# Patient Record
Sex: Male | Born: 1945 | Race: White | Hispanic: No | Marital: Married | Smoking: Never smoker
Health system: Southern US, Community
[De-identification: ages and names within clinical notes are randomized; demographics above are authoritative.]

## PROBLEM LIST (undated history)

## (undated) ENCOUNTER — Emergency Department (HOSPITAL_COMMUNITY): Disposition: A | Discharge status: Home / Self Care | Payer: Medicare Other

## (undated) DIAGNOSIS — E785 Hyperlipidemia, unspecified: Secondary | ICD-10-CM

## (undated) DIAGNOSIS — M549 Dorsalgia, unspecified: Secondary | ICD-10-CM

## (undated) DIAGNOSIS — I2510 Atherosclerotic heart disease of native coronary artery without angina pectoris: Secondary | ICD-10-CM

## (undated) DIAGNOSIS — G8929 Other chronic pain: Secondary | ICD-10-CM

## (undated) DIAGNOSIS — K219 Gastro-esophageal reflux disease without esophagitis: Secondary | ICD-10-CM

## (undated) DIAGNOSIS — C801 Malignant (primary) neoplasm, unspecified: Secondary | ICD-10-CM

## (undated) DIAGNOSIS — Z87440 Personal history of urinary (tract) infections: Secondary | ICD-10-CM

## (undated) DIAGNOSIS — E039 Hypothyroidism, unspecified: Secondary | ICD-10-CM

## (undated) DIAGNOSIS — M1990 Unspecified osteoarthritis, unspecified site: Secondary | ICD-10-CM

## (undated) DIAGNOSIS — R42 Dizziness and giddiness: Secondary | ICD-10-CM

## (undated) DIAGNOSIS — I10 Essential (primary) hypertension: Secondary | ICD-10-CM

## (undated) DIAGNOSIS — K649 Unspecified hemorrhoids: Secondary | ICD-10-CM

## (undated) DIAGNOSIS — F419 Anxiety disorder, unspecified: Secondary | ICD-10-CM

## (undated) DIAGNOSIS — R351 Nocturia: Secondary | ICD-10-CM

## (undated) DIAGNOSIS — Z87898 Personal history of other specified conditions: Secondary | ICD-10-CM

## (undated) DIAGNOSIS — I219 Acute myocardial infarction, unspecified: Secondary | ICD-10-CM

## (undated) DIAGNOSIS — M62838 Other muscle spasm: Secondary | ICD-10-CM

## (undated) DIAGNOSIS — Z87442 Personal history of urinary calculi: Secondary | ICD-10-CM

## (undated) DIAGNOSIS — K5900 Constipation, unspecified: Secondary | ICD-10-CM

## (undated) DIAGNOSIS — I447 Left bundle-branch block, unspecified: Secondary | ICD-10-CM

## (undated) DIAGNOSIS — W19XXXA Unspecified fall, initial encounter: Secondary | ICD-10-CM

## (undated) DIAGNOSIS — F32A Depression, unspecified: Secondary | ICD-10-CM

## (undated) DIAGNOSIS — H269 Unspecified cataract: Secondary | ICD-10-CM

## (undated) DIAGNOSIS — E119 Type 2 diabetes mellitus without complications: Secondary | ICD-10-CM

## (undated) DIAGNOSIS — N189 Chronic kidney disease, unspecified: Secondary | ICD-10-CM

## (undated) DIAGNOSIS — J40 Bronchitis, not specified as acute or chronic: Secondary | ICD-10-CM

## (undated) HISTORY — PX: CORONARY ANGIOPLASTY: SHX604

## (undated) HISTORY — PX: COLONOSCOPY: SHX174

## (undated) HISTORY — PX: OTHER SURGICAL HISTORY: SHX169

## (undated) HISTORY — PX: TONSILLECTOMY: SUR1361

---

## 2000-08-13 ENCOUNTER — Encounter: Payer: Self-pay | Admitting: Emergency Medicine

## 2000-08-13 ENCOUNTER — Emergency Department (HOSPITAL_COMMUNITY): Admission: EM | Admit: 2000-08-13 | Discharge: 2000-08-13 | Payer: Self-pay | Admitting: Emergency Medicine

## 2000-11-15 HISTORY — PX: CORONARY ARTERY BYPASS GRAFT: SHX141

## 2001-02-23 ENCOUNTER — Encounter: Admission: RE | Admit: 2001-02-23 | Discharge: 2001-04-05 | Payer: Self-pay | Admitting: Internal Medicine

## 2001-04-21 ENCOUNTER — Encounter: Payer: Self-pay | Admitting: Cardiology

## 2001-04-21 ENCOUNTER — Inpatient Hospital Stay (HOSPITAL_COMMUNITY): Admission: EM | Admit: 2001-04-21 | Discharge: 2001-05-01 | Payer: Self-pay | Admitting: Emergency Medicine

## 2001-04-24 ENCOUNTER — Encounter: Payer: Self-pay | Admitting: Cardiology

## 2001-04-25 ENCOUNTER — Encounter: Payer: Self-pay | Admitting: Cardiology

## 2001-04-26 ENCOUNTER — Encounter: Payer: Self-pay | Admitting: Cardiothoracic Surgery

## 2001-04-27 ENCOUNTER — Encounter: Payer: Self-pay | Admitting: Cardiothoracic Surgery

## 2001-04-28 ENCOUNTER — Encounter: Payer: Self-pay | Admitting: Cardiothoracic Surgery

## 2001-04-29 ENCOUNTER — Encounter: Payer: Self-pay | Admitting: Cardiothoracic Surgery

## 2001-05-02 ENCOUNTER — Emergency Department (HOSPITAL_COMMUNITY): Admission: EM | Admit: 2001-05-02 | Discharge: 2001-05-02 | Payer: Self-pay | Admitting: Emergency Medicine

## 2001-05-02 ENCOUNTER — Encounter: Payer: Self-pay | Admitting: Emergency Medicine

## 2001-10-23 ENCOUNTER — Encounter: Admission: RE | Admit: 2001-10-23 | Discharge: 2001-11-06 | Payer: Self-pay | Admitting: Internal Medicine

## 2002-03-16 ENCOUNTER — Emergency Department (HOSPITAL_COMMUNITY): Admission: EM | Admit: 2002-03-16 | Discharge: 2002-03-16 | Payer: Self-pay | Admitting: Emergency Medicine

## 2002-03-16 ENCOUNTER — Encounter: Payer: Self-pay | Admitting: Emergency Medicine

## 2002-05-17 ENCOUNTER — Encounter: Payer: Self-pay | Admitting: Cardiology

## 2002-05-17 ENCOUNTER — Ambulatory Visit (HOSPITAL_COMMUNITY): Admission: RE | Admit: 2002-05-17 | Discharge: 2002-05-18 | Payer: Self-pay | Admitting: Cardiology

## 2002-05-17 HISTORY — PX: CARDIAC CATHETERIZATION: SHX172

## 2002-07-02 ENCOUNTER — Ambulatory Visit (HOSPITAL_COMMUNITY): Admission: RE | Admit: 2002-07-02 | Discharge: 2002-07-02 | Payer: Self-pay | Admitting: *Deleted

## 2002-07-02 ENCOUNTER — Encounter (INDEPENDENT_AMBULATORY_CARE_PROVIDER_SITE_OTHER): Payer: Self-pay

## 2002-07-03 ENCOUNTER — Encounter: Payer: Self-pay | Admitting: Internal Medicine

## 2002-07-03 ENCOUNTER — Encounter: Admission: RE | Admit: 2002-07-03 | Discharge: 2002-07-03 | Payer: Self-pay | Admitting: Internal Medicine

## 2002-09-13 ENCOUNTER — Ambulatory Visit (HOSPITAL_COMMUNITY): Admission: RE | Admit: 2002-09-13 | Discharge: 2002-09-13 | Payer: Self-pay | Admitting: *Deleted

## 2003-03-15 ENCOUNTER — Encounter: Payer: Self-pay | Admitting: Internal Medicine

## 2003-03-15 ENCOUNTER — Encounter: Admission: RE | Admit: 2003-03-15 | Discharge: 2003-03-15 | Payer: Self-pay | Admitting: Internal Medicine

## 2005-02-25 ENCOUNTER — Emergency Department (HOSPITAL_COMMUNITY): Admission: EM | Admit: 2005-02-25 | Discharge: 2005-02-25 | Payer: Self-pay | Admitting: Emergency Medicine

## 2009-06-26 ENCOUNTER — Encounter: Admission: RE | Admit: 2009-06-26 | Discharge: 2009-06-26 | Payer: Self-pay | Admitting: Internal Medicine

## 2009-08-12 ENCOUNTER — Ambulatory Visit: Admission: RE | Admit: 2009-08-12 | Discharge: 2009-10-06 | Payer: Self-pay | Admitting: Radiation Oncology

## 2009-10-15 HISTORY — PX: PROSTATECTOMY: SHX69

## 2009-11-13 ENCOUNTER — Encounter (INDEPENDENT_AMBULATORY_CARE_PROVIDER_SITE_OTHER): Payer: Self-pay | Admitting: Urology

## 2009-11-13 ENCOUNTER — Ambulatory Visit (HOSPITAL_COMMUNITY): Admission: RE | Admit: 2009-11-13 | Discharge: 2009-11-14 | Payer: Self-pay | Admitting: Urology

## 2009-11-18 ENCOUNTER — Emergency Department (HOSPITAL_COMMUNITY): Admission: EM | Admit: 2009-11-18 | Discharge: 2009-11-19 | Payer: Self-pay | Admitting: Emergency Medicine

## 2011-01-31 LAB — CBC
HCT: 36.9 % — ABNORMAL LOW (ref 39.0–52.0)
Hemoglobin: 12.3 g/dL — ABNORMAL LOW (ref 13.0–17.0)
MCHC: 33.4 g/dL (ref 30.0–36.0)
MCV: 93.7 fL (ref 78.0–100.0)
Platelets: 187 10*3/uL (ref 150–400)
RBC: 3.94 MIL/uL — ABNORMAL LOW (ref 4.22–5.81)
RDW: 13.6 % (ref 11.5–15.5)
WBC: 5.3 10*3/uL (ref 4.0–10.5)

## 2011-01-31 LAB — POCT I-STAT, CHEM 8
Glucose, Bld: 244 mg/dL — ABNORMAL HIGH (ref 70–99)
HCT: 38 % — ABNORMAL LOW (ref 39.0–52.0)
Hemoglobin: 12.9 g/dL — ABNORMAL LOW (ref 13.0–17.0)
Potassium: 4.4 mEq/L (ref 3.5–5.1)
Sodium: 137 mEq/L (ref 135–145)
TCO2: 19 mmol/L (ref 0–100)

## 2011-01-31 LAB — DIFFERENTIAL
Basophils Relative: 0 % (ref 0–1)
Eosinophils Absolute: 0.1 10*3/uL (ref 0.0–0.7)
Eosinophils Relative: 1 % (ref 0–5)
Monocytes Relative: 8 % (ref 3–12)
Neutrophils Relative %: 84 % — ABNORMAL HIGH (ref 43–77)

## 2011-02-01 ENCOUNTER — Encounter: Payer: BC Managed Care – PPO | Attending: Endocrinology

## 2011-02-01 DIAGNOSIS — Z713 Dietary counseling and surveillance: Secondary | ICD-10-CM | POA: Insufficient documentation

## 2011-02-01 DIAGNOSIS — E119 Type 2 diabetes mellitus without complications: Secondary | ICD-10-CM | POA: Insufficient documentation

## 2011-02-15 LAB — CBC
Hemoglobin: 13.9 g/dL (ref 13.0–17.0)
MCHC: 32.9 g/dL (ref 30.0–36.0)
Platelets: 171 10*3/uL (ref 150–400)
RDW: 14.2 % (ref 11.5–15.5)

## 2011-02-15 LAB — BASIC METABOLIC PANEL
BUN: 23 mg/dL (ref 6–23)
CO2: 24 mEq/L (ref 19–32)
CO2: 24 mEq/L (ref 19–32)
Calcium: 7.9 mg/dL — ABNORMAL LOW (ref 8.4–10.5)
Calcium: 8.3 mg/dL — ABNORMAL LOW (ref 8.4–10.5)
Chloride: 103 mEq/L (ref 96–112)
Creatinine, Ser: 1.58 mg/dL — ABNORMAL HIGH (ref 0.4–1.5)
GFR calc Af Amer: 46 mL/min — ABNORMAL LOW (ref >60)
GFR calc Af Amer: 54 mL/min — ABNORMAL LOW (ref >60)
GFR calc Af Amer: 57 mL/min — ABNORMAL LOW (ref >60)
GFR calc non Af Amer: 38 mL/min — ABNORMAL LOW (ref >60)
GFR calc non Af Amer: 45 mL/min — ABNORMAL LOW (ref >60)
GFR calc non Af Amer: 47 mL/min — ABNORMAL LOW (ref >60)
Glucose, Bld: 229 mg/dL — ABNORMAL HIGH (ref 70–99)
Potassium: 4.5 mEq/L (ref 3.5–5.1)
Potassium: 5.5 mEq/L — ABNORMAL HIGH (ref 3.5–5.1)
Sodium: 130 mEq/L — ABNORMAL LOW (ref 135–145)
Sodium: 135 mEq/L (ref 135–145)
Sodium: 136 mEq/L (ref 135–145)

## 2011-02-15 LAB — TYPE AND SCREEN
ABO/RH(D): A POS
Antibody Screen: NEGATIVE

## 2011-02-15 LAB — COMPREHENSIVE METABOLIC PANEL
Albumin: 4.1 g/dL (ref 3.5–5.2)
BUN: 33 mg/dL — ABNORMAL HIGH (ref 6–23)
Calcium: 9.3 mg/dL (ref 8.4–10.5)
Creatinine, Ser: 1.73 mg/dL — ABNORMAL HIGH (ref 0.4–1.5)
Glucose, Bld: 225 mg/dL — ABNORMAL HIGH (ref 70–99)
Potassium: 6.2 mEq/L — ABNORMAL HIGH (ref 3.5–5.1)
Total Protein: 6.9 g/dL (ref 6.0–8.3)

## 2011-02-15 LAB — HEMOGLOBIN AND HEMATOCRIT, BLOOD
HCT: 32.6 % — ABNORMAL LOW (ref 39.0–52.0)
HCT: 37.1 % — ABNORMAL LOW (ref 39.0–52.0)

## 2011-02-15 LAB — GLUCOSE, CAPILLARY
Glucose-Capillary: 148 mg/dL — ABNORMAL HIGH (ref 70–99)
Glucose-Capillary: 160 mg/dL — ABNORMAL HIGH (ref 70–99)
Glucose-Capillary: 184 mg/dL — ABNORMAL HIGH (ref 70–99)
Glucose-Capillary: 198 mg/dL — ABNORMAL HIGH (ref 70–99)
Glucose-Capillary: 211 mg/dL — ABNORMAL HIGH (ref 70–99)
Glucose-Capillary: 79 mg/dL (ref 70–99)

## 2011-02-15 LAB — ABO/RH: ABO/RH(D): A POS

## 2011-04-02 NOTE — H&P (Signed)
[place]. [facility]  Patient:    Jesus Young, Jesus Young.                  MRN: [id number] Adm. Date:  04/21/01 Attending:  Theodoro Parma. Francina Ames., M.D. Dictator:   Vassie Moment, R.N., A.C.N.P.                         History and Physical  DATE OF BIRTH:  [birth year]  CHIEF COMPLAINT:  Left hand and arm pain.  HISTORY OF PRESENT ILLNESS:  The patient has had chronic left arm and hand pain, mild to moderate.  He is uncertain about how long this has been going on, but certainly more than a year.  It does not occur with activity or at rest predictably and the patient is not awakened at night from sleep with this pain.  There are no associated symptoms such as nausea, sweating, or shortness of breath.  He had an exercise tolerance test today which showed an early double positive result with both EKG changes of at least 2 mm of depression and pain in his arm and across his back.  The patient will be admitted to [facility] through the emergency department for an urgent catheterization.  PAST MEDICAL HISTORY:  Significant for non-insulin-dependent diabetes mellitus, hypertension, gastroesophageal reflux disorder, irritable bowel syndrome, and benign prostatic hypertrophy.  He fell on the ice with some back injury about two years ago.  PAST SURGICAL HISTORY:  Significant for tonsillectomy, negative colonoscopy a few years ago.  ALLERGIES:  TETANUS VACCINE.  MEDICATIONS: 1. Zantac 300 mg one q.d. 2. Cardura 4 mg one q.h.s. 3. Phazyme one b.i.d. 4. Glucovance one at lunch time. 5. Glucophage 500 mg one at supper time.  FAMILY HISTORY:  Significant for his father having died at 98 from a myocardial infarction and his mother dying at 26 from congestive heart failure.  He has one sister alive and well and no children.  SOCIAL HISTORY:  The patient is married.  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  He is right-handed.  He  does not use alcohol.  He does not smoke.  He does not abuse drugs.  REVIEW OF SYSTEMS:  CONSTITUTIONAL:  The patient denies fevers, chills, recent change in weight, or swelling.  He sleeps fairly well.  He has sweating at night.  HEENT:  Eyes:  The patient denies diplopia, contacts, glaucoma, or cataracts.  He does have some blurring and wears glasses.  Ear, nose, mouth, an throat:  The patient denies deafness, tinnitus, rhinorrhea, sneezing, dysgeusia, sores in his mouth, or dentures.  CARDIOVASCULAR:  The patient has pain in his left arm from his elbow to his shoulder and across his upper back. He denies palpitations, dyspnea, paroxysmal nocturnal dyspnea, orthopnea, or claudication.  RESPIRATORY:  The patient denies coughing, sputum production, wheezing, or smoking.  GASTROINTESTINAL:  The patient denies dysphagia, nausea, vomiting, diarrhea, or constipation but he does have frequent indigestion with heartburn.  GENITOURINARY:  The patient denies dysuria, pyuria, hematuria, anuria, or frequency.  He does have hesitation.  He has nocturia x 0-1 at night.  MUSCULOSKELETAL:  The patient has pain in his shoulders, his elbows, and his upper back.  He has myalgias in the biceps of his left upper arm and leg cramps at night.  He does have some fatigue but his gait is steady.  SKIN:  The patient denies rashes or  other skin problems. BREASTS:  The patient denies lumps, tenderness, masses, or discharge in the breasts.  NEUROLOGIC:  The patient denies faintness, syncope, seizures, or the signs or symptoms of a stroke.  Occasionally his lips and fingers get numb and he does get dizzy with his diabetes if his blood sugar is low.  PSYCHIATRIC: The patient denies depression, anorexia, or hallucinations.  He is under a lot of stress.  ENDOCRINE:  The patient denies thyroid disease but he does have diabetes mellitus and occasionally has excessive thirst and hunger. HEMATOLOGIC:  The patient denies that  he bruises or bleeds easily.  LYMPHATIC: The patient denies lymphadenopathy of the neck, axillae, and groin.  ALLERGIC: The patient is allergic to TETANUS VACCINE and denies any other nondrug allergies.  PHYSICAL EXAMINATION  VITAL SIGNS:  The patient is afebrile.  Pulse 120, respirations 18, blood pressure 138/70, weight 162 pounds, height 6 feet 3 inches.  GENERAL:  The patient is a 65 year old well-developed, well-nourished white male in no acute distress.  HEENT:  Pupils are equal, round and reactive to light and 2 mm in diameter. He wears glasses.  He is normocephalic, atraumatic.  Mouth is moist. Oropharynx is benign.  NECK:  Supple.  Midline trachea.  He does not have jugular vein distention, bruit, thyromegaly, or hepatojugular reflux.  CHEST:  The patient is ______.  His respirations are clear to auscultation. Chest is clear to percussion.  BREASTS:  Normal contour without discharge or tenderness.  ADENOPATHY:  The patient is without cervical adenopathy.  CARDIAC:  Regular rate and rhythm.  S1 and S2 are clearly heard.  No murmurs, gallops, rubs, or clicks are auscultated.  ABDOMEN:  The patient does have bowel sounds throughout all quadrants.  He is not distended, not obese, and nontender.  GENITOURINARY:  The patient is nontender over his bladder.  EXTREMITIES:  The patient moves all extremities x 4.  His strength is 5/5 in his upper and lower extremities.  He is without ankle edema.  SKIN:  The patient is warm and dry.  He is without jaundice, cyanosis, pallor, or rashes.  He has a brisk capillary refill.  NEUROLOGIC:  The patient is conscious, alert, and oriented to person, place, time, and situation.  Cranial nerves 2-12 are grossly intact.  PSYCHIATRIC:  The patient is pleasant, cooperative, and responds appropriately.  PSYCHOSOCIAL:  The patient is accompanied to the hospital by his wife.  TEST RESULTS:  On April 21, 2001 the patient had a double  positive exercise tolerance test.  IMPRESSION: 1. Positive exercise tolerance test. 2. Non-insulin-dependent diabetes mellitus. 3. Hypertension.  4. Gastroesophageal reflux disorder.  PLAN: 1. Admit the patient through the emergency room to the catheterization    laboratory for an urgent catheterization. 2. Chest x-ray upon arrival. 3. PT, PTT, CBC, and CMET upon arrival at the hospital. 4. Aspirin 325 mg, Plavix 75 mg, Toprol 50 mg given at the office. 5. O2 and monitor in transport by ambulance to [facility]. DD:  04/22/01 TD:  04/23/01 Job: [id number]

## 2011-04-02 NOTE — Discharge Summary (Signed)
[place]. [facility]  Patient:    Jesus Young, Jesus Young                   MRN: [id number] Adm. Date:  [id number] Disc. Date: 05/01/01 Attending:  Len Childs Dictator:   Lynnell Catalan, P.A. CC:         Minette Brine. Glade Lloyd, M.D.  Len Childs, M.D.   Discharge Summary  DATE OF BIRTH:  [birth year]  ADMISSION DIAGNOSIS:  Coronary artery disease.  SECONDARY DIAGNOSES: 1. Non-insulin-dependent diabetes mellitus. 2. Hypertension. 3. Gastroesophageal reflux disease. 4. Postoperative anemia secondary to blood loss.  DISCHARGE DIAGNOSIS:  Coronary artery disease.  PROCEDURES: 1. Cardiac catheterization. 2. Bilateral carotid duplex. 3. Pre-CABG Dopplers. 4. Coronary bypass graft.  HOSPITAL COURSE:  Mr. Rostad was admitted to [facility] on April 21, 2001, at which time he underwent cardiac catheterization.  This cardiac catheterization revealed the patient had significant coronary artery disease requiring surgical intervention.  The patient was seen and evaluated by Dr. Prescott Gum.  The patient was evaluated by Dr. Prescott Gum, who discussed the risks and benefits of surgery with the patient.  The patient subsequently underwent on April 26, 2001, coronary bypass graft x 5 with left internal mammary artery anastomosed to the left anterior descending artery, left radial artery to the obtuse marginal artery, saphenous vein graft to the ramus artery, and a sequential saphenous vein graft to the posterior descending and posterolateral arteries.  As stated earlier, this was done by Dr. Prescott Gum. No complications were noted during the procedure.  Postoperatively the patient had a hospital course complicated by anemia secondary to blood loss.  This was treated with transfusion of packed red blood cells.  The patient was also started on iron postoperatively secondary to this anemia.  The remainder of the patients postoperative course was  uneventful.  He remained volume overload for several days after surgery.  This was treated with aggressive loop diuretics.  The patient was subsequently discharged home in stable condition on May 01, 2001.  DISCHARGE MEDICATIONS:  1. Tylox 1-2 tablets every four to six hours as needed for pain.  2. Glucophage 500 mg 1 tablet daily.  3. GlucoVance 1 tablet daily.  4. Aspirin 325 mg 1 tablet daily.  5. Toprol XL 25 mg 1 tablet daily.  6. Niferex 150 1 tablet twice daily.  7. Colace 100 mg 1 tablet twice daily.  8. Lasix 40 mg 1 tablet daily for seven days.  9. Potassium chloride 20 mEq 1 tablet daily for seven days. 10. Zantac 300 mg 1 tablet daily.  ACTIVITY:  The patient is to avoid driving, strenuous activity, or lifting heavy objects.  DIET:  1800 calorie ADA diet.  WOUND CARE:  The patient is told he could shower, clean the incisions with soap and water.  DISPOSITION:  Home.  FOLLOW-UP:  The patient was told to follow up with his cardiologist, Dr. Glade Lloyd, in two weeks.  He was instructed to follow up with Dr. Prescott Gum in three weeks.  Call and verify time and date of his appointment.  One hour before his appointment with Dr. Prescott Gum he was instructed to go to [facility] for chest x-ray. DD:  04/30/01 TD:  04/30/01 Job: [id number]

## 2011-04-02 NOTE — Consult Note (Signed)
[place]. [facility]  Patient:    Jesus Young, Jesus Young                   MRN: [id number] Proc. Date: 04/21/01 Adm. Date:  [id number] Attending:  Lisbeth Renshaw CC:         CVTS office  [facility] R. Glade Lloyd, M.D.  Theodoro Parma. Francina Ames., M.D.   Consultation Report  PHYSICIAN REQUESTING CONSULTATION:  Roe Rutherford, M.D.  PRIMARY CARE PHYSICIAN:  Dr. Conley Canal.  REASON FOR CONSULTATION:  Severe two-vessel coronary artery disease.  CHIEF COMPLAINT:  Upper back and shoulder pain.  HISTORY OF PRESENT ILLNESS:  I was asked to see Jesus Young in consultation by Dr. Roe Rutherford for evaluation and treatment of severe two-vessel coronary artery disease, documented by cardiac catheterization this afternoon. The patient is currently stable and comfortable in the catheterization laboratory holding area, where I reviewed the results of the cardiac catheterization with the patient and examined him and discussed the operation with his wife.  The patient has had an indefinite period of time with left shoulder and upper arm stiffness and pain and soreness of the upper back and lower neck.  This has been both at rest and exertional, and he did not relate it to any cardiac problems.  Due to a concern of his primary care physician over coronary disease with his diabetes and a positive family history for CAD, an elective stress test was performed at Dr. Jasmine Pang office today.  This was significantly positive for ischemia, and he was subsequently admitted through the emergency department and underwent urgent cardiac catheterization today by Dr. Roe Rutherford.  This demonstrated severe two-vessel coronary disease with total occlusion of the LAD, 95% stenosis on the right, and 80% of the circumflex vessel.  His ejection fraction was 45%, with left ventricular end-diastolic pressure of 14 mmHg.  The patient was placed on IV heparin after receiving  nitroglycerin, aspirin, and Plavix precatheterization.  The patient denies any prior known history of myocardial infarction, angina, arrhythmia, palpitations, or cardiac problems.  PAST MEDICAL HISTORY: 1. Type 2 diabetes mellitus, on oral medications for four years. 2. Hypertension. 3. BPH. 4. Irritable bowel syndrome.  CURRENT MEDICATIONS: 1. Zantac 200 mg q.d. 2. Cardura 1 tablet q.h.s. 3. Phazyme 1 tablet b.i.d. 4. GlucoVance 1.25/250 at lunchtime. 5. Glucophage 500 mg 1 at suppertime.  ALLERGIES:  TETANUS.  PAST SURGICAL HISTORY: 1. Tonsillectomy as a child. 2. Recent negative colonoscopy.  SOCIAL HISTORY:  The patient is married and works as a Government social research officer for [employer].  He does not smoke or drink alcohol.  FAMILY HISTORY:  His father had heart bypass surgery in his 79s, and there is also an uncle that has had heart bypass surgery.  There is a positive family history of diabetes.  REVIEW OF SYSTEMS:  The patient states he has had nosebleeds as a child, but none for his adult life.  He denies any any bleeding diathesis or easy bruisability.  The patient is right-hand dominant.  He fell on the ice two years ago, with some injury to his right lower leg, which improved with physical therapy, and he did not sustain any fracture.  He denies any history of TIA, CVA, DVT, or claudication.  He denies any problems with diabetic ulcer of his feet or skin.  He denies any diabetic eye problems.  He does not have any dental plates, and he does wear glasses.  Review  of systems is otherwise negative.  PHYSICAL EXAMINATION:  VITAL SIGNS:  Height 6 feet 3 inches, weight 165 pounds.  Blood pressure is 130/70, heart rate is 88 and regular.  He is afebrile, with respirations 18 and unlabored.  GENERAL:  Middle-aged, pleasant white male, comfortable in the catheterization laboratory holding area.  He is anxious to get out of bed.  HEENT:  Normocephalic, full  EOMs, good dentition, pharynx clear.  NECK:  Supple.  Without JVD, thyromegaly, mass, or carotid bruit.  LYMPHATIC:  No palpable adenopathy of the cervical, supraclavicular, or axillary regions.  LUNGS:  Clear to auscultation.  There are no thoracic deformities.  CARDIAC:  Regular rate and rhythm.  Without S3, gallop, murmur, or rub.  ABDOMEN:  Soft and nontender.  Without mass or organomegaly.  EXTREMITIES:  Without clubbing, swollen or tender joints, edema, or deformity. There are no diabetic foot ulcers.  Vascular exam reveals 2+ pulses bilaterally in the radial, ulnar, femoral, and pedal areas.  There is no venous insufficiency of the lower extremities.  RECTAL:  Deferred.  NEUROLOGIC:  Alert and oriented x 3.  With full motor function limited to his bed rest.  He has a dressing over the right groin at the catheterization site, which is without evidence of hematoma or swelling.  SKIN:  Warm, clear, and dry.  LABORATORY DATA:  I reviewed his coronary arteriograms and agree with Dr. Michelle Piper interpretation of severe two-vessel disease.  His precatheterization creatinine was normal, and his precatheterization hematocrit was 44%.  RECOMMENDATION AND PLAN:  The patient would benefit from surgical coronary revascularization with grafts to the LAD, diagonal, OM, and distal right and posterior descending.  I discussed the results of the heart catheterization with the patient and his wife, including indications and expected benefits of coronary bypass surgery.  We will plan on using a mammary artery, radial artery, and vein grafts to construct the bypasses.  I discussed the details of the operation, including the choice of conduit, the location of the surgical  incisions, the use of general anesthesia and cardiopulmonary bypass, and the expected hospital recovery.  I reviewed the risks associated the operation, including the risks of MI, CVA, bleeding, infection, and death.   He understands that there are alternatives to surgical therapy for his coronary artery disease.  He agrees to proceed with the operation as planned, which will be scheduled for the morning of April 26, 2001.  He plans on remaining in the hospital until then.  Thank you very much for this consultation. DD:  04/21/01 TD:  04/21/01 Job: [id number]

## 2011-04-02 NOTE — Cardiovascular Report (Signed)
[place]. [facility]  Patient:    Jesus Young, Jesus Young Visit Number: [id number] MRN: [id number]          Service Type: CAT Location: [id number] Attending Physician:  Lisbeth Renshaw Dictated by:   Minette Brine. Glade Lloyd, M.D. Proc. Date: 05/17/02 Admit Date:  05/17/2002 Discharge Date: 05/18/2002   CC:         [facility]   Cardiac Catheterization  PROCEDURES: 1. Left heart catheterization. 2. Coronary cineangiography. 3. Vein graft cineangiography. 4. Left internal mammary graft cineangiography. 5. Left ventricular cineangiography. 6. Angioplasty with primary stenting of the proximal left anterior descending    artery with a Cypher stent. 7. Perclose of the right femoral artery.  INDICATION FOR PROCEDURES:  This 65 year old male has a history of severe three-vessel coronary artery disease and underwent coronary artery bypass graft surgery on April 26, 2001.  After his surgery, he continued to have left arm and shoulder pain which was similar to the pain he had prior to his surgery, but he has returned to work full time and has been followed up in the office on a periodic basis.  He then developed several episodes of anterior chest pain associated with his arm pain, and a repeat Persantine Cardiolite study showed evidence of anteroseptal ischemia with reversible ischemia shown which was in the distribution of his first anterolateral branch and septal branch.  This area had correlation with his left anterior descending which initially had a proximal severe stenosis prior to the first diagonal and septal branch and then was totally occluded in the middle segment.  We knew that he had a left internal mammary graft to the mid and distal LAD and felt sure that he had been grafted to his first diagonal branch.  After review of his surgical procedure, however, we found that the first diagonal branch had not been grafted, and a  large ramus branch had been grafted.  We then discussed alternatives with the patient several weeks ago, and this week he had an increase in symptoms with increased chest pain and left arm pain on mild exertion.  He was then scheduled for catheterization on Monday; however, he had several episodes today, and we brought him to the hospital early for an urgent catheterization rather than waiting over the holiday and weekend.  PROCEDURE IN DETAIL:  After signing an informed consent, the patient was premedicated with 50 mg of Benadryl intravenously and brought to the cardiac catheterization lab.  His right groin was prepped and draped in a sterile fashion and anesthetized locally with 1% lidocaine.  A 6-French introducer sheath was inserted percutaneously into the right femoral artery, and 6-French, #4 Judkins coronary cathethers were used to make injections into the native coronary arteries.  The right coronary catheter was used to make injections into the two vein grafts, a left radial artery graft, and also into the internal mammary graft to the LAD.  A 6-French pigtail catheter was used to measure pressures in the left ventricle and aorta and to make midstream injections into the left ventricle.  After noting a critical stenosis in his proximal LAD in the segment prior to his first septal and diagonal branches which was then followed by total occlusion of the LAD, and also noting very good grafts and normal function of all of his grafts, we felt that this area involving his septal and diagonal branch closely correlated to the abnormality on his Cardiolite stress test.  After discussing this  finding with the patient, we then elected to proceed with stenting of the proximal LAD lesion.  We selected a 6-French JL4 guide catheter which was advanced to the root of the aorta.  After inserting a short high-torque floppy guidewire into the guide catheter, the tip was engaged in the ostium of the  left coronary artery, and the guidewire was advanced into the LAD.  After mild difficulty, it was directed through the proximal lesion in the LAD and positioned distally in the large septal branch.  After sizing the vessel and lesion, we selected a 3.0 x 13 mm Cypher stent deployment system which was advanced over the guidewire and positioned within the lesion.  The stent was then deployed with one inflation at 18 atmospheres for 40 seconds.  After the stent was deployed, the deployment balloon was removed, and injection again in the left coronary artery showed an excellent angiographic result with 0% residual lesion and no evidence for dissection or clot.  There was normal antegrade flow.  There was no evidence for compromise of the circumflex ramus branch or diagonal or septal branches.  There was very good antegrade flow into the diagonal and septal branches.  The patient tolerated the procedure well, and no complications were noted.  At the end of the procedure, the catheter and sheath were removed from the right femoral artery, and hemostasis was easily obtained with a Perclose closure system.  MEDICATIONS GIVEN:  Versed 1 mg IV x 2, Heparin 5000 units IV, nitroglycerin intracoronary 200 units in the left coronary artery, Integrilin IV drip per pharmacy protocol.  HEMODYNAMIC DATA:  Left ventricular pressure 145/3 to 15, aortic pressure 145/61 with a mean of 100.  Left ventricular ejection fraction 60%.  CINE FINDINGS:  CORONARY CINEANGIOGRAPHY:  Left coronary artery: The ostium and left main appear normal with only mild narrowing at its ostium.  Left anterior descending artery: The proximal LAD has plaque throughout its length with a mild narrowing right after the bifurcation.  There is an eccentric focal 95% stenosis in the proximal segment just before the takeoff of the large first septal branch and the first large diagonal branch.  Just distal to the takeoff of these two  branches, the LAD is then totally occluded.  The middle and distal segment fill by way of internal mammary graft.  The first anterolateral branch has a focal eccentric 40 to 50% stenosis in its proximal segment.  The large septal branch appears normal.  Circumflex coronary artery:  The circumflex has a 75% stenotic lesion in its proximal segment.  The middle and distal segments fill by way of the radial artery graft and also fill antegrade.  Ramus branch: The ramus branch is totally occluded at its origin and fills well by the vein graft.  Right coronary artery: The right coronary artery is totally occluded in its proximal segment.  The mid and distal segment fills by way of vein graft.  VEIN GRAFT CINEANGIOGRAPHY:  Right coronary artery vein graft: This vein graft appears normal with normal proximal and distal anastomotic sites and very good flow into the distal right coronary artery.  Ramus vein graft: The vein graft to the large ramus branch appears normal with normal anastomotic sites and very good flow into the ramus branch.  Left radial artery graft to the obtuse marginal:  This graft appears normal but small with a mild compromise in its distal anastomotic site.  There was very good flow through this small graft which filled retrograde the  entire left coronary system which filled through the left main coronary artery.  Left internal mammary artery graft to the LAD: The LIMA graft appears normal with very good distal anastomotic site in the mid LAD.  The segment of LAD just proximal to this anastomosis is severely diseased for a long segment which compromises several small anterolateral branches and one small septal branch.  LEFT VENTRICULAR CINEANGIOGRAM:  Left ventricular chamber size contractility appear normal.  ANGIOPLASTY CINE:  Cines taken during the angioplasty procedure shows proper positioning of the guidewire and stent deployment system.  Final  injections into the left coronary artery after stent deployment shows an excellent angiographic result with proper positioning of the stent and 0% residual lesion and normal antegrade flow.  There was no evidence for compromise of the side branches.  FINAL DIAGNOSES: 1. A 95% stenosis of the proximal left anterior descending artery resulting in    unprotected first diagonal and first large septal branches which were not    grafted during his surgery in June 2002. 2. Normal-appearing vein grafts to the ramus branch and right coronary artery. 3. Normal function of the left radial artery graft to the obtuse marginal    branch with a mild narrowing in the distal anastomosis. 4. Normal left internal mammary graft to the left anterior descending artery. 5. Normal left ventricular function. 6. Successful primary stenting of the proximal left anterior descending artery    lesion with a Cypher stent. 7. Successful Perclose of the right femoral artery.  DISPOSITION:  The patient was admitted to the [facility] for monitoring overnight and continuation of the Integrilin drip for 18 hours.  Will anticipate discharge in the a.m. Dictated by:   Minette Brine. Glade Lloyd, M.D. Attending Physician:  Lisbeth Renshaw DD:  05/17/02 TD:  05/21/02 Job: [id number]

## 2011-04-02 NOTE — Op Note (Signed)
   TNAMEDERRYN, APA                     ACCOUNT NO.:  [id number]   MEDICAL RECORD NO.:  [id number]                   PATIENT TYPE:  AMB   LOCATION:  ENDO                                 FACILITY:  [facility]   PHYSICIAN:  Waverly Ferrari, M.D.                 DATE OF BIRTH:  1946-01-20   DATE OF PROCEDURE:  DATE OF DISCHARGE:                                 OPERATIVE REPORT   PROCEDURE:  Upper endoscopy.   INDICATIONS FOR PROCEDURE:  Gastroesophageal reflux disease.   ANESTHESIA:  Demerol 50, Versed 4 mg.   DESCRIPTION OF PROCEDURE:  With the patient mildly sedated in the left  lateral decubitus position, the Olympus videoscopic end was inserted in the  mouth and passed under direct vision through the esophagus which appeared  normal, no evidence of Barrett's into the stomach. The fundus, body, antrum,  duodenal bulb and second portion of the duodenum were all visualized and  appeared normal. From this point, the endoscope was slowly withdrawn taking  circumferential views of the entire duodenal mucosa until we pulled back to  the stomach and placed the endoscope in retroflexion to view the stomach  from below. The endoscope was then straightened and withdrawn taking  circumferential views of the remaining gastric and esophageal mucosa  stopping only in the fundus of the stomach where some erythema was seen,  photographed and biopsied. Once removed, the endoscope was withdrawn taking  circumferential views of the remaining gastroesophageal mucosa. The  patient's vital signs and pulse oximeter remained stable. The patient  tolerated the procedure well without apparent complications.   FINDINGS:  Erythema of fundus and duodenal bulb. Await biopsy report. The  patient will call me for results and followup with me as an outpatient.  Proceed to colonoscopy as planned.                                               Waverly Ferrari, M.D.    GMO/MEDQ  D:  07/02/2002  T:  07/02/2002   Job:  [id number]

## 2011-04-02 NOTE — Op Note (Signed)
[place]. [facility]  Patient:    Jesus Young, Jesus Young                   MRN: [id number] Proc. Date: 04/26/01 Adm. Date:  [id number] Attending:  Len Childs                           Operative Report  PROCEDURE:  Coronary artery bypass grafting x 5 (left internal mammary artery to the left anterior descending artery, saphenous vein graft to ramus intermedius, left radial artery graft to obtuse marginal, sequential saphenous vein graft to right coronary artery and posterior descending).  PREOPERATIVE DIAGNOSIS:  Class III progressive angina with severe three-vessel coronary disease and a positive stress test.  POSTOPERATIVE DIAGNOSIS:  Class III progressive angina with severe three-vessel coronary disease and a positive stress test.  SURGEON:  Len Childs, M.D.  ASSISTANT:  Sheliah Hatch, P.A.-C.  ANESTHESIA:  General.  INDICATIONS:  The patient is a 65 year old Type 2 diabetic who presented with progressive decrease in exercise tolerance and exertional chest tightness.  A stress test was early positive, and he underwent cardiac catheterization by Dr. Roe Rutherford.  This demonstrated severe three-vessel coronary disease including total occlusion of the LAD, a 90% stenosis of the circumflex, an 80% stenosis of right coronary with ejection fraction of 45%.  He was referred for a surgical coronary revascularization due to his severe three-vessel disease. Prior to the operation, I discussed the indications and expected benefits of coronary bypass surgery with the patient and his wife.  I discussed the major aspects of the operation including the choice of conduit including the use of radial artery, the location of the surgical incisions, the use of general anesthesia and cardiopulmonary bypass, and the expected postoperative recovery.  I reviewed the risks associated with this operation to the patient including risks of MI, CV, bleeding,  infection, and death.  He understood the alternatives to surgical therapy for his coronary disease.  He agreed to proceed with the operation as planned under informed consent.  OPERATIVE FINDINGS:  The patients LAD was chronically occluded and was a suboptimal target for grafting.  The circumflex was high in the AV groove. The right coronary was a large dominant vessel.  The saphenous vein was of average to above average quality.  The radial artery was a nondiseased, good conduit.  DESCRIPTION OF PROCEDURE:  The patient was brought to the operating room and placed supine on the operating room table where general anesthesia was induced under invasive hemodynamic monitoring.  The chest, abdomen, legs, and left arm were prepped and draped as a sterile field.  First, the left radial artery was harvested as a free graft from the forearm after documenting a patent palmar arch with a normal distal pulse present in the radial artery after occlusion of the vessel.  The vessel was cannulated and flushed with papaverine and heparin, and the incision was closed.  A sternal incision was then made, and the vein was harvested from the right leg.  The internal mammary artery was harvested from the left side from its origin at the subclavian vessels and was a good vessel with excellent flow.  It was small, 1.5 to 1.8 mm in diameter. The patient was given a calculated dose of heparin, and the ACT was determined and documented to be therapeutic.  Pursestrings were placed in the ascending aorta and right atrium, and the patient was  cannulated and placed on bypass and cooled to 28 degrees.  The coronaries were identified, and the mammary artery, radial artery, and vein grafts were prepared for the distal anastomoses.  A cardioplegic cannula was placed.  As the aortic crossclamp was applied, 600 cc of cold blood cardioplegia was delivered to the aortic root with an immediate cardioplegic ______ the temperature  ______ less than than 12 degrees.  Topical icing and flush will be used to augment myocardial preservation, and a pericardial insulator pad was used to protect the left phrenic nerve.  The distal coronary anastomoses were then performed.  The first distal anastomosis was to the distal right coronary.  This is a 1.8-mm vessel with a proximal 80% stenosis, and a side branch of the vein was sewn side-to-side with a running 7-0 Prolene with good flow to the graft.  The second distal anastomosis was the continuation of this sequential vein graft to the posterior descending.  This is a 1.5-mm vessel with a proximal 70% stenosis at its origin, and the end of the vein was sewn end-to-side with a running 7-0 Prolene with good flow through the graft.  Cardioplegia was re-dosed through the vein.  The third distal anastomosis was the ramus intermedius which was a 1.5-mm vessel with a proximal 80% stenosis.  A reverse saphenous vein was sewn end-to-side with a running 7-0 Prolene.  There was good flow through the graft.  A fourth distal anastomosis was of the obtuse marginal which had a proximal 90% stenosis.  The left radial artery free graft was then sewn end-to-side with a running 8-0 Prolene, and there was good flow through the graft.  A fifth distal anastomosis was to the distal LAD which is totally occluded proximally.  The left internal mammary artery pedicle was brought through an opening created in the left lateral pericardium.  It was brought down on the LAD and sewn end-to-side with a running 8-0 Prolene.  There was good flow through the anastomosis with immediate rise in septal temperature after release of the pedicle clamp on the mammary artery.  The mammary pedicle was secured to the epicardium, and the aorta crossclamp was removed.  The heart resumed a spontaneous rhythm.  The patient was rewarmed and reperfused.  A partial occluding clamp was placed on the ascending aorta, and three  proximal anastomoses were placed using the radial artery as the superior proximal anastomosis followed by the two vein grafts.  The partial clamp was removed, and the grafts were perfused.  Each had excellent flow, and  hemostasis was documented at the proximal and distal sites.  The patient was rewarmed, and temporary pacing wires were applied.  The lungs reexpanded, and the ventilator was resumed.  The patient was weaned from bypass without difficulty.  Blood pressure and cardiac output were stable.  Protamine was administered, and the cannulas were removed.  The mediastinum was irrigated with warm antibiotic irrigation.  The leg incision was irrigated and closed in a standard fashion.  The pericardium was loosely reapproximated over the aorta and vein grafts.  The mediastinum was drained with two chest tubes, and the left pleural space was drained with a third chest tube which were brought out through separate incisions.  The sternum was reapproximated with interrupted steel wire.  The pectoralis fascia was closed with interrupted #1 Vicryl.  The subcutaneous and skin were closed with a running Vicryl, and sterile dressings were applied.  Total cardiopulmonary bypass time was 120 minutes with aorta crossclamp time of  80 minutes. DD:  04/26/01 TD:  04/26/01 Job: [id number]

## 2011-04-02 NOTE — Op Note (Signed)
   Jesus Young, Jesus Young                     ACCOUNT NO.:  [id number]   MEDICAL RECORD NO.:  [id number]                   PATIENT TYPE:  AMB   LOCATION:  ENDO                                 FACILITY:  [facility]   PHYSICIAN:  Waverly Ferrari, M.D.                 DATE OF BIRTH:  May 27, 1946   DATE OF PROCEDURE:  DATE OF DISCHARGE:                                 OPERATIVE REPORT   PROCEDURE:  Colonoscopy.   INDICATIONS FOR PROCEDURE:  Colon polyp.   ANESTHESIA:  Additional Demerol 25, Versed 2 mg.   DESCRIPTION OF PROCEDURE:  With the patient mildly sedated in the left  lateral decubitus position, a rectal exam was performed which was  unremarkable. Subsequently, the Olympus videoscopic colonoscope was inserted  in the rectum and passed under direct vision to the cecum, identified by the  ileocecal valve and appendiceal orifice both of which were photographed.  From this point, the colonoscope was slowly withdrawn taking circumferential  views of the entire colonic mucosa stopping only in the rectum which  appeared normal on direct and retroflexed viewed. The endoscope was  straightened and withdrawn. The patient's vital signs and pulse oximeter  remained stable. The patient tolerated the procedure well without apparent  complications.   FINDINGS:  Unremarkable examination.   PLAN:  Repeat in possibly five years.                                               Waverly Ferrari, M.D.    GMO/MEDQ  D:  07/02/2002  T:  07/02/2002  Job:  [id number]

## 2011-04-02 NOTE — Cardiovascular Report (Signed)
[facility]  Patient:    Jesus, Young                   MRN: [id number] Proc. Date: 04/21/01 Adm. Date:  [id number] Attending:  Lisbeth Renshaw CC:         Theodoro Parma. Francina Ames., M.D.  Cardiac Catheterization Laboratory   Cardiac Catheterization  REFERRING PHYSICIAN:  Theodoro Parma. Francina Ames., M.D.  PROCEDURES: 1. Left heart catheterization, emergent. 2. Coronary cineangiography. 3. Left internal mammary artery cineangiography. 4. Left ventricular cineangiography. 5. Abdominal aortogram. 6. Perclose of the right femoral artery.  INDICATIONS FOR PROCEDURE:  This 65 year old male presented to Dr. Conley Canal with recent acceleration in exertional chest discomfort and decreasing exertional tolerance.  He underwent a treadmill exercise test today in the office which was early double positive with marked 4 and 5 mm ST depression,. and severe anterior chest pain which was fairly difficult to relieve with sublingual nitroglycerin.  The chest pain was unrelieved after 3 or 4 sublingual nitroglycerin and an emergent cardiac catheterization was scheduled here at [facility].  He has a strong family history of coronary artery disease and peripheral vascular disease.  DESCRIPTION OF PROCEDURE:  After signing an informed consent, the patient was transported from Dr. Jasmine Pang office to the cardiac catheterization lab at [facility], where his right groin was prepped and draped in a sterile fashion.  His right groin was anesthetized locally with 1% lidocaine.  A 6 French introducer sheath was inserted percutaneously into the right femoral artery.  A 6 French #4 Judkins coronary catheters were used to make injections into the coronary arteries.  The right coronary catheter was used to make a mid stream injection into the left subclavian artery visualizing the left internal mammary artery.  A 6 French pigtail catheter was used to  measure pressures in the left ventricle and aorta and to make mid stream injections into the left ventricle and abdominal aorta.  The patient tolerated the procedure well and no complications were noted.  At the end of the procedure, the catheter and sheath were removed from the right femoral artery and hemostasis was easily obtained with a Perclose closure system.  MEDICATIONS GIVEN:  Versed 2 mg IV.  HEMODYNAMIC DATA:  Left ventricular pressure 126/0-16, aortic pressure 125/69 with a mean of 90.  Left ventricular ejection fraction 45%.  CINE FINDINGS:  CORONARY CINEANGIOGRAPHY:  Left coronary artery:  The ostium appears normal. The mid and distal segment in the left main appears to be narrowed with a tapering off the left main into bifurcation causing a 50% stenosis. This plaque extends into the bifurcation and LAD and circumflex.  Left anterior descending:  The proximal LAD has a concentric focal plaque causing a 70% stenosis.  This is followed by a large septal branch and a medium sized anterolateral branch.  Just distal to the first large septal branch the LAD is totally occluded and there is a long segment of nonvisualization.  The mid to distal segment and distal LAD fill by way of collaterals with fairly good filling showing a normal-appearing LAD at the apex.  A second anterolateral branch fills retrograde during injections into the right coronary artery.  Circumflex coronary artery:  The circumflex has a segmental narrowing in its proximal segment causing a 70-80% stenosis.  There is antegrade flow. The large first obtuse marginal branch has a focal 50% stenosis in its proximal segment.  The remainder of this large  obtuse marginal branch appears normal with a large bifurcation distal to the lesion.  Right coronary artery:  The right coronary artery is a large dominant vessel supplying the posterior descending and posterolateral circulation.  The ostium appears normal.   There is a focal eccentric critical stenosis of 95% in the middle segment between the right ventricular branch and the acute marginal branch.  The distal segment extending to the crux appears normal.  The takeoff of the posterior descending has a focal 50-60% stenosis.  Left internal mammary artery appears normal.  LEFT VENTRICULAR CINEANGIOGRAM:  The left ventricular chamber size appears normal.  The overall left ventricular contractility is mildly decreased with moderate apical hypokinesia.  The inferior segment and the anterobasilar segment has normal contractility.  The mitral and aortic valves appear normal.  The ejection fraction was measured at 45%.  ABDOMINAL AORTOGRAM:  Mid stream injection in the abdominal aorta revealed a normal-appearing abdominal aorta with normal renal arteries.  FINAL DIAGNOSES: 1. Severe critical three-vessel coronary artery disease with totally    occluded left anterior descending, 95% focal right coronary artery    lesion, 70-80% stenosis of the proximal circumflex and 50% distal    left main disease. 2. Normal left internal mammary artery. 3. Mild left ventricular dysfunction, ejection fraction 45% with apical    hypokinesia. 4. Normal mitral and aortic valves. 5. Normal abdominal aorta and renal arteries. 6. Successful Perclose of the right femoral artery.  DISPOSITION:  Will admit to the stepdown unit for further monitoring to stabilize him over the weekend for anticipated surgery on Monday.  Will contact [facility] regarding coronary artery bypass graft surgery.  I have explained this to the patient and he shows a very high level of understanding. DD:  04/21/01 TD:  04/22/01 Job: [id number]

## 2012-09-05 ENCOUNTER — Other Ambulatory Visit: Payer: Self-pay | Admitting: Physician Assistant

## 2012-10-26 ENCOUNTER — Other Ambulatory Visit: Payer: Self-pay | Admitting: Dermatology

## 2012-11-09 ENCOUNTER — Other Ambulatory Visit: Payer: Self-pay | Admitting: Physician Assistant

## 2013-01-22 ENCOUNTER — Telehealth (INDEPENDENT_AMBULATORY_CARE_PROVIDER_SITE_OTHER): Payer: Self-pay | Admitting: General Surgery

## 2013-01-22 NOTE — Telephone Encounter (Signed)
Faxed back to [place] to not Refill meds patient will need a apt

## 2013-02-20 ENCOUNTER — Other Ambulatory Visit: Payer: Self-pay | Admitting: Physician Assistant

## 2013-06-14 ENCOUNTER — Other Ambulatory Visit: Payer: Self-pay | Admitting: Orthopedic Surgery

## 2013-06-14 NOTE — Progress Notes (Signed)
Need orders in EPIC.  Surgery scheduled for 06/21/13.

## 2013-06-14 NOTE — H&P (Signed)
Jesus Young is an 67 y.o. male.   Chief Complaint: back and leg pain HPI: The patient is a 67 year old male who presents with back pain. The patient reports low back symptoms including pain, numbness (right foot) and right leg pain which began 4 week(s) ago in association with a change in activity (began after he increased his walking for exercise). The patient describes the severity of their symptoms as mild. Symptoms are exacerbated by sitting, while symptoms are not exacerbated by recumbency. Refractory to nonsteroidal anti-inflammatory drugs, opioid analgesics and muscle relaxants, corticosteroids.   PMH Prostate dz Prostate CA Skin CA Hypercholesterolemia HTN MI Nephrolithiasis DM type 2 CAD  PSH Tonsillectomy Abdominal prostatectomy Cardiac stents CABG   FH CHF (mother) CA (paternal grandfather) Heart Dz (father, paternal side)  Social History: never smoker, never alcohol  Allergies: tetanus toxoid  Review of Systems  Constitutional: Negative.   HENT: Negative.   Eyes: Negative.   Respiratory: Negative.   Cardiovascular: Negative.   Gastrointestinal: Negative.   Genitourinary: Negative.   Musculoskeletal: Positive for back pain.  Skin: Negative.   Neurological: Positive for sensory change and focal weakness.  Psychiatric/Behavioral: Negative.     There were no vitals taken for this visit. Physical Exam  Constitutional: He is oriented to person, place, and time. He appears well-developed and well-nourished. He appears distressed.  HENT:  Head: Normocephalic and atraumatic.  Eyes: Conjunctivae and EOM are normal. Pupils are equal, round, and reactive to light.  Neck: Normal range of motion. Neck supple.  Cardiovascular: Normal rate and regular rhythm.   Respiratory: Effort normal and breath sounds normal.  GI: Soft. Bowel sounds are normal.  Musculoskeletal:  On exam he is on moderately severe distress. He walks with an antalgic gait with a crutch.  Mood and affect are anxious. He is tender in the right proximal gluteus. He has limited flexion and extension. Straight leg raise produces buttock, thigh, and calf pain exacerbated with dorsal augmentation maneuver. He has EHL 4/5. Decreased sensation in the L5 dermatome.  Lumbar spine exam reveals no evidence of soft tissue swelling, no evidence of soft tissue swelling or deformity or skin ecchymosis. On palpation there is no tenderness of the lumbar spine. No flank pain with percussion. The abdomen is soft and nontender. Nontender over the trochanters. No cellulitis or lymphadenopathy.  Motor is 5/5 including tibialis anterior, plantarflexion, quadriceps and hamstrings. The patient is normoreflexic. There is no Babinski or clonus. The patient has good distal pulses. No DVT. No pain and normal range of motion without instability of the hips, knees and ankles.  Inspection of the cervical spine reveals a normal lordosis without evidence of paraspinous spasms or soft tissue swelling. Nontender to palpation. Full flexion, full extension, full left and right lateral rotation. Extension combined with lateral flexion does not reproduce pain. Negative impingement sign, negative secondary impingement sign of the shoulders. Negative Tinel's at median and ulnar nerves at the elbow. Negative carpal compression test at the wrists. Motor of the upper extremities is 5/5 including biceps, triceps, brachioradialis,wrist flexion, wrist extension, finger flexion, finger extension. Reflexes are normoreflexic. Sensory exam is intact to light touch. There is no Hoffmann sign. Nontender over the thoracic spine.    Neurological: He is oriented to person, place, and time. He has normal reflexes.  Skin: Skin is warm and dry.     Three view radiographs of the lumbar spine demonstrates spondylosis at 4-5 and 5-1. No instability in flexion and extension.  MRI from  7/22 demonstrates disc herniation paracentral to the  right with mass effect on the L5 nerve root right and left. He has bimodal disc herniation.  Assessment/Plan Severe L5 radiculopathy, myotomal weakness, dermatomal dysesthesia secondary to disc herniation at 4-5, refractory to rest and activity modifications  I had an extensive discussion with the patient concerning current pathology, relevant anatomy, and treatment options. Certainly in the presence of a neurocompressive lesion, neurological deficit, and significant pain, proceeding with lumbar decompression is appropriate.  I had an extensive discussion of the risks and benefits of lumbar decompression with the patient including bleeding, infection, damage to neurovascular structures, epidural fibrosis, CSF leakage requiring repair. We also discussed increase in pain, adjacent segment disease, recurrent disc herniation, need for future surgery including repeat decompression and/or fusion. We also discussed risks of postoperative hematoma, paralysis, anesthetic complications including DVT, PE, death, cardiopulmonary dysfunction. In addition, the perioperative and postoperative courses were discussed in detail including the rehabilitative time and return to functional activity and work. I provided the patient with an illustrated handout and utilized the appropriate surgical models.  However, he reports some slight improvement since last week. Therefore, we will have him see in physical therapy for a visit for McKenzie approach. If he has dramatic improvement in his symptoms we can hold off on surgical intervention. However, he would like to proceed with that at this point and it is certainly reasonable given his examination. If he is worse in the interim he is to call. Continue with Percocet and Robaxin. I would not recommend an epidural given that he is insulin dependent diabetic. In the interim we will proceed with preoperative clearance by his medical physician. I appreciate the kind referral  by Dr. Esmond Plants.  Plan microlumbar decompression L4-5  BISSELL, JACLYN M. for Dr. Tonita Cong 06/14/2013, 11:32 AM

## 2013-06-19 ENCOUNTER — Other Ambulatory Visit (HOSPITAL_COMMUNITY): Payer: BC Managed Care – PPO

## 2013-06-21 ENCOUNTER — Ambulatory Visit (HOSPITAL_COMMUNITY): Admission: RE | Admit: 2013-06-21 | Payer: Medicare Other | Source: Ambulatory Visit | Admitting: Specialist

## 2013-06-21 ENCOUNTER — Encounter (HOSPITAL_COMMUNITY): Admission: RE | Payer: Self-pay | Source: Ambulatory Visit

## 2013-06-21 SURGERY — LUMBAR LAMINECTOMY/DECOMPRESSION MICRODISCECTOMY 1 LEVEL
Anesthesia: General

## 2013-07-03 ENCOUNTER — Other Ambulatory Visit: Payer: Self-pay | Admitting: Neurosurgery

## 2013-07-10 ENCOUNTER — Encounter (HOSPITAL_COMMUNITY): Payer: Self-pay | Admitting: Pharmacy Technician

## 2013-07-12 ENCOUNTER — Encounter (HOSPITAL_COMMUNITY)
Admission: RE | Admit: 2013-07-12 | Discharge: 2013-07-12 | Disposition: A | Discharge status: Home / Self Care | Payer: Medicare Other | Source: Ambulatory Visit | Attending: Neurosurgery | Admitting: Neurosurgery

## 2013-07-12 ENCOUNTER — Encounter (HOSPITAL_COMMUNITY)
Admission: RE | Admit: 2013-07-12 | Discharge: 2013-07-12 | Disposition: A | Discharge status: Home / Self Care | Payer: Medicare Other | Source: Ambulatory Visit | Attending: Anesthesiology | Admitting: Anesthesiology

## 2013-07-12 ENCOUNTER — Encounter (HOSPITAL_COMMUNITY): Payer: Self-pay

## 2013-07-12 DIAGNOSIS — Z01812 Encounter for preprocedural laboratory examination: Secondary | ICD-10-CM | POA: Insufficient documentation

## 2013-07-12 DIAGNOSIS — Z01818 Encounter for other preprocedural examination: Secondary | ICD-10-CM | POA: Insufficient documentation

## 2013-07-12 HISTORY — DX: Other chronic pain: G89.29

## 2013-07-12 HISTORY — DX: Type 2 diabetes mellitus without complications: E11.9

## 2013-07-12 HISTORY — DX: Essential (primary) hypertension: I10

## 2013-07-12 HISTORY — DX: Constipation, unspecified: K59.00

## 2013-07-12 HISTORY — DX: Unspecified osteoarthritis, unspecified site: M19.90

## 2013-07-12 HISTORY — DX: Dorsalgia, unspecified: M54.9

## 2013-07-12 HISTORY — DX: Unspecified hemorrhoids: K64.9

## 2013-07-12 HISTORY — DX: Malignant (primary) neoplasm, unspecified: C80.1

## 2013-07-12 HISTORY — DX: Hypothyroidism, unspecified: E03.9

## 2013-07-12 HISTORY — DX: Personal history of urinary calculi: Z87.442

## 2013-07-12 HISTORY — DX: Hyperlipidemia, unspecified: E78.5

## 2013-07-12 HISTORY — DX: Other muscle spasm: M62.838

## 2013-07-12 HISTORY — DX: Unspecified cataract: H26.9

## 2013-07-12 HISTORY — DX: Atherosclerotic heart disease of native coronary artery without angina pectoris: I25.10

## 2013-07-12 HISTORY — DX: Acute myocardial infarction, unspecified: I21.9

## 2013-07-12 HISTORY — DX: Gastro-esophageal reflux disease without esophagitis: K21.9

## 2013-07-12 LAB — BASIC METABOLIC PANEL
BUN: 22 mg/dL (ref 6–23)
Calcium: 9.4 mg/dL (ref 8.4–10.5)
GFR calc Af Amer: 51 mL/min — ABNORMAL LOW (ref >90)
GFR calc non Af Amer: 44 mL/min — ABNORMAL LOW (ref >90)
Potassium: 4.9 mEq/L (ref 3.5–5.1)
Sodium: 139 mEq/L (ref 135–145)

## 2013-07-12 LAB — CBC
MCH: 31.7 pg (ref 26.0–34.0)
MCHC: 33.9 g/dL (ref 30.0–36.0)
RDW: 13.6 % (ref 11.5–15.5)

## 2013-07-12 LAB — SURGICAL PCR SCREEN
MRSA, PCR: NEGATIVE
Staphylococcus aureus: NEGATIVE

## 2013-07-12 NOTE — Progress Notes (Addendum)
Dr.Ganji is cardiologist with last visit a week ago-to request report  Stress test in Feb 2014-to request from [facility]  Echo to be requested from [facility] cath report in epic from 05/17/02  Medical MD is Dr.Ramachandry  EKG done a week ago-to be requested from DR.GAnji  No CXR in past yr

## 2013-07-12 NOTE — Pre-Procedure Instructions (Signed)
XANG BROADEN  07/12/2013   Your procedure is scheduled on:  Wed, Sept 3 @ 10:00 AM  Report to [facility] at 7:00 AM.  Call this number if you have problems the morning of surgery: [phone]   Remember:   Do not eat food or drink liquids after midnight.   Take these medicines the morning of surgery with A SIP OF WATER:Pain Pill(if needed) and Zantac(Ranitidine)               Stop taking your Aspirin,Plavix,Celebrex,and Robaxin.No Goody's,BC's,Aleve,Ibuprofen,Fish Oil,or any Herbal Medications                 Do not wear jewelry  Do not wear lotions, powders, or colognes. You may wear deodorant.  Men may shave face and neck.  Do not bring valuables to the hospital.  [facility] is not responsible                   for any belongings or valuables.  Contacts, dentures or bridgework may not be worn into surgery.  Leave suitcase in the car. After surgery it may be brought to your room.  For patients admitted to the hospital, checkout time is 11:00 AM the day of  discharge.     Special Instructions: Shower using CHG 2 nights before surgery and the night before surgery.  If you shower the day of surgery use CHG.  Use special wash - you have one bottle of CHG for all showers.  You should use approximately 1/3 of the bottle for each shower.   Please read over the following fact sheets that you were given: Pain Booklet, Coughing and Deep Breathing, MRSA Information and Surgical Site Infection Prevention

## 2013-07-13 NOTE — Progress Notes (Signed)
Requested echo, stress, office visit and ekg from Dr Irven Shelling office placed in chart.

## 2013-07-17 MED ORDER — CEFAZOLIN SODIUM-DEXTROSE 2-3 GM-% IV SOLR
2.0000 g | INTRAVENOUS | Status: AC
  Administered 2013-07-18: 2 g via INTRAVENOUS

## 2013-07-18 ENCOUNTER — Ambulatory Visit (HOSPITAL_COMMUNITY): Payer: Medicare Other | Admitting: Anesthesiology

## 2013-07-18 ENCOUNTER — Ambulatory Visit (HOSPITAL_COMMUNITY): Payer: Medicare Other

## 2013-07-18 ENCOUNTER — Encounter (HOSPITAL_COMMUNITY): Payer: Self-pay | Admitting: Anesthesiology

## 2013-07-18 ENCOUNTER — Encounter (HOSPITAL_COMMUNITY): Payer: Self-pay | Admitting: *Deleted

## 2013-07-18 ENCOUNTER — Observation Stay (HOSPITAL_COMMUNITY)
Admission: RE | Admit: 2013-07-18 | Discharge: 2013-07-19 | Disposition: A | Discharge status: Home / Self Care | Payer: Medicare Other | Source: Ambulatory Visit | Attending: Neurosurgery | Admitting: Neurosurgery

## 2013-07-18 ENCOUNTER — Encounter (HOSPITAL_COMMUNITY)
Admission: RE | Disposition: A | Discharge status: Home / Self Care | Payer: Self-pay | Source: Ambulatory Visit | Attending: Neurosurgery

## 2013-07-18 DIAGNOSIS — N289 Disorder of kidney and ureter, unspecified: Secondary | ICD-10-CM | POA: Insufficient documentation

## 2013-07-18 DIAGNOSIS — Z951 Presence of aortocoronary bypass graft: Secondary | ICD-10-CM | POA: Insufficient documentation

## 2013-07-18 DIAGNOSIS — M5137 Other intervertebral disc degeneration, lumbosacral region: Secondary | ICD-10-CM | POA: Insufficient documentation

## 2013-07-18 DIAGNOSIS — I252 Old myocardial infarction: Secondary | ICD-10-CM | POA: Insufficient documentation

## 2013-07-18 DIAGNOSIS — I2510 Atherosclerotic heart disease of native coronary artery without angina pectoris: Secondary | ICD-10-CM | POA: Insufficient documentation

## 2013-07-18 DIAGNOSIS — E039 Hypothyroidism, unspecified: Secondary | ICD-10-CM | POA: Insufficient documentation

## 2013-07-18 DIAGNOSIS — K219 Gastro-esophageal reflux disease without esophagitis: Secondary | ICD-10-CM | POA: Insufficient documentation

## 2013-07-18 DIAGNOSIS — I10 Essential (primary) hypertension: Secondary | ICD-10-CM | POA: Insufficient documentation

## 2013-07-18 DIAGNOSIS — M51379 Other intervertebral disc degeneration, lumbosacral region without mention of lumbar back pain or lower extremity pain: Secondary | ICD-10-CM | POA: Insufficient documentation

## 2013-07-18 DIAGNOSIS — M47817 Spondylosis without myelopathy or radiculopathy, lumbosacral region: Principal | ICD-10-CM | POA: Insufficient documentation

## 2013-07-18 DIAGNOSIS — M5126 Other intervertebral disc displacement, lumbar region: Secondary | ICD-10-CM | POA: Insufficient documentation

## 2013-07-18 HISTORY — PX: LUMBAR LAMINECTOMY/DECOMPRESSION MICRODISCECTOMY: SHX5026

## 2013-07-18 LAB — GLUCOSE, CAPILLARY
Glucose-Capillary: 141 mg/dL — ABNORMAL HIGH (ref 70–99)
Glucose-Capillary: 200 mg/dL — ABNORMAL HIGH (ref 70–99)
Glucose-Capillary: 216 mg/dL — ABNORMAL HIGH (ref 70–99)

## 2013-07-18 SURGERY — LUMBAR LAMINECTOMY/DECOMPRESSION MICRODISCECTOMY 1 LEVEL
Anesthesia: General | Site: Back | Laterality: Bilateral | Wound class: Clean

## 2013-07-18 MED ORDER — MENTHOL 3 MG MT LOZG: 1.0000 | LOZENGE | OROMUCOSAL | Status: DC | PRN

## 2013-07-18 MED ORDER — VECURONIUM BROMIDE 10 MG IV SOLR
INTRAVENOUS | Status: DC | PRN
  Administered 2013-07-18: 1 mg via INTRAVENOUS

## 2013-07-18 MED ORDER — HYDROCODONE-ACETAMINOPHEN 5-325 MG PO TABS
1.0000 | ORAL_TABLET | ORAL | Status: DC | PRN
  Administered 2013-07-18: 1 via ORAL
  Filled 2013-07-18: qty 2

## 2013-07-18 MED ORDER — LIDOCAINE-EPINEPHRINE 1 %-1:100000 IJ SOLN
INTRAMUSCULAR | Status: DC | PRN
  Administered 2013-07-18: 10 mL

## 2013-07-18 MED ORDER — PANTOPRAZOLE SODIUM 40 MG PO TBEC
40.0000 mg | DELAYED_RELEASE_TABLET | Freq: Every day | ORAL | Status: DC
  Administered 2013-07-18: 40 mg via ORAL
  Filled 2013-07-18: qty 1

## 2013-07-18 MED ORDER — INSULIN ASPART 100 UNIT/ML ~~LOC~~ SOLN
8.0000 [IU] | Freq: Every day | SUBCUTANEOUS | Status: DC
  Administered 2013-07-18: 8 [IU] via SUBCUTANEOUS

## 2013-07-18 MED ORDER — BISACODYL 10 MG RE SUPP: 10.0000 mg | Freq: Every day | RECTAL | Status: DC | PRN

## 2013-07-18 MED ORDER — SIMVASTATIN 40 MG PO TABS
40.0000 mg | ORAL_TABLET | Freq: Every evening | ORAL | Status: DC
  Administered 2013-07-18: 40 mg via ORAL
  Filled 2013-07-18 (×2): qty 1

## 2013-07-18 MED ORDER — INSULIN DETEMIR 100 UNIT/ML ~~LOC~~ SOLN
10.0000 [IU] | Freq: Every day | SUBCUTANEOUS | Status: DC
  Administered 2013-07-18: 10 [IU] via SUBCUTANEOUS
  Filled 2013-07-18 (×2): qty 0.1

## 2013-07-18 MED ORDER — FENTANYL CITRATE 0.05 MG/ML IJ SOLN
INTRAMUSCULAR | Status: DC | PRN
  Administered 2013-07-18: 100 ug via INTRAVENOUS
  Administered 2013-07-18: 50 ug via INTRAVENOUS

## 2013-07-18 MED ORDER — NEOSTIGMINE METHYLSULFATE 1 MG/ML IJ SOLN
INTRAMUSCULAR | Status: DC | PRN
  Administered 2013-07-18: 4 mg via INTRAVENOUS

## 2013-07-18 MED ORDER — ZOLPIDEM TARTRATE 5 MG PO TABS: 5.0000 mg | ORAL_TABLET | Freq: Every evening | ORAL | Status: DC | PRN

## 2013-07-18 MED ORDER — ONDANSETRON HCL 4 MG/2ML IJ SOLN: 4.0000 mg | Freq: Four times a day (QID) | INTRAMUSCULAR | Status: DC | PRN

## 2013-07-18 MED ORDER — ROCURONIUM BROMIDE 100 MG/10ML IV SOLN
INTRAVENOUS | Status: DC | PRN
  Administered 2013-07-18: 50 mg via INTRAVENOUS

## 2013-07-18 MED ORDER — FENTANYL CITRATE 0.05 MG/ML IJ SOLN
INTRAMUSCULAR | Status: DC | PRN
  Administered 2013-07-18: 100 ug via INTRAVENOUS

## 2013-07-18 MED ORDER — LEVOTHYROXINE SODIUM 75 MCG PO TABS
75.0000 ug | ORAL_TABLET | Freq: Every day | ORAL | Status: DC
  Administered 2013-07-19: 75 ug via ORAL
  Filled 2013-07-18: qty 1

## 2013-07-18 MED ORDER — MAGNESIUM HYDROXIDE 400 MG/5ML PO SUSP: 30.0000 mL | Freq: Every day | ORAL | Status: DC | PRN

## 2013-07-18 MED ORDER — SODIUM CHLORIDE 0.9 % IV SOLN: INTRAVENOUS | Status: DC

## 2013-07-18 MED ORDER — SODIUM CHLORIDE 0.9 % IJ SOLN
3.0000 mL | Freq: Two times a day (BID) | INTRAMUSCULAR | Status: DC
  Administered 2013-07-18: 3 mL via INTRAVENOUS

## 2013-07-18 MED ORDER — INSULIN DETEMIR 100 UNIT/ML ~~LOC~~ SOLN
16.0000 [IU] | Freq: Every day | SUBCUTANEOUS | Status: DC
  Administered 2013-07-19: 16 [IU] via SUBCUTANEOUS
  Filled 2013-07-18: qty 0.16

## 2013-07-18 MED ORDER — THROMBIN 5000 UNITS EX SOLR
CUTANEOUS | Status: DC | PRN
  Administered 2013-07-18 (×2): 5000 [IU] via TOPICAL

## 2013-07-18 MED ORDER — ONDANSETRON HCL 4 MG/2ML IJ SOLN
INTRAMUSCULAR | Status: DC | PRN
  Administered 2013-07-18: 4 mg via INTRAVENOUS

## 2013-07-18 MED ORDER — PHENOL 1.4 % MT LIQD: 1.0000 | OROMUCOSAL | Status: DC | PRN

## 2013-07-18 MED ORDER — FENTANYL CITRATE 0.05 MG/ML IJ SOLN
INTRAMUSCULAR | Status: AC
  Filled 2013-07-18: qty 2

## 2013-07-18 MED ORDER — ACETAMINOPHEN 325 MG PO TABS: 650.0000 mg | ORAL_TABLET | ORAL | Status: DC | PRN

## 2013-07-18 MED ORDER — PHENYLEPHRINE HCL 10 MG/ML IJ SOLN
INTRAMUSCULAR | Status: DC | PRN
  Administered 2013-07-18: 40 ug via INTRAVENOUS

## 2013-07-18 MED ORDER — ALUM & MAG HYDROXIDE-SIMETH 200-200-20 MG/5ML PO SUSP: 30.0000 mL | Freq: Four times a day (QID) | ORAL | Status: DC | PRN

## 2013-07-18 MED ORDER — CYCLOBENZAPRINE HCL 10 MG PO TABS
10.0000 mg | ORAL_TABLET | Freq: Three times a day (TID) | ORAL | Status: DC | PRN
  Administered 2013-07-18: 10 mg via ORAL
  Filled 2013-07-18: qty 1

## 2013-07-18 MED ORDER — LACTATED RINGERS IV SOLN
INTRAVENOUS | Status: DC | PRN
  Administered 2013-07-18: 10:00:00 via INTRAVENOUS

## 2013-07-18 MED ORDER — OXYCODONE HCL 5 MG PO TABS: 5.0000 mg | ORAL_TABLET | Freq: Once | ORAL | Status: DC | PRN

## 2013-07-18 MED ORDER — FAMOTIDINE 20 MG PO TABS
20.0000 mg | ORAL_TABLET | Freq: Two times a day (BID) | ORAL | Status: DC
  Administered 2013-07-18: 20 mg via ORAL
  Filled 2013-07-18 (×3): qty 1

## 2013-07-18 MED ORDER — SODIUM CHLORIDE 0.9 % IJ SOLN: 3.0000 mL | INTRAMUSCULAR | Status: DC | PRN

## 2013-07-18 MED ORDER — HYDROXYZINE HCL 25 MG PO TABS: 50.0000 mg | ORAL_TABLET | ORAL | Status: DC | PRN

## 2013-07-18 MED ORDER — 0.9 % SODIUM CHLORIDE (POUR BTL) OPTIME
TOPICAL | Status: DC | PRN
  Administered 2013-07-18: 1000 mL

## 2013-07-18 MED ORDER — KETOROLAC TROMETHAMINE 30 MG/ML IJ SOLN
15.0000 mg | Freq: Four times a day (QID) | INTRAMUSCULAR | Status: DC
  Administered 2013-07-18 (×2): 15 mg via INTRAVENOUS
  Administered 2013-07-19: 08:00:00 via INTRAVENOUS
  Administered 2013-07-19: 15 mg via INTRAVENOUS
  Filled 2013-07-18 (×7): qty 1

## 2013-07-18 MED ORDER — KETOROLAC TROMETHAMINE 30 MG/ML IJ SOLN: 15.0000 mg | Freq: Once | INTRAMUSCULAR | Status: DC

## 2013-07-18 MED ORDER — GLYCOPYRROLATE 0.2 MG/ML IJ SOLN
INTRAMUSCULAR | Status: DC | PRN
  Administered 2013-07-18: .6 mg via INTRAVENOUS

## 2013-07-18 MED ORDER — DOCUSATE SODIUM 100 MG PO CAPS
300.0000 mg | ORAL_CAPSULE | Freq: Every day | ORAL | Status: DC
  Administered 2013-07-18: 300 mg via ORAL
  Filled 2013-07-18: qty 3

## 2013-07-18 MED ORDER — MIDAZOLAM HCL 5 MG/5ML IJ SOLN
INTRAMUSCULAR | Status: DC | PRN
  Administered 2013-07-18: 2 mg via INTRAVENOUS

## 2013-07-18 MED ORDER — PROMETHAZINE HCL 25 MG/ML IJ SOLN: 6.2500 mg | INTRAMUSCULAR | Status: DC | PRN

## 2013-07-18 MED ORDER — HYDROMORPHONE HCL PF 1 MG/ML IJ SOLN: 0.2500 mg | INTRAMUSCULAR | Status: DC | PRN

## 2013-07-18 MED ORDER — ARTIFICIAL TEARS OP OINT
TOPICAL_OINTMENT | OPHTHALMIC | Status: DC | PRN
  Administered 2013-07-18: 1 via OPHTHALMIC

## 2013-07-18 MED ORDER — BUPIVACAINE HCL (PF) 0.5 % IJ SOLN
INTRAMUSCULAR | Status: DC | PRN
  Administered 2013-07-18: 10 mL

## 2013-07-18 MED ORDER — PROPOFOL 10 MG/ML IV BOLUS
INTRAVENOUS | Status: DC | PRN
  Administered 2013-07-18: 150 mg via INTRAVENOUS

## 2013-07-18 MED ORDER — HEMOSTATIC AGENTS (NO CHARGE) OPTIME
TOPICAL | Status: DC | PRN
  Administered 2013-07-18: 1 via TOPICAL

## 2013-07-18 MED ORDER — LIDOCAINE HCL (CARDIAC) 20 MG/ML IV SOLN
INTRAVENOUS | Status: DC | PRN
  Administered 2013-07-18: 100 mg via INTRAVENOUS

## 2013-07-18 MED ORDER — POLYETHYLENE GLYCOL 3350 17 G PO PACK
17.0000 g | PACK | Freq: Every day | ORAL | Status: DC
  Administered 2013-07-18 – 2013-07-19 (×2): 17 g via ORAL
  Filled 2013-07-18 (×2): qty 1

## 2013-07-18 MED ORDER — CEFAZOLIN SODIUM-DEXTROSE 2-3 GM-% IV SOLR
INTRAVENOUS | Status: AC
  Filled 2013-07-18: qty 50

## 2013-07-18 MED ORDER — ACETAMINOPHEN 650 MG RE SUPP: 650.0000 mg | RECTAL | Status: DC | PRN

## 2013-07-18 MED ORDER — METHOCARBAMOL 500 MG PO TABS
500.0000 mg | ORAL_TABLET | Freq: Four times a day (QID) | ORAL | Status: DC
  Filled 2013-07-18 (×4): qty 1

## 2013-07-18 MED ORDER — INSULIN ASPART 100 UNIT/ML ~~LOC~~ SOLN
5.0000 [IU] | Freq: Two times a day (BID) | SUBCUTANEOUS | Status: DC
  Administered 2013-07-19: 5 [IU] via SUBCUTANEOUS

## 2013-07-18 MED ORDER — OXYCODONE-ACETAMINOPHEN 5-325 MG PO TABS: 1.0000 | ORAL_TABLET | ORAL | Status: DC | PRN

## 2013-07-18 MED ORDER — SODIUM CHLORIDE 0.9 % IR SOLN
Status: DC | PRN
  Administered 2013-07-18: 12:00:00

## 2013-07-18 MED ORDER — METOPROLOL TARTRATE 50 MG PO TABS
50.0000 mg | ORAL_TABLET | Freq: Once | ORAL | Status: AC
  Administered 2013-07-18: 50 mg via ORAL
  Filled 2013-07-18 (×2): qty 1

## 2013-07-18 MED ORDER — KETOROLAC TROMETHAMINE 30 MG/ML IJ SOLN
INTRAMUSCULAR | Status: AC
  Filled 2013-07-18: qty 1

## 2013-07-18 MED ORDER — MORPHINE SULFATE 4 MG/ML IJ SOLN: 4.0000 mg | INTRAMUSCULAR | Status: DC | PRN

## 2013-07-18 MED ORDER — METOPROLOL TARTRATE 50 MG PO TABS
50.0000 mg | ORAL_TABLET | Freq: Every day | ORAL | Status: DC
  Administered 2013-07-18: 50 mg via ORAL
  Filled 2013-07-18 (×2): qty 1

## 2013-07-18 MED ORDER — OXYCODONE HCL 5 MG/5ML PO SOLN: 5.0000 mg | Freq: Once | ORAL | Status: DC | PRN

## 2013-07-18 SURGICAL SUPPLY — 59 items
BAG DECANTER FOR FLEXI CONT (MISCELLANEOUS) ×2 IMPLANT
BENZOIN TINCTURE PRP APPL 2/3 (GAUZE/BANDAGES/DRESSINGS) IMPLANT
BLADE SURG ROTATE 9660 (MISCELLANEOUS) IMPLANT
BRUSH SCRUB EZ PLAIN DRY (MISCELLANEOUS) ×2 IMPLANT
BUR ACORN 6.0 ACORN (BURR) IMPLANT
BUR ACRON 5.0MM COATED (BURR) ×2 IMPLANT
BUR MATCHSTICK NEURO 3.0 LAGG (BURR) ×2 IMPLANT
CANISTER SUCTION 2500CC (MISCELLANEOUS) ×2 IMPLANT
CLOTH BEACON ORANGE TIMEOUT ST (SAFETY) ×2 IMPLANT
CONT SPEC 4OZ CLIKSEAL STRL BL (MISCELLANEOUS) ×2 IMPLANT
DERMABOND ADHESIVE PROPEN (GAUZE/BANDAGES/DRESSINGS) ×1
DERMABOND ADVANCED (GAUZE/BANDAGES/DRESSINGS)
DERMABOND ADVANCED .7 DNX12 (GAUZE/BANDAGES/DRESSINGS) IMPLANT
DERMABOND ADVANCED .7 DNX6 (GAUZE/BANDAGES/DRESSINGS) ×1 IMPLANT
DRAPE LAPAROTOMY 100X72X124 (DRAPES) ×2 IMPLANT
DRAPE MICROSCOPE LEICA (MISCELLANEOUS) ×2 IMPLANT
DRAPE POUCH INSTRU U-SHP 10X18 (DRAPES) ×2 IMPLANT
DRSG EMULSION OIL 3X3 NADH (GAUZE/BANDAGES/DRESSINGS) IMPLANT
DURAFORM COLLAGEN 1X1 5-PACK (Neuro Prosthesis/Implant) ×2 IMPLANT
ELECT REM PT RETURN 9FT ADLT (ELECTROSURGICAL) ×2
ELECTRODE REM PT RTRN 9FT ADLT (ELECTROSURGICAL) ×1 IMPLANT
GAUZE SPONGE 4X4 16PLY XRAY LF (GAUZE/BANDAGES/DRESSINGS) IMPLANT
GLOVE BIO SURGEON STRL SZ8 (GLOVE) ×2 IMPLANT
GLOVE BIOGEL M 8.0 STRL (GLOVE) ×2 IMPLANT
GLOVE BIOGEL PI IND STRL 8 (GLOVE) ×2 IMPLANT
GLOVE BIOGEL PI INDICATOR 8 (GLOVE) ×2
GLOVE ECLIPSE 7.5 STRL STRAW (GLOVE) ×6 IMPLANT
GLOVE EXAM NITRILE LRG STRL (GLOVE) IMPLANT
GLOVE EXAM NITRILE MD LF STRL (GLOVE) ×2 IMPLANT
GLOVE EXAM NITRILE XL STR (GLOVE) IMPLANT
GLOVE EXAM NITRILE XS STR PU (GLOVE) IMPLANT
GOWN BRE IMP SLV AUR LG STRL (GOWN DISPOSABLE) IMPLANT
GOWN BRE IMP SLV AUR XL STRL (GOWN DISPOSABLE) ×4 IMPLANT
GOWN STRL REIN 2XL LVL4 (GOWN DISPOSABLE) ×4 IMPLANT
KIT BASIN OR (CUSTOM PROCEDURE TRAY) ×2 IMPLANT
KIT ROOM TURNOVER OR (KITS) ×2 IMPLANT
NEEDLE HYPO 18GX1.5 BLUNT FILL (NEEDLE) IMPLANT
NEEDLE SPNL 18GX3.5 QUINCKE PK (NEEDLE) ×2 IMPLANT
NEEDLE SPNL 22GX3.5 QUINCKE BK (NEEDLE) ×2 IMPLANT
NS IRRIG 1000ML POUR BTL (IV SOLUTION) ×2 IMPLANT
PACK LAMINECTOMY NEURO (CUSTOM PROCEDURE TRAY) ×2 IMPLANT
PAD ARMBOARD 7.5X6 YLW CONV (MISCELLANEOUS) ×6 IMPLANT
PATTIES SURGICAL .5 X1 (DISPOSABLE) ×4 IMPLANT
RUBBERBAND STERILE (MISCELLANEOUS) ×4 IMPLANT
SPONGE GAUZE 4X4 12PLY (GAUZE/BANDAGES/DRESSINGS) ×2 IMPLANT
SPONGE LAP 4X18 X RAY DECT (DISPOSABLE) IMPLANT
SPONGE SURGIFOAM ABS GEL SZ50 (HEMOSTASIS) ×2 IMPLANT
STRIP CLOSURE SKIN 1/2X4 (GAUZE/BANDAGES/DRESSINGS) IMPLANT
SUT PROLENE 6 0 BV (SUTURE) IMPLANT
SUT VIC AB 1 CT1 18XBRD ANBCTR (SUTURE) ×1 IMPLANT
SUT VIC AB 1 CT1 8-18 (SUTURE) ×1
SUT VIC AB 2-0 CP2 18 (SUTURE) ×2 IMPLANT
SUT VIC AB 3-0 SH 8-18 (SUTURE) ×2 IMPLANT
SYR 20ML ECCENTRIC (SYRINGE) ×2 IMPLANT
SYR 5ML LL (SYRINGE) IMPLANT
TAPE CLOTH SURG 4X10 WHT LF (GAUZE/BANDAGES/DRESSINGS) ×2 IMPLANT
TOWEL OR 17X24 6PK STRL BLUE (TOWEL DISPOSABLE) ×2 IMPLANT
TOWEL OR 17X26 10 PK STRL BLUE (TOWEL DISPOSABLE) ×2 IMPLANT
WATER STERILE IRR 1000ML POUR (IV SOLUTION) ×2 IMPLANT

## 2013-07-18 NOTE — Progress Notes (Signed)
UR COMPLETED  

## 2013-07-18 NOTE — Anesthesia Postprocedure Evaluation (Signed)
Anesthesia Post Note  Patient: Jesus Young  Procedure(s) Performed: Procedure(s) (LRB): Bilateral Lumbar four-five Lumbar Laminotomy/Microdiskectomy (Bilateral)  Anesthesia type: general  Patient location: PACU  Post pain: Pain level controlled  Post assessment: Patient's Cardiovascular Status Stable  Last Vitals:  Filed Vitals:   07/18/13 1345  BP: 138/67  Pulse: 62  Temp:   Resp: 13    Post vital signs: Reviewed and stable  Level of consciousness: sedated  Complications: No apparent anesthesia complications

## 2013-07-18 NOTE — Plan of Care (Signed)
Problem: Consults Goal: Diagnosis - Spinal Surgery Outcome: Completed/Met Date Met:  07/18/13 Lumbar Laminectomy (Complex)

## 2013-07-18 NOTE — Anesthesia Preprocedure Evaluation (Addendum)
Anesthesia Evaluation   Patient awake    Reviewed: Allergy & Precautions, H&P , NPO status , Patient's Chart, lab work & pertinent test results  History of Anesthesia Complications Negative for: history of anesthetic complications  Airway Mallampati: II TM Distance: >3 FB Neck ROM: Full    Dental  (+) Teeth Intact and Dental Advisory Given   Pulmonary neg pulmonary ROS,    Pulmonary exam normal       Cardiovascular hypertension, Pt. on home beta blockers + CAD, + Past MI and + CABG  Recent stress test: Nl ef, neg for ischemia.   Neuro/Psych negative psych ROS   GI/Hepatic Neg liver ROS, GERD-  Medicated,  Endo/Other  diabetes, Insulin DependentHypothyroidism   Renal/GU Renal InsufficiencyRenal disease     Musculoskeletal   Abdominal   Peds  Hematology   Anesthesia Other Findings   Reproductive/Obstetrics                         Anesthesia Physical Anesthesia Plan  ASA: III  Anesthesia Plan: General   Post-op Pain Management:    Induction: Intravenous  Airway Management Planned: Oral ETT  Additional Equipment:   Intra-op Plan:   Post-operative Plan: Extubation in OR  Informed Consent: I have reviewed the patients History and Physical, chart, labs and discussed the procedure including the risks, benefits and alternatives for the proposed anesthesia with the patient or authorized representative who has indicated his/her understanding and acceptance.   Dental advisory given  Plan Discussed with: CRNA, Anesthesiologist and Surgeon  Anesthesia Plan Comments:        Anesthesia Quick Evaluation

## 2013-07-18 NOTE — Anesthesia Procedure Notes (Signed)
Procedure Name: Intubation Date/Time: 07/18/2013 11:03 AM Performed by: Ned Grace Pre-anesthesia Checklist: Patient identified, Timeout performed, Emergency Drugs available, Suction available and Patient being monitored Patient Re-evaluated:Patient Re-evaluated prior to inductionOxygen Delivery Method: Circle system utilized Preoxygenation: Pre-oxygenation with 100% oxygen Intubation Type: IV induction Ventilation: Mask ventilation without difficulty and Oral airway inserted - appropriate to patient size Laryngoscope Size: Mac and 4 Grade View: Grade I Tube type: Oral Tube size: 7.5 mm Number of attempts: 1 Airway Equipment and Method: Stylet and Oral airway Placement Confirmation: ETT inserted through vocal cords under direct vision,  breath sounds checked- equal and bilateral and positive ETCO2 Secured at: 22 cm Tube secured with: Tape Dental Injury: Teeth and Oropharynx as per pre-operative assessment

## 2013-07-18 NOTE — Transfer of Care (Signed)
Immediate Anesthesia Transfer of Care Note  Patient: Jesus Young  Procedure(s) Performed: Procedure(s) with comments: Bilateral Lumbar four-five Lumbar Laminotomy/Microdiskectomy (Bilateral) - Bilateral Lumbar four-five Lumbar Laminotomy/Microdiskectomy  Patient Location: PACU  Anesthesia Type:General  Level of Consciousness: awake, sedated and patient cooperative  Airway & Oxygen Therapy: Patient Spontanous Breathing and Patient connected to nasal cannula oxygen  Post-op Assessment: Report given to PACU RN, Post -op Vital signs reviewed and stable and Patient moving all extremities X 4  Post vital signs: Reviewed and stable  Complications: No apparent anesthesia complications

## 2013-07-18 NOTE — Op Note (Signed)
07/18/2013  1:01 PM  PATIENT:  Jesus Young  67 y.o. male  PRE-OPERATIVE DIAGNOSIS:  L4-5 Lumbar herniated nucleus pulposus, Lumbar spondylosis, Lumbar degenerative disc disease, Lumbar radiculopathy  POST-OPERATIVE DIAGNOSIS:  L4-5 Lumbar herniated nucleus pulposus, Lumbar spondylosis, Lumbar degenerative disc disease, Lumbar radiculopathy  PROCEDURE:  Procedure(s): Bilateral Lumbar four-five Lumbar Laminotomy/Microdiskectomy:  Bilateral L4-5 lumbar laminotomy and microdiscectomy, with the operating microscope, microdissection, and microsurgical technique  SURGEON:  Surgeon(s): Hosie Spangle, MD Eustace Moore, MD  ASSISTANTS: Eustace Moore, M.D.  ANESTHESIA:   general  EBL:  Total I/O In: 1200 [I.V.:1200] Out: 150 [Blood:150]  BLOOD ADMINISTERED:none  COUNT: Correct per nursing staff  DICTATION: Patient was brought to the operating room and placed under general endotracheal anesthesia. Patient was turned to prone position the lumbar region was prepped with Betadine soap and solution and draped in a sterile fashion. The midline was infiltrated with local anesthetic with epinephrine. A localizing x-ray was taken and the L4-5 level was identified. Midline incision was made over the L4-5 level and was carried down through the subcutaneous tissue to the lumbar fascia. The lumbar fascia was incised bilaterally and the paraspinal muscles were dissected from the spinous processes and lamina in a subperiosteal fashion. Another x-ray was taken and the L4-5 intralaminar space was identified. The operating microscope was draped and brought into the field provided additional magnification, illumination, and visualization. Laminotomy was performed bilaterally using the high-speed drill and Kerrison punches. The ligamentum flavum was carefully resected. The underlying thecal sac and nerve root were identified. The disc herniation was identified bilaterally and the thecal sac and nerve root  gently retracted medially. We begin the discectomy on the right side. There was a large fragment that extended caudally behind the body of L5. This was mobilized and removed. We then incised the anulus and entrance the disc space and proceeded with a thorough discectomy using a variety of pituitary rongeurs and micro-curettes. All loose fragments distal to remove from the disc space and the epidural space, with good decompression of the thecal sac and exiting right L5 nerve root. We paid particular attention to the broad-based disc protrusion, and using micro-curettes were able to mobilize and remove this spondylitic disc herniation. We then split the left side. The disc herniation was identified, it was located rostrally on the body of L4. It was mobilized removed. In the and good decompression of the thecal sac from the left side as well as the exiting left L5 nerve root was also achieved. We then went back to the right side and again examined the disc space to ensure that all loose fragments had been removed. This was confirmed. We then Celsius this with the use of bipolar cautery and Gelfoam with thrombin. The Gelfoam was removed, the wound irrigated excess with bacitracin solution, and hemostasis was confirmed. We then instilled 2 cc of fentanyl into the epidural space, and proceeded with closure. Deep fascia was closed with interrupted undyed 1 Vicryl sutures. Scarpa's fascia was closed with interrupted undyed 1 and 2-0 Vicryl sutures, and the subcutaneous and subcuticular layer were closed with interrupted inverted 2-0 and 3-0 undyed Vicryl sutures. The skin edges were approximated with Dermabond. Following surgery the patient was turned back to a supine position to be reversed from the anesthetic extubated and transferred to the recovery room for further care.   PLAN OF CARE: Admit for overnight observation  PATIENT DISPOSITION:  PACU - hemodynamically stable.   Delay start of Pharmacological VTE  agent (>24hrs) due to surgical blood loss or risk of bleeding:  yes

## 2013-07-18 NOTE — Addendum Note (Signed)
Addendum created 07/18/13 1504 by Ned Grace, RN   Modules edited: Anesthesia Events, Anesthesia Flowsheet

## 2013-07-18 NOTE — H&P (Signed)
Subjective: Patient is a 67 y.o. right handed male who is admitted for treatment of a large L4-5 lumbar disc herniation.  Over 2 months ago, patient developed LBP, initially treated by his primary physician with a prednisone dosepack.  However subsequently he developed severe right lumbar radicular pain.  An MRI was done which revealed a large, broad-based L4-5 HNP that extended caudally on the right and rostrally on the left, with relative canal stenosis.  He is admitted now for bilateral L4-5 lumbar laminotomy and microdiscectomy.   Past Medical History  Diagnosis Date  . Diabetes mellitus without complication     takes Levemir and Humalog daily  . GERD (gastroesophageal reflux disease)     takes Protonix daily and Zantac  . Hypertension     takes Metoprolol daily  . Hypothyroidism     takes Synthroid daily  . Hyperlipidemia     takes Zocor every evening  . Night muscle spasms     takes Robaxin 4 x day   . Constipation     takes Colace at bedtime  . Cancer     prostate  . Myocardial infarction     many yrs before 2002  . Arthritis     back and knees  . Chronic back pain   . Coronary artery disease     takes Plavix daily but is on hold for surgery  . Hemorrhoids   . History of kidney stones   . Cataract     immature on right    Past Surgical History  Procedure Laterality Date  . Tonsillectomy    . Coronary artery bypass graft  2002    x 5  . Prostatectomy  10/2009  . Cardiac catheterization  05/17/02  . Coronary angioplasty      x 1   . Colonoscopy    . Skin spots removed from back -precancerous      No prescriptions prior to admission   No Known Allergies  History  Substance Use Topics  . Smoking status: Never Smoker   . Smokeless tobacco: Not on file  . Alcohol Use: No    No family history on file.   Review of Systems A comprehensive review of systems was negative.  Objective: Vital signs in last 24 hours:    EXAM: Patient is a WD, WN, in NAD.   Lungs  are clear to auscultation , the patient has symmetrical respiratory excursion. Heart has a regular rate and rhythm normal S1 and S2 no murmur.   Abdomen is soft nontender nondistended bowel sounds are present. Extremity examination shows no clubbing cyanosis or edema. Patient has no tenderness over the lumbar spinous processes and no tenderness over the lumbar paraspinal musculature. Range of motion shows the patient is able to flex to 80 degrees and is able to extend to 0-5 degrees. Straight leg raising is negative bilaterally. Motor examination shows 5 over 5 strength in the lower extremities including the iliopsoas quadriceps dorsiflexor extensor hallicus  longus and plantar flexor bilaterally. Sensation is intact to pinprick in the distal lower extremities. Reflexes are symmetrical bilaterally. No pathologic reflexes are present. Patient has a normal gait and stance..   Data Review:CBC    Component Value Date/Time   WBC 5.7 07/12/2013 1450   RBC 5.36 07/12/2013 1450   HGB 17.0 07/12/2013 1450   HCT 50.1 07/12/2013 1450   PLT 176 07/12/2013 1450   MCV 93.5 07/12/2013 1450   MCH 31.7 07/12/2013 1450   MCHC 33.9 07/12/2013 1450  RDW 13.6 07/12/2013 1450   LYMPHSABS 0.4* 11/19/2009 0130   MONOABS 0.4 11/19/2009 0130   EOSABS 0.1 11/19/2009 0130   BASOSABS 0.0 11/19/2009 0130                          BMET    Component Value Date/Time   NA 139 07/12/2013 1450   K 4.9 07/12/2013 1450   CL 105 07/12/2013 1450   CO2 27 07/12/2013 1450   GLUCOSE 187* 07/12/2013 1450   BUN 22 07/12/2013 1450   CREATININE 1.58* 07/12/2013 1450   CALCIUM 9.4 07/12/2013 1450   GFRNONAA 44* 07/12/2013 1450   GFRAA 51* 07/12/2013 1450     Assessment/Plan: Patient admitted for bilateral L4-5 lumbar laminotomy and microdiscectomy for a large L4-5 HNP.  I've discussed with the patient the nature of his condition, the nature the surgical procedure, the typical length of surgery, hospital stay, and overall recuperation. We discussed  limitations postoperatively. I discussed risks of surgery including risks of infection, bleeding, possibly need for transfusion, the risk of nerve root dysfunction with pain, weakness, numbness, or paresthesias, or risk of dural tear and CSF leakage and possible need for further surgery, the risk of recurrent disc herniation and the possible need for further surgery, and the risk of anesthetic complications including myocardial infarction, stroke, pneumonia, and death. Understanding all this the patient does wish to proceed with surgery and is admitted for such.   Hosie Spangle, MD 07/18/2013 5:40 AM

## 2013-07-18 NOTE — Preoperative (Addendum)
Beta Blockers   Reason not to administer Beta Blockers:Not Applicable, took metoprolol this am 

## 2013-07-18 NOTE — Progress Notes (Signed)
Filed Vitals:   07/18/13 1415 07/18/13 1450 07/18/13 1632 07/18/13 1946  BP: 143/61 162/83 156/80 129/78  Pulse: 65 68 65 84  Temp: 98 F (36.7 C) 98.4 F (36.9 C) 98.6 F (37 C) 98.8 F (37.1 C)  TempSrc:    Oral  Resp: 15 18 18 18   SpO2: 100% 96% 95% 94%    Patient resting in bed, but has been up and ambulating in the halls several times. Excellent relief of his right lumbar radicular pain. Minimal incisional discomfort, minimal residual radicular discomfort. Wound clean and dry. Moving all 4 extremities well. Voiding.  Plan: Doing well following surgery, continued to progress through postoperative recovery.  Hosie Spangle, MD 07/18/2013, 8:18 PM

## 2013-07-19 LAB — GLUCOSE, CAPILLARY: Glucose-Capillary: 117 mg/dL — ABNORMAL HIGH (ref 70–99)

## 2013-07-19 MED ORDER — HYDROCODONE-ACETAMINOPHEN 5-325 MG PO TABS: 1.0000 | ORAL_TABLET | ORAL | Status: DC | PRN

## 2013-07-19 NOTE — Discharge Summary (Signed)
Physician Discharge Summary  Patient ID: KEYSON HARTZEL MRN: [id number] DOB/AGE: 04-15-1946 67 y.o.  Admit date: 07/18/2013 Discharge date: 07/19/2013  Admission Diagnoses:  L4-5 Lumbar herniated nucleus pulposus, Lumbar spondylosis, Lumbar degenerative disc disease, Lumbar radiculopathy  Discharge Diagnoses:  L4-5 Lumbar herniated nucleus pulposus, Lumbar spondylosis, Lumbar degenerative disc disease, Lumbar radiculopathy   Discharged Condition: good  Hospital Course: Patient was admitted underwent a bilateral L4-5 lumbar laminotomy and microdiscectomy. He has done well following surgery. He is up and then actively in the halls. His wound is healing well, it is clean, dry, there is no erythema, ecchymosis, swelling, or drainage. He is asking to be discharged to home. He has been given instructions regarding wound care and activities. He is to return for followup with me in 3 weeks.  Discharge Exam: Blood pressure 103/74, pulse 77, temperature 97.6 F (36.4 C), temperature source Oral, resp. rate 16, SpO2 95.00%.  Disposition: Home     Medication List         aspirin EC 81 MG tablet  Take 81 mg by mouth daily.     celecoxib 200 MG capsule  Commonly known as:  CELEBREX  Take 200 mg by mouth daily.     clopidogrel 75 MG tablet  Commonly known as:  PLAVIX  Take 75 mg by mouth daily.     docusate sodium 100 MG capsule  Commonly known as:  COLACE  Take 300 mg by mouth at bedtime.     HYDROcodone-acetaminophen 5-325 MG per tablet  Commonly known as:  NORCO/VICODIN  Take 1-2 tablets by mouth every 4 (four) hours as needed.     insulin detemir 100 UNIT/ML injection  Commonly known as:  LEVEMIR  Inject 10-16 Units into the skin at bedtime. 16 units in the am, and 10 units in the pm.     insulin lispro 100 UNIT/ML injection  Commonly known as:  HUMALOG  Inject 5-8 Units into the skin 3 (three) times daily before meals. 5 units at lunch, and 8 units after dinner.     levothyroxine 75 MCG tablet  Commonly known as:  SYNTHROID, LEVOTHROID  Take 75 mcg by mouth at bedtime.     methocarbamol 500 MG tablet  Commonly known as:  ROBAXIN  Take 500 mg by mouth 4 (four) times daily.     metoprolol 50 MG tablet  Commonly known as:  LOPRESSOR  Take 50 mg by mouth at bedtime.     oxyCODONE-acetaminophen 5-325 MG per tablet  Commonly known as:  PERCOCET/ROXICET  Take 1 tablet by mouth every 4 (four) hours as needed for pain.     pantoprazole 40 MG tablet  Commonly known as:  PROTONIX  Take 40 mg by mouth daily.     ranitidine 300 MG tablet  Commonly known as:  ZANTAC  Take 300 mg by mouth at bedtime.     simvastatin 40 MG tablet  Commonly known as:  ZOCOR  Take 40 mg by mouth every evening.         Signed: Hosie Spangle, MD 07/19/2013, 9:49 AM

## 2013-07-19 NOTE — Progress Notes (Signed)
Pt doing well. Pt and wife given D/C instructions with Rx, verbal understanding was given. All questions were answered prior to D/C.Pt D/C'd home with corset brace per MD order. Pt D/C'd home with wife @ 1050 per MD order. Holli Humbles, RN

## 2013-07-20 ENCOUNTER — Encounter (HOSPITAL_COMMUNITY): Payer: Self-pay | Admitting: Neurosurgery

## 2013-12-21 ENCOUNTER — Ambulatory Visit: Payer: Self-pay

## 2014-05-07 ENCOUNTER — Other Ambulatory Visit: Payer: Self-pay | Admitting: Physician Assistant

## 2014-09-05 ENCOUNTER — Other Ambulatory Visit: Payer: Self-pay | Admitting: Physician Assistant

## 2014-12-25 DIAGNOSIS — E1165 Type 2 diabetes mellitus with hyperglycemia: Secondary | ICD-10-CM | POA: Diagnosis not present

## 2014-12-25 DIAGNOSIS — I10 Essential (primary) hypertension: Secondary | ICD-10-CM | POA: Diagnosis not present

## 2015-01-01 DIAGNOSIS — N189 Chronic kidney disease, unspecified: Secondary | ICD-10-CM | POA: Diagnosis not present

## 2015-01-01 DIAGNOSIS — M79672 Pain in left foot: Secondary | ICD-10-CM | POA: Diagnosis not present

## 2015-01-01 DIAGNOSIS — I10 Essential (primary) hypertension: Secondary | ICD-10-CM | POA: Diagnosis not present

## 2015-01-01 DIAGNOSIS — E780 Pure hypercholesterolemia: Secondary | ICD-10-CM | POA: Diagnosis not present

## 2015-02-04 DIAGNOSIS — E119 Type 2 diabetes mellitus without complications: Secondary | ICD-10-CM | POA: Diagnosis not present

## 2015-02-04 DIAGNOSIS — H2513 Age-related nuclear cataract, bilateral: Secondary | ICD-10-CM | POA: Diagnosis not present

## 2015-02-20 DIAGNOSIS — E039 Hypothyroidism, unspecified: Secondary | ICD-10-CM | POA: Diagnosis not present

## 2015-02-20 DIAGNOSIS — E1165 Type 2 diabetes mellitus with hyperglycemia: Secondary | ICD-10-CM | POA: Diagnosis not present

## 2015-02-20 DIAGNOSIS — E780 Pure hypercholesterolemia: Secondary | ICD-10-CM | POA: Diagnosis not present

## 2015-02-24 DIAGNOSIS — E780 Pure hypercholesterolemia: Secondary | ICD-10-CM | POA: Diagnosis not present

## 2015-02-24 DIAGNOSIS — E039 Hypothyroidism, unspecified: Secondary | ICD-10-CM | POA: Diagnosis not present

## 2015-02-24 DIAGNOSIS — E1165 Type 2 diabetes mellitus with hyperglycemia: Secondary | ICD-10-CM | POA: Diagnosis not present

## 2015-02-24 DIAGNOSIS — I10 Essential (primary) hypertension: Secondary | ICD-10-CM | POA: Diagnosis not present

## 2015-02-28 DIAGNOSIS — G933 Postviral fatigue syndrome: Secondary | ICD-10-CM | POA: Diagnosis not present

## 2015-02-28 DIAGNOSIS — J22 Unspecified acute lower respiratory infection: Secondary | ICD-10-CM | POA: Diagnosis not present

## 2015-02-28 DIAGNOSIS — R05 Cough: Secondary | ICD-10-CM | POA: Diagnosis not present

## 2015-02-28 DIAGNOSIS — J060 Acute laryngopharyngitis: Secondary | ICD-10-CM | POA: Diagnosis not present

## 2015-03-11 DIAGNOSIS — M542 Cervicalgia: Secondary | ICD-10-CM | POA: Diagnosis not present

## 2015-03-21 DIAGNOSIS — M47812 Spondylosis without myelopathy or radiculopathy, cervical region: Secondary | ICD-10-CM | POA: Diagnosis not present

## 2015-03-21 DIAGNOSIS — M542 Cervicalgia: Secondary | ICD-10-CM | POA: Diagnosis not present

## 2015-03-21 DIAGNOSIS — M5136 Other intervertebral disc degeneration, lumbar region: Secondary | ICD-10-CM | POA: Diagnosis not present

## 2015-04-02 DIAGNOSIS — C61 Malignant neoplasm of prostate: Secondary | ICD-10-CM | POA: Diagnosis not present

## 2015-04-07 DIAGNOSIS — M546 Pain in thoracic spine: Secondary | ICD-10-CM | POA: Diagnosis not present

## 2015-04-07 DIAGNOSIS — E039 Hypothyroidism, unspecified: Secondary | ICD-10-CM | POA: Diagnosis not present

## 2015-04-09 DIAGNOSIS — R312 Other microscopic hematuria: Secondary | ICD-10-CM | POA: Diagnosis not present

## 2015-04-09 DIAGNOSIS — C61 Malignant neoplasm of prostate: Secondary | ICD-10-CM | POA: Diagnosis not present

## 2015-04-09 DIAGNOSIS — N5201 Erectile dysfunction due to arterial insufficiency: Secondary | ICD-10-CM | POA: Diagnosis not present

## 2015-04-10 DIAGNOSIS — M546 Pain in thoracic spine: Secondary | ICD-10-CM | POA: Diagnosis not present

## 2015-04-15 DIAGNOSIS — M546 Pain in thoracic spine: Secondary | ICD-10-CM | POA: Diagnosis not present

## 2015-04-16 DIAGNOSIS — N209 Urinary calculus, unspecified: Secondary | ICD-10-CM | POA: Diagnosis not present

## 2015-04-16 DIAGNOSIS — R312 Other microscopic hematuria: Secondary | ICD-10-CM | POA: Diagnosis not present

## 2015-04-16 DIAGNOSIS — K5730 Diverticulosis of large intestine without perforation or abscess without bleeding: Secondary | ICD-10-CM | POA: Diagnosis not present

## 2015-04-16 DIAGNOSIS — N200 Calculus of kidney: Secondary | ICD-10-CM | POA: Diagnosis not present

## 2015-04-17 DIAGNOSIS — M546 Pain in thoracic spine: Secondary | ICD-10-CM | POA: Diagnosis not present

## 2015-04-21 DIAGNOSIS — N209 Urinary calculus, unspecified: Secondary | ICD-10-CM | POA: Diagnosis not present

## 2015-04-22 DIAGNOSIS — M546 Pain in thoracic spine: Secondary | ICD-10-CM | POA: Diagnosis not present

## 2015-04-23 DIAGNOSIS — H02831 Dermatochalasis of right upper eyelid: Secondary | ICD-10-CM | POA: Diagnosis not present

## 2015-04-24 DIAGNOSIS — M546 Pain in thoracic spine: Secondary | ICD-10-CM | POA: Diagnosis not present

## 2015-04-29 DIAGNOSIS — M546 Pain in thoracic spine: Secondary | ICD-10-CM | POA: Diagnosis not present

## 2015-05-01 DIAGNOSIS — M546 Pain in thoracic spine: Secondary | ICD-10-CM | POA: Diagnosis not present

## 2015-05-09 DIAGNOSIS — Z0000 Encounter for general adult medical examination without abnormal findings: Secondary | ICD-10-CM | POA: Diagnosis not present

## 2015-05-09 DIAGNOSIS — N209 Urinary calculus, unspecified: Secondary | ICD-10-CM | POA: Diagnosis not present

## 2015-05-09 DIAGNOSIS — E1165 Type 2 diabetes mellitus with hyperglycemia: Secondary | ICD-10-CM | POA: Diagnosis not present

## 2015-05-09 DIAGNOSIS — N390 Urinary tract infection, site not specified: Secondary | ICD-10-CM | POA: Diagnosis not present

## 2015-05-09 DIAGNOSIS — N189 Chronic kidney disease, unspecified: Secondary | ICD-10-CM | POA: Diagnosis not present

## 2015-05-09 DIAGNOSIS — E780 Pure hypercholesterolemia: Secondary | ICD-10-CM | POA: Diagnosis not present

## 2015-05-09 DIAGNOSIS — Z1389 Encounter for screening for other disorder: Secondary | ICD-10-CM | POA: Diagnosis not present

## 2015-05-09 DIAGNOSIS — I10 Essential (primary) hypertension: Secondary | ICD-10-CM | POA: Diagnosis not present

## 2015-05-10 ENCOUNTER — Emergency Department (HOSPITAL_COMMUNITY)
Admission: EM | Admit: 2015-05-10 | Discharge: 2015-05-10 | Disposition: A | Discharge status: Home / Self Care | Payer: Medicare Other | Attending: Emergency Medicine | Admitting: Emergency Medicine

## 2015-05-10 ENCOUNTER — Encounter (HOSPITAL_COMMUNITY): Payer: Self-pay | Admitting: Emergency Medicine

## 2015-05-10 DIAGNOSIS — K219 Gastro-esophageal reflux disease without esophagitis: Secondary | ICD-10-CM | POA: Diagnosis not present

## 2015-05-10 DIAGNOSIS — I2510 Atherosclerotic heart disease of native coronary artery without angina pectoris: Secondary | ICD-10-CM | POA: Diagnosis not present

## 2015-05-10 DIAGNOSIS — Z8546 Personal history of malignant neoplasm of prostate: Secondary | ICD-10-CM | POA: Diagnosis not present

## 2015-05-10 DIAGNOSIS — E039 Hypothyroidism, unspecified: Secondary | ICD-10-CM | POA: Diagnosis not present

## 2015-05-10 DIAGNOSIS — Z79899 Other long term (current) drug therapy: Secondary | ICD-10-CM | POA: Diagnosis not present

## 2015-05-10 DIAGNOSIS — Z7902 Long term (current) use of antithrombotics/antiplatelets: Secondary | ICD-10-CM | POA: Diagnosis not present

## 2015-05-10 DIAGNOSIS — Z9889 Other specified postprocedural states: Secondary | ICD-10-CM | POA: Insufficient documentation

## 2015-05-10 DIAGNOSIS — Z9079 Acquired absence of other genital organ(s): Secondary | ICD-10-CM | POA: Diagnosis not present

## 2015-05-10 DIAGNOSIS — R7989 Other specified abnormal findings of blood chemistry: Secondary | ICD-10-CM | POA: Diagnosis present

## 2015-05-10 DIAGNOSIS — I252 Old myocardial infarction: Secondary | ICD-10-CM | POA: Diagnosis not present

## 2015-05-10 DIAGNOSIS — Z87442 Personal history of urinary calculi: Secondary | ICD-10-CM | POA: Insufficient documentation

## 2015-05-10 DIAGNOSIS — Z7982 Long term (current) use of aspirin: Secondary | ICD-10-CM | POA: Insufficient documentation

## 2015-05-10 DIAGNOSIS — M1990 Unspecified osteoarthritis, unspecified site: Secondary | ICD-10-CM | POA: Diagnosis not present

## 2015-05-10 DIAGNOSIS — E785 Hyperlipidemia, unspecified: Secondary | ICD-10-CM | POA: Insufficient documentation

## 2015-05-10 DIAGNOSIS — E875 Hyperkalemia: Secondary | ICD-10-CM | POA: Diagnosis not present

## 2015-05-10 DIAGNOSIS — E119 Type 2 diabetes mellitus without complications: Secondary | ICD-10-CM | POA: Insufficient documentation

## 2015-05-10 DIAGNOSIS — G8929 Other chronic pain: Secondary | ICD-10-CM | POA: Diagnosis not present

## 2015-05-10 DIAGNOSIS — Z9861 Coronary angioplasty status: Secondary | ICD-10-CM | POA: Insufficient documentation

## 2015-05-10 DIAGNOSIS — R42 Dizziness and giddiness: Secondary | ICD-10-CM | POA: Diagnosis not present

## 2015-05-10 LAB — I-STAT CHEM 8, ED
BUN: 28 mg/dL — AB (ref 6–20)
CHLORIDE: 106 mmol/L (ref 101–111)
CREATININE: 2 mg/dL — AB (ref 0.61–1.24)
Calcium, Ion: 1.18 mmol/L (ref 1.13–1.30)
Glucose, Bld: 62 mg/dL — ABNORMAL LOW (ref 65–99)
HCT: 39 % (ref 39.0–52.0)
HEMOGLOBIN: 13.3 g/dL (ref 13.0–17.0)
Potassium: 4.6 mmol/L (ref 3.5–5.1)
SODIUM: 143 mmol/L (ref 135–145)
TCO2: 22 mmol/L (ref 0–100)

## 2015-05-10 LAB — BASIC METABOLIC PANEL
ANION GAP: 5 (ref 5–15)
BUN: 30 mg/dL — AB (ref 6–20)
CO2: 26 mmol/L (ref 22–32)
Calcium: 9.3 mg/dL (ref 8.9–10.3)
Chloride: 107 mmol/L (ref 101–111)
Creatinine, Ser: 2.15 mg/dL — ABNORMAL HIGH (ref 0.61–1.24)
GFR calc Af Amer: 35 mL/min — ABNORMAL LOW (ref >60)
GFR calc non Af Amer: 30 mL/min — ABNORMAL LOW (ref >60)
Glucose, Bld: 116 mg/dL — ABNORMAL HIGH (ref 65–99)
POTASSIUM: 7.1 mmol/L — AB (ref 3.5–5.1)
SODIUM: 138 mmol/L (ref 135–145)

## 2015-05-10 LAB — CBG MONITORING, ED: Glucose-Capillary: 88 mg/dL (ref 65–99)

## 2015-05-10 MED ORDER — SODIUM CHLORIDE 0.9 % IV BOLUS (SEPSIS)
1000.0000 mL | Freq: Once | INTRAVENOUS | Status: DC
Start: 2015-05-10 — End: 2015-05-10

## 2015-05-10 MED ORDER — SODIUM CHLORIDE 0.9 % IV SOLN
1.0000 g | Freq: Once | INTRAVENOUS | Status: AC
  Administered 2015-05-10: 1 g via INTRAVENOUS
  Filled 2015-05-10: qty 10

## 2015-05-10 MED ORDER — DEXTROSE 50 % IV SOLN
1.0000 | Freq: Once | INTRAVENOUS | Status: AC
  Administered 2015-05-10: 50 mL via INTRAVENOUS
  Filled 2015-05-10: qty 50

## 2015-05-10 MED ORDER — SODIUM CHLORIDE 0.9 % IV SOLN
1.0000 g | Freq: Once | INTRAVENOUS | Status: DC
  Filled 2015-05-10: qty 10

## 2015-05-10 MED ORDER — SODIUM CHLORIDE 0.9 % IV BOLUS (SEPSIS)
1000.0000 mL | Freq: Once | INTRAVENOUS | Status: AC
  Administered 2015-05-10: 1000 mL via INTRAVENOUS

## 2015-05-10 MED ORDER — INSULIN ASPART 100 UNIT/ML IV SOLN
10.0000 [IU] | Freq: Once | INTRAVENOUS | Status: AC
  Administered 2015-05-10: 10 [IU] via INTRAVENOUS
  Filled 2015-05-10: qty 0.1

## 2015-05-10 MED ORDER — SODIUM BICARBONATE 8.4 % IV SOLN
50.0000 meq | Freq: Once | INTRAVENOUS | Status: AC
  Administered 2015-05-10: 50 meq via INTRAVENOUS
  Filled 2015-05-10: qty 50

## 2015-05-10 NOTE — ED Notes (Signed)
Patient forgot to mention to MD that he has been taking Potassium Citrate 10 meq (6 per day) for the last 3 weeks or so to help dissolve a kidney stone

## 2015-05-10 NOTE — Discharge Instructions (Signed)
As instructed, and is very important that you follow-up with your primary care physician in 2 days, and discuss repeat blood test to insure that your elevated potassium level has remained corrected.  Please refrain from taking any additional potassium supplements.  Return here for concerning changes in your condition.  Hyperkalemia Hyperkalemia is when you have too much potassium in your blood. This can be a life-threatening condition. Potassium is normally removed (excreted) from the body by the kidneys. CAUSES  The potassium level in your body can become too high for the following reasons:  You take in too much potassium. You can do this by:  Using salt substitutes. They contain large amounts of potassium.  Taking potassium supplements from your caregiver. The dose may be too high for you.  Eating foods or taking nutritional products with potassium.  You excrete too little potassium. This can happen if:  Your kidneys are not functioning properly. Kidney (renal) disease is a very common cause of hyperkalemia.  You are taking medicines that lower your excretion of potassium, such as certain diuretic medicines.  You have an adrenal gland disease called Addison's disease.  You have a urinary tract obstruction, such as kidney stones.  You are on treatment to mechanically clean your blood (dialysis) and you skip a treatment.  You release a high amount of potassium from your cells into your blood. You may have a condition that causes potassium to move from your cells to your bloodstream. This can happen with:  Injury to muscles or other tissues. Most potassium is stored in the muscles.  Severe burns or infections.  Acidic blood plasma (acidosis). Acidosis can result from many diseases, such as uncontrolled diabetes. SYMPTOMS  Usually, there are no symptoms unless the potassium is dangerously high or has risen very quickly. Symptoms may include:  Irregular or very slow  heartbeat.  Feeling sick to your stomach (nauseous).  Tiredness (fatigue).  Nerve problems such as tingling of the skin, numbness of the hands or feet, weakness, or paralysis. DIAGNOSIS  A simple blood test can measure the amount of potassium in your body. An electrocardiogram test of the heart can also help make the diagnosis. The heart may beat dangerously fast or slow down and stop beating with severe hyperkalemia.  TREATMENT  Treatment depends on how bad the condition is and on the underlying cause.  If the hyperkalemia is an emergency (causing heart problems or paralysis), many different medicines can be used alone or together to lower the potassium level briefly. This may include an insulin injection even if you are not diabetic. Emergency dialysis may be needed to remove potassium from the body.  If the hyperkalemia is less severe or dangerous, the underlying cause is treated. This can include taking medicines if needed. Your prescription medicines may be changed. You may also need to take a medicine to help your body get rid of potassium. You may need to eat a diet low in potassium. HOME CARE INSTRUCTIONS   Take medicines and supplements as directed by your caregiver.  Do not take any over-the-counter medicines, supplements, natural products, herbs, or vitamins without reviewing them with your caregiver. Certain supplements and natural food products can have high amounts of potassium. Other products (such as ibuprofen) can damage weak kidneys and raise your potassium.  You may be asked to do repeat lab tests. Be sure to follow these directions.  If you have kidney disease, you may need to follow a low potassium diet. SEEK MEDICAL CARE IF:  You notice an irregular or very slow heartbeat.  You feel lightheaded.  You develop weakness that is unusual for you. SEEK IMMEDIATE MEDICAL CARE IF:   You have shortness of breath.  You have chest discomfort.  You pass out  (faint). MAKE SURE YOU:   Understand these instructions.  Will watch your condition.  Will get help right away if you are not doing well or get worse. Document Released: 10/22/2002 Document Revised: 01/24/2012 Document Reviewed: 02/06/2014 [facility] Patient Information 2015 [place], [place]. This information is not intended to replace advice given to you by your health care provider. Make sure you discuss any questions you have with your health care provider.

## 2015-05-10 NOTE — ED Notes (Signed)
Unable to obtain iv access.  Manuela Schwartz, RN will attempt.

## 2015-05-10 NOTE — ED Provider Notes (Signed)
CSN: [id number]     Arrival date & time 05/10/15  1142 History   First MD Initiated Contact with Patient 05/10/15 1207     Chief Complaint  Patient presents with  . Abnormal Lab    potassium     (Consider location/radiation/quality/duration/timing/severity/associated sxs/prior Treatment) HPI Patient presents with no physical complaints, but with concern for possible hyperkalemia. Patient had previously scheduled physical exam yesterday, and as a part of his routine blood draw was found to be hyperkalemic.  Patient denies any complains, including chest pain, dyspnea, lightheadedness, syncope.   After initial labs returned, I revisited the patient's history, he states that he has had intermittent lightheadedness, though none currently. He denies any swelling, nausea, anorexia and urinary changes, any other focal complaints.   Past Medical History  Diagnosis Date  . Diabetes mellitus without complication     takes Levemir and Humalog daily  . GERD (gastroesophageal reflux disease)     takes Protonix daily and Zantac  . Hypertension     takes Metoprolol daily  . Hypothyroidism     takes Synthroid daily  . Hyperlipidemia     takes Zocor every evening  . Night muscle spasms     takes Robaxin 4 x day   . Constipation     takes Colace at bedtime  . Cancer     prostate  . Myocardial infarction     many yrs before 2002  . Arthritis     back and knees  . Chronic back pain   . Coronary artery disease     takes Plavix daily but is on hold for surgery  . Hemorrhoids   . History of kidney stones   . Cataract     immature on right   Past Surgical History  Procedure Laterality Date  . Tonsillectomy    . Coronary artery bypass graft  2002    x 5  . Prostatectomy  10/2009  . Cardiac catheterization  05/17/02  . Coronary angioplasty      x 1   . Colonoscopy    . Skin spots removed from back -precancerous    . Lumbar laminectomy/decompression microdiscectomy Bilateral 07/18/2013     Procedure: Bilateral Lumbar four-five Lumbar Laminotomy/Microdiskectomy;  Surgeon: Hosie Spangle, MD;  Location: [facility];  Service: Neurosurgery;  Laterality: Bilateral;  Bilateral Lumbar four-five Lumbar Laminotomy/Microdiskectomy   History reviewed. No pertinent family history. History  Substance Use Topics  . Smoking status: Never Smoker   . Smokeless tobacco: Not on file  . Alcohol Use: No    Review of Systems  Constitutional:       Per HPI, otherwise negative  HENT:       Per HPI, otherwise negative  Respiratory:       Per HPI, otherwise negative  Cardiovascular:       Per HPI, otherwise negative  Gastrointestinal: Negative for vomiting.  Endocrine:       Negative aside from HPI  Genitourinary:       Neg aside from HPI   Musculoskeletal:       Per HPI, otherwise negative  Skin: Negative.   Neurological: Positive for light-headedness. Negative for syncope and weakness.   Review of systems completed in 2 sections, after initial abnormal findings discovered, patient had repeat evaluation, and remainder of ROS completed.    Allergies  Review of patient's allergies indicates no known allergies.  Home Medications   Prior to Admission medications   Medication Sig Start Date End Date  Taking? Authorizing Provider  aspirin EC 81 MG tablet Take 81 mg by mouth daily.    Historical Provider, MD  celecoxib (CELEBREX) 200 MG capsule Take 200 mg by mouth daily.    Historical Provider, MD  clopidogrel (PLAVIX) 75 MG tablet Take 75 mg by mouth daily.    Historical Provider, MD  docusate sodium (COLACE) 100 MG capsule Take 300 mg by mouth at bedtime.    Historical Provider, MD  HYDROcodone-acetaminophen (NORCO/VICODIN) 5-325 MG per tablet Take 1-2 tablets by mouth every 4 (four) hours as needed. 07/19/13   Jovita Gamma, MD  insulin detemir (LEVEMIR) 100 UNIT/ML injection Inject 10-16 Units into the skin at bedtime. 16 units in the am, and 10 units in the pm.    Historical  Provider, MD  insulin lispro (HUMALOG) 100 UNIT/ML injection Inject 5-8 Units into the skin 3 (three) times daily before meals. 5 units at lunch, and 8 units after dinner.    Historical Provider, MD  levothyroxine (SYNTHROID, LEVOTHROID) 75 MCG tablet Take 75 mcg by mouth at bedtime.    Historical Provider, MD  methocarbamol (ROBAXIN) 500 MG tablet Take 500 mg by mouth 4 (four) times daily.    Historical Provider, MD  metoprolol (LOPRESSOR) 50 MG tablet Take 50 mg by mouth at bedtime.    Historical Provider, MD  oxyCODONE-acetaminophen (PERCOCET/ROXICET) 5-325 MG per tablet Take 1 tablet by mouth every 4 (four) hours as needed for pain.    Historical Provider, MD  pantoprazole (PROTONIX) 40 MG tablet Take 40 mg by mouth daily.    Historical Provider, MD  ranitidine (ZANTAC) 300 MG tablet Take 300 mg by mouth at bedtime.    Historical Provider, MD  simvastatin (ZOCOR) 40 MG tablet Take 40 mg by mouth every evening.    Historical Provider, MD   There were no vitals taken for this visit. Physical Exam  Constitutional: He is oriented to person, place, and time. He appears well-developed. No distress.  HENT:  Head: Normocephalic and atraumatic.  Eyes: Conjunctivae and EOM are normal.  Cardiovascular: Normal rate and regular rhythm.   Pulmonary/Chest: Effort normal. No stridor. No respiratory distress.  Abdominal: He exhibits no distension.  Neurological: He is alert and oriented to person, place, and time. No cranial nerve deficit.  Psychiatric: He has a normal mood and affect.  Nursing note and vitals reviewed.   ED Course  Procedures (including critical care time) Labs Review Labs Reviewed  BASIC METABOLIC PANEL - Abnormal; Notable for the following:    Potassium 7.1 (*)    Glucose, Bld 116 (*)    BUN 30 (*)    Creatinine, Ser 2.15 (*)    GFR calc non Af Amer 30 (*)    GFR calc Af Amer 35 (*)    All other components within normal limits  CBG MONITORING, ED  I-STAT CHEM 8, ED    I  reviewed the results (including imaging as performed), agree with the interpretation  On repeat exam the patient appears better.  We reviewed all findings.   3:12 PM Patient acknowledged to nursing staff that over the past 3 weeks he has been using potassium supplements, 10 mEq, 6 times daily for kidney stones.  Repeat metabolic panel pending.       MDM   Patient presents with possible abnormality of electrolytes.  On questioning the patient does acknowledge lightheadedness, otherwise denies substantial symptoms. Patient also eventually describes using potassium supplements, and this may be an episode of iatrogenic hyperkalemia. Patient  received fluid resuscitation, with calcium, insulin, dextrose, bicarbonate, had continuous cardiac monitoring. For several hours of monitoring, the patient had no arrhythmia. Patient had no decompensation. Patient's repeat labs demonstrated substantial decline in potassium level, and given his aforementioned minimal complaints, he was counseled on the need to avoid additional potassium supplementation, discharged in stable condition to have repeat blood draw again in 3 days with his primary care physician.  CRITICAL CARE Performed by: Carmin Muskrat Total critical care time: 40 Critical care time was exclusive of separately billable procedures and treating other patients. Critical care was necessary to treat or prevent imminent or life-threatening deterioration. Critical care was time spent personally by me on the following activities: development of treatment plan with patient and/or surrogate as well as nursing, discussions with consultants, evaluation of patient's response to treatment, examination of patient, obtaining history from patient or surrogate, ordering and performing treatments and interventions, ordering and review of laboratory studies, ordering and review of radiographic studies, pulse oximetry and re-evaluation of patient's  condition.     Carmin Muskrat, MD 05/10/15 [id number]

## 2015-05-10 NOTE — ED Notes (Signed)
Went into room to perform EKG, patient questioned why?, states "I'm just here for a blood draw." I informed Dr Sabino Niemann, in room speaking to patient. EKG canceled for the moment

## 2015-05-10 NOTE — ED Notes (Signed)
Pt had routine labwork done yesterday. Was called by PCP to come here because K was 8.8. Wants redraw. Denies any acute complaints

## 2015-05-12 DIAGNOSIS — N189 Chronic kidney disease, unspecified: Secondary | ICD-10-CM | POA: Diagnosis not present

## 2015-05-12 DIAGNOSIS — E875 Hyperkalemia: Secondary | ICD-10-CM | POA: Diagnosis not present

## 2015-05-16 DIAGNOSIS — I2510 Atherosclerotic heart disease of native coronary artery without angina pectoris: Secondary | ICD-10-CM | POA: Diagnosis not present

## 2015-05-16 DIAGNOSIS — I10 Essential (primary) hypertension: Secondary | ICD-10-CM | POA: Diagnosis not present

## 2015-05-16 DIAGNOSIS — Z0000 Encounter for general adult medical examination without abnormal findings: Secondary | ICD-10-CM | POA: Diagnosis not present

## 2015-05-16 DIAGNOSIS — I447 Left bundle-branch block, unspecified: Secondary | ICD-10-CM | POA: Diagnosis not present

## 2015-05-16 DIAGNOSIS — E1165 Type 2 diabetes mellitus with hyperglycemia: Secondary | ICD-10-CM | POA: Diagnosis not present

## 2015-05-27 DIAGNOSIS — N209 Urinary calculus, unspecified: Secondary | ICD-10-CM | POA: Diagnosis not present

## 2015-05-29 ENCOUNTER — Other Ambulatory Visit: Payer: Self-pay | Admitting: Urology

## 2015-05-29 ENCOUNTER — Other Ambulatory Visit (HOSPITAL_COMMUNITY): Payer: Self-pay | Admitting: Urology

## 2015-05-29 DIAGNOSIS — N200 Calculus of kidney: Secondary | ICD-10-CM

## 2015-05-29 DIAGNOSIS — H02834 Dermatochalasis of left upper eyelid: Secondary | ICD-10-CM | POA: Diagnosis not present

## 2015-05-29 DIAGNOSIS — H02831 Dermatochalasis of right upper eyelid: Secondary | ICD-10-CM | POA: Diagnosis not present

## 2015-05-29 DIAGNOSIS — H53453 Other localized visual field defect, bilateral: Secondary | ICD-10-CM | POA: Diagnosis not present

## 2015-06-11 NOTE — H&P (Signed)
History of Present Illness Jesus Young is a 69 year old with the following urologic history:    1) Prostate cancer: He is s/p a BNS RAL radical prostatectomy on 11/13/09. His PSA has been undetectable since surgery.    TNM stage: pT2c N0 Mx  Gleason score: 3+4=7  Surgical margins: Negative  Pretreatment PSA: 6.41  Pretreatment SHIM: 23    2) Nephrolithiasis: He has a history of uric acid nephrolithiasis. He has been treated with potassium citrate in the past although discontinued this medication as he did not wish to be on more medication than he absolutely needed to be. He did appear to have small left renal calculi on ultrasound in June 2011. 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 He was unable to tolerate medical therapy (developed hyperkalemia with potassium citrate) and had hypertension (could not receive sodium bicarbonate or sodium citrate).    Interval history:    He follows up today for further evaluation of his left renal uric acid stone. Since his last visit, he was noted to have developed severe hyperkalemia and was seen in the emergency department for treatment. It was noted that his renal function had worsened from his baseline creatinine 1.5 up to 2.1. It appeared that his potassium citrate in combination with worsening renal function led to his hyperkalemia. Follow-up labs suggested improved hyperkalemia with Dr. Ashby Young. I have since spoken with Dr. Ashby Young and it is unclear whether the change in his renal function is chronic and gradual or represents an acute change. It is also unclear whether this may be related to his partial staghorn left renal calculus or whether it is more related to his chronic medical conditions including hypertension and diabetes. He called last week and stated that he was having some element of pain. He describes this as having been located in his left lower quadrant extending to his left groin.  He did have another episode of pain in the similar area that was not as intense. He has been prescribed pain medication which he has taken infrequently.   Past Medical History Problems  1. History of Acute Myocardial Infarction 2. History of Anal fissure (K60.2) 3. History of Arthritis 4. History of diabetes mellitus (Z86.39) 5. History of heartburn (Z87.898) 6. History of kidney stones (Z87.442) 7. Prostate cancer (C61)  Surgical History Problems  1. History of CABG (CABG) 2. History of Cath Stent 1 Type Drug-Eluting 3. History of Prostatectomy Robotic-Assisted 4. History of Tonsillectomy  Current Meds 1. CeleBREX 200 MG Oral Capsule;  Therapy: [id number] to Recorded 2. Diclofenac Potassium TABS;  Therapy: (Recorded:25May2016) to Recorded 3. Fluticasone Propionate 50 MCG/ACT Nasal Suspension;  Therapy: [id number] to Recorded 4. HumaLOG KwikPen 100 UNIT/ML SOLN;  Therapy: [id number] to Recorded 5. Levemir FlexPen 100 UNIT/ML SOLN;  Therapy: 26Mar2012 to Recorded 6. Levothyroxine Sodium 75 MCG Oral Tablet;  Therapy: 08Apr2013 to Recorded 7. Losartan Potassium 25 MG Oral Tablet;  Therapy: [id number] to Recorded 8. Metoprolol Succinate ER 50 MG Oral Tablet Extended Release 24 Hour;  Therapy: [id number] to Recorded 9. Nitrostat 0.4 MG Sublingual Tablet Sublingual;  Therapy: [id number] to Recorded 10. Pantoprazole Sodium 40 MG Oral Tablet Delayed Release;   Therapy: (Recorded:02Feb2010) to Recorded 11. Phazyme 60 MG TBEC;   Therapy: (Recorded:23Dec2011) to Recorded 12. Ranitidine HCl - 300 MG Oral Tablet;   Therapy: 08QPY1950 to Recorded 13. Simvastatin 40 MG Oral Tablet;   Therapy: (Recorded:02Feb2010) to Recorded 14. Stool Softener TABS;   Therapy: (Recorded:23Dec2011) to  Recorded 15. Viagra 100 MG Oral Tablet; Take as directed;   Therapy: [id number] to (Last Rx:12Jul2013)  Requested for: 12Jul2013 Ordered  Allergies Medication  1. Tetanus Toxoid Fluid SOLN  Family  History Problems  1. Family history of Acute Myocardial Infarction : Father 2. Family history of Congestive Heart Failure : Mother 3. Family history of Hematuria : Mother 53. Family history of Nephrolithiasis : Mother  Social History Problems  1. Denied: History of Alcohol Use 2. Marital History - Currently Married 3. Never A Smoker 4. Occupation:   Therapist, sports. Denied: History of Tobacco Use  Review of Systems  Genitourinary: no hematuria.  Gastrointestinal: no nausea and no vomiting.  Constitutional: no fever and no recent weight loss.  Cardiovascular: no chest pain.  Respiratory: no shortness of breath and no shortness of breath during exertion.    Vitals Vital Signs [Data Includes: Last 1 Day]  Recorded: 12Jul2016 03:57PM  Height: 6 ft 2 in Weight: 185 lb  BMI Calculated: 23.75 BSA Calculated: 2.1 Blood Pressure: 111 / 65 Temperature: 97.5 F Heart Rate: 76  Physical Exam Constitutional: Well nourished and well developed . No acute distress.  ENT:. The ears and nose are normal in appearance.  Neck: The appearance of the neck is normal and no neck mass is present.  Pulmonary: No respiratory distress and normal respiratory rhythm and effort.  Cardiovascular: Heart rate and rhythm are normal . No peripheral edema.  Abdomen: The abdomen is soft and nontender. No masses are palpated. No CVA tenderness. No hernias are palpable. No hepatosplenomegaly noted.  Skin: Normal skin turgor, no visible rash and no visible skin lesions.  Neuro/Psych:. Mood and affect are appropriate.    Results/Data Urine [Data Includes: Last 1 Day]   [id number]  COLOR YELLOW   APPEARANCE CLEAR   SPECIFIC GRAVITY 1.025   pH 5.5   GLUCOSE > 1000 mg/dL  BILIRUBIN NEG   KETONE NEG mg/dL  BLOOD NEG   PROTEIN NEG mg/dL  UROBILINOGEN 0.2 mg/dL  NITRITE NEG   LEUKOCYTE ESTERASE NEG   Selected Results  AU CT-STONE PROTOCOL [id number] 12:00AM Jesus Young   Test Name Result Flag  Reference  CT-STONE PROTOCOL (Report)    ** [facility] **   CLINICAL DATA: 69 year old male with history of micro hematuria. Additional history of prostate cancer status post prostatectomy in 2010. Pelvic discomfort for the past month. No nausea, vomiting, fever or weight loss.  EXAM: CT ABDOMEN AND PELVIS WITHOUT CONTRAST  TECHNIQUE: Multidetector CT imaging of the abdomen and pelvis was performed following the standard protocol without IV contrast.  COMPARISON: CT of the abdomen and pelvis 12/17/2008.  FINDINGS: Lower chest: Status post median sternotomy. Atherosclerotic calcifications in the right coronary artery and distal descending thoracic aorta.  Hepatobiliary: No definite cystic or solid hepatic lesions are identified within the visualized portions of the liver on today's noncontrast CT examination (the dome of the right lobe of the liver is incompletely visualized). The unenhanced appearance of the gallbladder is normal.  Pancreas: Unremarkable.  Spleen: Unremarkable.  Adrenals/Urinary Tract: Several calculi are present within the left renal collecting system, the largest of which is a 1.7 x 2.1 x 1.9 cm staghorn calculus in the upper pole collecting system. No additional calculi are identified within the collecting system of the right kidney, along the course of either ureter, or within the lumen of the urinary bladder. No hydroureteronephrosis or perinephric stranding to suggest urinary tract obstruction at this time. The  unenhanced appearance of the kidneys is otherwise unremarkable. The unenhanced appearance of the urinary bladder is generally normal, with exception of a linear high density structure immediately posterior to the symphysis pubis, which appears to be within or immediately adjacent to the anterior wall of the urinary bladder, likely related to prior prostatectomy (potentially dystrophic calcification or a  suture line). Bilateral adrenal glands are normal in appearance.  Stomach/Bowel: The unenhanced appearance of the stomach is normal. Numerous diverticulae are noted in the small bowel, particularly throughout the duodenum. No surrounding inflammatory changes. Additionally, there are numerous colonic diverticulae, particularly in the distal descending colon and sigmoid colon, without surrounding inflammatory changes to suggest an acute diverticulitis. Normal appendix.  Vascular/Lymphatic: Atherosclerosis throughout the abdominal and pelvic vasculature, without definite aneurysm. No lymphadenopathy noted in the abdomen or pelvis on today's noncontrast CT examination.  Reproductive: Status post prostatectomy. No soft tissue mass in the low anatomic pelvis to suggest local recurrence of disease.  Other: No significant volume of ascites. No pneumoperitoneum.  Musculoskeletal: Median sternotomy wires. There are no aggressive appearing lytic or blastic lesions noted in the visualized portions of the skeleton.  IMPRESSION: *Multiple nonobstructive calculi are present in the left renal collecting system, the largest of which is a 1.7 x 2.1 x 1.9 cm staghorn calculus in the upper pole collecting system of the left kidney. No ureteral stones or bladder stones are identified at this time. No signs of urinary tract obstruction. *Status post radical prostatectomy. No findings to suggest local recurrence of disease or metastatic disease in the abdomen or pelvis on today's noncontrast CT examination. *Diverticulosis of the small bowel and colon, without findings to suggest acute diverticulitis at this time.   Electronically Signed  By: Vinnie Langton M.D.  On: 04/16/2015 14:39    I reviewed his CT images.  Plan Health Maintenance  1. UA With REFLEX; [Do Not Release]; Status:Complete;   Done: [id number] 03:51PM Uric acid urolithiasis  2. Follow-up Office  Follow-up  Status: Hold For -  Appointment,Date of Service  Requested  for: 12Jul2016 3. BASIC METABOLIC PANEL; Status:In Progress - Specimen/Data Collected;   Done:  [id number] 4. VENIPUNCTURE; Status:Complete;   Done: [id number]  Discussion/Summary 1. Left partial staghorn calculus/probable uric acid stone: Considering his recent hyperkalemia and his history of hypertension, he does not appear to be an appropriate candidate for medical therapy. A basic metabolic panel will be rechecked today to check electrolytes and renal function. I have recommended that he consider proceeding with left percutaneous nephrostolithotomy for definitive treatment considering the large size of his partial staghorn stone. Considering his history of a cardiac stent, he ideally would need to remain on at least a low-dose 81 mg aspirin perioperatively to reduce his cardiac risk. It was explained that this may increase his risk of leaving complications although the overall risk is likely very low considering this low-dose aspirin. I do think these risks are outweighed by the risks of a potential cardiac complication coming off antiplatelet therapy. We have discussed the other potential risks and complications associated with this procedure including but not limited to major cardiopulmonary risk including myocardial infarction, stroke, DVT/pulmonary embolus, pneumothorax/hydrothorax, damage or injury to the spleen, pancreas, common, and other adjacent organs, risk of worsening renal function, risk of damage or loss of the kidney, need for further procedures, etc. He expressed his understanding and does wish to proceed as recommended.     We also discussed the timing of this procedure considering his recent symptoms. I am not  convinced that his symptoms are likely related to his stone considering the location of his stone and the description of his symptoms. I would like to have his renal function checked today. If his renal function is worsening, I will  recommend that he proceed with treatment in the near future. If he has persistent symptoms, we have agreed to proceed with a renal ultrasound to determine whether he truly has the development of hydronephrosis or obstruction. He also understands that we can consider placement of a preoperative stent as a consideration to temporize any situation if necessary.    2. Prostate cancer: His follow-up is due for June 2017.    Cc: Dr. Merrilee Seashore     Verified Results BASIC METABOLIC PANEL1 [id number] 04:29PM1 Read Drivers  SPECIMEN TYPE: BLOOD  [May 28, 2015 6:53PM Caisley Baxendale] Please inform Jesus Young that his kidney function is improved from his last set of labs. His potassium is improved as well. All good news. He can proceed with his kidney stone surgery electively as we had discussed without more urgency. Foster Simpson is call to schedule him for August I believe.   Test Name Result Flag Reference  GLUCOSE1 165 mg/dL1 H1 70-991  BUN1 35 mg/dL1 H1 6-231  CREATININE1 1.87 mg/dL1 H1 0.50-1.[place] mEq/L1  [phone]  POTASSIUM1 5.1 mEq/L1  3.5-5.31  CHLORIDE1 105 mEq/L1  96-1121  CO21 25 mEq/L1  19-321  CALCIUM1 9.3 mg/dL1  8.4-10.51  Est GFR, African American1 41 mL/min1 L1   Est GFR, NonAfrican American1 36 mL/min1 L1   THE ESTIMATED GFR IS A CALCULATION VALID FOR ADULTS (>=76 YEARS OLD) THAT USES THE CKD-EPI ALGORITHM TO ADJUST FOR AGE AND SEX. IT IS   NOT TO BE USED FOR CHILDREN, PREGNANT WOMEN, HOSPITALIZED PATIENTS,    PATIENTS ON DIALYSIS, OR WITH RAPIDLY CHANGING KIDNEY FUNCTION. ACCORDING TO THE NKDEP, EGFR >89 IS NORMAL, 60-89 SHOWS MILD IMPAIRMENT, 30-59 SHOWS MODERATE IMPAIRMENT, 15-29 SHOWS SEVERE IMPAIRMENT AND <15 IS ESRD.     1. Amended By: Jesus Young; May 28 2015 6:53 PM EST  Signatures Electronically signed by : Jesus Young, M.D.; May 28 2015  6:53PM EST

## 2015-06-18 ENCOUNTER — Encounter (INDEPENDENT_AMBULATORY_CARE_PROVIDER_SITE_OTHER): Payer: Self-pay

## 2015-06-18 ENCOUNTER — Encounter (HOSPITAL_COMMUNITY): Payer: Self-pay

## 2015-06-18 ENCOUNTER — Ambulatory Visit (HOSPITAL_COMMUNITY)
Admission: RE | Admit: 2015-06-18 | Discharge: 2015-06-18 | Disposition: A | Discharge status: Home / Self Care | Payer: Medicare Other | Source: Ambulatory Visit | Attending: Anesthesiology | Admitting: Anesthesiology

## 2015-06-18 ENCOUNTER — Encounter (HOSPITAL_COMMUNITY)
Admission: RE | Admit: 2015-06-18 | Discharge: 2015-06-18 | Disposition: A | Discharge status: Home / Self Care | Payer: Medicare Other | Source: Ambulatory Visit | Attending: Urology | Admitting: Urology

## 2015-06-18 DIAGNOSIS — Z01812 Encounter for preprocedural laboratory examination: Secondary | ICD-10-CM | POA: Insufficient documentation

## 2015-06-18 DIAGNOSIS — Z01810 Encounter for preprocedural cardiovascular examination: Secondary | ICD-10-CM | POA: Insufficient documentation

## 2015-06-18 DIAGNOSIS — Z951 Presence of aortocoronary bypass graft: Secondary | ICD-10-CM | POA: Insufficient documentation

## 2015-06-18 DIAGNOSIS — Z0183 Encounter for blood typing: Secondary | ICD-10-CM | POA: Diagnosis not present

## 2015-06-18 DIAGNOSIS — I2510 Atherosclerotic heart disease of native coronary artery without angina pectoris: Secondary | ICD-10-CM | POA: Diagnosis not present

## 2015-06-18 HISTORY — DX: Nocturia: R35.1

## 2015-06-18 HISTORY — DX: Dizziness and giddiness: R42

## 2015-06-18 HISTORY — DX: Unspecified fall, initial encounter: W19.XXXA

## 2015-06-18 HISTORY — DX: Personal history of other specified conditions: Z87.898

## 2015-06-18 HISTORY — DX: Bronchitis, not specified as acute or chronic: J40

## 2015-06-18 HISTORY — DX: Personal history of urinary (tract) infections: Z87.440

## 2015-06-18 LAB — BASIC METABOLIC PANEL
Anion gap: 6 (ref 5–15)
BUN: 40 mg/dL — ABNORMAL HIGH (ref 6–20)
CALCIUM: 8.9 mg/dL (ref 8.9–10.3)
CHLORIDE: 111 mmol/L (ref 101–111)
CO2: 24 mmol/L (ref 22–32)
Creatinine, Ser: 2.04 mg/dL — ABNORMAL HIGH (ref 0.61–1.24)
GFR calc Af Amer: 37 mL/min — ABNORMAL LOW (ref >60)
GFR calc non Af Amer: 32 mL/min — ABNORMAL LOW (ref >60)
Glucose, Bld: 86 mg/dL (ref 65–99)
POTASSIUM: 5.5 mmol/L — AB (ref 3.5–5.1)
Sodium: 141 mmol/L (ref 135–145)

## 2015-06-18 LAB — CBC
HCT: 43.2 % (ref 39.0–52.0)
Hemoglobin: 14 g/dL (ref 13.0–17.0)
MCH: 29.9 pg (ref 26.0–34.0)
MCHC: 32.4 g/dL (ref 30.0–36.0)
MCV: 92.1 fL (ref 78.0–100.0)
Platelets: 214 10*3/uL (ref 150–400)
RBC: 4.69 MIL/uL (ref 4.22–5.81)
RDW: 13 % (ref 11.5–15.5)
WBC: 5.8 10*3/uL (ref 4.0–10.5)

## 2015-06-18 NOTE — Patient Instructions (Addendum)
Jesus Young  06/18/2015   Your procedure is scheduled on: Monday June 23, 2015  Report to [facility]  Entrance take [place]  elevators to 3rd floor to  [place] at 11:30 AM.  Call this number if you have problems the morning of surgery [phone]   Remember: ONLY 1 PERSON MAY GO WITH YOU TO SHORT STAY TO GET  READY MORNING OF [place].  Do not eat food After Midnight but may take clear liquids till 7:00 am day of surgery then nothing by mouth.      Take these medicines the morning of surgery with A SIP OF WATER: LEVOTHYROXINE; METOPROLOL;                   TAKE 1/2 USUAL DOSE OF EVENING INSULIN NIGHT PRIOR TO SURGERY.                                You may not have any metal on your body including hair pins and              piercings  Do not wear jewelry, lotions, powders or colognes, deodorant                           Men may shave face and neck.   Do not bring valuables to the hospital. [place].  Contacts, dentures or bridgework may not be worn into surgery.  Leave suitcase in the car. After surgery it may be brought to your room.   Please read over the following fact sheets you were given:  BLOOD TRANSFUSION INFORMATION SHEET   WHAT IS A BLOOD TRANSFUSION? Blood Transfusion Information  A transfusion is the replacement of blood or some of its parts. Blood is made up of multiple cells which provide different functions.  Red blood cells carry oxygen and are used for blood loss replacement.  White blood cells fight against infection.  Platelets control bleeding.  Plasma helps clot blood.  Other blood products are available for specialized needs, such as hemophilia or other clotting disorders. BEFORE THE TRANSFUSION  Who gives blood for transfusions?   Healthy volunteers who are fully evaluated to make sure their blood is safe. This is blood bank blood. Transfusion therapy is  the safest it has ever been in the practice of medicine. Before blood is taken from a donor, a complete history is taken to make sure that person has no history of diseases nor engages in risky social behavior (examples are intravenous drug use or sexual activity with multiple partners). The donor's travel history is screened to minimize risk of transmitting infections, such as malaria. The donated blood is tested for signs of infectious diseases, such as HIV and hepatitis. The blood is then tested to be sure it is compatible with you in order to minimize the chance of a transfusion reaction. If you or a relative donates blood, this is often done in anticipation of surgery and is not appropriate for emergency situations. It takes many days to process the donated blood. RISKS AND COMPLICATIONS Although transfusion therapy is very safe and saves many lives, the main dangers of transfusion include:   Getting an infectious disease.  Developing a transfusion reaction. This is an allergic reaction to something in the blood you were given. Every precaution is taken to prevent this. The decision to have a blood transfusion has been considered carefully by your caregiver before blood is given. Blood is not given unless the benefits outweigh the risks. AFTER THE TRANSFUSION  Right after receiving a blood transfusion, you will usually feel much better and more energetic. This is especially true if your red blood cells have gotten low (anemic). The transfusion raises the level of the red blood cells which carry oxygen, and this usually causes an energy increase.  The nurse administering the transfusion will monitor you carefully for complications. HOME CARE INSTRUCTIONS  No special instructions are needed after a transfusion. You may find your energy is better. Speak with your caregiver about any limitations on activity for underlying diseases you may have. SEEK MEDICAL CARE IF:   Your condition is not  improving after your transfusion.  You develop redness or irritation at the intravenous (IV) site. SEEK IMMEDIATE MEDICAL CARE IF:  Any of the following symptoms occur over the next 12 hours:  Shaking chills.  You have a temperature by mouth above 102 F (38.9 C), not controlled by medicine.  Chest, back, or muscle pain.  People around you feel you are not acting correctly or are confused.  Shortness of breath or difficulty breathing.  Dizziness and fainting.  You get a rash or develop hives.  You have a decrease in urine output.  Your urine turns a dark color or changes to pink, red, or brown. Any of the following symptoms occur over the next 10 days:  You have a temperature by mouth above 102 F (38.9 C), not controlled by medicine.  Shortness of breath.  Weakness after normal activity.  The white part of the eye turns yellow (jaundice).  You have a decrease in the amount of urine or are urinating less often.  Your urine turns a dark color or changes to pink, red, or brown. Document Released: 10/29/2000 Document Revised: 01/24/2012 Document Reviewed: 06/17/2008 ExitCare Patient Information 2014 [employer].  _______________________________________________________________________ _____________________________________________________________________             [facility] - Preparing for Surgery Before surgery, you can play an important role.  Because skin is not sterile, your skin needs to be as free of germs as possible.  You can reduce the number of germs on your skin by washing with CHG (chlorahexidine gluconate) soap before surgery.  CHG is an antiseptic cleaner which kills germs and bonds with the skin to continue killing germs even after washing. Please DO NOT use if you have an allergy to CHG or antibacterial soaps.  If your skin becomes reddened/irritated stop using the CHG and inform your nurse when you arrive at Short Stay. Do not shave (including  legs and underarms) for at least 48 hours prior to the first CHG shower.  You may shave your face/neck. Please follow these instructions carefully:  1.  Shower with CHG Soap the night before surgery and the  morning of Surgery.  2.  If you choose to wash your hair, wash your hair first as usual with your  normal  shampoo.  3.  After you shampoo, rinse your hair and body thoroughly to remove the  shampoo.                           4.  Use CHG as  you would any other liquid soap.  You can apply chg directly  to the skin and wash                       Gently with a scrungie or clean washcloth.  5.  Apply the CHG Soap to your body ONLY FROM THE NECK DOWN.   Do not use on face/ open                           Wound or open sores. Avoid contact with eyes, ears mouth and genitals (private parts).                       Wash face,  Genitals (private parts) with your normal soap.             6.  Wash thoroughly, paying special attention to the area where your surgery  will be performed.  7.  Thoroughly rinse your body with warm water from the neck down.  8.  DO NOT shower/wash with your normal soap after using and rinsing off  the CHG Soap.                9.  Pat yourself dry with a clean towel.            10.  Wear clean pajamas.            11.  Place clean sheets on your bed the night of your first shower and do not  sleep with pets. Day of Surgery : Do not apply any lotions/deodorants the morning of surgery.  Please wear clean clothes to the hospital/surgery center.  FAILURE TO FOLLOW THESE INSTRUCTIONS MAY RESULT IN THE CANCELLATION OF YOUR SURGERY PATIENT SIGNATURE_________________________________  NURSE SIGNATURE__________________________________  ________________________________________________________________________    CLEAR LIQUID DIET   Foods Allowed                                                                     Foods Excluded  Coffee and tea, regular and decaf                              liquids that you cannot  Plain Jell-O in any flavor                                             see through such as: Fruit ices (not with fruit pulp)                                     milk, soups, orange juice  Iced Popsicles                                    All solid food Carbonated beverages, regular and diet  Cranberry, grape and apple juices Sports drinks like Gatorade Lightly seasoned clear broth or consume(fat free) Sugar, honey syrup  Sample Menu Breakfast                                Lunch                                     Supper Cranberry juice                    Beef broth                            Chicken broth Jell-O                                     Grape juice                           Apple juice Coffee or tea                        Jell-O                                      Popsicle                                                Coffee or tea                        Coffee or tea  _____________________________________________________________________

## 2015-06-18 NOTE — Progress Notes (Signed)
EKG per epic 05/10/2015  H&P on chart per Dr Ganji/cardiology 06/24/2014 discussed heart history

## 2015-06-19 NOTE — Progress Notes (Signed)
BMP results in epic per PAT visit 06/19/2015 sent to Dr Alinda Money

## 2015-06-20 ENCOUNTER — Other Ambulatory Visit: Payer: Self-pay | Admitting: Radiology

## 2015-06-20 NOTE — Progress Notes (Addendum)
Contacted Dr Lynne Logan office in regards to pts BMP results/epic per PAT visit 06/18/2015. Spoke with Roselyn Reef. MD office has received results. Roselyn Reef reported Dr Alinda Money is aware of results.

## 2015-06-23 ENCOUNTER — Ambulatory Visit (HOSPITAL_COMMUNITY)
Admission: RE | Admit: 2015-06-23 | Discharge: 2015-06-23 | Disposition: A | Discharge status: Home / Self Care | Payer: Medicare Other | Source: Ambulatory Visit | Attending: Urology | Admitting: Urology

## 2015-06-23 ENCOUNTER — Ambulatory Visit (HOSPITAL_COMMUNITY): Payer: Medicare Other | Admitting: Certified Registered Nurse Anesthetist

## 2015-06-23 ENCOUNTER — Ambulatory Visit (HOSPITAL_COMMUNITY): Payer: Medicare Other

## 2015-06-23 ENCOUNTER — Encounter (HOSPITAL_COMMUNITY)
Admission: RE | Disposition: A | Discharge status: Home / Self Care | Payer: Self-pay | Source: Ambulatory Visit | Attending: Urology

## 2015-06-23 ENCOUNTER — Observation Stay (HOSPITAL_COMMUNITY)
Admission: RE | Admit: 2015-06-23 | Discharge: 2015-06-24 | Disposition: A | Discharge status: Home / Self Care | Payer: Medicare Other | Source: Ambulatory Visit | Attending: Urology | Admitting: Urology

## 2015-06-23 ENCOUNTER — Encounter (HOSPITAL_COMMUNITY): Payer: Self-pay

## 2015-06-23 DIAGNOSIS — Z936 Other artificial openings of urinary tract status: Secondary | ICD-10-CM | POA: Insufficient documentation

## 2015-06-23 DIAGNOSIS — N200 Calculus of kidney: Principal | ICD-10-CM | POA: Insufficient documentation

## 2015-06-23 DIAGNOSIS — Z87440 Personal history of urinary (tract) infections: Secondary | ICD-10-CM | POA: Diagnosis not present

## 2015-06-23 DIAGNOSIS — I2510 Atherosclerotic heart disease of native coronary artery without angina pectoris: Secondary | ICD-10-CM | POA: Insufficient documentation

## 2015-06-23 DIAGNOSIS — E785 Hyperlipidemia, unspecified: Secondary | ICD-10-CM | POA: Insufficient documentation

## 2015-06-23 DIAGNOSIS — M549 Dorsalgia, unspecified: Secondary | ICD-10-CM | POA: Insufficient documentation

## 2015-06-23 DIAGNOSIS — Z7902 Long term (current) use of antithrombotics/antiplatelets: Secondary | ICD-10-CM | POA: Diagnosis not present

## 2015-06-23 DIAGNOSIS — M1990 Unspecified osteoarthritis, unspecified site: Secondary | ICD-10-CM | POA: Insufficient documentation

## 2015-06-23 DIAGNOSIS — Z5181 Encounter for therapeutic drug level monitoring: Secondary | ICD-10-CM | POA: Insufficient documentation

## 2015-06-23 DIAGNOSIS — E039 Hypothyroidism, unspecified: Secondary | ICD-10-CM | POA: Diagnosis not present

## 2015-06-23 DIAGNOSIS — E875 Hyperkalemia: Secondary | ICD-10-CM | POA: Diagnosis not present

## 2015-06-23 DIAGNOSIS — Z8546 Personal history of malignant neoplasm of prostate: Secondary | ICD-10-CM | POA: Diagnosis not present

## 2015-06-23 DIAGNOSIS — K219 Gastro-esophageal reflux disease without esophagitis: Secondary | ICD-10-CM | POA: Insufficient documentation

## 2015-06-23 DIAGNOSIS — G8929 Other chronic pain: Secondary | ICD-10-CM | POA: Insufficient documentation

## 2015-06-23 DIAGNOSIS — I252 Old myocardial infarction: Secondary | ICD-10-CM | POA: Diagnosis not present

## 2015-06-23 DIAGNOSIS — Z79899 Other long term (current) drug therapy: Secondary | ICD-10-CM | POA: Diagnosis not present

## 2015-06-23 DIAGNOSIS — Z951 Presence of aortocoronary bypass graft: Secondary | ICD-10-CM | POA: Diagnosis not present

## 2015-06-23 DIAGNOSIS — E119 Type 2 diabetes mellitus without complications: Secondary | ICD-10-CM | POA: Insufficient documentation

## 2015-06-23 DIAGNOSIS — N209 Urinary calculus, unspecified: Secondary | ICD-10-CM | POA: Diagnosis not present

## 2015-06-23 DIAGNOSIS — I10 Essential (primary) hypertension: Secondary | ICD-10-CM | POA: Insufficient documentation

## 2015-06-23 DIAGNOSIS — Z9889 Other specified postprocedural states: Secondary | ICD-10-CM | POA: Diagnosis not present

## 2015-06-23 DIAGNOSIS — Z87442 Personal history of urinary calculi: Secondary | ICD-10-CM | POA: Diagnosis not present

## 2015-06-23 DIAGNOSIS — Z887 Allergy status to serum and vaccine status: Secondary | ICD-10-CM | POA: Diagnosis not present

## 2015-06-23 DIAGNOSIS — Z9079 Acquired absence of other genital organ(s): Secondary | ICD-10-CM | POA: Diagnosis not present

## 2015-06-23 DIAGNOSIS — Z794 Long term (current) use of insulin: Secondary | ICD-10-CM | POA: Insufficient documentation

## 2015-06-23 HISTORY — PX: NEPHROLITHOTOMY: SHX5134

## 2015-06-23 LAB — BASIC METABOLIC PANEL
ANION GAP: 4 — AB (ref 5–15)
Anion gap: 4 — ABNORMAL LOW (ref 5–15)
BUN: 35 mg/dL — AB (ref 6–20)
BUN: 29 mg/dL — AB (ref 6–20)
CALCIUM: 8.2 mg/dL — AB (ref 8.9–10.3)
CHLORIDE: 108 mmol/L (ref 101–111)
CO2: 23 mmol/L (ref 22–32)
CO2: 23 mmol/L (ref 22–32)
CREATININE: 1.56 mg/dL — AB (ref 0.61–1.24)
Calcium: 9 mg/dL (ref 8.9–10.3)
Chloride: 108 mmol/L (ref 101–111)
Creatinine, Ser: 1.62 mg/dL — ABNORMAL HIGH (ref 0.61–1.24)
GFR calc Af Amer: 48 mL/min — ABNORMAL LOW (ref >60)
GFR calc Af Amer: 51 mL/min — ABNORMAL LOW (ref >60)
GFR, EST NON AFRICAN AMERICAN: 42 mL/min — AB (ref >60)
GFR, EST NON AFRICAN AMERICAN: 44 mL/min — AB (ref >60)
GLUCOSE: 208 mg/dL — AB (ref 65–99)
Glucose, Bld: 195 mg/dL — ABNORMAL HIGH (ref 65–99)
Potassium: 5.3 mmol/L — ABNORMAL HIGH (ref 3.5–5.1)
Potassium: 4.9 mmol/L (ref 3.5–5.1)
Sodium: 135 mmol/L (ref 135–145)
Sodium: 135 mmol/L (ref 135–145)

## 2015-06-23 LAB — CBC WITH DIFFERENTIAL/PLATELET
BASOS ABS: 0 10*3/uL (ref 0.0–0.1)
BASOS PCT: 1 % (ref 0–1)
EOS ABS: 0.3 10*3/uL (ref 0.0–0.7)
Eosinophils Relative: 6 % — ABNORMAL HIGH (ref 0–5)
HEMATOCRIT: 44.3 % (ref 39.0–52.0)
Hemoglobin: 14.1 g/dL (ref 13.0–17.0)
Lymphocytes Relative: 18 % (ref 12–46)
Lymphs Abs: 0.8 10*3/uL (ref 0.7–4.0)
MCH: 29.7 pg (ref 26.0–34.0)
MCHC: 31.8 g/dL (ref 30.0–36.0)
MCV: 93.3 fL (ref 78.0–100.0)
Monocytes Absolute: 0.5 10*3/uL (ref 0.1–1.0)
Monocytes Relative: 11 % (ref 3–12)
NEUTROS ABS: 2.8 10*3/uL (ref 1.7–7.7)
Neutrophils Relative %: 64 % (ref 43–77)
Platelets: 190 10*3/uL (ref 150–400)
RBC: 4.75 MIL/uL (ref 4.22–5.81)
RDW: 13 % (ref 11.5–15.5)
WBC: 4.3 10*3/uL (ref 4.0–10.5)

## 2015-06-23 LAB — GLUCOSE, CAPILLARY
GLUCOSE-CAPILLARY: 172 mg/dL — AB (ref 65–99)
Glucose-Capillary: 156 mg/dL — ABNORMAL HIGH (ref 65–99)
Glucose-Capillary: 177 mg/dL — ABNORMAL HIGH (ref 65–99)

## 2015-06-23 LAB — PROTIME-INR
INR: 0.94 (ref 0.00–1.49)
PROTHROMBIN TIME: 12.8 s (ref 11.6–15.2)

## 2015-06-23 LAB — TYPE AND SCREEN
ABO/RH(D): A POS
Antibody Screen: NEGATIVE

## 2015-06-23 LAB — HEMOGLOBIN AND HEMATOCRIT, BLOOD
HCT: 41.8 % (ref 39.0–52.0)
HEMOGLOBIN: 13.2 g/dL (ref 13.0–17.0)

## 2015-06-23 SURGERY — NEPHROLITHOTOMY PERCUTANEOUS
Anesthesia: General | Laterality: Left

## 2015-06-23 MED ORDER — HYDROCODONE-ACETAMINOPHEN 5-325 MG PO TABS: 1.0000 | ORAL_TABLET | Freq: Four times a day (QID) | ORAL | Status: DC | PRN

## 2015-06-23 MED ORDER — ONDANSETRON HCL 4 MG/2ML IJ SOLN
INTRAMUSCULAR | Status: DC | PRN
  Administered 2015-06-23: 4 mg via INTRAVENOUS

## 2015-06-23 MED ORDER — DIPHENHYDRAMINE HCL 12.5 MG/5ML PO ELIX: 12.5000 mg | ORAL_SOLUTION | Freq: Four times a day (QID) | ORAL | Status: DC | PRN

## 2015-06-23 MED ORDER — MIDAZOLAM HCL 2 MG/2ML IJ SOLN
INTRAMUSCULAR | Status: AC
  Filled 2015-06-23: qty 6

## 2015-06-23 MED ORDER — CIPROFLOXACIN IN D5W 400 MG/200ML IV SOLN
INTRAVENOUS | Status: AC
  Filled 2015-06-23: qty 200

## 2015-06-23 MED ORDER — IOHEXOL 300 MG/ML  SOLN
15.0000 mL | Freq: Once | INTRAMUSCULAR | Status: DC | PRN
  Administered 2015-06-23: 15 mL
  Filled 2015-06-23: qty 20

## 2015-06-23 MED ORDER — SODIUM CHLORIDE 0.9 % IR SOLN
Status: DC | PRN
  Administered 2015-06-23: 12000 mL

## 2015-06-23 MED ORDER — FENTANYL CITRATE (PF) 100 MCG/2ML IJ SOLN
INTRAMUSCULAR | Status: DC | PRN
  Administered 2015-06-23 (×3): 50 ug via INTRAVENOUS

## 2015-06-23 MED ORDER — ASPIRIN EC 81 MG PO TBEC
81.0000 mg | DELAYED_RELEASE_TABLET | Freq: Every day | ORAL | Status: DC
  Administered 2015-06-23: 81 mg via ORAL
  Filled 2015-06-23: qty 1

## 2015-06-23 MED ORDER — HYDROMORPHONE HCL 1 MG/ML IJ SOLN
INTRAMUSCULAR | Status: AC
  Filled 2015-06-23: qty 1

## 2015-06-23 MED ORDER — ONDANSETRON HCL 4 MG/2ML IJ SOLN
INTRAMUSCULAR | Status: AC
  Filled 2015-06-23: qty 2

## 2015-06-23 MED ORDER — SIMVASTATIN 40 MG PO TABS
40.0000 mg | ORAL_TABLET | Freq: Every evening | ORAL | Status: DC
  Administered 2015-06-23: 40 mg via ORAL
  Filled 2015-06-23: qty 1

## 2015-06-23 MED ORDER — DOCUSATE SODIUM 100 MG PO CAPS
100.0000 mg | ORAL_CAPSULE | Freq: Two times a day (BID) | ORAL | Status: DC
  Administered 2015-06-23 – 2015-06-24 (×2): 100 mg via ORAL
  Filled 2015-06-23 (×2): qty 1

## 2015-06-23 MED ORDER — LIDOCAINE HCL (CARDIAC) 20 MG/ML IV SOLN
INTRAVENOUS | Status: AC
  Filled 2015-06-23: qty 5

## 2015-06-23 MED ORDER — SODIUM CHLORIDE 0.45 % IV SOLN
INTRAVENOUS | Status: DC
  Administered 2015-06-23 – 2015-06-24 (×2): via INTRAVENOUS

## 2015-06-23 MED ORDER — MIDAZOLAM HCL 2 MG/2ML IJ SOLN
INTRAMUSCULAR | Status: AC | PRN
  Administered 2015-06-23: 0.5 mg via INTRAVENOUS
  Administered 2015-06-23: 1 mg via INTRAVENOUS

## 2015-06-23 MED ORDER — INSULIN ASPART 100 UNIT/ML ~~LOC~~ SOLN
0.0000 [IU] | SUBCUTANEOUS | Status: DC
  Administered 2015-06-23 – 2015-06-24 (×3): 3 [IU] via SUBCUTANEOUS

## 2015-06-23 MED ORDER — LIDOCAINE HCL (CARDIAC) 20 MG/ML IV SOLN
INTRAVENOUS | Status: DC | PRN
  Administered 2015-06-23: 50 mg via INTRAVENOUS

## 2015-06-23 MED ORDER — LIDOCAINE HCL 1 % IJ SOLN
INTRAMUSCULAR | Status: AC
  Filled 2015-06-23: qty 20

## 2015-06-23 MED ORDER — CEFAZOLIN SODIUM-DEXTROSE 2-3 GM-% IV SOLR
INTRAVENOUS | Status: AC
  Administered 2015-06-23: 2 g via INTRAVENOUS
  Filled 2015-06-23: qty 50

## 2015-06-23 MED ORDER — PANTOPRAZOLE SODIUM 40 MG PO TBEC
40.0000 mg | DELAYED_RELEASE_TABLET | Freq: Every day | ORAL | Status: DC
  Administered 2015-06-24: 40 mg via ORAL
  Filled 2015-06-23: qty 1

## 2015-06-23 MED ORDER — LEVOTHYROXINE SODIUM 25 MCG PO TABS
75.0000 ug | ORAL_TABLET | Freq: Every day | ORAL | Status: DC
  Administered 2015-06-24: 75 ug via ORAL
  Filled 2015-06-23: qty 3

## 2015-06-23 MED ORDER — SUCCINYLCHOLINE CHLORIDE 20 MG/ML IJ SOLN
INTRAMUSCULAR | Status: DC | PRN
  Administered 2015-06-23: 80 mg via INTRAVENOUS

## 2015-06-23 MED ORDER — CIPROFLOXACIN IN D5W 400 MG/200ML IV SOLN: 400.0000 mg | Freq: Once | INTRAVENOUS | Status: DC

## 2015-06-23 MED ORDER — FENTANYL CITRATE (PF) 100 MCG/2ML IJ SOLN
INTRAMUSCULAR | Status: AC
  Filled 2015-06-23: qty 4

## 2015-06-23 MED ORDER — DIPHENHYDRAMINE HCL 50 MG/ML IJ SOLN: 12.5000 mg | Freq: Four times a day (QID) | INTRAMUSCULAR | Status: DC | PRN

## 2015-06-23 MED ORDER — HYDROCODONE-ACETAMINOPHEN 5-325 MG PO TABS: 1.0000 | ORAL_TABLET | ORAL | Status: DC | PRN

## 2015-06-23 MED ORDER — HYDROMORPHONE HCL 1 MG/ML IJ SOLN
0.2500 mg | INTRAMUSCULAR | Status: DC | PRN
  Administered 2015-06-23 (×4): 0.5 mg via INTRAVENOUS

## 2015-06-23 MED ORDER — FENTANYL CITRATE (PF) 250 MCG/5ML IJ SOLN
INTRAMUSCULAR | Status: AC
  Filled 2015-06-23: qty 25

## 2015-06-23 MED ORDER — DOCUSATE SODIUM 100 MG PO CAPS: 100.0000 mg | ORAL_CAPSULE | Freq: Two times a day (BID) | ORAL | Status: DC

## 2015-06-23 MED ORDER — CIPROFLOXACIN IN D5W 400 MG/200ML IV SOLN
400.0000 mg | INTRAVENOUS | Status: AC
  Administered 2015-06-23: 400 mg via INTRAVENOUS

## 2015-06-23 MED ORDER — PROPOFOL 10 MG/ML IV BOLUS
INTRAVENOUS | Status: AC
  Filled 2015-06-23: qty 20

## 2015-06-23 MED ORDER — CEFAZOLIN SODIUM 1-5 GM-% IV SOLN
1.0000 g | Freq: Three times a day (TID) | INTRAVENOUS | Status: AC
  Administered 2015-06-23 – 2015-06-24 (×2): 1 g via INTRAVENOUS
  Filled 2015-06-23 (×2): qty 50

## 2015-06-23 MED ORDER — HYDROMORPHONE HCL 1 MG/ML IJ SOLN: 0.5000 mg | INTRAMUSCULAR | Status: DC | PRN

## 2015-06-23 MED ORDER — PHENYLEPHRINE HCL 10 MG/ML IJ SOLN
INTRAMUSCULAR | Status: DC | PRN
  Administered 2015-06-23: 40 ug via INTRAVENOUS

## 2015-06-23 MED ORDER — PROPOFOL 10 MG/ML IV BOLUS
INTRAVENOUS | Status: DC | PRN
  Administered 2015-06-23: 80 mg via INTRAVENOUS

## 2015-06-23 MED ORDER — MIDAZOLAM HCL 2 MG/2ML IJ SOLN
INTRAMUSCULAR | Status: AC
  Filled 2015-06-23: qty 4

## 2015-06-23 MED ORDER — METOPROLOL TARTRATE 50 MG PO TABS
50.0000 mg | ORAL_TABLET | Freq: Two times a day (BID) | ORAL | Status: DC
  Administered 2015-06-23 – 2015-06-24 (×2): 50 mg via ORAL
  Filled 2015-06-23 (×2): qty 1

## 2015-06-23 MED ORDER — LOSARTAN POTASSIUM 50 MG PO TABS
25.0000 mg | ORAL_TABLET | Freq: Every day | ORAL | Status: DC
  Administered 2015-06-23: 25 mg via ORAL
  Filled 2015-06-23: qty 1

## 2015-06-23 MED ORDER — IOHEXOL 300 MG/ML  SOLN
INTRAMUSCULAR | Status: DC | PRN
  Administered 2015-06-23: 30 mL

## 2015-06-23 MED ORDER — FENTANYL CITRATE (PF) 100 MCG/2ML IJ SOLN
INTRAMUSCULAR | Status: AC | PRN
  Administered 2015-06-23: 50 ug via INTRAVENOUS

## 2015-06-23 MED ORDER — SODIUM CHLORIDE 0.9 % IV SOLN
INTRAVENOUS | Status: DC
  Administered 2015-06-23 (×2): via INTRAVENOUS

## 2015-06-23 MED ORDER — ONDANSETRON HCL 4 MG/2ML IJ SOLN: 4.0000 mg | Freq: Once | INTRAMUSCULAR | Status: DC | PRN

## 2015-06-23 SURGICAL SUPPLY — 47 items
BAG URINE DRAINAGE (UROLOGICAL SUPPLIES) ×2 IMPLANT
BASKET STONE NITINOL 3FRX115MB (UROLOGICAL SUPPLIES) IMPLANT
BASKET ZERO TIP 1.9FR (BASKET) ×2 IMPLANT
BENZOIN TINCTURE PRP APPL 2/3 (GAUZE/BANDAGES/DRESSINGS) ×4 IMPLANT
CATH FOLEY 2W COUNCIL 20FR 5CC (CATHETERS) ×2 IMPLANT
CATH FOLEY 2WAY SLVR  5CC 18FR (CATHETERS) ×1
CATH FOLEY 2WAY SLVR 5CC 18FR (CATHETERS) ×1 IMPLANT
CATH ROBINSON RED A/P 20FR (CATHETERS) IMPLANT
CATH X-FORCE N30 NEPHROSTOMY (TUBING) ×2 IMPLANT
COVER SURGICAL LIGHT HANDLE (MISCELLANEOUS) ×2 IMPLANT
DRAPE C-ARM 42X120 X-RAY (DRAPES) ×2 IMPLANT
DRAPE CAMERA CLOSED 9X96 (DRAPES) ×2 IMPLANT
DRAPE LINGEMAN PERC (DRAPES) ×2 IMPLANT
DRAPE SURG IRRIG POUCH 19X23 (DRAPES) ×2 IMPLANT
DRAPE UTILITY XL STRL (DRAPES) IMPLANT
DRSG PAD ABDOMINAL 8X10 ST (GAUZE/BANDAGES/DRESSINGS) ×2 IMPLANT
DRSG TEGADERM 8X12 (GAUZE/BANDAGES/DRESSINGS) ×2 IMPLANT
FIBER LASER FLEXIVA 1000 (UROLOGICAL SUPPLIES) IMPLANT
FIBER LASER FLEXIVA 200 (UROLOGICAL SUPPLIES) IMPLANT
FIBER LASER FLEXIVA 365 (UROLOGICAL SUPPLIES) IMPLANT
FIBER LASER FLEXIVA 550 (UROLOGICAL SUPPLIES) IMPLANT
FIBER LASER TRAC TIP (UROLOGICAL SUPPLIES) IMPLANT
GAUZE SPONGE 4X4 12PLY STRL (GAUZE/BANDAGES/DRESSINGS) ×2 IMPLANT
GLOVE BIOGEL M STRL SZ7.5 (GLOVE) ×2 IMPLANT
GOWN STRL REUS W/TWL LRG LVL3 (GOWN DISPOSABLE) ×4 IMPLANT
GUIDEWIRE AMPLAZ .035X145 (WIRE) ×4 IMPLANT
KIT BASIN OR (CUSTOM PROCEDURE TRAY) ×2 IMPLANT
MANIFOLD NEPTUNE II (INSTRUMENTS) ×2 IMPLANT
NS IRRIG 1000ML POUR BTL (IV SOLUTION) IMPLANT
PACK BASIC VI WITH GOWN DISP (CUSTOM PROCEDURE TRAY) ×2 IMPLANT
PROBE LITHOCLAST ULTRA 3.8X403 (UROLOGICAL SUPPLIES) IMPLANT
PROBE PNEUMATIC 1.0MMX570MM (UROLOGICAL SUPPLIES) IMPLANT
SET IRRIG Y TYPE TUR BLADDER L (SET/KITS/TRAYS/PACK) ×2 IMPLANT
SET WARMING FLUID IRRIGATION (MISCELLANEOUS) IMPLANT
SHEATH PEELAWAY SET 9 (SHEATH) ×2 IMPLANT
SPONGE LAP 4X18 X RAY DECT (DISPOSABLE) ×2 IMPLANT
STENT ENDOURETEROTOMY 7-14 26C (STENTS) IMPLANT
STONE CATCHER W/TUBE ADAPTER (UROLOGICAL SUPPLIES) IMPLANT
SUT SILK 2 0 30  PSL (SUTURE) ×1
SUT SILK 2 0 30 PSL (SUTURE) ×1 IMPLANT
SYR 20CC LL (SYRINGE) ×4 IMPLANT
SYRINGE 10CC LL (SYRINGE) ×2 IMPLANT
TOWEL OR NON WOVEN STRL DISP B (DISPOSABLE) ×2 IMPLANT
TRAY FOLEY W/METER SILVER 14FR (SET/KITS/TRAYS/PACK) ×2 IMPLANT
TRAY FOLEY W/METER SILVER 16FR (SET/KITS/TRAYS/PACK) IMPLANT
TUBING CONNECTING 10 (TUBING) ×6 IMPLANT
WATER STERILE IRR 1500ML POUR (IV SOLUTION) IMPLANT

## 2015-06-23 NOTE — Anesthesia Postprocedure Evaluation (Signed)
  Anesthesia Post-op Note  Patient: Jesus Young  Procedure(s) Performed: Procedure(s) (LRB): NEPHROLITHOTOMY PERCUTANEOUS (Left)  Patient Location: PACU  Anesthesia Type: General  Level of Consciousness: awake and alert   Airway and Oxygen Therapy: Patient Spontanous Breathing  Post-op Pain: mild  Post-op Assessment: Post-op Vital signs reviewed, Patient's Cardiovascular Status Stable, Respiratory Function Stable, Patent Airway and No signs of Nausea or vomiting  Last Vitals:  Filed Vitals:   06/23/15 1755  BP: 140/61  Pulse:   Temp:   Resp:     Post-op Vital Signs: stable   Complications: No apparent anesthesia complications

## 2015-06-23 NOTE — Anesthesia Preprocedure Evaluation (Addendum)
Anesthesia Evaluation   Patient awake    Reviewed: Allergy & Precautions, H&P , NPO status , Patient's Chart, lab work & pertinent test results  History of Anesthesia Complications Negative for: history of anesthetic complications  Airway Mallampati: II  TM Distance: >3 FB Neck ROM: Full    Dental  (+) Teeth Intact, Dental Advisory Given   Pulmonary neg pulmonary ROS,    Pulmonary exam normal       Cardiovascular hypertension, Pt. on home beta blockers + CAD, + Past MI, + Cardiac Stents (pt had LAD stent placed within 1 year s/p CABG, been followed by cardiology since then with no issues, on aspirin and plavix usually, has been off plavix for 8 days) and + CABG Normal cardiovascular exam Recent stress test (2014): Nml EF, neg for ischemia, low risk study.. Note in chart from Dr. Einar Gip (Cardiologist), he has METS>4 and no new signs of unstable angina or syncope, no signs of heart failure   Neuro/Psych negative psych ROS   GI/Hepatic Neg liver ROS, GERD-  Medicated,  Endo/Other  diabetes, Insulin DependentHypothyroidism   Renal/GU Renal InsufficiencyRenal disease     Musculoskeletal   Abdominal   Peds  Hematology   Anesthesia Other Findings   Reproductive/Obstetrics                            Anesthesia Physical  Anesthesia Plan  ASA: III  Anesthesia Plan: General   Post-op Pain Management:    Induction: Intravenous  Airway Management Planned: Oral ETT  Additional Equipment:   Intra-op Plan:   Post-operative Plan: Extubation in OR  Informed Consent: I have reviewed the patients History and Physical, chart, labs and discussed the procedure including the risks, benefits and alternatives for the proposed anesthesia with the patient or authorized representative who has indicated his/her understanding and acceptance.   Dental advisory given  Plan Discussed with: CRNA, Anesthesiologist  and Surgeon  Anesthesia Plan Comments:         Anesthesia Quick Evaluation

## 2015-06-23 NOTE — Op Note (Signed)
Preoperative diagnosis: Left renal calculus (greater than 2 cm)  Postoperative diagnosis: Left renal calculus ( greater than 2 cm)  Procedure: Left percutaneous nephrostolithotomy (> 2 cm)  Surgeon: Dr. Roxy Horseman, Brooke Bonito.  Anesthesia: General  Complications: None  EBL: 50 cc  Specimens: Left renal calculi  Disposition of specimens: [facility] for stone analysis  Indication: Mr. 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  He was unable to tolerate medical therapy due to issues related to hyperkalemia with potassium citrate and a history of hypertension precluding the use of sodium-containing products. After further discussion, he agreed to proceed with surgical intervention.  Considering the large stone size (2.1 cm), I recommended proceeding with percutaneous nephrostolithotomy.  We reviewed the potential risks, complications, and expected recovery process associated with this procedure.  He gave his informed consent to proceed.  Description of procedure:  He was initially taken to the interventional radiology suite and underwent left percutaneous access by Dr. Pascal Lux.  Dr. Pascal Lux fortunately was able to obtain excellent access into the upper pole of the left kidney despite the radiolucency of his stones.  He was then transferred to the operating room and underwent a general anesthetic.  He was administered preoperative antibiotics, placed in the prone position, and prepped and draped in the usual sterile fashion.  Preoperative timeout was performed.  The patient's access site was noted with an indwelling 5 French angiographic catheter.  This extended all the way down to the bladder.  A stiff guidewire was then placed through the 5 French catheter and secured into the bladder.  The angiographic catheter was removed.  A coaxial catheter was then placed over the working wire and a new 0.38 glide wire was  inserted into the ureter and down into the bladder under fluoroscopic guidance.  The angiographic catheter was then passed over the Glidewire and this was removed and exchanged for another stiff guidewire.  This left 2 wires in place one as a working wire and one as a Chiropodist.  Over the working wire, a 85 French dilating balloon catheter was placed over the wire just into the distal aspect of the upper pole calyx.  This was after the skin incision was slightly extended with a knife.  The balloon was then dilated to 16 mmHg pressure.  The sheath was then passed over the balloon to the edge of the calyx.  The balloon was then removed.  Visualization was excellent and the stone was immediately identified.  All stone was then fragmented and removed with a combination of techniques.  Initially, ultrasonic lithotripsy was performed allowing the stone to be fragmented and many of the smaller stone fragments to be removed via suction.  I then used a grasper to remove a few larger stone fragments.  Additional smaller stone fragments were then again removed with ultrasonic lithotripsy and suction.  Once the majority of the stones were removed, I used a flexible nephroscope to examine the remainder of the collecting system.  There were a few small stone fragments in the lower pole calyx that were removed with a 0 tip nitinol basket.  Other stones were flushed back into the main renal collecting system and then subsequently removed with suction via the lithoclast.  Repeat endoscopy with both the rigid and flexible nephroscopes revealed no further stone fragments that appeared visible.  A 20 [place] council tip catheter was then passed over the working wire into the  renal pelvis.  The nephrostomy sheath was removed.  2 cc of sterile saline was instilled into the balloon of the catheter.  This catheter was then also secured at skin level with 2-0 silk sutures.  Fluoroscopy confirmed appropriate positioning.  Injection of  Omnipaque revealed no extravasation and no further filling defects to suggest remaining stone burden.  I then replaced the 5 French angiographic catheter over the safety wire and this was secured to the skin after having passed it down to the distal ureter.  A sterile dressing was applied.  The patient's nephrostomy tube was placed to straight drainage.  He tolerated the procedure well without complications.  He was able to be extubated and transferred to the recovery unit in satisfactory condition.

## 2015-06-23 NOTE — Procedures (Signed)
Successful placement of left sided 5 Fr catheter for PNL access.  Tip of catheter is within the urinary bladder.  No immediate complications.

## 2015-06-23 NOTE — Interval H&P Note (Signed)
History and Physical Interval Note:  06/23/2015 2:29 PM  Jesus Young  has presented today for surgery, with the diagnosis of LEFT RENAL CALCULUS  The various methods of treatment have been discussed with the patient and family. After consideration of risks, benefits and other options for treatment, the patient has consented to  Procedure(s): NEPHROLITHOTOMY PERCUTANEOUS (Left) HOLMIUM LASER APPLICATION (Left) as a surgical intervention .  The patient's history has been reviewed, patient examined, no change in status, stable for surgery.  I have reviewed the patient's chart and labs.  Questions were answered to the patient's satisfaction.     Gabrielle Wakeland,LES

## 2015-06-23 NOTE — H&P (Signed)
Chief Complaint: Patient was seen in consultation today for left percutaneous nephrostomy/nephroureteral catheter placement   Referring Physician(s): Borden,Lester  History of Present Illness: Jesus Young is a 68 y.o. male with history of prostate cancer, status post radical prostatectomy 10/2009, and uric acid nephrolithiasis. The patient has been unable to tolerate medical therapy for his nephrolithiasis secondary to hyperkalemia and hypertension. Recent imaging has revealed a large partial staghorn left renal calculus. Patient has had some intermittent left flank pain but currently is asymptomatic. Creatinine today is 1.62 , previously 2.04 on 06/18/15. He presents today for left percutaneous nephroureteral catheter placement prior to nephrolithotomy.  Past Medical History  Diagnosis Date  . Diabetes mellitus without complication     takes Levemir and Humalog daily  . GERD (gastroesophageal reflux disease)     takes Protonix daily and Zantac  . Hypertension     takes Metoprolol daily  . Hypothyroidism     takes Synthroid daily  . Hyperlipidemia     takes Zocor every evening  . Night muscle spasms     takes Robaxin 4 x day   . Constipation     takes Colace at bedtime  . Cancer     prostate  . Myocardial infarction     many yrs before 2002  . Arthritis     back and knees  . Chronic back pain   . Coronary artery disease     takes Plavix daily but is on hold for surgery  . Hemorrhoids   . History of kidney stones   . Cataract     immature on right  . Bronchitis     hx of 2015  . Dizziness   . H/O urinary frequency   . History of bladder infections   . Nocturia   . Fall     hx of with injury to left shoulder     Past Surgical History  Procedure Laterality Date  . Tonsillectomy    . Coronary artery bypass graft  2002    x 5  . Prostatectomy  10/2009  . Cardiac catheterization  05/17/02  . Coronary angioplasty      x 1   . Colonoscopy    . Skin spots  removed from back -precancerous    . Lumbar laminectomy/decompression microdiscectomy Bilateral 07/18/2013    Procedure: Bilateral Lumbar four-five Lumbar Laminotomy/Microdiskectomy;  Surgeon: Hosie Spangle, MD;  Location: [facility];  Service: Neurosurgery;  Laterality: Bilateral;  Bilateral Lumbar four-five Lumbar Laminotomy/Microdiskectomy  . Eyelid surgery       Allergies: Review of patient's allergies indicates no known allergies.  Medications: Prior to Admission medications   Medication Sig Start Date End Date Taking? Authorizing Provider  aspirin EC 81 MG tablet Take 81 mg by mouth at bedtime.     Historical Provider, MD  celecoxib (CELEBREX) 200 MG capsule Take 200 mg by mouth at bedtime.     Historical Provider, MD  clopidogrel (PLAVIX) 75 MG tablet Take 75 mg by mouth at bedtime.     Historical Provider, MD  docusate sodium (COLACE) 100 MG capsule Take 100-300 mg by mouth at bedtime as needed for mild constipation (constipation).     Historical Provider, MD  insulin detemir (LEVEMIR) 100 UNIT/ML injection Inject 14-16 Units into the skin 2 (two) times daily. 16 units in the am, and 14 units in the pm.    Historical Provider, MD  insulin lispro (HUMALOG) 100 UNIT/ML injection Inject 5-9 Units into the  skin 2 (two) times daily. 5 units at lunch, and 9 units after dinner.    Historical Provider, MD  levothyroxine (SYNTHROID, LEVOTHROID) 75 MCG tablet Take 75 mcg by mouth daily before breakfast.    Historical Provider, MD  losartan (COZAAR) 25 MG tablet Take 25 mg by mouth at bedtime.    Historical Provider, MD  metoprolol (LOPRESSOR) 50 MG tablet Take 50 mg by mouth 2 (two) times daily.     Historical Provider, MD  RABEprazole (ACIPHEX) 20 MG tablet Take 20 mg by mouth at bedtime.  05/03/15   Historical Provider, MD  ranitidine (ZANTAC) 300 MG tablet Take 300 mg by mouth at bedtime.    Historical Provider, MD  simvastatin (ZOCOR) 40 MG tablet Take 40 mg by mouth every evening.     Historical Provider, MD     No family history on file.  History   Social History  . Marital Status: Married    Spouse Name: N/A  . Number of Children: N/A  . Years of Education: N/A   Social History Main Topics  . Smoking status: Never Smoker   . Smokeless tobacco: Never Used  . Alcohol Use: No  . Drug Use: No  . Sexual Activity: Not Currently   Other Topics Concern  . Not on file   Social History Narrative      Review of Systems  Constitutional: Negative for fever and chills.  Respiratory: Negative for shortness of breath.        Occasional dry cough  Cardiovascular: Negative for chest pain.  Gastrointestinal: Negative for nausea, vomiting, abdominal pain and blood in stool.  Genitourinary: Negative for dysuria and hematuria.  Musculoskeletal: Negative for back pain.  Neurological: Negative for headaches.    Vital Signs: Blood pressure 150/82, temperature 97.4, heart rate 64, respirations 16, oxygen saturation is 100% on room air  Physical Exam  Constitutional: He is oriented to person, place, and time. He appears well-developed and well-nourished.  Cardiovascular: Normal rate and regular rhythm.   Pulmonary/Chest: Effort normal and breath sounds normal.  Abdominal: Soft. Bowel sounds are normal. There is no tenderness.  Musculoskeletal: Normal range of motion. He exhibits no edema.  Neurological: He is alert and oriented to person, place, and time.    Mallampati Score:     Imaging: Dg Chest 2 View  06/18/2015   CLINICAL DATA:  Preop cardiovascular exam.  Coronary artery disease.  EXAM: CHEST  2 VIEW  COMPARISON:  02/28/2015  FINDINGS: The heart size and mediastinal contours are within normal limits. Both lungs are clear. No evidence of pneumothorax or pleural effusion. Prior CABG again noted.  IMPRESSION: Stable exam.  No active cardiopulmonary disease.   Electronically Signed   By: Earle Gell M.D.   On: 06/18/2015 15:25    Labs:  CBC:  Recent Labs   05/10/15 1529 06/18/15 1445  WBC  --  5.8  HGB 13.3 14.0  HCT 39.0 43.2  PLT  --  214    COAGS:  Recent Labs  06/23/15 1200  INR 0.94    BMP:  Recent Labs  05/10/15 1212 05/10/15 1529 06/18/15 1445 06/23/15 1200  NA 138 143 141 135  K 7.1* 4.6 5.5* 5.3*  CL 107 106 111 108  CO2 26  --  24 23  GLUCOSE 116* 62* 86 195*  BUN 30* 28* 40* 35*  CALCIUM 9.3  --  8.9 9.0  CREATININE 2.15* 2.00* 2.04* 1.62*  GFRNONAA 30*  --  32* 42*  GFRAA 35*  --  37* 48*    LIVER FUNCTION TESTS: No results for input(s): BILITOT, AST, ALT, ALKPHOS, PROT, ALBUMIN in the last 8760 hours.  TUMOR MARKERS: No results for input(s): AFPTM, CEA, CA199, CHROMGRNA in the last 8760 hours.  Assessment and Plan: Patient with prior history of prostate cancer 2010 and nephrolithiasis with recently noted large partial staghorn left renal calculus. Patient unable to tolerate medical therapy secondary to hyperkalemia and hypertension and now presents for left percutaneous nephroureteral catheter placement prior to nephrolithotomy. Details/risks of procedure, including not limited to, internal bleeding, infection, renal injury, injury to adjacent organs discussed with patient and wife with their understanding and consent.   Thank you for this interesting consult.  I greatly enjoyed meeting Jesus Young and look forward to participating in their care.  A copy of this report was sent to the requesting provider on this date.  Signed: D. Rowe Robert 06/23/2015, 12:36 PM   I spent a total of 30 minutes in face to face in clinical consultation, greater than 50% of which was counseling/coordinating care for left percutaneous nephroureteral catheter placement

## 2015-06-23 NOTE — Anesthesia Procedure Notes (Signed)
Procedure Name: Intubation Performed by: Keyia Moretto J Pre-anesthesia Checklist: Patient identified, Emergency Drugs available, Suction available, Patient being monitored and Timeout performed Patient Re-evaluated:Patient Re-evaluated prior to inductionOxygen Delivery Method: Circle system utilized Preoxygenation: Pre-oxygenation with 100% oxygen Intubation Type: IV induction Ventilation: Mask ventilation without difficulty Laryngoscope Size: Mac and 4 Grade View: Grade I Tube type: Oral Tube size: 7.5 mm Number of attempts: 1 Airway Equipment and Method: Stylet Placement Confirmation: ETT inserted through vocal cords under direct vision,  positive ETCO2,  CO2 detector and breath sounds checked- equal and bilateral Secured at: 23 cm Tube secured with: Tape Dental Injury: Teeth and Oropharynx as per pre-operative assessment        

## 2015-06-23 NOTE — Transfer of Care (Signed)
Immediate Anesthesia Transfer of Care Note  Patient: Jesus Young  Procedure(s) Performed: Procedure(s): NEPHROLITHOTOMY PERCUTANEOUS (Left)  Patient Location: PACU  Anesthesia Type:General  Level of Consciousness: awake, alert  and oriented  Airway & Oxygen Therapy: Patient Spontanous Breathing and Patient connected to face mask oxygen  Post-op Assessment: Report given to RN and Post -op Vital signs reviewed and stable  Post vital signs: Reviewed and stable  Last Vitals:  Filed Vitals:   06/23/15 1657  BP: 141/71  Pulse:   Temp:   Resp:     Complications: No apparent anesthesia complications

## 2015-06-23 NOTE — Discharge Instructions (Signed)
1.  You will most likely be discharged without any tubes or drains.  You will have a dressing over your drainage site that will drain some blood-tinged urine for 48-72 hours.  You will be taught how to place a new dressing over this area as needed to avoid getting your clothes wet.  You should change this dressing as needed when it becomes too wet or saturated.  Once the drainage has stopped, you can leave the dressing off.  2.  You should call if you develop fever > 101, persistent nausea or vomiting, or experience pain not controlled with your pain medication.  3.  You may resume your Plavix in one week.  In the meantime, you should continue aspirin 81 mg.

## 2015-06-24 ENCOUNTER — Encounter (HOSPITAL_COMMUNITY): Payer: Self-pay | Admitting: Urology

## 2015-06-24 DIAGNOSIS — Z9079 Acquired absence of other genital organ(s): Secondary | ICD-10-CM | POA: Diagnosis not present

## 2015-06-24 DIAGNOSIS — E119 Type 2 diabetes mellitus without complications: Secondary | ICD-10-CM | POA: Diagnosis not present

## 2015-06-24 DIAGNOSIS — M549 Dorsalgia, unspecified: Secondary | ICD-10-CM | POA: Diagnosis not present

## 2015-06-24 DIAGNOSIS — Z87440 Personal history of urinary (tract) infections: Secondary | ICD-10-CM | POA: Diagnosis not present

## 2015-06-24 DIAGNOSIS — I10 Essential (primary) hypertension: Secondary | ICD-10-CM | POA: Diagnosis not present

## 2015-06-24 DIAGNOSIS — Z7902 Long term (current) use of antithrombotics/antiplatelets: Secondary | ICD-10-CM | POA: Diagnosis not present

## 2015-06-24 DIAGNOSIS — Z951 Presence of aortocoronary bypass graft: Secondary | ICD-10-CM | POA: Diagnosis not present

## 2015-06-24 DIAGNOSIS — E875 Hyperkalemia: Secondary | ICD-10-CM | POA: Diagnosis not present

## 2015-06-24 DIAGNOSIS — K219 Gastro-esophageal reflux disease without esophagitis: Secondary | ICD-10-CM | POA: Diagnosis not present

## 2015-06-24 DIAGNOSIS — N200 Calculus of kidney: Secondary | ICD-10-CM | POA: Diagnosis not present

## 2015-06-24 DIAGNOSIS — Z87442 Personal history of urinary calculi: Secondary | ICD-10-CM | POA: Diagnosis not present

## 2015-06-24 DIAGNOSIS — E785 Hyperlipidemia, unspecified: Secondary | ICD-10-CM | POA: Diagnosis not present

## 2015-06-24 DIAGNOSIS — Z5181 Encounter for therapeutic drug level monitoring: Secondary | ICD-10-CM | POA: Diagnosis not present

## 2015-06-24 DIAGNOSIS — Z887 Allergy status to serum and vaccine status: Secondary | ICD-10-CM | POA: Diagnosis not present

## 2015-06-24 DIAGNOSIS — Z79899 Other long term (current) drug therapy: Secondary | ICD-10-CM | POA: Diagnosis not present

## 2015-06-24 DIAGNOSIS — I2510 Atherosclerotic heart disease of native coronary artery without angina pectoris: Secondary | ICD-10-CM | POA: Diagnosis not present

## 2015-06-24 DIAGNOSIS — G8929 Other chronic pain: Secondary | ICD-10-CM | POA: Diagnosis not present

## 2015-06-24 DIAGNOSIS — Z936 Other artificial openings of urinary tract status: Secondary | ICD-10-CM | POA: Diagnosis not present

## 2015-06-24 DIAGNOSIS — E039 Hypothyroidism, unspecified: Secondary | ICD-10-CM | POA: Diagnosis not present

## 2015-06-24 DIAGNOSIS — I252 Old myocardial infarction: Secondary | ICD-10-CM | POA: Diagnosis not present

## 2015-06-24 DIAGNOSIS — Z8546 Personal history of malignant neoplasm of prostate: Secondary | ICD-10-CM | POA: Diagnosis not present

## 2015-06-24 DIAGNOSIS — M1990 Unspecified osteoarthritis, unspecified site: Secondary | ICD-10-CM | POA: Diagnosis not present

## 2015-06-24 DIAGNOSIS — Z794 Long term (current) use of insulin: Secondary | ICD-10-CM | POA: Diagnosis not present

## 2015-06-24 LAB — GLUCOSE, CAPILLARY
GLUCOSE-CAPILLARY: 104 mg/dL — AB (ref 65–99)
Glucose-Capillary: 155 mg/dL — ABNORMAL HIGH (ref 65–99)
Glucose-Capillary: 156 mg/dL — ABNORMAL HIGH (ref 65–99)
Glucose-Capillary: 185 mg/dL — ABNORMAL HIGH (ref 65–99)

## 2015-06-24 LAB — BASIC METABOLIC PANEL
ANION GAP: 5 (ref 5–15)
BUN: 26 mg/dL — ABNORMAL HIGH (ref 6–20)
CALCIUM: 8.3 mg/dL — AB (ref 8.9–10.3)
CHLORIDE: 107 mmol/L (ref 101–111)
CO2: 23 mmol/L (ref 22–32)
CREATININE: 1.7 mg/dL — AB (ref 0.61–1.24)
GFR, EST AFRICAN AMERICAN: 46 mL/min — AB (ref >60)
GFR, EST NON AFRICAN AMERICAN: 39 mL/min — AB (ref >60)
Glucose, Bld: 155 mg/dL — ABNORMAL HIGH (ref 65–99)
Potassium: 4.7 mmol/L (ref 3.5–5.1)
Sodium: 135 mmol/L (ref 135–145)

## 2015-06-24 LAB — HEMOGLOBIN AND HEMATOCRIT, BLOOD
HCT: 40.1 % (ref 39.0–52.0)
HEMOGLOBIN: 12.8 g/dL — AB (ref 13.0–17.0)

## 2015-06-24 NOTE — Progress Notes (Signed)
[name], male   DOB: 07/19/1946, 69 y.o.   MRN: [id number]  1 Day Post-Op Subjective: Pt doing well.  No complaints except for expected pain at nephrostomy tube site.  No CP, SOB, or other complaints.  Objective: Vital signs in last 24 hours: Temp:  [97.4 F (36.3 C)-97.9 F (36.6 C)] 97.9 F (36.6 C) (08/09 0434) Pulse Rate:  [59-76] 72 (08/09 0434) Resp:  [11-18] 16 (08/09 0434) BP: (122-175)/(60-95) 130/65 mmHg (08/09 0434) SpO2:  [94 %-100 %] 95 % (08/09 0434) Weight:  [80.74 kg (178 lb)] 80.74 kg (178 lb) (08/08 1140)  Intake/Output from previous day: 08/08 0701 - 08/09 0700 In: [id number] [P.O.:480; I.V.:1250] Out: 1480 [Urine:1480] Intake/Output this shift:    Physical Exam:  General: Alert and oriented CV: RRR GU: Urine pink draining via nephrostomy and urethral catheter Ext: NT, No erythema  Lab Results:  Recent Labs  06/23/15 1200 06/23/15 1928 06/24/15 0458  HGB 14.1 13.2 12.8*  HCT 44.3 41.8 40.1   BMET  Recent Labs  06/23/15 1928 06/24/15 0458  NA 135 135  K 4.9 4.7  CL 108 107  CO2 23 23  GLUCOSE 208* 155*  BUN 29* 26*  CREATININE 1.56* 1.70*  CALCIUM 8.2* 8.3*     Studies/Results:  Assessment/Plan: - Nephrostomy tube was removed this morning.  He was monitored for 15 minutes without evidence of bleeding and his safety catheter was then removed. - D/C Foley and IV - D/C home later this morning     Harlan Ervine,LES 06/24/2015, 8:06 AM

## 2015-06-24 NOTE — Discharge Summary (Signed)
Date of admission: 06/23/2015  Date of discharge: 06/24/2015  Admission diagnosis: Left renal calculi  Discharge diagnosis: Left renal calculi  Secondary diagnoses: Diabetes, coronary artery disease  History and Physical: For full details, please see admission history and physical. Briefly, Jesus Young is a 69 y.o. year old patient with large burden left renal calculi.  He was felt to likely have uric acid stones but could not tolerate medical therapy.  After further discussion, he elected surgical treatment with left PCNL.   Hospital Course: He was taken to IR on 06/23/15 and underwent left percutaneous renal access placement.  He was then brought the the OR for PCNL and was able to be rendered stone free with a combination of rigid and flexible nephroscopic techniques.  He recovered uneventfully and remained hemodynamically stable overnight.  He was maintained on aspirin 81 mg perioperatively due to his history of a cardiac stent.  His urine was minimally red in his nephrostomy tube on POD # 1 and it was able to be removed.  He was discharged home.  Laboratory values:  Recent Labs  06/23/15 1200 06/23/15 1928 06/24/15 0458  HGB 14.1 13.2 12.8*  HCT 44.3 41.8 40.1    Recent Labs  06/23/15 1928 06/24/15 0458  CREATININE 1.56* 1.70*    Disposition: Home  Discharge instruction: The patient was instructed to be ambulatory but told to refrain from heavy lifting, strenuous activity, or driving.  He will resume Plavix in one week and will continue his ASA 81 mg.  Discharge medications:    Medication List    STOP taking these medications        clopidogrel 75 MG tablet  Commonly known as:  PLAVIX      TAKE these medications        aspirin EC 81 MG tablet  Take 81 mg by mouth at bedtime.     celecoxib 200 MG capsule  Commonly known as:  CELEBREX  Take 200 mg by mouth at bedtime.     docusate sodium 100 MG capsule  Commonly known as:  COLACE  Take 1 capsule (100 mg  total) by mouth 2 (two) times daily.     docusate sodium 100 MG capsule  Commonly known as:  COLACE  Take 100-300 mg by mouth at bedtime as needed for mild constipation (constipation).     HYDROcodone-acetaminophen 5-325 MG per tablet  Commonly known as:  NORCO/VICODIN  Take 1-2 tablets by mouth every 6 (six) hours as needed.     insulin detemir 100 UNIT/ML injection  Commonly known as:  LEVEMIR  Inject 14-16 Units into the skin 2 (two) times daily. 16 units in the am, and 14 units in the pm.     insulin lispro 100 UNIT/ML injection  Commonly known as:  HUMALOG  Inject 5-9 Units into the skin 2 (two) times daily. 5 units at lunch, and 9 units after dinner.     levothyroxine 75 MCG tablet  Commonly known as:  SYNTHROID, LEVOTHROID  Take 75 mcg by mouth daily before breakfast.     losartan 25 MG tablet  Commonly known as:  COZAAR  Take 25 mg by mouth at bedtime.     metoprolol 50 MG tablet  Commonly known as:  LOPRESSOR  Take 50 mg by mouth 2 (two) times daily.     RABEprazole 20 MG tablet  Commonly known as:  ACIPHEX  Take 20 mg by mouth at bedtime.     ranitidine 300 MG tablet  Commonly known as:  ZANTAC  Take 300 mg by mouth at bedtime.     simvastatin 40 MG tablet  Commonly known as:  ZOCOR  Take 40 mg by mouth every evening.        Followup:      Follow-up Information    Follow up with Dutch Gray, MD.   Specialty:  Urology   Why:  07/22/15 at 10:45 AM   Contact information:   [place] [id number] [phone]

## 2015-07-07 DIAGNOSIS — Z794 Long term (current) use of insulin: Secondary | ICD-10-CM | POA: Diagnosis not present

## 2015-07-07 DIAGNOSIS — I2510 Atherosclerotic heart disease of native coronary artery without angina pectoris: Secondary | ICD-10-CM | POA: Diagnosis not present

## 2015-07-07 DIAGNOSIS — E1122 Type 2 diabetes mellitus with diabetic chronic kidney disease: Secondary | ICD-10-CM | POA: Diagnosis not present

## 2015-07-07 DIAGNOSIS — N183 Chronic kidney disease, stage 3 (moderate): Secondary | ICD-10-CM | POA: Diagnosis not present

## 2015-07-22 ENCOUNTER — Encounter (HOSPITAL_COMMUNITY): Payer: Self-pay | Admitting: Emergency Medicine

## 2015-07-22 ENCOUNTER — Emergency Department (HOSPITAL_COMMUNITY)
Admission: EM | Admit: 2015-07-22 | Discharge: 2015-07-23 | Disposition: A | Discharge status: Home / Self Care | Payer: Medicare Other | Attending: Emergency Medicine | Admitting: Emergency Medicine

## 2015-07-22 ENCOUNTER — Telehealth: Payer: Self-pay | Admitting: Urology

## 2015-07-22 DIAGNOSIS — E039 Hypothyroidism, unspecified: Secondary | ICD-10-CM | POA: Diagnosis not present

## 2015-07-22 DIAGNOSIS — Z8619 Personal history of other infectious and parasitic diseases: Secondary | ICD-10-CM | POA: Diagnosis not present

## 2015-07-22 DIAGNOSIS — I252 Old myocardial infarction: Secondary | ICD-10-CM | POA: Diagnosis not present

## 2015-07-22 DIAGNOSIS — E875 Hyperkalemia: Secondary | ICD-10-CM

## 2015-07-22 DIAGNOSIS — N289 Disorder of kidney and ureter, unspecified: Secondary | ICD-10-CM | POA: Insufficient documentation

## 2015-07-22 DIAGNOSIS — Z794 Long term (current) use of insulin: Secondary | ICD-10-CM | POA: Diagnosis not present

## 2015-07-22 DIAGNOSIS — Z7902 Long term (current) use of antithrombotics/antiplatelets: Secondary | ICD-10-CM | POA: Insufficient documentation

## 2015-07-22 DIAGNOSIS — K219 Gastro-esophageal reflux disease without esophagitis: Secondary | ICD-10-CM | POA: Diagnosis not present

## 2015-07-22 DIAGNOSIS — I10 Essential (primary) hypertension: Secondary | ICD-10-CM | POA: Insufficient documentation

## 2015-07-22 DIAGNOSIS — Z8546 Personal history of malignant neoplasm of prostate: Secondary | ICD-10-CM | POA: Insufficient documentation

## 2015-07-22 DIAGNOSIS — Z79899 Other long term (current) drug therapy: Secondary | ICD-10-CM | POA: Insufficient documentation

## 2015-07-22 DIAGNOSIS — Z9861 Coronary angioplasty status: Secondary | ICD-10-CM | POA: Diagnosis not present

## 2015-07-22 DIAGNOSIS — M158 Other polyosteoarthritis: Secondary | ICD-10-CM | POA: Diagnosis not present

## 2015-07-22 DIAGNOSIS — E119 Type 2 diabetes mellitus without complications: Secondary | ICD-10-CM | POA: Insufficient documentation

## 2015-07-22 DIAGNOSIS — G8929 Other chronic pain: Secondary | ICD-10-CM | POA: Diagnosis not present

## 2015-07-22 DIAGNOSIS — Z9889 Other specified postprocedural states: Secondary | ICD-10-CM | POA: Diagnosis not present

## 2015-07-22 DIAGNOSIS — Z7982 Long term (current) use of aspirin: Secondary | ICD-10-CM | POA: Diagnosis not present

## 2015-07-22 DIAGNOSIS — E785 Hyperlipidemia, unspecified: Secondary | ICD-10-CM | POA: Diagnosis not present

## 2015-07-22 DIAGNOSIS — Z87828 Personal history of other (healed) physical injury and trauma: Secondary | ICD-10-CM | POA: Insufficient documentation

## 2015-07-22 DIAGNOSIS — I2510 Atherosclerotic heart disease of native coronary artery without angina pectoris: Secondary | ICD-10-CM | POA: Diagnosis not present

## 2015-07-22 DIAGNOSIS — R7989 Other specified abnormal findings of blood chemistry: Secondary | ICD-10-CM | POA: Diagnosis present

## 2015-07-22 DIAGNOSIS — N209 Urinary calculus, unspecified: Secondary | ICD-10-CM | POA: Diagnosis not present

## 2015-07-22 LAB — CBC
HCT: 42.8 % (ref 39.0–52.0)
Hemoglobin: 13.4 g/dL (ref 13.0–17.0)
MCH: 29 pg (ref 26.0–34.0)
MCHC: 31.3 g/dL (ref 30.0–36.0)
MCV: 92.6 fL (ref 78.0–100.0)
PLATELETS: 237 10*3/uL (ref 150–400)
RBC: 4.62 MIL/uL (ref 4.22–5.81)
RDW: 13.5 % (ref 11.5–15.5)
WBC: 6.7 10*3/uL (ref 4.0–10.5)

## 2015-07-22 LAB — I-STAT CHEM 8, ED
BUN: 43 mg/dL — ABNORMAL HIGH (ref 6–20)
Calcium, Ion: 1.21 mmol/L (ref 1.13–1.30)
Chloride: 105 mmol/L (ref 101–111)
Creatinine, Ser: 2 mg/dL — ABNORMAL HIGH (ref 0.61–1.24)
Glucose, Bld: 132 mg/dL — ABNORMAL HIGH (ref 65–99)
HEMATOCRIT: 43 % (ref 39.0–52.0)
HEMOGLOBIN: 14.6 g/dL (ref 13.0–17.0)
Potassium: 5.2 mmol/L — ABNORMAL HIGH (ref 3.5–5.1)
Sodium: 139 mmol/L (ref 135–145)
TCO2: 23 mmol/L (ref 0–100)

## 2015-07-22 MED ORDER — SODIUM CHLORIDE 0.9 % IV SOLN
1.0000 g | Freq: Once | INTRAVENOUS | Status: AC
  Administered 2015-07-22: 1 g via INTRAVENOUS
  Filled 2015-07-22: qty 10

## 2015-07-22 NOTE — Telephone Encounter (Signed)
Patient seen to today in follow-up from his PCNL at [facility].  He recovered well from his procedure.  Repeat BMP performed in clinic today to evaluate when/if appropriate to resume his Potassium Citrate for uric acid stone prevention.    Lab called at 10:15pm with critical potassium level of 6.9.  Patient was called immediately and urged to proceed to ED for repeat BMP.

## 2015-07-22 NOTE — ED Provider Notes (Signed)
CSN: [id number]     Arrival date & time 07/22/15  2250 History   First MD Initiated Contact with Patient 07/22/15 2349     Chief Complaint  Patient presents with  . Hyperkalemia      (Consider location/radiation/quality/duration/timing/severity/associated sxs/prior Treatment) Patient is a 69 y.o. male presenting with musculoskeletal pain. The history is provided by the patient. No language interpreter was used.  Muscle Pain This is a new problem. The current episode started today. The problem occurs constantly. The problem has been unchanged. Pertinent negatives include no abdominal pain, fever or urinary symptoms. Nothing aggravates the symptoms. He has tried nothing for the symptoms. The treatment provided no relief.  Pt seen at PCP, Pt sent here for elevated potassium.  Pt reports he has a history of same. Pt had urologic procedure to remove kidney stones in August.     Past Medical History  Diagnosis Date  . Diabetes mellitus without complication     takes Levemir and Humalog daily  . GERD (gastroesophageal reflux disease)     takes Protonix daily and Zantac  . Hypertension     takes Metoprolol daily  . Hypothyroidism     takes Synthroid daily  . Hyperlipidemia     takes Zocor every evening  . Night muscle spasms     takes Robaxin 4 x day   . Constipation     takes Colace at bedtime  . Cancer     prostate  . Myocardial infarction     many yrs before 2002  . Arthritis     back and knees  . Chronic back pain   . Coronary artery disease     takes Plavix daily but is on hold for surgery  . Hemorrhoids   . History of kidney stones   . Cataract     immature on right  . Bronchitis     hx of 2015  . Dizziness   . H/O urinary frequency   . History of bladder infections   . Nocturia   . Fall     hx of with injury to left shoulder    Past Surgical History  Procedure Laterality Date  . Tonsillectomy    . Coronary artery bypass graft  2002    x 5  . Prostatectomy   10/2009  . Cardiac catheterization  05/17/02  . Coronary angioplasty      x 1   . Colonoscopy    . Skin spots removed from back -precancerous    . Lumbar laminectomy/decompression microdiscectomy Bilateral 07/18/2013    Procedure: Bilateral Lumbar four-five Lumbar Laminotomy/Microdiskectomy;  Surgeon: Hosie Spangle, MD;  Location: [place] [facility];  Service: Neurosurgery;  Laterality: Bilateral;  Bilateral Lumbar four-five Lumbar Laminotomy/Microdiskectomy  . Eyelid surgery     . Nephrolithotomy Left 06/23/2015    Procedure: NEPHROLITHOTOMY PERCUTANEOUS;  Surgeon: [name], MD;  Location: [facility];  Service: Urology;  Laterality: Left;   History reviewed. No pertinent family history. Social History  Substance Use Topics  . Smoking status: Never Smoker   . Smokeless tobacco: Never Used  . Alcohol Use: No    Review of Systems  Constitutional: Negative for fever.  Gastrointestinal: Negative for abdominal pain.  All other systems reviewed and are negative.     Allergies  Review of patient's allergies indicates no known allergies.  Home Medications   Prior to Admission medications   Medication Sig Start Date End Date Taking? Authorizing Provider  aspirin EC 81 MG tablet Take  81 mg by mouth at bedtime.    Yes Historical Provider, MD  celecoxib (CELEBREX) 200 MG capsule Take 200 mg by mouth at bedtime.    Yes Historical Provider, MD  clopidogrel (PLAVIX) 75 MG tablet Take 75 mg by mouth at bedtime. 05/26/15  Yes Historical Provider, MD  diclofenac (CATAFLAM) 50 MG tablet Take 50 mg by mouth 2 (two) times daily. 05/26/15  Yes Historical Provider, MD  docusate sodium (COLACE) 100 MG capsule Take 100-300 mg by mouth at bedtime as needed for mild constipation (constipation).    Yes Historical Provider, MD  insulin detemir (LEVEMIR) 100 UNIT/ML injection Inject 14-16 Units into the skin 2 (two) times daily. 16 units in the am, and 14 units in the pm.   Yes Historical Provider, MD  insulin  lispro (HUMALOG) 100 UNIT/ML injection Inject 5-9 Units into the skin 2 (two) times daily. 5 units at lunch, and 9 units after dinner.   Yes Historical Provider, MD  levothyroxine (SYNTHROID, LEVOTHROID) 88 MCG tablet Take 88 mcg by mouth daily. 07/06/15  Yes Historical Provider, MD  losartan (COZAAR) 25 MG tablet Take 25 mg by mouth at bedtime.   Yes Historical Provider, MD  metoprolol (LOPRESSOR) 50 MG tablet Take 50 mg by mouth 2 (two) times daily.    Yes Historical Provider, MD  Probiotic Product (PROBIOTIC PO) Take 1 capsule by mouth daily.   Yes Historical Provider, MD  RABEprazole (ACIPHEX) 20 MG tablet Take 20 mg by mouth at bedtime.  05/03/15  Yes Historical Provider, MD  ranitidine (ZANTAC) 300 MG tablet Take 300 mg by mouth at bedtime.   Yes Historical Provider, MD  simvastatin (ZOCOR) 40 MG tablet Take 40 mg by mouth every evening.   Yes Historical Provider, MD  docusate sodium (COLACE) 100 MG capsule Take 1 capsule (100 mg total) by mouth 2 (two) times daily. Patient not taking: Reported on 07/22/2015 06/23/15   [name], MD  HYDROcodone-acetaminophen (NORCO/VICODIN) 5-325 MG per tablet Take 1-2 tablets by mouth every 6 (six) hours as needed. Patient not taking: Reported on 07/22/2015 06/23/15   [name], MD   BP 152/82 mmHg  Pulse 74  Temp(Src)   Resp 20  SpO2 99% Physical Exam  Constitutional: He is oriented to person, place, and time. He appears well-developed and well-nourished.  HENT:  Head: Normocephalic.  Right Ear: External ear normal.  Left Ear: External ear normal.  Nose: Nose normal.  Mouth/Throat: Oropharynx is clear and moist.  Eyes: Conjunctivae and EOM are normal. Pupils are equal, round, and reactive to light.  Neck: Normal range of motion.  Cardiovascular: Normal rate and normal heart sounds.   Pulmonary/Chest: Effort normal and breath sounds normal.  Abdominal: Soft. He exhibits no distension.  Musculoskeletal: Normal range of motion.  Neurological: He  is alert and oriented to person, place, and time.  Skin: Skin is warm.  Psychiatric: He has a normal mood and affect.  Nursing note and vitals reviewed.   ED Course  Procedures (including critical care time) Labs Review Labs Reviewed  I-STAT CHEM 8, ED - Abnormal; Notable for the following:    Potassium 5.2 (*)    BUN 43 (*)    Creatinine, Ser 2.00 (*)    Glucose, Bld 132 (*)    All other components within normal limits  CBC  COMPREHENSIVE METABOLIC PANEL    Imaging Review No results found. I have personally reviewed and evaluated these images and lab results as part of my medical decision-making.  EKG Interpretation   Date/Time:  Tuesday July 22 2015 23:11:14 EDT Ventricular Rate:  70 PR Interval:  177 QRS Duration: 139 QT Interval:  414 QTC Calculation: 447 R Axis:   17 Text Interpretation:  Sinus rhythm IVCD, consider atypical LBBB with QRS  widening Otherwise no significant change Confirmed by FLOYD MD, DANIEL  [id number]) on 07/22/2015 11:14:54 PM      MDM  Pt has a history of elevated BUn and creatine.  Creatine elevated since 2010.  I discussed with Dr. Lita Mains.  EKG and potassium to be repeated.    Pt's care turned over to Charlann Lange at 1:00am   Final diagnoses:  Hyperkalemia  Renal insufficiency        Fransico Meadow, PA-C 07/23/15 [place], PA-C 07/23/15 [id number]  Julianne Rice, MD 07/23/15 [phone]

## 2015-07-22 NOTE — ED Notes (Addendum)
Pt received a call from NP at PCP office- reported potassium level of 6.9. Pt denies any symptoms besides quadriceps tightening when walking today. Was here recently for similar happenings. K 8.0 last time here-had to be given medications. Was on potassium pills at that time for a resolving kidney stones. No longer taking potassium but has potassium in some medications taking (Diclofenac). No other c/c.

## 2015-07-23 LAB — COMPREHENSIVE METABOLIC PANEL
ALBUMIN: 4.2 g/dL (ref 3.5–5.0)
ALK PHOS: 45 U/L (ref 38–126)
ALT: 14 U/L — ABNORMAL LOW (ref 17–63)
ANION GAP: 7 (ref 5–15)
AST: 22 U/L (ref 15–41)
BUN: 43 mg/dL — ABNORMAL HIGH (ref 6–20)
CALCIUM: 9 mg/dL (ref 8.9–10.3)
CO2: 24 mmol/L (ref 22–32)
Chloride: 107 mmol/L (ref 101–111)
Creatinine, Ser: 2.11 mg/dL — ABNORMAL HIGH (ref 0.61–1.24)
GFR calc Af Amer: 35 mL/min — ABNORMAL LOW (ref >60)
GFR calc non Af Amer: 30 mL/min — ABNORMAL LOW (ref >60)
GLUCOSE: 135 mg/dL — AB (ref 65–99)
POTASSIUM: 5.2 mmol/L — AB (ref 3.5–5.1)
Sodium: 138 mmol/L (ref 135–145)
TOTAL PROTEIN: 6.9 g/dL (ref 6.5–8.1)
Total Bilirubin: 0.6 mg/dL (ref 0.3–1.2)

## 2015-07-23 LAB — URINALYSIS, ROUTINE W REFLEX MICROSCOPIC
Bilirubin Urine: NEGATIVE
Glucose, UA: 250 mg/dL — AB
KETONES UR: NEGATIVE mg/dL
LEUKOCYTES UA: NEGATIVE
NITRITE: NEGATIVE
PH: 5.5 (ref 5.0–8.0)
Protein, ur: NEGATIVE mg/dL
Specific Gravity, Urine: 1.018 (ref 1.005–1.030)
UROBILINOGEN UA: 0.2 mg/dL (ref 0.0–1.0)

## 2015-07-23 LAB — POTASSIUM: Potassium: 4.9 mmol/L (ref 3.5–5.1)

## 2015-07-23 LAB — URINE MICROSCOPIC-ADD ON: URINE-OTHER: NONE SEEN

## 2015-07-23 NOTE — ED Provider Notes (Signed)
Hyperkalemia - 9 ID DM, renal insufficiency UA pending Ca gluconate given EKG without peaked t-waves Pending repeat potassium (first 5.2)  Recheck of potassium 4.9. The patient is comfortable and will be discharged to follow up with PCP as scheduled.   Charlann Lange, PA-C 07/23/15 [place], DO 07/23/15 [phone]

## 2015-07-23 NOTE — Discharge Instructions (Signed)
Hyperkalemia Hyperkalemia means you have too much potassium in your blood. Potassium is a type of salt in the blood (electrolyte). Normally, your kidneys remove potassium from the body. Too much potassium can be life-threatening. HOME CARE  Only take medicine as told by your doctor.  Do not take vitamins or natural products unless your doctor says they are okay.  Keep all doctor visits as told.  Follow diet instructions as told by your doctor. GET HELP RIGHT AWAY IF:  Your heartbeat is not regular or very slow.  You feel dizzy (lightheaded).  You feel weak.  You are short of breath.  You have chest pain.  You pass out (faint). MAKE SURE YOU:   Understand these instructions.  Will watch your condition.  Will get help right away if you are not doing well or get worse. Document Released: 11/01/2005 Document Revised: 01/24/2012 Document Reviewed: 02/06/2014 [facility] Patient Information 2015 [place], [place]. This information is not intended to replace advice given to you by your health care provider. Make sure you discuss any questions you have with your health care provider.

## 2015-07-23 NOTE — ED Notes (Signed)
Called lab before sending add on slip for K value.   When blood was drawn, two lt greens were sent down.  After waiting for results, RN sts that lab sts that they do not have add on slip or Lt green with "save tube" label on it. RN has now drawn blood from pt IV and sent to lab for results.

## 2015-07-25 DIAGNOSIS — E1165 Type 2 diabetes mellitus with hyperglycemia: Secondary | ICD-10-CM | POA: Diagnosis not present

## 2015-07-25 DIAGNOSIS — I10 Essential (primary) hypertension: Secondary | ICD-10-CM | POA: Diagnosis not present

## 2015-07-25 DIAGNOSIS — E780 Pure hypercholesterolemia: Secondary | ICD-10-CM | POA: Diagnosis not present

## 2015-08-01 DIAGNOSIS — Z23 Encounter for immunization: Secondary | ICD-10-CM | POA: Diagnosis not present

## 2015-08-01 DIAGNOSIS — C61 Malignant neoplasm of prostate: Secondary | ICD-10-CM | POA: Diagnosis not present

## 2015-08-01 DIAGNOSIS — M5136 Other intervertebral disc degeneration, lumbar region: Secondary | ICD-10-CM | POA: Diagnosis not present

## 2015-08-01 DIAGNOSIS — I10 Essential (primary) hypertension: Secondary | ICD-10-CM | POA: Diagnosis not present

## 2015-08-01 DIAGNOSIS — N183 Chronic kidney disease, stage 3 (moderate): Secondary | ICD-10-CM | POA: Diagnosis not present

## 2015-08-01 DIAGNOSIS — E1165 Type 2 diabetes mellitus with hyperglycemia: Secondary | ICD-10-CM | POA: Diagnosis not present

## 2015-08-13 DIAGNOSIS — E780 Pure hypercholesterolemia: Secondary | ICD-10-CM | POA: Diagnosis not present

## 2015-08-13 DIAGNOSIS — E1165 Type 2 diabetes mellitus with hyperglycemia: Secondary | ICD-10-CM | POA: Diagnosis not present

## 2015-08-13 DIAGNOSIS — E039 Hypothyroidism, unspecified: Secondary | ICD-10-CM | POA: Diagnosis not present

## 2015-08-13 DIAGNOSIS — I10 Essential (primary) hypertension: Secondary | ICD-10-CM | POA: Diagnosis not present

## 2015-08-13 DIAGNOSIS — N183 Chronic kidney disease, stage 3 (moderate): Secondary | ICD-10-CM | POA: Diagnosis not present

## 2015-08-20 DIAGNOSIS — E039 Hypothyroidism, unspecified: Secondary | ICD-10-CM | POA: Diagnosis not present

## 2015-08-20 DIAGNOSIS — E7800 Pure hypercholesterolemia, unspecified: Secondary | ICD-10-CM | POA: Diagnosis not present

## 2015-08-20 DIAGNOSIS — E1165 Type 2 diabetes mellitus with hyperglycemia: Secondary | ICD-10-CM | POA: Diagnosis not present

## 2015-08-20 DIAGNOSIS — I10 Essential (primary) hypertension: Secondary | ICD-10-CM | POA: Diagnosis not present

## 2015-09-17 DIAGNOSIS — M179 Osteoarthritis of knee, unspecified: Secondary | ICD-10-CM | POA: Diagnosis not present

## 2015-09-17 DIAGNOSIS — M25561 Pain in right knee: Secondary | ICD-10-CM | POA: Diagnosis not present

## 2015-09-17 DIAGNOSIS — M7051 Other bursitis of knee, right knee: Secondary | ICD-10-CM | POA: Diagnosis not present

## 2015-09-17 DIAGNOSIS — N183 Chronic kidney disease, stage 3 (moderate): Secondary | ICD-10-CM | POA: Diagnosis not present

## 2015-09-17 DIAGNOSIS — I10 Essential (primary) hypertension: Secondary | ICD-10-CM | POA: Diagnosis not present

## 2015-09-17 DIAGNOSIS — M2550 Pain in unspecified joint: Secondary | ICD-10-CM | POA: Diagnosis not present

## 2015-10-15 DIAGNOSIS — M795 Residual foreign body in soft tissue: Secondary | ICD-10-CM | POA: Diagnosis not present

## 2015-10-15 DIAGNOSIS — I10 Essential (primary) hypertension: Secondary | ICD-10-CM | POA: Diagnosis not present

## 2015-10-15 DIAGNOSIS — E7800 Pure hypercholesterolemia, unspecified: Secondary | ICD-10-CM | POA: Diagnosis not present

## 2015-10-15 DIAGNOSIS — B070 Plantar wart: Secondary | ICD-10-CM | POA: Diagnosis not present

## 2015-10-30 ENCOUNTER — Encounter (HOSPITAL_COMMUNITY): Payer: Self-pay | Admitting: Emergency Medicine

## 2015-10-30 ENCOUNTER — Emergency Department (HOSPITAL_COMMUNITY): Payer: Medicare Other

## 2015-10-30 ENCOUNTER — Emergency Department (HOSPITAL_COMMUNITY)
Admission: EM | Admit: 2015-10-30 | Discharge: 2015-10-30 | Disposition: A | Discharge status: Home / Self Care | Payer: Medicare Other | Attending: Emergency Medicine | Admitting: Emergency Medicine

## 2015-10-30 DIAGNOSIS — E039 Hypothyroidism, unspecified: Secondary | ICD-10-CM | POA: Insufficient documentation

## 2015-10-30 DIAGNOSIS — Z79899 Other long term (current) drug therapy: Secondary | ICD-10-CM | POA: Insufficient documentation

## 2015-10-30 DIAGNOSIS — G8929 Other chronic pain: Secondary | ICD-10-CM | POA: Diagnosis not present

## 2015-10-30 DIAGNOSIS — I252 Old myocardial infarction: Secondary | ICD-10-CM | POA: Diagnosis not present

## 2015-10-30 DIAGNOSIS — Z791 Long term (current) use of non-steroidal anti-inflammatories (NSAID): Secondary | ICD-10-CM | POA: Diagnosis not present

## 2015-10-30 DIAGNOSIS — Z7982 Long term (current) use of aspirin: Secondary | ICD-10-CM | POA: Insufficient documentation

## 2015-10-30 DIAGNOSIS — Z87442 Personal history of urinary calculi: Secondary | ICD-10-CM | POA: Diagnosis not present

## 2015-10-30 DIAGNOSIS — Z87828 Personal history of other (healed) physical injury and trauma: Secondary | ICD-10-CM | POA: Diagnosis not present

## 2015-10-30 DIAGNOSIS — Z794 Long term (current) use of insulin: Secondary | ICD-10-CM | POA: Insufficient documentation

## 2015-10-30 DIAGNOSIS — Z8546 Personal history of malignant neoplasm of prostate: Secondary | ICD-10-CM | POA: Insufficient documentation

## 2015-10-30 DIAGNOSIS — H269 Unspecified cataract: Secondary | ICD-10-CM | POA: Insufficient documentation

## 2015-10-30 DIAGNOSIS — M158 Other polyosteoarthritis: Secondary | ICD-10-CM | POA: Insufficient documentation

## 2015-10-30 DIAGNOSIS — S299XXA Unspecified injury of thorax, initial encounter: Secondary | ICD-10-CM | POA: Diagnosis not present

## 2015-10-30 DIAGNOSIS — E785 Hyperlipidemia, unspecified: Secondary | ICD-10-CM | POA: Insufficient documentation

## 2015-10-30 DIAGNOSIS — Z7902 Long term (current) use of antithrombotics/antiplatelets: Secondary | ICD-10-CM | POA: Diagnosis not present

## 2015-10-30 DIAGNOSIS — E161 Other hypoglycemia: Secondary | ICD-10-CM | POA: Diagnosis not present

## 2015-10-30 DIAGNOSIS — E162 Hypoglycemia, unspecified: Secondary | ICD-10-CM

## 2015-10-30 DIAGNOSIS — K219 Gastro-esophageal reflux disease without esophagitis: Secondary | ICD-10-CM | POA: Diagnosis not present

## 2015-10-30 DIAGNOSIS — I10 Essential (primary) hypertension: Secondary | ICD-10-CM | POA: Insufficient documentation

## 2015-10-30 DIAGNOSIS — E11649 Type 2 diabetes mellitus with hypoglycemia without coma: Secondary | ICD-10-CM | POA: Insufficient documentation

## 2015-10-30 LAB — BASIC METABOLIC PANEL
ANION GAP: 6 (ref 5–15)
BUN: 26 mg/dL — ABNORMAL HIGH (ref 6–20)
CO2: 26 mmol/L (ref 22–32)
Calcium: 9.2 mg/dL (ref 8.9–10.3)
Chloride: 106 mmol/L (ref 101–111)
Creatinine, Ser: 1.68 mg/dL — ABNORMAL HIGH (ref 0.61–1.24)
GFR calc Af Amer: 46 mL/min — ABNORMAL LOW (ref >60)
GFR calc non Af Amer: 40 mL/min — ABNORMAL LOW (ref >60)
Glucose, Bld: 113 mg/dL — ABNORMAL HIGH (ref 65–99)
Potassium: 4.4 mmol/L (ref 3.5–5.1)
Sodium: 138 mmol/L (ref 135–145)

## 2015-10-30 LAB — CBG MONITORING, ED
GLUCOSE-CAPILLARY: 94 mg/dL (ref 65–99)
GLUCOSE-CAPILLARY: 134 mg/dL — AB (ref 65–99)
Glucose-Capillary: 103 mg/dL — ABNORMAL HIGH (ref 65–99)
Glucose-Capillary: 132 mg/dL — ABNORMAL HIGH (ref 65–99)

## 2015-10-30 LAB — CBC WITH DIFFERENTIAL/PLATELET
Basophils Absolute: 0 10*3/uL (ref 0.0–0.1)
Basophils Relative: 1 %
EOS ABS: 0.2 10*3/uL (ref 0.0–0.7)
EOS PCT: 3 %
HCT: 41.5 % (ref 39.0–52.0)
Hemoglobin: 12.8 g/dL — ABNORMAL LOW (ref 13.0–17.0)
LYMPHS ABS: 0.8 10*3/uL (ref 0.7–4.0)
Lymphocytes Relative: 13 %
MCH: 28.3 pg (ref 26.0–34.0)
MCHC: 30.8 g/dL (ref 30.0–36.0)
MCV: 91.8 fL (ref 78.0–100.0)
MONO ABS: 0.7 10*3/uL (ref 0.1–1.0)
Monocytes Relative: 11 %
Neutro Abs: 4.6 10*3/uL (ref 1.7–7.7)
Neutrophils Relative %: 74 %
PLATELETS: 170 10*3/uL (ref 150–400)
RBC: 4.52 MIL/uL (ref 4.22–5.81)
RDW: 15.1 % (ref 11.5–15.5)
WBC: 6.2 10*3/uL (ref 4.0–10.5)

## 2015-10-30 LAB — URINE MICROSCOPIC-ADD ON

## 2015-10-30 LAB — URINALYSIS, ROUTINE W REFLEX MICROSCOPIC
Bilirubin Urine: NEGATIVE
Glucose, UA: 100 mg/dL — AB
Ketones, ur: NEGATIVE mg/dL
LEUKOCYTES UA: NEGATIVE
Nitrite: NEGATIVE
PROTEIN: 30 mg/dL — AB
Specific Gravity, Urine: 1.022 (ref 1.005–1.030)
pH: 5 (ref 5.0–8.0)

## 2015-10-30 MED ORDER — INSULIN LISPRO 100 UNIT/ML ~~LOC~~ SOLN: 5.0000 [IU] | Freq: Two times a day (BID) | SUBCUTANEOUS | Status: AC

## 2015-10-30 MED ORDER — INSULIN DETEMIR 100 UNIT/ML ~~LOC~~ SOLN: 16.0000 [IU] | Freq: Two times a day (BID) | SUBCUTANEOUS | Status: DC

## 2015-10-30 NOTE — ED Notes (Signed)
Pt given Sprite Zero and a pack of gram crackers per provider.

## 2015-10-30 NOTE — ED Notes (Signed)
Called lab to add on culture

## 2015-10-30 NOTE — ED Notes (Signed)
MD at bedside. 

## 2015-10-30 NOTE — Discharge Instructions (Signed)
Hypoglycemia Hypoglycemia occurs when the glucose in your blood is too low. Glucose is a type of sugar that is your body's main energy source. Hormones, such as insulin and glucagon, control the level of glucose in the blood. Insulin lowers blood glucose and glucagon increases blood glucose. Having too much insulin in your blood stream, or not eating enough food containing sugar, can result in hypoglycemia. Hypoglycemia can happen to people with or without diabetes. It can develop quickly and can be a medical emergency.  CAUSES   Missing or delaying meals.  Not eating enough carbohydrates at meals.  Taking too much diabetes medicine.  Not timing your oral diabetes medicine or insulin doses with meals, snacks, and exercise.  Nausea and vomiting.  Certain medicines.  Severe illnesses, such as hepatitis, kidney disorders, and certain eating disorders.  Increased activity or exercise without eating something extra or adjusting medicines.  Drinking too much alcohol.  A nerve disorder that affects body functions like your heart rate, blood pressure, and digestion (autonomic neuropathy).  A condition where the stomach muscles do not function properly (gastroparesis). Therefore, medicines and food may not absorb properly.  Rarely, a tumor of the pancreas can produce too much insulin. SYMPTOMS   Hunger.  Sweating (diaphoresis).  Change in body temperature.  Shakiness.  Headache.  Anxiety.  Lightheadedness.  Irritability.  Difficulty concentrating.  Dry mouth.  Tingling or numbness in the hands or feet.  Restless sleep or sleep disturbances.  Altered speech and coordination.  Change in mental status.  Seizures or prolonged convulsions.  Combativeness.  Drowsiness (lethargic).  Weakness.  Increased heart rate or palpitations.  Confusion.  Pale, gray skin color.  Blurred or double vision.  Fainting. DIAGNOSIS  A physical exam and medical history will be  performed. Your caregiver may make a diagnosis based on your symptoms. Blood tests and other lab tests may be performed to confirm a diagnosis. Once the diagnosis is made, your caregiver will see if your signs and symptoms go away once your blood glucose is raised.  TREATMENT  Usually, you can easily treat your hypoglycemia when you notice symptoms.  Check your blood glucose. If it is less than 70 mg/dl, take one of the following:   3-4 glucose tablets.    cup juice.    cup regular soda.   1 cup skim milk.   -1 tube of glucose gel.   5-6 hard candies.   Avoid high-fat drinks or food that may delay a rise in blood glucose levels.  Do not take more than the recommended amount of sugary foods, drinks, gel, or tablets. Doing so will cause your blood glucose to go too high.   Wait 10-15 minutes and recheck your blood glucose. If it is still less than 70 mg/dl or below your target range, repeat treatment.   Eat a snack if it is more than 1 hour until your next meal.  There may be a time when your blood glucose may go so low that you are unable to treat yourself at home when you start to notice symptoms. You may need someone to help you. You may even faint or be unable to swallow. If you cannot treat yourself, someone will need to bring you to the hospital.  [place]  If you have diabetes, follow your diabetes management plan by:  Taking your medicines as directed.  Following your exercise plan.  Following your meal plan. Do not skip meals. Eat on time.  Testing your blood  glucose regularly. Check your blood glucose before and after exercise. If you exercise longer or different than usual, be sure to check blood glucose more frequently.  Wearing your medical alert jewelry that says you have diabetes.  Identify the cause of your hypoglycemia. Then, develop ways to prevent the recurrence of hypoglycemia.  Do not take a hot bath or shower right after an  insulin shot.  Always carry treatment with you. Glucose tablets are the easiest to carry.  If you are going to drink alcohol, drink it only with meals.  Tell friends or family members ways to keep you safe during a seizure. This may include removing hard or sharp objects from the area or turning you on your side.  Maintain a healthy weight. SEEK MEDICAL CARE IF:   You are having problems keeping your blood glucose in your target range.  You are having frequent episodes of hypoglycemia.  You feel you might be having side effects from your medicines.  You are not sure why your blood glucose is dropping so low.  You notice a change in vision or a new problem with your vision. SEEK IMMEDIATE MEDICAL CARE IF:   Confusion develops.  A change in mental status occurs.  The inability to swallow develops.  Fainting occurs.   This information is not intended to replace advice given to you by your health care provider. Make sure you discuss any questions you have with your health care provider.   Document Released: 11/01/2005 Document Revised: 11/06/2013 Document Reviewed: 07/08/2015 Elsevier Interactive Patient Education 2016 [employer]  Blood Glucose Monitoring, Adult Monitoring your blood glucose (also know as blood sugar) helps you to manage your diabetes. It also helps you and your health care provider monitor your diabetes and determine how well your treatment plan is working. WHY SHOULD YOU MONITOR YOUR BLOOD GLUCOSE?  It can help you understand how food, exercise, and medicine affect your blood glucose.  It allows you to know what your blood glucose is at any given moment. You can quickly tell if you are having low blood glucose (hypoglycemia) or high blood glucose (hyperglycemia).  It can help you and your health care provider know how to adjust your medicines.  It can help you understand how to manage an illness or adjust medicine for exercise. WHEN SHOULD YOU  TEST? Your health care provider will help you decide how often you should check your blood glucose. This may depend on the type of diabetes you have, your diabetes control, or the types of medicines you are taking. Be sure to write down all of your blood glucose readings so that this information can be reviewed with your health care provider. See below for examples of testing times that your health care provider may suggest. Type 1 Diabetes  Test at least 2 times per day if your diabetes is well controlled, if you are using an insulin pump, or if you perform multiple daily injections.  If your diabetes is not well controlled or if you are sick, you may need to test more often.  It is a good idea to also test:  Before every insulin injection.  Before and after exercise.  Between meals and 2 hours after a meal.  Occasionally between 2:00 a.m. and 3:00 a.m. Type 2 Diabetes  If you are taking insulin, test at least 2 times per day. However, it is best to test before every insulin injection.  If you take medicines by mouth (orally), test 2 times a  day.  If you are on a controlled diet, test once a day.  If your diabetes is not well controlled or if you are sick, you may need to monitor more often. HOW TO MONITOR YOUR BLOOD GLUCOSE Supplies Needed  Blood glucose meter.  Test strips for your meter. Each meter has its own strips. You must use the strips that go with your own meter.  A pricking needle (lancet).  A device that holds the lancet (lancing device).  A journal or log book to write down your results. Procedure  Wash your hands with soap and water. Alcohol is not preferred.  Prick the side of your finger (not the tip) with the lancet.  Gently milk the finger until a small drop of blood appears.  Follow the instructions that come with your meter for inserting the test strip, applying blood to the strip, and using your blood glucose meter. Other Areas to Get Blood for  Testing Some meters allow you to use other areas of your body (other than your finger) to test your blood. These areas are called alternative sites. The most common alternative sites are:  The forearm.  The thigh.  The back area of the lower leg.  The palm of the hand. The blood flow in these areas is slower. Therefore, the blood glucose values you get may be delayed, and the numbers are different from what you would get from your fingers. Do not use alternative sites if you think you are having hypoglycemia. Your reading will not be accurate. Always use a finger if you are having hypoglycemia. Also, if you cannot feel your lows (hypoglycemia unawareness), always use your fingers for your blood glucose checks. ADDITIONAL TIPS FOR GLUCOSE MONITORING  Do not reuse lancets.  Always carry your supplies with you.  All blood glucose meters have a 24-hour "hotline" number to call if you have questions or need help.  Adjust (calibrate) your blood glucose meter with a control solution after finishing a few boxes of strips. BLOOD GLUCOSE RECORD KEEPING It is a good idea to keep a daily record or log of your blood glucose readings. Most glucose meters, if not all, keep your glucose records stored in the meter. Some meters come with the ability to download your records to your home computer. Keeping a record of your blood glucose readings is especially helpful if you are wanting to look for patterns. Make notes to go along with the blood glucose readings because you might forget what happened at that exact time. Keeping good records helps you and your health care provider to work together to achieve good diabetes management.    This information is not intended to replace advice given to you by your health care provider. Make sure you discuss any questions you have with your health care provider.   Document Released: 11/04/2003 Document Revised: 11/22/2014 Document Reviewed: 03/26/2013 Elsevier  Interactive Patient Education [employer].

## 2015-10-30 NOTE — ED Notes (Signed)
CBG 134. RN notified.

## 2015-10-30 NOTE — ED Notes (Signed)
Patient transported to CT 

## 2015-10-30 NOTE — ED Notes (Signed)
Per EMS pt's wife heard a "thump" in the living room and found pt on the floor in a sitting position.  He was cool and clamie, CBG as 42.  Pt was given 2 cups of OJ and 1mg  of glucagon his CBG then read 51 and then at 3:40 it was 68.  It should be noted that his primary provider has been working w/ him to adjust his medications.  The EKG noted a left bundle block which pt reports he has a history of.

## 2015-10-30 NOTE — ED Provider Notes (Signed)
By signing my name below, I, Soijett Blue, attest that this documentation has been prepared under the direction and in the presence of [employer], DO. Electronically Signed: Soijett Blue, ED Scribe. 10/30/2015. 4:08 AM.  TIME SEEN: 3:58 AM  CHIEF COMPLAINT: Hypoglycemia  HPI:  Jesus Young is a 69 y.o. male with a medical hx of IDDM who presents to the Emergency Department via EMS complaining of hypoglycemia onset PTA. He notes that he normally takes humalog 5 units at lunch and 9 units at dinner and levemir 16 units BID for his diabetes that he takes BID. Due to financial issues, his PCP gave him samples of novolog and Levemir and that he took 16 units of novolog as a substitute for Levemir at 12:30 AM tonight while still using his humalog. He notes that he has been using novolog x 5 days. He states that he doesn't have anymore Levemir but he still has novolog at this time. He notes that he thinks that his blood sugar was low last night although he didn't check for the actual number because he felt "woozy".  Wife notes that she heard the pt get up and she states that the pt was flailing around and she noted that the pt was diaphoretic and she gave him a cookie. Wife reports that the pt fell to the ground while trying to stand up and that he was flailing around, throwing things, appeared agitated. She states that the pt was awake and there was no seizure-like activity. No incontinence or tongue biting.  He states that he is having associated symptoms of cough x 2 months. He reports that he has had not had a CXR completed since the onset of his cough 2 months ago. He denies fever, v/d, appetite change, and any other symptoms. He states that he is on Plavix at this time.   Denies allergies to any medications.   Pt PCP: Merrilee Seashore, MD    ROS: See HPI Constitutional: no fever  Eyes: no drainage  ENT: no runny nose   Cardiovascular:  no chest pain  Resp: no SOB  GI: no  vomiting GU: no dysuria Integumentary: no rash  Allergy: no hives  Musculoskeletal: no leg swelling  Neurological: no slurred speech ROS otherwise negative  PAST MEDICAL HISTORY/PAST SURGICAL HISTORY:  Past Medical History  Diagnosis Date  . Diabetes mellitus without complication     takes Levemir and Humalog daily  . GERD (gastroesophageal reflux disease)     takes Protonix daily and Zantac  . Hypertension     takes Metoprolol daily  . Hypothyroidism     takes Synthroid daily  . Hyperlipidemia     takes Zocor every evening  . Night muscle spasms     takes Robaxin 4 x day   . Constipation     takes Colace at bedtime  . Cancer     prostate  . Myocardial infarction [facility])     many yrs before 2002  . Arthritis     back and knees  . Chronic back pain   . Coronary artery disease     takes Plavix daily but is on hold for surgery  . Hemorrhoids   . History of kidney stones   . Cataract     immature on right  . Bronchitis     hx of 2015  . Dizziness   . H/O urinary frequency   . History of bladder infections   . Nocturia   . Fall  hx of with injury to left shoulder     MEDICATIONS:  Prior to Admission medications   Medication Sig Start Date End Date Taking? Authorizing Provider  aspirin EC 81 MG tablet Take 81 mg by mouth at bedtime.     Historical Provider, MD  celecoxib (CELEBREX) 200 MG capsule Take 200 mg by mouth at bedtime.     Historical Provider, MD  clopidogrel (PLAVIX) 75 MG tablet Take 75 mg by mouth at bedtime. 05/26/15   Historical Provider, MD  diclofenac (CATAFLAM) 50 MG tablet Take 50 mg by mouth 2 (two) times daily. 05/26/15   Historical Provider, MD  docusate sodium (COLACE) 100 MG capsule Take 100-300 mg by mouth at bedtime as needed for mild constipation (constipation).     Historical Provider, MD  docusate sodium (COLACE) 100 MG capsule Take 1 capsule (100 mg total) by mouth 2 (two) times daily. Patient not taking: Reported on 07/22/2015 06/23/15    [name], MD  HYDROcodone-acetaminophen (NORCO/VICODIN) 5-325 MG per tablet Take 1-2 tablets by mouth every 6 (six) hours as needed. Patient not taking: Reported on 07/22/2015 06/23/15   [name], MD  insulin detemir (LEVEMIR) 100 UNIT/ML injection Inject 14-16 Units into the skin 2 (two) times daily. 16 units in the am, and 14 units in the pm.    Historical Provider, MD  insulin lispro (HUMALOG) 100 UNIT/ML injection Inject 5-9 Units into the skin 2 (two) times daily. 5 units at lunch, and 9 units after dinner.    Historical Provider, MD  levothyroxine (SYNTHROID, LEVOTHROID) 88 MCG tablet Take 88 mcg by mouth daily. 07/06/15   Historical Provider, MD  losartan (COZAAR) 25 MG tablet Take 25 mg by mouth at bedtime.    Historical Provider, MD  metoprolol (LOPRESSOR) 50 MG tablet Take 50 mg by mouth 2 (two) times daily.     Historical Provider, MD  Probiotic Product (PROBIOTIC PO) Take 1 capsule by mouth daily.    Historical Provider, MD  RABEprazole (ACIPHEX) 20 MG tablet Take 20 mg by mouth at bedtime.  05/03/15   Historical Provider, MD  ranitidine (ZANTAC) 300 MG tablet Take 300 mg by mouth at bedtime.    Historical Provider, MD  simvastatin (ZOCOR) 40 MG tablet Take 40 mg by mouth every evening.    Historical Provider, MD    ALLERGIES:  No Known Allergies  SOCIAL HISTORY:  Social History  Substance Use Topics  . Smoking status: Never Smoker   . Smokeless tobacco: Never Used  . Alcohol Use: No    FAMILY HISTORY: No family history on file.  EXAM: BP 140/78 mmHg  Pulse 66  Temp(Src) 97.8 F (36.6 C) (Oral)  Resp 15  SpO2 98% CONSTITUTIONAL: Alert and oriented and responds appropriately to questions. Well-appearing; well-nourished HEAD: Normocephalic, atraumatic EYES: Conjunctivae clear, PERRL ENT: normal nose; no rhinorrhea; moist mucous membranes; pharynx without lesions noted NECK: Supple, no meningismus, no LAD, no midline spinal tenderness or step-off or deformity   CARD: RRR; S1 and S2 appreciated; no murmurs, no clicks, no rubs, no gallops RESP: Normal chest excursion without splinting or tachypnea; breath sounds clear and equal bilaterally; no wheezes, no rhonchi, no rales, no hypoxia or respiratory distress, speaking full sentences ABD/GI: Normal bowel sounds; non-distended; soft, non-tender, no rebound, no guarding, no peritoneal signs BACK:  The back appears normal and is non-tender to palpation, there is no CVA tenderness and no midline spinal tenderness or step-off or deformity EXT: Normal ROM in all joints; non-tender to  palpation; no edema; normal capillary refill; no cyanosis, no calf tenderness or swelling; no bony tenderness or deformity, no ecchymosis   SKIN: Normal color for age and race; warm NEURO: Moves all extremities equally, sensation to light touch intact diffusely, cranial nerves II through XII intact PSYCH: The patient's mood and manner are appropriate. Grooming and personal hygiene are appropriate.  MEDICAL DECISION MAKING: Patient here with an episode of hypoglycemia likely from taking his insulin incorrectly. He has had a cough for 2 months but no other infectious symptoms. States he is eating and drinking well. Wife reports he did fall and he is on Plavix. Will obtain a head CT. He is neurologically intact. No complaints of pain and no sign of trauma on exam.  ED PROGRESS: Patient's labs are unremarkable. He does have chronic kidney disease which is stable. Urine shows small hemoglobin and rare bacteria. Culture is pending. He has no urinary symptoms. Discussed his hematuria, proteinuria with patient and have advised him to follow up with his PCP but suspect this is also chronic. His blood sugar continues to improve and be stable. He is eating and drinking without difficulty. Chest x-ray clear. Head CT shows no acute intracranial abnormality. I feel he is safe to be discharged home. Discussed head injury return precautions. Have refilled  his Humalog and Levemir and have advised him how to use this correctly. Discussed with patient that NovoLog should be used in place of Humalog and he surely take one or the other but Levemir is a long-acting insulin that he should be using twice a day as prescribed. I recommended he keep a log of his blood glucose and follow-up with his PCP. Patient and wife at bedside verbalize understanding and are comfortable with this plan.  I personally performed the services described in this documentation, which was scribed in my presence. The recorded information has been reviewed and is accurate.     [place], DO 10/30/15 [phone]

## 2015-10-30 NOTE — ED Notes (Signed)
Patient transported to X-ray 

## 2015-10-30 NOTE — ED Notes (Signed)
CBG 94 

## 2015-10-31 LAB — URINE CULTURE: Culture: NO GROWTH

## 2015-12-12 DIAGNOSIS — N209 Urinary calculus, unspecified: Secondary | ICD-10-CM | POA: Diagnosis not present

## 2015-12-31 DIAGNOSIS — E1165 Type 2 diabetes mellitus with hyperglycemia: Secondary | ICD-10-CM | POA: Diagnosis not present

## 2015-12-31 DIAGNOSIS — E7800 Pure hypercholesterolemia, unspecified: Secondary | ICD-10-CM | POA: Diagnosis not present

## 2015-12-31 DIAGNOSIS — I10 Essential (primary) hypertension: Secondary | ICD-10-CM | POA: Diagnosis not present

## 2015-12-31 DIAGNOSIS — E039 Hypothyroidism, unspecified: Secondary | ICD-10-CM | POA: Diagnosis not present

## 2016-01-24 IMAGING — CR DG CHEST 2V
2 series · 2 of 2 positions shown · non-contrast
Comparison: 02/28/2015

CLINICAL DATA: Preop cardiovascular exam.  Coronary artery disease.

EXAM:
CHEST  2 VIEW

[w chest pa]
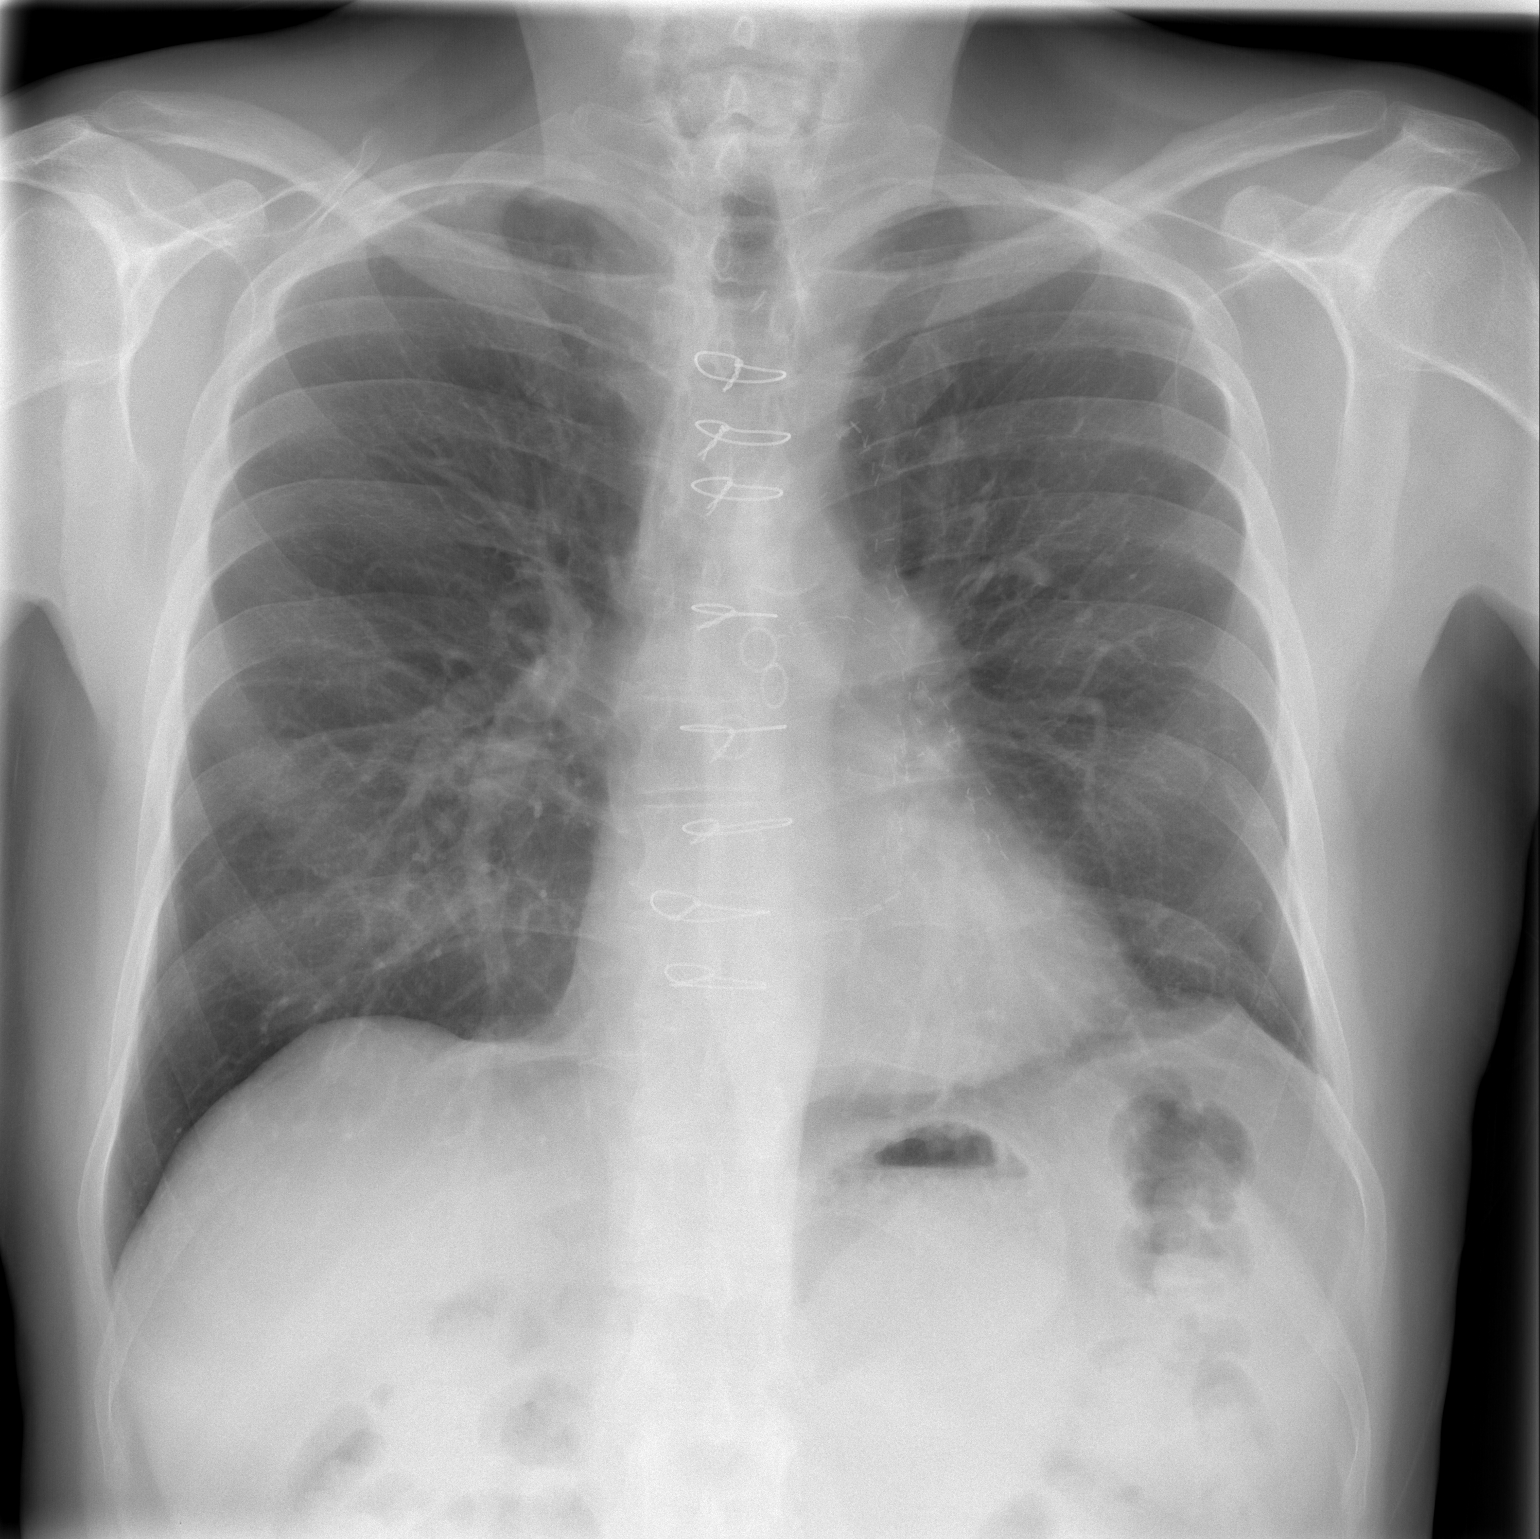

[w chest lat]
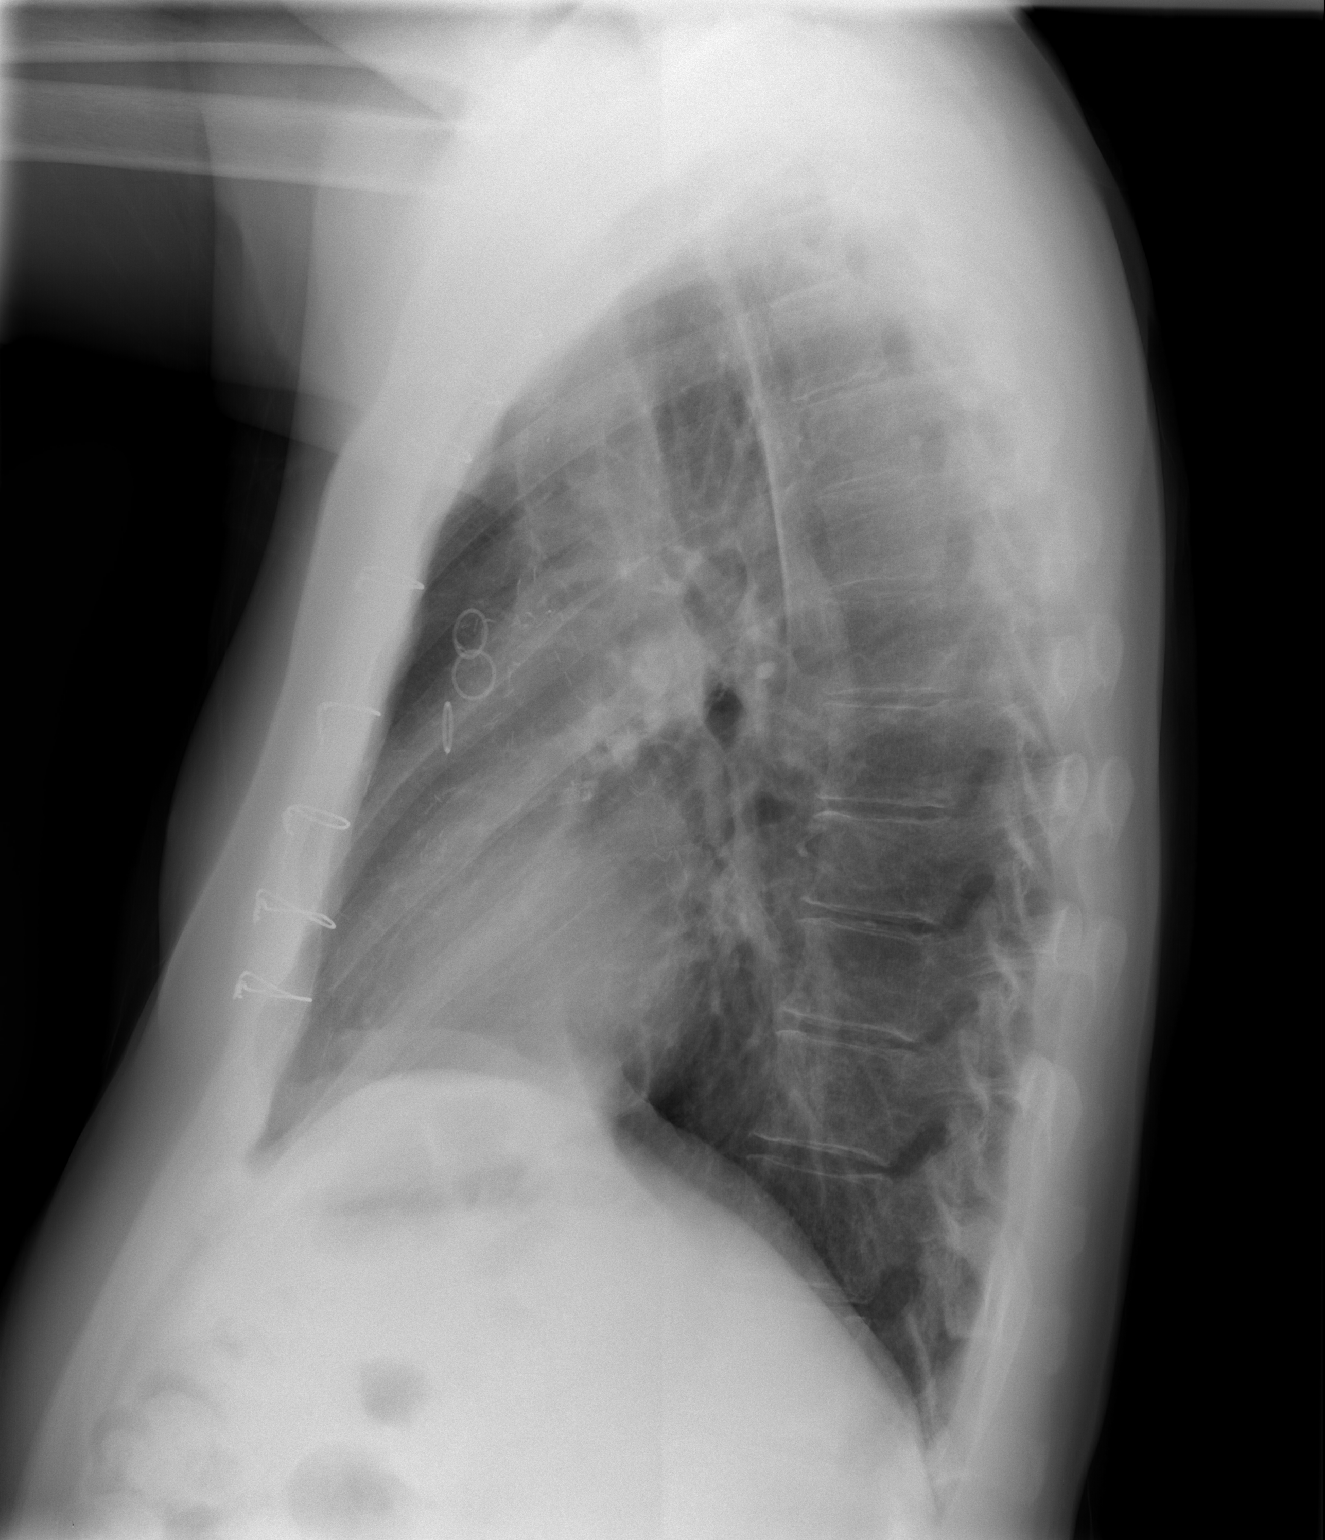

[2 of 2 positions shown; findings below may reference images not displayed]

FINDINGS: The heart size and mediastinal contours are within normal limits.
Both lungs are clear. No evidence of pneumothorax or pleural
effusion. Prior CABG again noted.
IMPRESSION: Stable exam.  No active cardiopulmonary disease.

## 2016-02-18 DIAGNOSIS — E7800 Pure hypercholesterolemia, unspecified: Secondary | ICD-10-CM | POA: Diagnosis not present

## 2016-02-18 DIAGNOSIS — I2510 Atherosclerotic heart disease of native coronary artery without angina pectoris: Secondary | ICD-10-CM | POA: Diagnosis not present

## 2016-02-18 DIAGNOSIS — N189 Chronic kidney disease, unspecified: Secondary | ICD-10-CM | POA: Diagnosis not present

## 2016-02-18 DIAGNOSIS — I10 Essential (primary) hypertension: Secondary | ICD-10-CM | POA: Diagnosis not present

## 2016-02-19 DIAGNOSIS — I2510 Atherosclerotic heart disease of native coronary artery without angina pectoris: Secondary | ICD-10-CM | POA: Diagnosis not present

## 2016-02-19 DIAGNOSIS — E119 Type 2 diabetes mellitus without complications: Secondary | ICD-10-CM | POA: Diagnosis not present

## 2016-02-19 DIAGNOSIS — N189 Chronic kidney disease, unspecified: Secondary | ICD-10-CM | POA: Diagnosis not present

## 2016-02-19 DIAGNOSIS — I10 Essential (primary) hypertension: Secondary | ICD-10-CM | POA: Diagnosis not present

## 2016-02-19 DIAGNOSIS — E7800 Pure hypercholesterolemia, unspecified: Secondary | ICD-10-CM | POA: Diagnosis not present

## 2016-02-19 DIAGNOSIS — Z0100 Encounter for examination of eyes and vision without abnormal findings: Secondary | ICD-10-CM | POA: Diagnosis not present

## 2016-02-20 DIAGNOSIS — N209 Urinary calculus, unspecified: Secondary | ICD-10-CM | POA: Diagnosis not present

## 2016-02-20 DIAGNOSIS — C61 Malignant neoplasm of prostate: Secondary | ICD-10-CM | POA: Diagnosis not present

## 2016-02-25 DIAGNOSIS — E7800 Pure hypercholesterolemia, unspecified: Secondary | ICD-10-CM | POA: Diagnosis not present

## 2016-02-25 DIAGNOSIS — I2510 Atherosclerotic heart disease of native coronary artery without angina pectoris: Secondary | ICD-10-CM | POA: Diagnosis not present

## 2016-02-25 DIAGNOSIS — R05 Cough: Secondary | ICD-10-CM | POA: Diagnosis not present

## 2016-02-25 DIAGNOSIS — N189 Chronic kidney disease, unspecified: Secondary | ICD-10-CM | POA: Diagnosis not present

## 2016-03-03 DIAGNOSIS — C61 Malignant neoplasm of prostate: Secondary | ICD-10-CM | POA: Diagnosis not present

## 2016-03-03 DIAGNOSIS — Z0000 Encounter for general adult medical examination without abnormal findings: Secondary | ICD-10-CM | POA: Diagnosis not present

## 2016-03-03 DIAGNOSIS — N209 Urinary calculus, unspecified: Secondary | ICD-10-CM | POA: Diagnosis not present

## 2016-03-16 DIAGNOSIS — R51 Headache: Secondary | ICD-10-CM

## 2016-03-16 DIAGNOSIS — J3089 Other allergic rhinitis: Secondary | ICD-10-CM | POA: Insufficient documentation

## 2016-03-16 DIAGNOSIS — R059 Cough, unspecified: Secondary | ICD-10-CM | POA: Insufficient documentation

## 2016-03-16 DIAGNOSIS — R519 Headache, unspecified: Secondary | ICD-10-CM | POA: Insufficient documentation

## 2016-03-16 DIAGNOSIS — R05 Cough: Secondary | ICD-10-CM | POA: Insufficient documentation

## 2016-03-16 DIAGNOSIS — R0982 Postnasal drip: Secondary | ICD-10-CM | POA: Insufficient documentation

## 2016-03-16 DIAGNOSIS — R0981 Nasal congestion: Secondary | ICD-10-CM | POA: Insufficient documentation

## 2016-04-27 DIAGNOSIS — E7800 Pure hypercholesterolemia, unspecified: Secondary | ICD-10-CM | POA: Diagnosis not present

## 2016-04-27 DIAGNOSIS — E1165 Type 2 diabetes mellitus with hyperglycemia: Secondary | ICD-10-CM | POA: Diagnosis not present

## 2016-04-27 DIAGNOSIS — I10 Essential (primary) hypertension: Secondary | ICD-10-CM | POA: Diagnosis not present

## 2016-04-27 DIAGNOSIS — E039 Hypothyroidism, unspecified: Secondary | ICD-10-CM | POA: Diagnosis not present

## 2016-04-28 DIAGNOSIS — E039 Hypothyroidism, unspecified: Secondary | ICD-10-CM | POA: Diagnosis not present

## 2016-06-23 DIAGNOSIS — E7800 Pure hypercholesterolemia, unspecified: Secondary | ICD-10-CM | POA: Diagnosis not present

## 2016-06-23 DIAGNOSIS — Z0000 Encounter for general adult medical examination without abnormal findings: Secondary | ICD-10-CM | POA: Diagnosis not present

## 2016-06-23 DIAGNOSIS — I2510 Atherosclerotic heart disease of native coronary artery without angina pectoris: Secondary | ICD-10-CM | POA: Diagnosis not present

## 2016-06-23 DIAGNOSIS — E1165 Type 2 diabetes mellitus with hyperglycemia: Secondary | ICD-10-CM | POA: Diagnosis not present

## 2016-07-07 DIAGNOSIS — I2510 Atherosclerotic heart disease of native coronary artery without angina pectoris: Secondary | ICD-10-CM | POA: Diagnosis not present

## 2016-07-07 DIAGNOSIS — N183 Chronic kidney disease, stage 3 (moderate): Secondary | ICD-10-CM | POA: Diagnosis not present

## 2016-07-07 DIAGNOSIS — Z794 Long term (current) use of insulin: Secondary | ICD-10-CM | POA: Diagnosis not present

## 2016-07-07 DIAGNOSIS — E1122 Type 2 diabetes mellitus with diabetic chronic kidney disease: Secondary | ICD-10-CM | POA: Diagnosis not present

## 2016-07-20 DIAGNOSIS — M79676 Pain in unspecified toe(s): Secondary | ICD-10-CM | POA: Diagnosis not present

## 2016-07-20 DIAGNOSIS — E7800 Pure hypercholesterolemia, unspecified: Secondary | ICD-10-CM | POA: Diagnosis not present

## 2016-07-20 DIAGNOSIS — Z23 Encounter for immunization: Secondary | ICD-10-CM | POA: Diagnosis not present

## 2016-07-20 DIAGNOSIS — E1165 Type 2 diabetes mellitus with hyperglycemia: Secondary | ICD-10-CM | POA: Diagnosis not present

## 2016-07-20 DIAGNOSIS — E039 Hypothyroidism, unspecified: Secondary | ICD-10-CM | POA: Diagnosis not present

## 2016-07-27 DIAGNOSIS — M216X2 Other acquired deformities of left foot: Secondary | ICD-10-CM | POA: Diagnosis not present

## 2016-07-27 DIAGNOSIS — L84 Corns and callosities: Secondary | ICD-10-CM | POA: Diagnosis not present

## 2016-07-27 DIAGNOSIS — E119 Type 2 diabetes mellitus without complications: Secondary | ICD-10-CM | POA: Diagnosis not present

## 2016-07-27 DIAGNOSIS — L602 Onychogryphosis: Secondary | ICD-10-CM | POA: Diagnosis not present

## 2016-07-27 DIAGNOSIS — M216X1 Other acquired deformities of right foot: Secondary | ICD-10-CM | POA: Diagnosis not present

## 2016-08-30 DIAGNOSIS — I10 Essential (primary) hypertension: Secondary | ICD-10-CM | POA: Diagnosis not present

## 2016-08-30 DIAGNOSIS — E7800 Pure hypercholesterolemia, unspecified: Secondary | ICD-10-CM | POA: Diagnosis not present

## 2016-08-30 DIAGNOSIS — E039 Hypothyroidism, unspecified: Secondary | ICD-10-CM | POA: Diagnosis not present

## 2016-08-30 DIAGNOSIS — E1165 Type 2 diabetes mellitus with hyperglycemia: Secondary | ICD-10-CM | POA: Diagnosis not present

## 2016-08-31 DIAGNOSIS — M25561 Pain in right knee: Secondary | ICD-10-CM | POA: Diagnosis not present

## 2016-09-21 DIAGNOSIS — M5136 Other intervertebral disc degeneration, lumbar region: Secondary | ICD-10-CM | POA: Diagnosis not present

## 2016-09-21 DIAGNOSIS — M5442 Lumbago with sciatica, left side: Secondary | ICD-10-CM | POA: Diagnosis not present

## 2016-11-21 ENCOUNTER — Encounter (HOSPITAL_COMMUNITY): Payer: Self-pay | Admitting: Emergency Medicine

## 2016-11-21 ENCOUNTER — Emergency Department (HOSPITAL_COMMUNITY)
Admission: EM | Admit: 2016-11-21 | Discharge: 2016-11-21 | Disposition: A | Discharge status: Home / Self Care | Payer: Medicare Other | Attending: Emergency Medicine | Admitting: Emergency Medicine

## 2016-11-21 DIAGNOSIS — I10 Essential (primary) hypertension: Secondary | ICD-10-CM | POA: Insufficient documentation

## 2016-11-21 DIAGNOSIS — E039 Hypothyroidism, unspecified: Secondary | ICD-10-CM | POA: Diagnosis not present

## 2016-11-21 DIAGNOSIS — I252 Old myocardial infarction: Secondary | ICD-10-CM | POA: Diagnosis not present

## 2016-11-21 DIAGNOSIS — Z79899 Other long term (current) drug therapy: Secondary | ICD-10-CM | POA: Diagnosis not present

## 2016-11-21 DIAGNOSIS — Z794 Long term (current) use of insulin: Secondary | ICD-10-CM | POA: Diagnosis not present

## 2016-11-21 DIAGNOSIS — Z7982 Long term (current) use of aspirin: Secondary | ICD-10-CM | POA: Insufficient documentation

## 2016-11-21 DIAGNOSIS — Z951 Presence of aortocoronary bypass graft: Secondary | ICD-10-CM | POA: Insufficient documentation

## 2016-11-21 DIAGNOSIS — E1165 Type 2 diabetes mellitus with hyperglycemia: Secondary | ICD-10-CM | POA: Insufficient documentation

## 2016-11-21 DIAGNOSIS — Z8546 Personal history of malignant neoplasm of prostate: Secondary | ICD-10-CM | POA: Diagnosis not present

## 2016-11-21 DIAGNOSIS — R112 Nausea with vomiting, unspecified: Secondary | ICD-10-CM | POA: Diagnosis not present

## 2016-11-21 DIAGNOSIS — K529 Noninfective gastroenteritis and colitis, unspecified: Secondary | ICD-10-CM | POA: Diagnosis not present

## 2016-11-21 DIAGNOSIS — R739 Hyperglycemia, unspecified: Secondary | ICD-10-CM

## 2016-11-21 DIAGNOSIS — E860 Dehydration: Secondary | ICD-10-CM | POA: Diagnosis not present

## 2016-11-21 LAB — CBC
HCT: 51.9 % (ref 39.0–52.0)
HEMOGLOBIN: 16.7 g/dL (ref 13.0–17.0)
MCH: 30.9 pg (ref 26.0–34.0)
MCHC: 32.2 g/dL (ref 30.0–36.0)
MCV: 96.1 fL (ref 78.0–100.0)
Platelets: 142 10*3/uL — ABNORMAL LOW (ref 150–400)
RBC: 5.4 MIL/uL (ref 4.22–5.81)
RDW: 13.4 % (ref 11.5–15.5)
WBC: 6.4 10*3/uL (ref 4.0–10.5)

## 2016-11-21 LAB — COMPREHENSIVE METABOLIC PANEL
ALBUMIN: 4.2 g/dL (ref 3.5–5.0)
ALK PHOS: 46 U/L (ref 38–126)
ALT: 47 U/L (ref 17–63)
AST: 37 U/L (ref 15–41)
Anion gap: 11 (ref 5–15)
BUN: 36 mg/dL — ABNORMAL HIGH (ref 6–20)
CHLORIDE: 102 mmol/L (ref 101–111)
CO2: 24 mmol/L (ref 22–32)
CREATININE: 1.57 mg/dL — AB (ref 0.61–1.24)
Calcium: 8.8 mg/dL — ABNORMAL LOW (ref 8.9–10.3)
GFR calc non Af Amer: 43 mL/min — ABNORMAL LOW (ref >60)
GFR, EST AFRICAN AMERICAN: 50 mL/min — AB (ref >60)
GLUCOSE: 226 mg/dL — AB (ref 65–99)
Potassium: 5.1 mmol/L (ref 3.5–5.1)
SODIUM: 137 mmol/L (ref 135–145)
Total Bilirubin: 0.9 mg/dL (ref 0.3–1.2)
Total Protein: 6.6 g/dL (ref 6.5–8.1)

## 2016-11-21 LAB — CBG MONITORING, ED: Glucose-Capillary: 246 mg/dL — ABNORMAL HIGH (ref 65–99)

## 2016-11-21 LAB — LIPASE, BLOOD: LIPASE: 29 U/L (ref 11–51)

## 2016-11-21 MED ORDER — SODIUM CHLORIDE 0.9 % IV SOLN: INTRAVENOUS | Status: DC

## 2016-11-21 MED ORDER — ONDANSETRON HCL 4 MG/2ML IJ SOLN
4.0000 mg | Freq: Once | INTRAMUSCULAR | Status: AC
  Administered 2016-11-21: 4 mg via INTRAVENOUS
  Filled 2016-11-21: qty 2

## 2016-11-21 MED ORDER — SODIUM CHLORIDE 0.9 % IV BOLUS (SEPSIS)
500.0000 mL | Freq: Once | INTRAVENOUS | Status: AC
  Administered 2016-11-21: 500 mL via INTRAVENOUS

## 2016-11-21 MED ORDER — ONDANSETRON 4 MG PO TBDP: 4.0000 mg | ORAL_TABLET | Freq: Three times a day (TID) | ORAL | 1 refills | Status: DC | PRN

## 2016-11-21 NOTE — Discharge Instructions (Signed)
Symptoms and labs consistent with a viral gastroenteritis. Continue take Zofran as needed. With the not treat blood sugar and less blood sugar level goes above 300. Recommend clear liquid diet and advance to bland diet. Return for any new or worse symptoms. Follow-up with your doctor as needed.

## 2016-11-21 NOTE — ED Provider Notes (Signed)
[facility] DEPT Provider Note   CSN: [id number] Arrival date & time: 11/21/16  0802     History   Chief Complaint Chief Complaint  Patient presents with  . Nausea  . Emesis  . Generalized Body Aches  . Chills    HPI Jesus Young is a 71 y.o. male.  Patient with acute onset of nausea and vomiting about 3 in the morning. Also one loose bowel movement. Vomited 3 times. IV fluids started by EMS and Zofran provided by EMS. Patient felt fine yesterday. Patient's wife with a vomiting type illness about a week ago. Otherwise no sick contacts. No significant abdominal pain. No blood in the vomit. Patient is a diabetic did not take any diabetic medicines morning. Blood sugars are been okay. Has not anything to eat.      Past Medical History:  Diagnosis Date  . Arthritis    back and knees  . Bronchitis    hx of 2015  . Cancer [facility])    prostate  . Cataract    immature on right  . Chronic back pain   . Constipation    takes Colace at bedtime  . Coronary artery disease    takes Plavix daily but is on hold for surgery  . Diabetes mellitus without complication ([place])    takes Levemir and Humalog daily  . Dizziness   . Fall    hx of with injury to left shoulder   . GERD (gastroesophageal reflux disease)    takes Protonix daily and Zantac  . H/O urinary frequency   . Hemorrhoids   . History of bladder infections   . History of kidney stones   . Hyperlipidemia    takes Zocor every evening  . Hypertension    takes Metoprolol daily  . Hypothyroidism    takes Synthroid daily  . Myocardial infarction    many yrs before 2002  . Night muscle spasms    takes Robaxin 4 x day   . Nocturia     Patient Active Problem List   Diagnosis Date Noted  . Renal calculus 06/23/2015  . Renal calculus, left     Past Surgical History:  Procedure Laterality Date  . CARDIAC CATHETERIZATION  05/17/02  . COLONOSCOPY    . CORONARY ANGIOPLASTY     x 1   . CORONARY ARTERY BYPASS  GRAFT  2002   x 5  . eyelid surgery     . LUMBAR LAMINECTOMY/DECOMPRESSION MICRODISCECTOMY Bilateral 07/18/2013   Procedure: Bilateral Lumbar four-five Lumbar Laminotomy/Microdiskectomy;  Surgeon: Hosie Spangle, MD;  Location: [place] NEURO ORS;  Service: Neurosurgery;  Laterality: Bilateral;  Bilateral Lumbar four-five Lumbar Laminotomy/Microdiskectomy  . NEPHROLITHOTOMY Left 06/23/2015   Procedure: NEPHROLITHOTOMY PERCUTANEOUS;  Surgeon: [name], MD;  Location: [facility] ORS;  Service: Urology;  Laterality: Left;  . PROSTATECTOMY  10/2009  . skin spots removed from back -precancerous    . TONSILLECTOMY         Home Medications    Prior to Admission medications   Medication Sig Start Date End Date Taking? Authorizing Provider  acetaminophen (TYLENOL) 650 MG CR tablet Take 1,300 mg by mouth every 8 (eight) hours as needed for pain.   Yes Historical Provider, MD  aspirin EC 81 MG tablet Take 81 mg by mouth at bedtime.    Yes Historical Provider, MD  clonazePAM (KLONOPIN) 0.5 MG tablet Take 0.5 mg by mouth 2 (two) times daily. 11/18/16  Yes Historical Provider, MD  clopidogrel (PLAVIX) 75 MG  tablet Take 75 mg by mouth at bedtime. 05/26/15  Yes Historical Provider, MD  docusate sodium (COLACE) 100 MG capsule Take 100-300 mg by mouth at bedtime as needed for mild constipation (constipation).    Yes Historical Provider, MD  insulin detemir (LEVEMIR) 100 UNIT/ML injection Inject 0.16 mLs (16 Units total) into the skin 2 (two) times daily. Patient taking differently: Inject 18 Units into the skin 2 (two) times daily.  10/30/15  Yes Kristen N Ward, DO  insulin lispro (HUMALOG) 100 UNIT/ML injection Inject 0.05-0.09 mLs (5-9 Units total) into the skin 2 (two) times daily. 5 units at lunch, and 9 units after dinner. 10/30/15  Yes Kristen N Ward, DO  levothyroxine (SYNTHROID, LEVOTHROID) 100 MCG tablet Take 100 mcg by mouth daily. 11/16/16  Yes Historical Provider, MD  metoprolol (LOPRESSOR) 50 MG tablet Take  50 mg by mouth 2 (two) times daily.    Yes Historical Provider, MD  mineral oil-hydrophilic petrolatum (AQUAPHOR) ointment Apply 1 application topically as needed for dry skin.   Yes Historical Provider, MD  pantoprazole (PROTONIX) 40 MG tablet Take 40 mg by mouth daily. 11/16/16  Yes Historical Provider, MD  ranitidine (ZANTAC) 300 MG tablet Take 300 mg by mouth at bedtime.   Yes Historical Provider, MD  simvastatin (ZOCOR) 40 MG tablet Take 40 mg by mouth every evening.   Yes Historical Provider, MD  ondansetron (ZOFRAN ODT) 4 MG disintegrating tablet Take 1 tablet (4 mg total) by mouth every 8 (eight) hours as needed for nausea or vomiting. 11/21/16   Fredia Sorrow, MD    Family History No family history on file.  Social History Social History  Substance Use Topics  . Smoking status: Never Smoker  . Smokeless tobacco: Never Used  . Alcohol use No     Allergies   Patient has no known allergies.   Review of Systems Review of Systems  Constitutional: Negative for fever.  HENT: Negative for congestion.   Eyes: Negative for visual disturbance.  Respiratory: Negative for shortness of breath.   Cardiovascular: Negative for chest pain.  Gastrointestinal: Positive for diarrhea, nausea and vomiting. Negative for abdominal pain.  Genitourinary: Negative for dysuria.  Musculoskeletal: Negative for myalgias.  Skin: Negative for rash.  Neurological: Negative for headaches.  Hematological: Does not bruise/bleed easily.  Psychiatric/Behavioral: Negative for confusion.     Physical Exam Updated Vital Signs BP 128/70 (BP Location: Left Arm)   Pulse 103   Temp 98.9 F (37.2 C) (Oral)   Resp 16   Ht 6\' 3"  (1.905 m)   Wt 88.5 kg   SpO2 93%   BMI 24.37 kg/m   Physical Exam  Constitutional: He is oriented to person, place, and time. He appears well-developed and well-nourished. No distress.  HENT:  Head: Normocephalic and atraumatic.  Mouth/Throat: Oropharynx is clear and moist.    Eyes: Conjunctivae and EOM are normal. Pupils are equal, round, and reactive to light.  Neck: Normal range of motion. Neck supple.  Cardiovascular: Normal rate, regular rhythm and normal heart sounds.   Pulmonary/Chest: Effort normal and breath sounds normal. No respiratory distress.  Abdominal: Soft. Bowel sounds are normal. There is no tenderness.  Musculoskeletal: Normal range of motion.  Neurological: He is alert and oriented to person, place, and time. No cranial nerve deficit. He exhibits normal muscle tone. Coordination normal.  Skin: Skin is warm.  Nursing note and vitals reviewed.    ED Treatments / Results  Labs (all labs ordered are listed, but only  abnormal results are displayed) Labs Reviewed  COMPREHENSIVE METABOLIC PANEL - Abnormal; Notable for the following:       Result Value   Glucose, Bld 226 (*)    BUN 36 (*)    Creatinine, Ser 1.57 (*)    Calcium 8.8 (*)    GFR calc non Af Amer 43 (*)    GFR calc Af Amer 50 (*)    All other components within normal limits  CBC - Abnormal; Notable for the following:    Platelets 142 (*)    All other components within normal limits  CBG MONITORING, ED - Abnormal; Notable for the following:    Glucose-Capillary 246 (*)    All other components within normal limits  LIPASE, BLOOD    EKG  EKG Interpretation None       Radiology No results found.  Procedures Procedures (including critical care time)  Medications Ordered in ED Medications  0.9 %  sodium chloride infusion (not administered)  sodium chloride 0.9 % bolus 500 mL (500 mLs Intravenous New Bag/Given 11/21/16 0839)  ondansetron (ZOFRAN) injection 4 mg (4 mg Intravenous Given 11/21/16 0839)     Initial Impression / Assessment and Plan / ED Course  I have reviewed the triage vital signs and the nursing notes.  Pertinent labs & imaging results that were available during my care of the patient were reviewed by me and considered in my medical decision making  (see chart for details).  Clinical Course    Symptoms consistent with a viral gastroenteritis. Patient's blood sugars reasonably controlled at the moment. Patient feels much better with fluids and antinausea medicine. Abdomen soft nontender no acute abdominal process. Labs without significant abnormalities.   Final Clinical Impressions(s) / ED Diagnoses   Final diagnoses:  Gastroenteritis  Hyperglycemia    New Prescriptions New Prescriptions   ONDANSETRON (ZOFRAN ODT) 4 MG DISINTEGRATING TABLET    Take 1 tablet (4 mg total) by mouth every 8 (eight) hours as needed for nausea or vomiting.     Fredia Sorrow, MD 11/21/16 1124

## 2016-11-21 NOTE — ED Notes (Signed)
Bed: XB28 Expected date: 11/21/16 Expected time: 7:54 AM Means of arrival: Ambulance Comments: 71 yo N/V

## 2016-11-21 NOTE — ED Notes (Signed)
PT DISCHARGED. INSTRUCTIONS AND PRESCRIPTION GIVEN. AAOX4. PT IN NO APPARENT DISTRESS OR PAIN. THE OPPORTUNITY TO ASK QUESTIONS WAS PROVIDED. 

## 2016-11-21 NOTE — ED Triage Notes (Addendum)
Per EMS patient comes from home for n/v, body aches and chills that started earlier this morning. Patient has vomited 3 times today and states that is very rare for him.  EMS states that patient denies pain at this time. Patient's wife recently had stomach flu last week. patient has 20g in right forearm and was given Zofran 4mg  in route with EMS.

## 2016-12-23 DIAGNOSIS — J209 Acute bronchitis, unspecified: Secondary | ICD-10-CM | POA: Diagnosis not present

## 2017-01-04 DIAGNOSIS — E1165 Type 2 diabetes mellitus with hyperglycemia: Secondary | ICD-10-CM | POA: Diagnosis not present

## 2017-01-04 DIAGNOSIS — E7800 Pure hypercholesterolemia, unspecified: Secondary | ICD-10-CM | POA: Diagnosis not present

## 2017-01-10 DIAGNOSIS — N189 Chronic kidney disease, unspecified: Secondary | ICD-10-CM | POA: Diagnosis not present

## 2017-01-10 DIAGNOSIS — E7800 Pure hypercholesterolemia, unspecified: Secondary | ICD-10-CM | POA: Diagnosis not present

## 2017-01-10 DIAGNOSIS — E1165 Type 2 diabetes mellitus with hyperglycemia: Secondary | ICD-10-CM | POA: Diagnosis not present

## 2017-01-10 DIAGNOSIS — I2510 Atherosclerotic heart disease of native coronary artery without angina pectoris: Secondary | ICD-10-CM | POA: Diagnosis not present

## 2017-02-01 ENCOUNTER — Encounter: Payer: Self-pay | Admitting: Podiatry

## 2017-02-01 ENCOUNTER — Ambulatory Visit (INDEPENDENT_AMBULATORY_CARE_PROVIDER_SITE_OTHER): Payer: Medicare Other

## 2017-02-01 ENCOUNTER — Ambulatory Visit (INDEPENDENT_AMBULATORY_CARE_PROVIDER_SITE_OTHER): Payer: Medicare Other | Admitting: Podiatry

## 2017-02-01 DIAGNOSIS — M2041 Other hammer toe(s) (acquired), right foot: Secondary | ICD-10-CM | POA: Diagnosis not present

## 2017-02-01 DIAGNOSIS — M2042 Other hammer toe(s) (acquired), left foot: Secondary | ICD-10-CM

## 2017-02-01 DIAGNOSIS — E119 Type 2 diabetes mellitus without complications: Secondary | ICD-10-CM

## 2017-02-01 DIAGNOSIS — M79676 Pain in unspecified toe(s): Secondary | ICD-10-CM | POA: Diagnosis not present

## 2017-02-01 DIAGNOSIS — Q828 Other specified congenital malformations of skin: Secondary | ICD-10-CM | POA: Diagnosis not present

## 2017-02-01 DIAGNOSIS — B351 Tinea unguium: Secondary | ICD-10-CM

## 2017-02-01 NOTE — Patient Instructions (Signed)

## 2017-02-01 NOTE — Progress Notes (Signed)
   Subjective:    [name], male    DOB: [birth year], 71 y.o.   MRN: [id number]  HPI: He presents today with a chief complaint of painful callus to the plantar aspect of his left foot more so than the right. He is also stating that his toenails are long and thick. He does not want his large toenails cut. He relates that he developed some burning sensations in his feet at times particularly beneath the fifth metatarsals.    Review of Systems  Constitutional: Positive for appetite change and fatigue.  HENT: Positive for sinus pressure and sneezing.   Respiratory: Positive for cough, shortness of breath and wheezing.   Gastrointestinal: Positive for diarrhea.  Musculoskeletal: Positive for arthralgias and back pain.  All other systems reviewed and are negative.      Objective:   Physical Exam  Objective: Vital signs are stable he is alert and oriented 3. Pulses are palpable. Neurologic sensorium is intact. Deep tendon reflexes are intact muscle strength is normal. Orthopedic evaluation. Although suspected full range of motion without crepitation. Cutaneous evaluation demonstrates supple well-hydrated cutis no erythema edema cellulitis drainage or odor. Reactive hyperkeratosis of fifth bilateral. 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          Assessment & Plan:  Assessment: Patient and limb secondary to toenail deformity and onychomycosis. Porokeratosis. Diabetes without complications.  Plan: Debridement of all reactive hyperkeratotic tissue debridement of nails bilateral.

## 2017-02-03 ENCOUNTER — Ambulatory Visit (INDEPENDENT_AMBULATORY_CARE_PROVIDER_SITE_OTHER): Payer: Medicare Other | Admitting: Podiatrist

## 2017-02-03 DIAGNOSIS — T148XXA Other injury of unspecified body region, initial encounter: Secondary | ICD-10-CM

## 2017-02-03 MED ORDER — CEPHALEXIN 500 MG PO CAPS: 500.0000 mg | ORAL_CAPSULE | Freq: Three times a day (TID) | ORAL | 0 refills | Status: DC

## 2017-02-03 NOTE — Patient Instructions (Signed)
Abrasion An abrasion is a cut or scrape on the outer surface of your skin. An abrasion does not extend through all of the layers of your skin. It is important to care for your abrasion properly to prevent infection. What are the causes? Most abrasions are caused by falling on or gliding across the ground or another surface. When your skin rubs on something, the outer and inner layer of skin rubs off. What are the signs or symptoms? A cut or scrape is the main symptom of this condition. The scrape may be bleeding, or it may appear red or pink. If there was an associated fall, there may be an underlying bruise. How is this diagnosed? An abrasion is diagnosed with a physical exam. How is this treated? Treatment for this condition depends on how large and deep the abrasion is. Usually, your abrasion will be cleaned with water and mild soap. This removes any dirt or debris that may be stuck. An antibiotic ointment may be applied to the abrasion to help prevent infection. A bandage (dressing) may be placed on the abrasion to keep it clean. You may also need a tetanus shot. Follow these instructions at home: Medicines   Take or apply medicines only as directed by your health care provider.  If you were prescribed an antibiotic ointment, finish all of it even if you start to feel better. Wound care   Clean the wound with mild soap and water 2-3 times per day or as directed by your health care provider. Pat your wound dry with a clean towel. Do not rub it.  There are many different ways to close and cover a wound. Follow instructions from your health care provider about:  Wound care.  Dressing changes and removal.  Check your wound every day for signs of infection. Watch for:  Redness, swelling, or pain.  Fluid, blood, or pus. General instructions    Keep the dressing dry as directed by your health care provider. Do not take baths, swim, use a hot tub, or do anything that would put your  wound underwater until your health care provider approves.  If there is swelling, raise (elevate) the injured area above the level of your heart while you are sitting or lying down.  Keep all follow-up visits as directed by your health care provider. This is important. Contact a health care provider if:  You received a tetanus shot and you have swelling, severe pain, redness, or bleeding at the injection site.  Your pain is not controlled with medicine.  You have increased redness, swelling, or pain at the site of your wound. Get help right away if:  You have a red streak going away from your wound.  You have a fever.  You have fluid, blood, or pus coming from your wound.  You notice a bad smell coming from your wound or your dressing. This information is not intended to replace advice given to you by your health care provider. Make sure you discuss any questions you have with your health care provider. Document Released: 08/11/2005 Document Revised: 07/02/2016 Document Reviewed: 10/30/2014 Elsevier Interactive Patient Education  2017 [employer].

## 2017-02-03 NOTE — Progress Notes (Signed)
Patient presents today with complaint of a sore on the left hallux that occurred when he had his nails debrided at his last visit with Dr. Milinda Pointer.  He states the machine debridement caused his skin to bleed. He has been applying triple antibiotic ointment on the toe and relates pain in the toe.  He is concerned for infection.   O:  Neurovascular status unchanged. Pulses palpated.  Left hallux nail proximal lateral nail fold has a small area of excoriation where the power burr does appear to have skimmed off a small area of skin.  It measures 37mm x 40mm in diameter and has a yellowish slough base.  Some redness of the toe itself is noted.  A:  Irritation of left hallux nail s/p debridment  P:  Recommended home care with cleansing and use of triple antibiotic.  Keflex was written for him 3 x a day for 10 days.  He is instructed to call immediately if the redness gets worse or does not subside in 3 days.  Or if he sees any drainage, pus or notices any odor to the toe. Otherwise it is a small excoriation and should go on to heal uneventfully.

## 2017-02-11 ENCOUNTER — Encounter: Payer: Self-pay | Admitting: Podiatry

## 2017-02-11 ENCOUNTER — Ambulatory Visit (INDEPENDENT_AMBULATORY_CARE_PROVIDER_SITE_OTHER): Payer: Medicare Other | Admitting: Podiatry

## 2017-02-11 VITALS — Temp 96.6°F

## 2017-02-11 DIAGNOSIS — L02612 Cutaneous abscess of left foot: Secondary | ICD-10-CM | POA: Diagnosis not present

## 2017-02-11 DIAGNOSIS — T148XXA Other injury of unspecified body region, initial encounter: Secondary | ICD-10-CM

## 2017-02-11 DIAGNOSIS — L03032 Cellulitis of left toe: Secondary | ICD-10-CM | POA: Diagnosis not present

## 2017-02-11 MED ORDER — MUPIROCIN 2 % EX OINT: 1.0000 "application " | TOPICAL_OINTMENT | Freq: Two times a day (BID) | CUTANEOUS | 0 refills | Status: DC

## 2017-02-11 MED ORDER — DOXYCYCLINE HYCLATE 100 MG PO TABS: 100.0000 mg | ORAL_TABLET | Freq: Two times a day (BID) | ORAL | 0 refills | Status: DC

## 2017-02-11 NOTE — Progress Notes (Signed)
He presents today for follow-up of his mild contusion or abrasion to the proximal nail fold of the hallux left. He's been taking Keflex and applying triple antibiotic ointment and he states that he really doesn't look any better. He states it is quite tender.  Objective: Vital signs are stable he is alert and oriented 3. Pulses are palpable. He has considerable amount of dead skin surrounding the toenail which is more than likely maceration due to too much ointment. The proximal nail fold is mildly erythematous but appears to be erythematous from a rash. He does not demonstrate any worsening from the area of silver nitrate application.  Assessment: Mild cellulitis abrasion contusion hallux.  Plan: I discontinued his Keflex and started him on doxycycline. Also suggested he start soaking in Epsom salts and warm water and apply Bactroban ointment for which we wrote a prescription. He will follow-up with me in a couple of weeks to call sooner if needed. I will be out of town next week but he is instructed to call with questions or concerns.

## 2017-02-11 NOTE — Patient Instructions (Signed)

## 2017-02-15 DIAGNOSIS — H5213 Myopia, bilateral: Secondary | ICD-10-CM | POA: Diagnosis not present

## 2017-02-15 DIAGNOSIS — H25042 Posterior subcapsular polar age-related cataract, left eye: Secondary | ICD-10-CM | POA: Diagnosis not present

## 2017-02-15 DIAGNOSIS — H52203 Unspecified astigmatism, bilateral: Secondary | ICD-10-CM | POA: Diagnosis not present

## 2017-02-15 DIAGNOSIS — E119 Type 2 diabetes mellitus without complications: Secondary | ICD-10-CM | POA: Diagnosis not present

## 2017-02-23 ENCOUNTER — Telehealth: Payer: Self-pay | Admitting: *Deleted

## 2017-02-23 MED ORDER — DOXYCYCLINE HYCLATE 100 MG PO CAPS: 100.0000 mg | ORAL_CAPSULE | Freq: Two times a day (BID) | ORAL | 0 refills | Status: DC

## 2017-02-23 NOTE — Telephone Encounter (Signed)
Pt states his left 1st toe is 85% better and he wanted to know if Dr. Milinda Pointer wanted to see him or if he would call in additional antibiotics. I spoke with pt and he said one day last week the toe had appeared to be better then got worse so he was wanting to make sure it did not have a recurring problem. I told pt to continue the soak, and I would refill the doxycycline for 7 days and transfer him to schedule for next week. Pt transferred to schedulers.

## 2017-02-28 DIAGNOSIS — I10 Essential (primary) hypertension: Secondary | ICD-10-CM | POA: Diagnosis not present

## 2017-02-28 DIAGNOSIS — E039 Hypothyroidism, unspecified: Secondary | ICD-10-CM | POA: Diagnosis not present

## 2017-02-28 DIAGNOSIS — E7800 Pure hypercholesterolemia, unspecified: Secondary | ICD-10-CM | POA: Diagnosis not present

## 2017-02-28 DIAGNOSIS — E1165 Type 2 diabetes mellitus with hyperglycemia: Secondary | ICD-10-CM | POA: Diagnosis not present

## 2017-03-03 ENCOUNTER — Encounter: Payer: Self-pay | Admitting: Podiatry

## 2017-03-03 ENCOUNTER — Ambulatory Visit (INDEPENDENT_AMBULATORY_CARE_PROVIDER_SITE_OTHER): Payer: Medicare Other | Admitting: Podiatry

## 2017-03-03 DIAGNOSIS — T148XXA Other injury of unspecified body region, initial encounter: Secondary | ICD-10-CM | POA: Diagnosis not present

## 2017-03-03 NOTE — Progress Notes (Signed)
He presents today for follow-up of abrasion to the hallux left. He states is seems to be looking better  Objective: No erythema edema cellulitis drainage or odor Has come off at this point. I see no signs of infection.  Assessment: Well-healing abrasion hallux left.  Plan: Follow up with [place] as needed.

## 2017-03-09 DIAGNOSIS — C61 Malignant neoplasm of prostate: Secondary | ICD-10-CM | POA: Diagnosis not present

## 2017-03-16 DIAGNOSIS — Z87442 Personal history of urinary calculi: Secondary | ICD-10-CM | POA: Diagnosis not present

## 2017-03-22 DIAGNOSIS — M6281 Muscle weakness (generalized): Secondary | ICD-10-CM | POA: Diagnosis not present

## 2017-03-28 DIAGNOSIS — M25552 Pain in left hip: Secondary | ICD-10-CM | POA: Diagnosis not present

## 2017-03-28 DIAGNOSIS — M62838 Other muscle spasm: Secondary | ICD-10-CM | POA: Diagnosis not present

## 2017-03-28 DIAGNOSIS — M6281 Muscle weakness (generalized): Secondary | ICD-10-CM | POA: Diagnosis not present

## 2017-03-28 DIAGNOSIS — M1612 Unilateral primary osteoarthritis, left hip: Secondary | ICD-10-CM | POA: Diagnosis not present

## 2017-03-29 ENCOUNTER — Other Ambulatory Visit: Payer: Self-pay | Admitting: Physician Assistant

## 2017-03-29 DIAGNOSIS — D492 Neoplasm of unspecified behavior of bone, soft tissue, and skin: Secondary | ICD-10-CM | POA: Diagnosis not present

## 2017-03-29 DIAGNOSIS — L570 Actinic keratosis: Secondary | ICD-10-CM | POA: Diagnosis not present

## 2017-03-29 DIAGNOSIS — D229 Melanocytic nevi, unspecified: Secondary | ICD-10-CM | POA: Diagnosis not present

## 2017-03-29 DIAGNOSIS — D0439 Carcinoma in situ of skin of other parts of face: Secondary | ICD-10-CM | POA: Diagnosis not present

## 2017-04-21 DIAGNOSIS — D0439 Carcinoma in situ of skin of other parts of face: Secondary | ICD-10-CM | POA: Diagnosis not present

## 2017-05-02 ENCOUNTER — Telehealth: Payer: Self-pay | Admitting: Podiatry

## 2017-05-02 NOTE — Telephone Encounter (Signed)
Per vm from patients wife on 6.15.18 @ 1033am they received a bill for copays when pt had to come back in after he got an infection from his nails being trimmed to short. She thinks the copays should be waved. Please call back

## 2017-06-15 ENCOUNTER — Ambulatory Visit: Payer: Medicare Other | Admitting: Podiatry

## 2017-07-14 DIAGNOSIS — E1165 Type 2 diabetes mellitus with hyperglycemia: Secondary | ICD-10-CM | POA: Diagnosis not present

## 2017-07-14 DIAGNOSIS — I10 Essential (primary) hypertension: Secondary | ICD-10-CM | POA: Diagnosis not present

## 2017-07-14 DIAGNOSIS — E039 Hypothyroidism, unspecified: Secondary | ICD-10-CM | POA: Diagnosis not present

## 2017-07-19 DIAGNOSIS — E7800 Pure hypercholesterolemia, unspecified: Secondary | ICD-10-CM | POA: Diagnosis not present

## 2017-07-19 DIAGNOSIS — I10 Essential (primary) hypertension: Secondary | ICD-10-CM | POA: Diagnosis not present

## 2017-07-19 DIAGNOSIS — E039 Hypothyroidism, unspecified: Secondary | ICD-10-CM | POA: Diagnosis not present

## 2017-07-19 DIAGNOSIS — E1165 Type 2 diabetes mellitus with hyperglycemia: Secondary | ICD-10-CM | POA: Diagnosis not present

## 2017-07-19 DIAGNOSIS — Z0000 Encounter for general adult medical examination without abnormal findings: Secondary | ICD-10-CM | POA: Diagnosis not present

## 2017-07-20 DIAGNOSIS — Z85828 Personal history of other malignant neoplasm of skin: Secondary | ICD-10-CM | POA: Diagnosis not present

## 2017-07-20 DIAGNOSIS — D229 Melanocytic nevi, unspecified: Secondary | ICD-10-CM | POA: Diagnosis not present

## 2017-07-26 DIAGNOSIS — I129 Hypertensive chronic kidney disease with stage 1 through stage 4 chronic kidney disease, or unspecified chronic kidney disease: Secondary | ICD-10-CM | POA: Diagnosis not present

## 2017-07-26 DIAGNOSIS — I2510 Atherosclerotic heart disease of native coronary artery without angina pectoris: Secondary | ICD-10-CM | POA: Diagnosis not present

## 2017-07-26 DIAGNOSIS — Z23 Encounter for immunization: Secondary | ICD-10-CM | POA: Diagnosis not present

## 2017-07-26 DIAGNOSIS — E7800 Pure hypercholesterolemia, unspecified: Secondary | ICD-10-CM | POA: Diagnosis not present

## 2017-07-26 DIAGNOSIS — N183 Chronic kidney disease, stage 3 (moderate): Secondary | ICD-10-CM | POA: Diagnosis not present

## 2017-08-01 DIAGNOSIS — N183 Chronic kidney disease, stage 3 (moderate): Secondary | ICD-10-CM | POA: Diagnosis not present

## 2017-08-01 DIAGNOSIS — I447 Left bundle-branch block, unspecified: Secondary | ICD-10-CM | POA: Diagnosis not present

## 2017-08-01 DIAGNOSIS — I2510 Atherosclerotic heart disease of native coronary artery without angina pectoris: Secondary | ICD-10-CM | POA: Diagnosis not present

## 2017-08-01 DIAGNOSIS — E1122 Type 2 diabetes mellitus with diabetic chronic kidney disease: Secondary | ICD-10-CM | POA: Diagnosis not present

## 2017-08-01 DIAGNOSIS — Z794 Long term (current) use of insulin: Secondary | ICD-10-CM | POA: Diagnosis not present

## 2017-08-16 DIAGNOSIS — M25562 Pain in left knee: Secondary | ICD-10-CM | POA: Diagnosis not present

## 2017-09-02 ENCOUNTER — Ambulatory Visit: Payer: Medicare Other | Admitting: Podiatry

## 2017-09-06 ENCOUNTER — Ambulatory Visit (INDEPENDENT_AMBULATORY_CARE_PROVIDER_SITE_OTHER): Payer: Medicare Other | Admitting: Podiatry

## 2017-09-06 DIAGNOSIS — Q828 Other specified congenital malformations of skin: Secondary | ICD-10-CM | POA: Diagnosis not present

## 2017-09-06 DIAGNOSIS — E119 Type 2 diabetes mellitus without complications: Secondary | ICD-10-CM | POA: Diagnosis not present

## 2017-09-06 NOTE — Progress Notes (Signed)
This patient presents to the office with complaint of painful callus on the outside of his left foot.  He says the callus has been present for months and is having more pain as he walks. He has provided no self treatment or sought professional help.  He is diabetic. He presents to the office for preventative foot care services.   General Appearance  Alert, conversant and in no acute stress.  Vascular  Dorsalis pedis and posterior pulses are palpable  bilaterally.  Capillary return is within normal limits  Bilaterally. Temperature is within normal limits  Bilaterally  Neurologic  Senn-Weinstein monofilament wire test within normal limits  bilaterally. Muscle power  Within normal limits bilaterally.  Nails Asymptomatic mycotic nails noted.  Orthopedic  No limitations of motion of motion feet bilaterally.  No crepitus or effusions noted.  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  Plantarflexed fifth metatarsal left foot.  Skin  normotropic skin with no porokeratosis noted bilaterally.  No signs of infections or ulcers noted.  Porokeratosis sub 5th left  Porokeratosis  Left foot.  ROV  Debride porokeratotic lesion  Including conservative and surgical treatments. Padding applied.   RTC prn.   Gardiner Barefoot DPM

## 2017-10-22 ENCOUNTER — Emergency Department
Admission: EM | Admit: 2017-10-22 | Discharge: 2017-10-22 | Disposition: A | Discharge status: Home / Self Care | Payer: Medicare Other | Attending: Emergency Medicine | Admitting: Emergency Medicine

## 2017-10-22 ENCOUNTER — Other Ambulatory Visit: Payer: Self-pay

## 2017-10-22 DIAGNOSIS — Z8546 Personal history of malignant neoplasm of prostate: Secondary | ICD-10-CM | POA: Insufficient documentation

## 2017-10-22 DIAGNOSIS — I10 Essential (primary) hypertension: Secondary | ICD-10-CM | POA: Insufficient documentation

## 2017-10-22 DIAGNOSIS — E039 Hypothyroidism, unspecified: Secondary | ICD-10-CM | POA: Diagnosis not present

## 2017-10-22 DIAGNOSIS — I252 Old myocardial infarction: Secondary | ICD-10-CM | POA: Insufficient documentation

## 2017-10-22 DIAGNOSIS — E11649 Type 2 diabetes mellitus with hypoglycemia without coma: Secondary | ICD-10-CM | POA: Insufficient documentation

## 2017-10-22 DIAGNOSIS — E162 Hypoglycemia, unspecified: Secondary | ICD-10-CM

## 2017-10-22 DIAGNOSIS — Z951 Presence of aortocoronary bypass graft: Secondary | ICD-10-CM | POA: Insufficient documentation

## 2017-10-22 DIAGNOSIS — Z794 Long term (current) use of insulin: Secondary | ICD-10-CM | POA: Diagnosis not present

## 2017-10-22 DIAGNOSIS — I2510 Atherosclerotic heart disease of native coronary artery without angina pectoris: Secondary | ICD-10-CM | POA: Diagnosis not present

## 2017-10-22 DIAGNOSIS — Z7902 Long term (current) use of antithrombotics/antiplatelets: Secondary | ICD-10-CM | POA: Diagnosis not present

## 2017-10-22 DIAGNOSIS — Z7982 Long term (current) use of aspirin: Secondary | ICD-10-CM | POA: Insufficient documentation

## 2017-10-22 DIAGNOSIS — Z79899 Other long term (current) drug therapy: Secondary | ICD-10-CM | POA: Insufficient documentation

## 2017-10-22 LAB — CBC
HCT: 52.4 % — ABNORMAL HIGH (ref 40.0–52.0)
Hemoglobin: 17.2 g/dL (ref 13.0–18.0)
MCH: 32 pg (ref 26.0–34.0)
MCHC: 32.8 g/dL (ref 32.0–36.0)
MCV: 97.5 fL (ref 80.0–100.0)
Platelets: 165 10*3/uL (ref 150–440)
RBC: 5.37 MIL/uL (ref 4.40–5.90)
RDW: 13.9 % (ref 11.5–14.5)
WBC: 6.2 10*3/uL (ref 3.8–10.6)

## 2017-10-22 LAB — BASIC METABOLIC PANEL
Anion gap: 9 (ref 5–15)
BUN: 21 mg/dL — ABNORMAL HIGH (ref 6–20)
CALCIUM: 9.2 mg/dL (ref 8.9–10.3)
CO2: 25 mmol/L (ref 22–32)
CREATININE: 1.42 mg/dL — AB (ref 0.61–1.24)
Chloride: 106 mmol/L (ref 101–111)
GFR calc non Af Amer: 48 mL/min — ABNORMAL LOW (ref >60)
GFR, EST AFRICAN AMERICAN: 56 mL/min — AB (ref >60)
Glucose, Bld: 119 mg/dL — ABNORMAL HIGH (ref 65–99)
Potassium: 4 mmol/L (ref 3.5–5.1)
SODIUM: 140 mmol/L (ref 135–145)

## 2017-10-22 LAB — GLUCOSE, CAPILLARY
GLUCOSE-CAPILLARY: 94 mg/dL (ref 65–99)
GLUCOSE-CAPILLARY: 107 mg/dL — AB (ref 65–99)

## 2017-10-22 NOTE — ED Triage Notes (Signed)
He arrives today via [facility] from a fire station in [place] that he was driving this am from his home going to [place] close to his home - pt reports having vision changes and he continued driving ending up near [employer]  in [place] his CBG was 140 last pm and he took his levemir and ate something  This am he felt "normal" so he began driving to do some errands   He stopped in [facility] and spoke with a sheriff - they called EMS so pt can be evaluated

## 2017-10-22 NOTE — ED Provider Notes (Signed)
[facility] Emergency Department Provider Note  Time seen: 2:12 PM  I have reviewed the triage vital signs and the nursing notes.   HISTORY  Chief Complaint Diabetes    HPI Jesus Young is a 71 y.o. male with a past medical history of diabetes on Lantus at night, NovoLog during the day, hyperlipidemia, hypertension, MI, presents to the emergency department for an episode of low blood sugar.  According to the patient he has been very busy today, states he was running errands and while driving he became somewhat lightheaded felt chills/diaphoretic.  States it felt just like the previous times his blood sugar has dropped.  He did not have his glucometer on him to check his blood sugar but he began drinking Powerade.  Patient states ultimately he pulled up to a police officer for assistance and was taken to the fire department and had a blood sugar reading in the mid 80s after drinking Powerade.  He is not sure what the blood sugar was when the bottomed out.  Patient states he has felt this happen 4 or 5 times previously with same symptoms each time.  On review of systems patient does state mild cough and congestion but denies any fever, chest pain or abdominal pain, vomiting or diarrhea.  Overall the patient appears very well.  He is asking to be discharged from the emergency department because he feels back to normal, fingerstick blood glucose are normal.  Patient is agreeable to stay for labs.   Past Medical History:  Diagnosis Date  . Arthritis    back and knees  . Bronchitis    hx of 2015  . Cancer [facility])    prostate  . Cataract    immature on right  . Chronic back pain   . Constipation    takes Colace at bedtime  . Coronary artery disease    takes Plavix daily but is on hold for surgery  . Diabetes mellitus without complication ([place])    takes Levemir and Humalog daily  . Dizziness   . Fall    hx of with injury to left shoulder   . GERD  (gastroesophageal reflux disease)    takes Protonix daily and Zantac  . H/O urinary frequency   . Hemorrhoids   . History of bladder infections   . History of kidney stones   . Hyperlipidemia    takes Zocor every evening  . Hypertension    takes Metoprolol daily  . Hypothyroidism    takes Synthroid daily  . Myocardial infarction [facility])    many yrs before 2002  . Night muscle spasms    takes Robaxin 4 x day   . Nocturia     Patient Active Problem List   Diagnosis Date Noted  . Cough 03/16/2016  . Headache 03/16/2016  . Nasal congestion 03/16/2016  . Non-seasonal allergic rhinitis 03/16/2016  . Post-nasal drip 03/16/2016  . Renal calculus 06/23/2015  . Renal calculus, left     Past Surgical History:  Procedure Laterality Date  . CARDIAC CATHETERIZATION  05/17/02  . COLONOSCOPY    . CORONARY ANGIOPLASTY     x 1   . CORONARY ARTERY BYPASS GRAFT  2002   x 5  . eyelid surgery     . LUMBAR LAMINECTOMY/DECOMPRESSION MICRODISCECTOMY Bilateral 07/18/2013   Procedure: Bilateral Lumbar four-five Lumbar Laminotomy/Microdiskectomy;  Surgeon: Hosie Spangle, MD;  Location: [facility];  Service: Neurosurgery;  Laterality: Bilateral;  Bilateral Lumbar four-five Lumbar Laminotomy/Microdiskectomy  .  NEPHROLITHOTOMY Left 06/23/2015   Procedure: NEPHROLITHOTOMY PERCUTANEOUS;  Surgeon: [name], MD;  Location: [facility];  Service: Urology;  Laterality: Left;  . PROSTATECTOMY  10/2009  . skin spots removed from back -precancerous    . TONSILLECTOMY      Prior to Admission medications   Medication Sig Start Date End Date Taking? Authorizing Provider  acetaminophen (TYLENOL) 650 MG CR tablet Take 1,300 mg by mouth every 8 (eight) hours as needed for pain.    [provider]  aspirin EC 81 MG tablet Take 81 mg by mouth at bedtime.     [provider]  B-D ULTRAFINE III SHORT PEN 31G X 8 MM MISC USE UTD 01/18/17   [provider]  clonazePAM (KLONOPIN) 0.5 MG tablet  Take 0.5 mg by mouth 2 (two) times daily. 11/18/16   [provider]  clopidogrel (PLAVIX) 75 MG tablet Take 75 mg by mouth at bedtime. 05/26/15   [provider]  docusate sodium (COLACE) 100 MG capsule Take 100-300 mg by mouth at bedtime as needed for mild constipation (constipation).     [provider]  doxycycline (VIBRAMYCIN) 100 MG capsule Take 1 capsule (100 mg total) by mouth 2 (two) times daily. 02/23/17   Hyatt, Max T, DPM  insulin detemir (LEVEMIR) 100 UNIT/ML injection Inject 0.16 mLs (16 Units total) into the skin 2 (two) times daily. Patient taking differently: Inject 18 Units into the skin 2 (two) times daily.  10/30/15   Ward, Delice Bison, DO  insulin lispro (HUMALOG) 100 UNIT/ML injection Inject 0.05-0.09 mLs (5-9 Units total) into the skin 2 (two) times daily. 5 units at lunch, and 9 units after dinner. 10/30/15   Ward, Delice Bison, DO  levothyroxine (SYNTHROID, LEVOTHROID) 100 MCG tablet Take 100 mcg by mouth daily. 11/16/16   [provider]  metoprolol (LOPRESSOR) 50 MG tablet Take 50 mg by mouth 2 (two) times daily.     [provider]  mineral oil-hydrophilic petrolatum (AQUAPHOR) ointment Apply 1 application topically as needed for dry skin.    [provider]  mupirocin ointment (BACTROBAN) 2 % Apply 1 application topically 2 (two) times daily. 02/11/17   Hyatt, Max T, DPM  ONE TOUCH ULTRA TEST test strip  01/21/17   [provider]  pantoprazole (PROTONIX) 40 MG tablet Take 40 mg by mouth daily. 11/16/16   [provider]  ranitidine (ZANTAC) 300 MG tablet Take 300 mg by mouth at bedtime.    [provider]  simvastatin (ZOCOR) 40 MG tablet Take 40 mg by mouth every evening.    [provider]    Allergies  Allergen Reactions  . Tetanus Toxoids Hives    No family history on file.  Social History Social History   Tobacco Use  . Smoking status: Never Smoker  . Smokeless tobacco: Never Used   Substance Use Topics  . Alcohol use: No  . Drug use: No    Review of Systems Constitutional: Negative for fever Cardiovascular: Negative for chest pain. Respiratory: Negative for shortness of breath.  Occasional cough which he states is been ongoing for approximately 2 months believed to be due to allergies. Gastrointestinal: Negative for abdominal pain, vomiting and diarrhea. Genitourinary: Negative for dysuria. Neurological: Negative for headache All other ROS negative  ____________________________________________   PHYSICAL EXAM:  VITAL SIGNS: ED Triage Vitals  Enc Vitals Group     BP 10/22/17 1150 (!) 170/74     Pulse Rate 10/22/17 1150 67  Resp 10/22/17 1150 18     Temp --      Temp src --      SpO2 10/22/17 1150 99 %     Weight 10/22/17 1150 185 lb (83.9 kg)     Height 10/22/17 1150 6\' 3"  (1.905 m)     Head Circumference --      Peak Flow --      Pain Score 10/22/17 1212 0     Pain Loc --      Pain Edu? --      Excl. in [place]? --    Constitutional: Alert and oriented. Well appearing and in no distress. Eyes: Normal exam ENT   Head: Normocephalic and atraumatic.   Mouth/Throat: Mucous membranes are moist. Cardiovascular: Normal rate, regular rhythm. No murmur Respiratory: Normal respiratory effort without tachypnea nor retractions. Breath sounds are clear  Gastrointestinal: Soft and nontender. No distention.  Musculoskeletal: Nontender with normal range of motion in all extremities. Neurologic:  Normal speech and language. No gross focal neurologic deficits Skin:  Skin is warm, dry and intact.  Psychiatric: Mood and affect are normal.   ____________________________________________   INITIAL IMPRESSION / ASSESSMENT AND PLAN / ED COURSE  Pertinent labs & imaging results that were available during my care of the patient were reviewed by me and considered in my medical decision making (see chart for details).  Patient presents to the emergency  department for an episode of hypoglycemia per patient.  Differential would include hypoglycemia, infectious etiology, metabolic abnormality.  Patient does admit to anxiety as well.  Patient believes his blood sugar likely dropped as the symptoms he was experiencing of generalized fatigue weakness with some memory deficits he states are typical of his hypoglycemic episodes.  Patient states he began drinking Gatorade and after approximately 20 minutes he went to the fire station ultimately and had his blood sugar checked which was in the 80s.  He believes it was likely a lot lower than that earlier.  Patient states he feels back to normal and wishes to be discharged home.  After discussion with the patient he is agreeable to stay for basic labs.  Overall the patient appears well with a normal physical examination.  Patient's labs are largely within normal limits.  Blood glucose on the chemistry is 119.  Mild renal insufficiency is unchanged from prior records.  Overall the patient appears well.  We will discharge home with PCP follow-up.  Patient agreeable to plan.  ____________________________________________   FINAL CLINICAL IMPRESSION(S) / ED DIAGNOSES  Hypoglycemia    Harvest Dark, MD 10/22/17 1504

## 2017-10-22 NOTE — ED Triage Notes (Signed)
First Nurse Note:  Arrives via [facility].  Patient was picked up at the fire station.  Patient C/O liable glucose readings.  Also c/o cold chills.  Endocrinologist in [place].  Patient takes levimir BID.  Patient is AAOx3.  Skin warm and dry. NAD

## 2017-10-27 ENCOUNTER — Other Ambulatory Visit: Payer: Self-pay

## 2017-10-27 NOTE — Patient Outreach (Signed)
[facility]) Care Management  10/27/2017  [name] 1946-10-14 [id number]   1st Telephone call to patient for ED Utilization screening.  Spouse answered the phone and stated that the patient was not at home. HIPAA complaint voice message left with contact information.    Plan:  RN Health Coach Will make an outreach attempt to the patient within three business days.  [name] RN, BSN, [place] Direct Dial:  [phone] Fax: [phone]

## 2017-11-01 ENCOUNTER — Other Ambulatory Visit: Payer: Self-pay

## 2017-11-01 NOTE — Patient Outreach (Signed)
[facility]) Care Management  11/01/2017  Jesus Young [birth year] [id number]   Telephone call to patient for ED Utilization screening. HIPAA verified. The patient stated that he did not have long to talk.  He has been contacted in the past and feels that he does not need any assistance. He stated that the only area that he needed help in would be with his insulin.  RN HC explained to the patient about our [facility] Pharmacist and what they could offer.  He still feels that they could not help him.  RN [facility] will send the patient a Pamphlet and ask the patient to speak with his physician about [place].  If he feels in the future that we can help give [place] a call.   Plan:  RN Health Coach will close the case at this time. RN [facility] will notify the [facility] assistants of case status.  [name] RN, BSN, [place] Direct Dial:  [phone] Fax: [phone]

## 2017-11-21 DIAGNOSIS — E039 Hypothyroidism, unspecified: Secondary | ICD-10-CM | POA: Diagnosis not present

## 2017-11-21 DIAGNOSIS — E1165 Type 2 diabetes mellitus with hyperglycemia: Secondary | ICD-10-CM | POA: Diagnosis not present

## 2017-11-29 DIAGNOSIS — M26609 Unspecified temporomandibular joint disorder, unspecified side: Secondary | ICD-10-CM | POA: Diagnosis not present

## 2017-12-02 DIAGNOSIS — I129 Hypertensive chronic kidney disease with stage 1 through stage 4 chronic kidney disease, or unspecified chronic kidney disease: Secondary | ICD-10-CM | POA: Diagnosis not present

## 2017-12-02 DIAGNOSIS — E1165 Type 2 diabetes mellitus with hyperglycemia: Secondary | ICD-10-CM | POA: Diagnosis not present

## 2017-12-02 DIAGNOSIS — E039 Hypothyroidism, unspecified: Secondary | ICD-10-CM | POA: Diagnosis not present

## 2018-01-24 DIAGNOSIS — E7800 Pure hypercholesterolemia, unspecified: Secondary | ICD-10-CM | POA: Diagnosis not present

## 2018-01-24 DIAGNOSIS — E1165 Type 2 diabetes mellitus with hyperglycemia: Secondary | ICD-10-CM | POA: Diagnosis not present

## 2018-01-24 DIAGNOSIS — I2510 Atherosclerotic heart disease of native coronary artery without angina pectoris: Secondary | ICD-10-CM | POA: Diagnosis not present

## 2018-01-31 DIAGNOSIS — I129 Hypertensive chronic kidney disease with stage 1 through stage 4 chronic kidney disease, or unspecified chronic kidney disease: Secondary | ICD-10-CM | POA: Diagnosis not present

## 2018-01-31 DIAGNOSIS — E1165 Type 2 diabetes mellitus with hyperglycemia: Secondary | ICD-10-CM | POA: Diagnosis not present

## 2018-01-31 DIAGNOSIS — N183 Chronic kidney disease, stage 3 (moderate): Secondary | ICD-10-CM | POA: Diagnosis not present

## 2018-01-31 DIAGNOSIS — I2510 Atherosclerotic heart disease of native coronary artery without angina pectoris: Secondary | ICD-10-CM | POA: Diagnosis not present

## 2018-02-15 DIAGNOSIS — H25042 Posterior subcapsular polar age-related cataract, left eye: Secondary | ICD-10-CM | POA: Diagnosis not present

## 2018-02-15 DIAGNOSIS — E119 Type 2 diabetes mellitus without complications: Secondary | ICD-10-CM | POA: Diagnosis not present

## 2018-02-15 DIAGNOSIS — H2513 Age-related nuclear cataract, bilateral: Secondary | ICD-10-CM | POA: Diagnosis not present

## 2018-02-15 DIAGNOSIS — H524 Presbyopia: Secondary | ICD-10-CM | POA: Diagnosis not present

## 2018-04-25 DIAGNOSIS — E1165 Type 2 diabetes mellitus with hyperglycemia: Secondary | ICD-10-CM | POA: Diagnosis not present

## 2018-04-25 DIAGNOSIS — I2510 Atherosclerotic heart disease of native coronary artery without angina pectoris: Secondary | ICD-10-CM | POA: Diagnosis not present

## 2018-04-25 DIAGNOSIS — N183 Chronic kidney disease, stage 3 (moderate): Secondary | ICD-10-CM | POA: Diagnosis not present

## 2018-05-02 DIAGNOSIS — I2510 Atherosclerotic heart disease of native coronary artery without angina pectoris: Secondary | ICD-10-CM | POA: Diagnosis not present

## 2018-05-02 DIAGNOSIS — I129 Hypertensive chronic kidney disease with stage 1 through stage 4 chronic kidney disease, or unspecified chronic kidney disease: Secondary | ICD-10-CM | POA: Diagnosis not present

## 2018-05-02 DIAGNOSIS — E1165 Type 2 diabetes mellitus with hyperglycemia: Secondary | ICD-10-CM | POA: Diagnosis not present

## 2018-05-02 DIAGNOSIS — E7800 Pure hypercholesterolemia, unspecified: Secondary | ICD-10-CM | POA: Diagnosis not present

## 2018-05-05 DIAGNOSIS — C61 Malignant neoplasm of prostate: Secondary | ICD-10-CM | POA: Diagnosis not present

## 2018-06-01 DIAGNOSIS — E1165 Type 2 diabetes mellitus with hyperglycemia: Secondary | ICD-10-CM | POA: Diagnosis not present

## 2018-08-02 DIAGNOSIS — Z794 Long term (current) use of insulin: Secondary | ICD-10-CM | POA: Diagnosis not present

## 2018-08-02 DIAGNOSIS — I447 Left bundle-branch block, unspecified: Secondary | ICD-10-CM | POA: Diagnosis not present

## 2018-08-02 DIAGNOSIS — E1122 Type 2 diabetes mellitus with diabetic chronic kidney disease: Secondary | ICD-10-CM | POA: Diagnosis not present

## 2018-08-02 DIAGNOSIS — N183 Chronic kidney disease, stage 3 (moderate): Secondary | ICD-10-CM | POA: Diagnosis not present

## 2018-08-02 DIAGNOSIS — I2510 Atherosclerotic heart disease of native coronary artery without angina pectoris: Secondary | ICD-10-CM | POA: Diagnosis not present

## 2018-08-07 DIAGNOSIS — M79602 Pain in left arm: Secondary | ICD-10-CM | POA: Diagnosis not present

## 2018-08-07 DIAGNOSIS — I10 Essential (primary) hypertension: Secondary | ICD-10-CM | POA: Diagnosis not present

## 2018-08-07 DIAGNOSIS — Z951 Presence of aortocoronary bypass graft: Secondary | ICD-10-CM | POA: Diagnosis not present

## 2018-08-07 DIAGNOSIS — I2510 Atherosclerotic heart disease of native coronary artery without angina pectoris: Secondary | ICD-10-CM | POA: Diagnosis not present

## 2018-08-08 DIAGNOSIS — Z0000 Encounter for general adult medical examination without abnormal findings: Secondary | ICD-10-CM | POA: Diagnosis not present

## 2018-08-08 DIAGNOSIS — I129 Hypertensive chronic kidney disease with stage 1 through stage 4 chronic kidney disease, or unspecified chronic kidney disease: Secondary | ICD-10-CM | POA: Diagnosis not present

## 2018-08-08 DIAGNOSIS — E7800 Pure hypercholesterolemia, unspecified: Secondary | ICD-10-CM | POA: Diagnosis not present

## 2018-08-08 DIAGNOSIS — I2510 Atherosclerotic heart disease of native coronary artery without angina pectoris: Secondary | ICD-10-CM | POA: Diagnosis not present

## 2018-08-08 DIAGNOSIS — E1165 Type 2 diabetes mellitus with hyperglycemia: Secondary | ICD-10-CM | POA: Diagnosis not present

## 2018-08-15 DIAGNOSIS — E1121 Type 2 diabetes mellitus with diabetic nephropathy: Secondary | ICD-10-CM | POA: Diagnosis not present

## 2018-08-15 DIAGNOSIS — Z0000 Encounter for general adult medical examination without abnormal findings: Secondary | ICD-10-CM | POA: Diagnosis not present

## 2018-08-15 DIAGNOSIS — I2510 Atherosclerotic heart disease of native coronary artery without angina pectoris: Secondary | ICD-10-CM | POA: Diagnosis not present

## 2018-08-18 DIAGNOSIS — M79602 Pain in left arm: Secondary | ICD-10-CM | POA: Diagnosis not present

## 2018-08-18 DIAGNOSIS — I10 Essential (primary) hypertension: Secondary | ICD-10-CM | POA: Diagnosis not present

## 2018-08-28 DIAGNOSIS — N183 Chronic kidney disease, stage 3 (moderate): Secondary | ICD-10-CM | POA: Diagnosis not present

## 2018-08-28 DIAGNOSIS — E1122 Type 2 diabetes mellitus with diabetic chronic kidney disease: Secondary | ICD-10-CM | POA: Diagnosis not present

## 2018-08-28 DIAGNOSIS — Z794 Long term (current) use of insulin: Secondary | ICD-10-CM | POA: Diagnosis not present

## 2018-08-28 DIAGNOSIS — I2510 Atherosclerotic heart disease of native coronary artery without angina pectoris: Secondary | ICD-10-CM | POA: Diagnosis not present

## 2018-09-26 DIAGNOSIS — I10 Essential (primary) hypertension: Secondary | ICD-10-CM | POA: Diagnosis not present

## 2018-09-26 DIAGNOSIS — M79602 Pain in left arm: Secondary | ICD-10-CM | POA: Diagnosis not present

## 2018-09-26 DIAGNOSIS — E1122 Type 2 diabetes mellitus with diabetic chronic kidney disease: Secondary | ICD-10-CM | POA: Diagnosis not present

## 2018-09-26 DIAGNOSIS — I2510 Atherosclerotic heart disease of native coronary artery without angina pectoris: Secondary | ICD-10-CM | POA: Diagnosis not present

## 2018-10-25 DIAGNOSIS — I2510 Atherosclerotic heart disease of native coronary artery without angina pectoris: Secondary | ICD-10-CM | POA: Diagnosis not present

## 2018-10-25 DIAGNOSIS — M79602 Pain in left arm: Secondary | ICD-10-CM | POA: Diagnosis not present

## 2018-10-25 DIAGNOSIS — I10 Essential (primary) hypertension: Secondary | ICD-10-CM | POA: Diagnosis not present

## 2018-10-25 DIAGNOSIS — Z0189 Encounter for other specified special examinations: Secondary | ICD-10-CM | POA: Diagnosis not present

## 2018-11-17 DIAGNOSIS — I10 Essential (primary) hypertension: Secondary | ICD-10-CM | POA: Diagnosis not present

## 2018-11-17 DIAGNOSIS — Z0189 Encounter for other specified special examinations: Secondary | ICD-10-CM | POA: Diagnosis not present

## 2018-11-17 DIAGNOSIS — I2510 Atherosclerotic heart disease of native coronary artery without angina pectoris: Secondary | ICD-10-CM | POA: Diagnosis not present

## 2018-11-17 DIAGNOSIS — M79602 Pain in left arm: Secondary | ICD-10-CM | POA: Diagnosis not present

## 2018-11-24 DIAGNOSIS — I2510 Atherosclerotic heart disease of native coronary artery without angina pectoris: Secondary | ICD-10-CM | POA: Diagnosis not present

## 2018-12-04 DIAGNOSIS — I129 Hypertensive chronic kidney disease with stage 1 through stage 4 chronic kidney disease, or unspecified chronic kidney disease: Secondary | ICD-10-CM | POA: Diagnosis not present

## 2018-12-04 DIAGNOSIS — E1165 Type 2 diabetes mellitus with hyperglycemia: Secondary | ICD-10-CM | POA: Diagnosis not present

## 2018-12-04 DIAGNOSIS — E782 Mixed hyperlipidemia: Secondary | ICD-10-CM | POA: Diagnosis not present

## 2018-12-04 DIAGNOSIS — I25118 Atherosclerotic heart disease of native coronary artery with other forms of angina pectoris: Secondary | ICD-10-CM | POA: Diagnosis present

## 2018-12-04 DIAGNOSIS — I2510 Atherosclerotic heart disease of native coronary artery without angina pectoris: Secondary | ICD-10-CM | POA: Diagnosis not present

## 2018-12-04 NOTE — H&P (Addendum)
OFFICE VISIT NOTES COPIED TO EPIC FOR DOCUMENTATION  . History of Present Illness Jesus Maine FNP-C; 11/19/2018 11:24 AM) Patient words: 3 week f/u for angina; Last OV 10/25/2018.  The patient is a 73 year old male who presents for a Follow-up for Coronary artery disease. [name] is a fairly active Caucasian male with history of known coronary artery disease. He has had CABG in 2012, left bundle branch block that was noted 2014. Recently had complaints of left arm pain with exertion, underwent lexiscan nuclear stress test on 08/07/2018 considered high risk study due to LVEF; however, on echocardiogram had normal LVEF. No significant changes were noted compared to the 2014.  Patient is here on 4 week office visit and follow up for angina and left arm pain. Due to continued to symptoms, Imdur was increased. He has continued to have symptoms despite increased dose of Imdur. He now has occasional chest tightness, mostly at rest. No nitroglycerin use. Continues to feel as though his activity tolerance is decreased. Tolerating medications well. Leg edema has been stable.   Problem List/Past Medical Jesus Young; 11/17/2018 11:00 AM) Acid reflux (K21.9)  Anxiety (F41.9)  Bursitis of left hip, unspecified bursa (M70.72)  Bursitis of right hip, unspecified bursa (M70.71)  Controlled type 2 diabetes mellitus with stage 3 chronic kidney disease, with long-term current use of insulin (E11.22)  Hypercholesteremia (E78.00)  LBBB (left bundle branch block) (I44.7)  EKG 08/02/2018: Normal sinus rhythm at rate of 63 bpm, left atrial abnormality. Left branch block and no further analysis. No significant change from EKG 07/31/2017. Arthritis (M19.90)  spine, knees, left shoulder Laboratory examination (Z01.89)  08/08/2018: Hct 49.2, CBC otherwise normal. Creatinine 1.56, eGFR 44, potassium 5.4, CMP otherwise normal. Cholesterol 119, triglycerides 141, HDL 34, LDL 57. 05/12/2015: Serum  glucose 164, BUN 30, Creatinine 2.26, eGFR 29, potassium 5.2, CBC normal, total cholesterol 128, triglycerides 84, HDL 33, LDL 78, TSH 0.8 02/20/2015: HbA1c 7.0% 03/19/2014: Hemoglobin 17.5/hematocrit 55.2, elevated. Platelet count is normal. Weight count is normal. CMP reveals BUN 30, serum creatinine 1.66, eGFR 42 mL. Potassium 5.8. Total cholesterol 134, triglycerides 97, HDL 39, LDL 76. 01/02/2013: BUN 19, serum creatinine 1.49. Benign essential hypertension (I10)  Echocardiogram 08/19/2018: Left ventricle cavity is normal in size. Abnormal septal wall motion due to left bundle branch block. Doppler evidence of grade I (impaired) diastolic dysfunction, normal LAP. Low normal LV systolic function. Visual EF is 50-55%. Calculated EF 58%. Structurally normal tricuspid valve with trace regurgitation. Structurally normal pulmonic valve with trace regurgitation. Compared to the study done on 01/22/2013, no significant change. Left arm pain (M79.602)  Atherosclerosis of native coronary artery of native heart without angina pectoris (I25.10) [04/26/2001]: Coronary artery bypass grafting 04/27/11 x 5 (LIMA to LAD, s SVG to Ramus Intermediate, left radial artery graft to OM1, sequential SVG to PDA and RCA. Coronary angiogram and PTCA 05/17/2002 Proximal LAD and D1 (unprotected) stent 3.0x13 mm Cypher. Other grafts patent. History of PTCA (Z98.61) [05/09/2002]: Coronary angiogram and PTCA 05/17/2002 Proximal LAD and D1 (unprotected) stent 3.0x13 mm Cypher. Other grafts patent History of coronary artery bypass graft (Z95.1) [04/27/2011]: Cardiac cath total occlusion of the LAD, a 90% stenosis of the circumflex, an 80% stenosis of right coronary with ejection fraction of 45%. Recommended Coronary artery bypass grafting 04/26/01 x 5 (LIMA to LAD, s SVG to Ramus Intermediate, left radial artery graft to OM1, sequential SVG to PDA and RCA.  Lexiscan myoview stress test 08/07/2018: 1. Lexiscan stress test was  performed.  Exercise capacity was not assessed. No stress symptoms reported. Peak effect blood pressure was 164/88 mmHg. The resting and stress electrocardiogram demonstrated normal sinus rhythm and LBBB. Stress EKG is non diagnostic for ischemia as it is a pharmacologic stress. 2. The overall quality of the study is good. Left ventricular cavity is noted to be normal on the rest and stress studies. Gated SPECT images reveal normal myocardial thickening and wall motion. The left ventricular ejection fraction was calculated at 32%, although visually appears near normal. Recommend correlation with echocardiogram. Mid inferior perfusion defect seen worse on rest images without corresponding wall motion abnormality likely represents tissue attenuation artifact. 3. High risk study due to low LVEF. Clinical and echocardiographic correlation recommended.  Allergies Jesus Young; 12/03/2018 11:00 AM) Lisinopril *CHEMICALS*  Headache. clonazePAM *CHEMICALS*  Headache.  Family History Jesus Young; 12-03-2018 11:00 AM) Mother  Deceased. at age 55 CHF. Father  Deceased. at age 88 from a MI; h/o CABG at age 42. Sister 1  living, younger. No known heart conditions.  Social History Jesus Young; Dec 03, 2018 11:00 AM) Current tobacco use  Never smoker. Non Drinker/No Alcohol Use  Marital status  Married. Number of Children  0. Living Situation  Lives with spouse.  Past Surgical History Jesus Young; 12/03/2018 11:00 AM) Percutaneous transluminal cornoary angioplasty (PTCA) with insertion of stent ([id number]) [05/17/2002]: Date: 05/17/2002. Coronary artery bypass grafting 04/26/01 x 5 (LIMA to LAD, s SVG to Ramus Intermediate, left radial artery graft to OM1, sequential SVG to PDA adn RCA. Cardiac cath total occlusion of the LAD, a 90% stenosis of the circumflex, an 80% stenosis of right coronary with ejection fraction of 45%.  Coronary angiogram and PTCA 05/17/2002 Proximal LAD and D1 (unprotected) stent 3.0x13 mm  Cypher. Other grafts patent Prostate Surgery - Removal [2010]: due to cancer. back surgery on L5 [2014]: Surgery to repair eye lids [05/2015]: Left percutaneous nephrostolithotomy [06/23/2015]:  Medication History Jesus Young; December 03, 2018 11:07 AM) Isosorbide Mononitrate ER (120MG Tablet ER 24HR, 1 (one) Tablet Oral two times daily, Taken starting 10/27/2018) Active. (increased dose) amLODIPine Besylate (5MG Tablet, 1 (one) Tablet Tablet Oral daily, Taken starting 09/27/2018) Active. (decreased due to leg swelling) Rosuvastatin Calcium (20MG Tablet, 1 (one) Tablet Tablet Oral daily, Taken starting 09/27/2018) Active. (d/c simvastatin) Nitrostat (0.4MG Tab Sublingual, 1 (one) Tab Sub Tab Sublingua Sublingual every 5 minutes as needed for chest pain., Taken starting 09/26/2018) Active. Levothyroxine Sodium (100MCG Capsule, 1 Oral every morning, on an empty stomach) Active. Clopidogrel Bisulfate (75MG Tablet, 1 Oral daily) Active. Aspirin (81MG Tablet, 1 Oral daily) Active. Ranitidine HCl (300MG Tablet, 1 Oral at bedtime) Active. Stool Softener (100MG Capsule, Oral as needed) Active. Gas Relief Ultra Strength (180MG Capsule, Oral as needed) Active. Metoprolol Tartrate (50MG Tablet, 1 Oral two times daily) Active. Levemir FlexTouch (100UNIT/ML Soln Pen-inj, 18 U morning 18U bedtime Subcutaneous two times daily) Active: DM. HumaLOG Mix 75/25 KwikPen ((75-25) 100UNIT/ML Susp Pen-inj, 5 un @ lunch 9 u @dinner  Subcutaneous) Active. (can go up 1-2 units as needed) Pantoprazole Sodium (40MG Tablet DR, 1 Oral daily) Active. Tylenol 8 Hour (650MG Tablet ER, Oral daily) Active. (at least 4 a day) Probiotic Advanced (Oral as needed) Active. busPIRone HCl (7.5MG Tablet, 1 Oral two times daily) Active. Medications Reconciled (verbally no list present)  Diagnostic Studies History Cheri Kearns; 12/03/18 11:01 AM) Nuclear stress test  Lexiscan myoview stress test 08/07/2018: 1.  Lexiscan stress test was performed. Exercise capacity was not assessed. No stress symptoms reported. Peak effect blood  pressure was 164/88 mmHg. The resting and stress electrocardiogram demonstrated normal sinus rhythm and LBBB. Stress EKG is non diagnostic for ischemia as it is a pharmacologic stress. 2. The overall quality of the study is good. Left ventricular cavity is noted to be normal on the rest and stress studies. Gated SPECT images reveal normal myocardial thickening and wall motion. The left ventricular ejection fraction was calculated at 32%, although visually appears near normal. Recommend correlation with echocardiogram. Mid inferior perfusion defect seen worse on rest images without corresponding wall motion abnormality likely represents tissue attenuation artifact. 3. High risk study due to low LVEF. Clinical and echocardiographic correlation recomme Colonoscopy [2017]: Normal. Removed 1 benign polyp Echocardiogram  08/19/2018: Left ventricle cavity is normal in size. Abnormal septal wall motion due to left bundle branch block. Doppler evidence of grade I (impaired) diastolic dysfunction, normal LAP. Low normal LV systolic function. Visual EF is 50-55%. Calculated EF 58%. Structurally normal tricuspid valve with trace regurgitation. Structurally normal pulmonic valve with trace regurgitation. Compared to the study done on 01/22/2013, no significant change Heart Cath [2003]: stent (doesn't know placement)  Other Problems Jesus Young; 11/17/2018 11:00 AM) Coronary atherosclerosis of artery bypass graft (I25.810) [04/26/2001]: Date: 04/26/2001. Coronary artery bypass grafting 04/26/01 x 5 (LIMA to LAD, s SVG to Ramus Intermediate, left radial artery graft to OM1, sequential SVG to PDA adn RCA. Cardiac cath total occlusion of the LAD, a 90% stenosis of the circumflex, an 80% stenosis of right coronary with ejection fraction of 45%.  Review of Systems Jesus Maine FNP-C; 11/17/2018  11:36 AM) General Present- Feeling well and Tiredness. Not Present- Unable to Sleep Lying Flat. HEENT Not Present- Blurred Vision. Respiratory Present- Decreased Exercise Tolerance. Not Present- Bloody sputum, Difficulty Breathing on Exertion and Wakes up from Sleep Wheezing or Short of Breath. Cardiovascular Present- Chest Pain (occasional at rest) and Edema (stable). Not Present- Claudications, Hypertension and Paroxysmal Nocturnal Dyspnea. Gastrointestinal Not Present- Black, Tarry Stool, Bloody Stool and Heartburn. Musculoskeletal Present- Backache, Leg Cramps (occasionally at night) and Muscle Pain (left arm pain with exertion). Not Present- Joint Pain. Neurological Not Present- Focal Neurological Symptoms. Psychiatric Present- Anxiety (Since he retired feels easily distracted and anxious and also loses temper). Not Present- Personality Changes and Suicidal Ideation. Hematology Not Present- Blood Clots, Easy Bruising and Nose Bleed. All other systems negative  Vitals Jesus Maine FNP-C; 11/17/2018 11:56 AM) 11/17/2018 11:56 AM BP: 158/84 (Sitting, Left Arm, Standard)  11/17/2018 11:43 AM Weight: 182.25 lb Height: 75in Body Surface Area: 2.11 m Body Mass Index: 22.78 kg/m  Pulse: 69 (Regular)  P.OX: 97% (Room air) BP: 168/81 (Sitting, Left Arm, Standard)  11/17/2018 11:02 AM Weight: 182.25 lb Height: 75in Body Surface Area: 2.11 m Body Mass Index: 22.78 kg/m  Pulse: 58 (Regular)  P.OX: 97% (Room air) BP: 141/81 (Sitting, Left Arm, Standard)  Physical Exam Jesus Maine FNP-C; 11/17/2018 11:39 AM) General Mental Status-Alert. General Appearance-Cooperative, Appears stated age, Not in acute distress. Orientation-Oriented X3. Build & Nutrition-Well built.  Head and Neck Thyroid Gland Characteristics - no palpable nodules, no palpable enlargement.  Chest and Lung Exam Chest and lung exam reveals -quiet, even and easy respiratory effort with no  use of accessory muscles and on auscultation, normal breath sounds, no adventitious sounds.  Cardiovascular Cardiovascular examination reveals -normal heart sounds, regular rate and rhythm with no murmurs(Paradoxcal splitting of S2 due to LBBB), carotid auscultation reveals no bruits, femoral artery auscultation bilaterally reveals normal pulses, no bruits, no thrills  and normal pedal pulses bilaterally. Inspection Jugular vein - Right - No Distention.  Abdomen Palpation/Percussion Normal exam - Non Tender and No hepatosplenomegaly. Auscultation Normal exam - Bowel sounds normal.  Peripheral Vascular Lower Extremity Palpation - Edema - Bilateral - Trace edema.  Neurologic Motor-Grossly intact without any focal deficits.  Musculoskeletal Global Assessment Left Lower Extremity - normal range of motion without pain. Right Lower Extremity - normal range of motion without pain.  Assessment & Plan Jesus Maine FNP-C; 11/19/2018 11:25 AM) Atherosclerosis of native coronary artery of native heart without angina pectoris (I25.10) Story: Coronary artery bypass grafting 04/27/11 x 5 (LIMA to LAD, s SVG to Ramus Intermediate, left radial artery graft to OM1, sequential SVG to PDA and RCA.  Coronary angiogram and PTCA 05/17/2002 Proximal LAD and D1 (unprotected) stent 3.0x13 mm Cypher. Other grafts patent.  History of coronary artery bypass graft (Z95.1) 04/26/01 x 5 (LIMA to LAD, s SVG to Ramus Intermediate, left radial artery graft to OM1, sequential SVG to PDA and RCA. Lexiscan myoview stress test 08/07/2018:  1. Lexiscan stress test was performed. Exercise capacity was not assessed. No stress symptoms reported. Peak effect blood pressure was 164/88 mmHg. The resting and stress electrocardiogram demonstrated normal sinus rhythm and LBBB.  Stress EKG is non diagnostic for ischemia as it is a pharmacologic stress.  2. The overall quality of the study is good.  Left ventricular cavity is  noted to be normal on the rest and stress studies. Gated SPECT images reveal normal myocardial thickening and wall motion.   The left ventricular ejection fraction was calculated at 32%, although visually appears near normal. Recommend correlation with echocardiogram.  Mid inferior perfusion defect seen worse on rest images without corresponding wall motion abnormality likely represents tissue attenuation artifact.  3. High risk study due to low LVEF. Clinical and echocardiographic correlation recommended.  Benign essential hypertension (I10) Story: Echocardiogram 08/19/2018: Left ventricle cavity is normal in size. Abnormal septal wall motion due to left bundle branch block. Doppler evidence of grade I (impaired) diastolic dysfunction, normal LAP. Low normal LV systolic function. Visual EF is 50-55%. Calculated EF 58%. Structurally normal tricuspid valve with trace regurgitation. Structurally normal pulmonic valve with trace regurgitation. Compared to the study done on 01/22/2013, no significant change. Left arm pain (M79.602) Laboratory examination (M54.65) Story: 11/27/2018: Creatinine 1.5, EGFR 46/53, potassium 4.0, glucose 224, BMP otherwise normal.  CBC normal.  08/08/2018: Hct 49.2, CBC otherwise normal. Creatinine 1.56, eGFR 44, potassium 5.4, CMP otherwise normal. Cholesterol 119, triglycerides 141, HDL 34, LDL 57.  05/12/2015: Serum glucose 164, BUN 30, Creatinine 2.26, eGFR 29, potassium 5.2, CBC normal, total cholesterol 128, triglycerides 84, HDL 33, LDL 78, TSH 0.8  02/20/2015: HbA1c 7.0%  03/19/2014: Hemoglobin 17.5/hematocrit 55.2, elevated. Platelet count is normal. Weight count is normal. CMP reveals BUN 30, serum creatinine 1.66, eGFR 42 mL. Potassium 5.8. Total cholesterol 134, triglycerides 97, HDL 39, LDL 76.  01/02/2013: BUN 19, serum creatinine 1.49.  Note:-  Recommendations:  Jesus Young is a fairly active Caucasian male with history of known coronary artery  disease. He has had CABG in 2012, and has underlying left bundle branch block that was noted sometime in February 2014. He has had a low risk stress test and echo at that time which revealed no ischemia and normal LVEF.  Despite aggressive medical therapy, he continues to have symptoms of angina. Have recommended further evaluation with coronary angiogram. We'll have this scheduled in the next few weeks. Schedule  for cardiac catheterization, and possible angioplasty. We discussed regarding risks, benefits, alternatives to this including stress testing, CTA and continued medical therapy. Patient wants to proceed. Understands <1-2% risk of death, stroke, MI, urgent CABG, bleeding, infection, renal failure but not limited to these. He will continue with current medications. Will see him back after the procedure for follow up.  Blood pressure is elevated today, but generally well controlled. He will continue to monitor at home.  CC Dr. Merrilee Seashore.    Signed by Jesus Maine, FNP-C (11/19/2018 11:25 AM)

## 2018-12-05 ENCOUNTER — Observation Stay (HOSPITAL_COMMUNITY)
Admission: RE | Admit: 2018-12-05 | Discharge: 2018-12-06 | Disposition: A | Discharge status: Home / Self Care | Payer: Medicare Other | Attending: Cardiology | Admitting: Cardiology

## 2018-12-05 ENCOUNTER — Other Ambulatory Visit: Payer: Self-pay

## 2018-12-05 ENCOUNTER — Encounter (HOSPITAL_COMMUNITY)
Admission: RE | Disposition: A | Discharge status: Home / Self Care | Payer: Self-pay | Source: Home / Self Care | Attending: Cardiology

## 2018-12-05 DIAGNOSIS — I447 Left bundle-branch block, unspecified: Secondary | ICD-10-CM | POA: Diagnosis not present

## 2018-12-05 DIAGNOSIS — Z955 Presence of coronary angioplasty implant and graft: Secondary | ICD-10-CM | POA: Insufficient documentation

## 2018-12-05 DIAGNOSIS — Z79890 Hormone replacement therapy (postmenopausal): Secondary | ICD-10-CM | POA: Insufficient documentation

## 2018-12-05 DIAGNOSIS — Z794 Long term (current) use of insulin: Secondary | ICD-10-CM | POA: Diagnosis not present

## 2018-12-05 DIAGNOSIS — I209 Angina pectoris, unspecified: Secondary | ICD-10-CM | POA: Diagnosis not present

## 2018-12-05 DIAGNOSIS — I25119 Atherosclerotic heart disease of native coronary artery with unspecified angina pectoris: Secondary | ICD-10-CM | POA: Diagnosis not present

## 2018-12-05 DIAGNOSIS — Z7982 Long term (current) use of aspirin: Secondary | ICD-10-CM | POA: Insufficient documentation

## 2018-12-05 DIAGNOSIS — K219 Gastro-esophageal reflux disease without esophagitis: Secondary | ICD-10-CM | POA: Diagnosis not present

## 2018-12-05 DIAGNOSIS — E7800 Pure hypercholesterolemia, unspecified: Secondary | ICD-10-CM | POA: Insufficient documentation

## 2018-12-05 DIAGNOSIS — I25118 Atherosclerotic heart disease of native coronary artery with other forms of angina pectoris: Secondary | ICD-10-CM | POA: Diagnosis present

## 2018-12-05 DIAGNOSIS — Z8249 Family history of ischemic heart disease and other diseases of the circulatory system: Secondary | ICD-10-CM | POA: Diagnosis not present

## 2018-12-05 DIAGNOSIS — R9439 Abnormal result of other cardiovascular function study: Secondary | ICD-10-CM | POA: Diagnosis not present

## 2018-12-05 DIAGNOSIS — I129 Hypertensive chronic kidney disease with stage 1 through stage 4 chronic kidney disease, or unspecified chronic kidney disease: Secondary | ICD-10-CM | POA: Diagnosis not present

## 2018-12-05 DIAGNOSIS — R931 Abnormal findings on diagnostic imaging of heart and coronary circulation: Secondary | ICD-10-CM | POA: Diagnosis not present

## 2018-12-05 DIAGNOSIS — Z79899 Other long term (current) drug therapy: Secondary | ICD-10-CM | POA: Insufficient documentation

## 2018-12-05 DIAGNOSIS — E1165 Type 2 diabetes mellitus with hyperglycemia: Secondary | ICD-10-CM

## 2018-12-05 DIAGNOSIS — Z888 Allergy status to other drugs, medicaments and biological substances status: Secondary | ICD-10-CM | POA: Diagnosis not present

## 2018-12-05 DIAGNOSIS — Z951 Presence of aortocoronary bypass graft: Secondary | ICD-10-CM | POA: Insufficient documentation

## 2018-12-05 DIAGNOSIS — I10 Essential (primary) hypertension: Secondary | ICD-10-CM

## 2018-12-05 DIAGNOSIS — E1122 Type 2 diabetes mellitus with diabetic chronic kidney disease: Secondary | ICD-10-CM | POA: Diagnosis not present

## 2018-12-05 DIAGNOSIS — IMO0001 Reserved for inherently not codable concepts without codable children: Secondary | ICD-10-CM

## 2018-12-05 DIAGNOSIS — N183 Chronic kidney disease, stage 3 (moderate): Secondary | ICD-10-CM | POA: Insufficient documentation

## 2018-12-05 DIAGNOSIS — I2510 Atherosclerotic heart disease of native coronary artery without angina pectoris: Secondary | ICD-10-CM | POA: Diagnosis present

## 2018-12-05 DIAGNOSIS — M1990 Unspecified osteoarthritis, unspecified site: Secondary | ICD-10-CM | POA: Insufficient documentation

## 2018-12-05 HISTORY — PX: LEFT HEART CATH AND CORS/GRAFTS ANGIOGRAPHY: CATH118250

## 2018-12-05 LAB — GLUCOSE, CAPILLARY
GLUCOSE-CAPILLARY: 204 mg/dL — AB (ref 70–99)
Glucose-Capillary: 216 mg/dL — ABNORMAL HIGH (ref 70–99)
Glucose-Capillary: 190 mg/dL — ABNORMAL HIGH (ref 70–99)
Glucose-Capillary: 243 mg/dL — ABNORMAL HIGH (ref 70–99)

## 2018-12-05 LAB — HEMOGLOBIN AND HEMATOCRIT, BLOOD
HCT: 44.9 % (ref 39.0–52.0)
Hemoglobin: 14.3 g/dL (ref 13.0–17.0)

## 2018-12-05 SURGERY — LEFT HEART CATH AND CORS/GRAFTS ANGIOGRAPHY
Anesthesia: LOCAL

## 2018-12-05 MED ORDER — INSULIN ASPART 100 UNIT/ML ~~LOC~~ SOLN
12.0000 [IU] | Freq: Two times a day (BID) | SUBCUTANEOUS | Status: DC
  Administered 2018-12-05: 20:00:00 12 [IU] via SUBCUTANEOUS
  Administered 2018-12-06: 5 [IU] via SUBCUTANEOUS

## 2018-12-05 MED ORDER — PANTOPRAZOLE SODIUM 40 MG PO TBEC
40.0000 mg | DELAYED_RELEASE_TABLET | Freq: Every day | ORAL | Status: DC
  Administered 2018-12-05: 40 mg via ORAL
  Filled 2018-12-05: qty 1

## 2018-12-05 MED ORDER — SODIUM CHLORIDE 0.9 % IV SOLN: 250.0000 mL | INTRAVENOUS | Status: DC | PRN

## 2018-12-05 MED ORDER — MIDAZOLAM HCL 2 MG/2ML IJ SOLN
INTRAMUSCULAR | Status: DC | PRN
  Administered 2018-12-05: 2 mg via INTRAVENOUS

## 2018-12-05 MED ORDER — ACETAMINOPHEN 325 MG PO TABS
650.0000 mg | ORAL_TABLET | ORAL | Status: DC | PRN
  Administered 2018-12-05: 650 mg via ORAL
  Filled 2018-12-05: qty 2

## 2018-12-05 MED ORDER — SODIUM CHLORIDE 0.9% FLUSH: 3.0000 mL | INTRAVENOUS | Status: DC | PRN

## 2018-12-05 MED ORDER — FENTANYL CITRATE (PF) 100 MCG/2ML IJ SOLN
50.0000 ug | Freq: Once | INTRAMUSCULAR | Status: AC
  Administered 2018-12-05: 50 ug via INTRAVENOUS

## 2018-12-05 MED ORDER — LIDOCAINE HCL (PF) 1 % IJ SOLN
INTRAMUSCULAR | Status: DC | PRN
  Administered 2018-12-05: 2 mL

## 2018-12-05 MED ORDER — INSULIN ASPART 100 UNIT/ML ~~LOC~~ SOLN: 8.0000 [IU] | Freq: Two times a day (BID) | SUBCUTANEOUS | Status: DC

## 2018-12-05 MED ORDER — LEVOTHYROXINE SODIUM 100 MCG PO TABS
100.0000 ug | ORAL_TABLET | ORAL | Status: DC
  Administered 2018-12-06: 07:00:00 100 ug via ORAL
  Filled 2018-12-05: qty 1

## 2018-12-05 MED ORDER — METOPROLOL TARTRATE 25 MG PO TABS
50.0000 mg | ORAL_TABLET | Freq: Two times a day (BID) | ORAL | Status: DC
  Administered 2018-12-05 – 2018-12-06 (×2): 50 mg via ORAL
  Filled 2018-12-05 (×2): qty 2

## 2018-12-05 MED ORDER — LIDOCAINE HCL (PF) 1 % IJ SOLN
INTRAMUSCULAR | Status: AC
  Filled 2018-12-05: qty 30

## 2018-12-05 MED ORDER — SODIUM CHLORIDE 0.9% FLUSH: 3.0000 mL | Freq: Two times a day (BID) | INTRAVENOUS | Status: DC

## 2018-12-05 MED ORDER — ASPIRIN 81 MG PO CHEW
81.0000 mg | CHEWABLE_TABLET | ORAL | Status: AC
  Administered 2018-12-05: 81 mg via ORAL
  Filled 2018-12-05: qty 1

## 2018-12-05 MED ORDER — ROSUVASTATIN CALCIUM 20 MG PO TABS
20.0000 mg | ORAL_TABLET | Freq: Every evening | ORAL | Status: DC
  Administered 2018-12-05: 20 mg via ORAL
  Filled 2018-12-05: qty 1

## 2018-12-05 MED ORDER — CLOPIDOGREL BISULFATE 75 MG PO TABS: 75.0000 mg | ORAL_TABLET | Freq: Every day | ORAL | Status: DC

## 2018-12-05 MED ORDER — SODIUM CHLORIDE 0.9 % WEIGHT BASED INFUSION
3.0000 mL/kg/h | INTRAVENOUS | Status: DC
  Administered 2018-12-05: 3 mL/kg/h via INTRAVENOUS

## 2018-12-05 MED ORDER — ONDANSETRON HCL 4 MG/2ML IJ SOLN: 4.0000 mg | Freq: Four times a day (QID) | INTRAMUSCULAR | Status: DC | PRN

## 2018-12-05 MED ORDER — SODIUM CHLORIDE 0.9% FLUSH
3.0000 mL | Freq: Two times a day (BID) | INTRAVENOUS | Status: DC
  Administered 2018-12-05: 3 mL via INTRAVENOUS

## 2018-12-05 MED ORDER — SODIUM CHLORIDE 0.9 % WEIGHT BASED INFUSION: 1.0000 mL/kg/h | INTRAVENOUS | Status: AC

## 2018-12-05 MED ORDER — AMLODIPINE BESYLATE 5 MG PO TABS
5.0000 mg | ORAL_TABLET | Freq: Every evening | ORAL | Status: DC
  Administered 2018-12-05: 20:00:00 5 mg via ORAL
  Filled 2018-12-05: qty 1

## 2018-12-05 MED ORDER — ASPIRIN EC 81 MG PO TBEC: 81.0000 mg | DELAYED_RELEASE_TABLET | Freq: Every day | ORAL | Status: DC

## 2018-12-05 MED ORDER — BUSPIRONE HCL 15 MG PO TABS
7.5000 mg | ORAL_TABLET | Freq: Two times a day (BID) | ORAL | Status: DC
  Administered 2018-12-05: 22:00:00 7.5 mg via ORAL
  Filled 2018-12-05 (×3): qty 1

## 2018-12-05 MED ORDER — MIDAZOLAM HCL 2 MG/2ML IJ SOLN
INTRAMUSCULAR | Status: AC
  Filled 2018-12-05: qty 2

## 2018-12-05 MED ORDER — HEPARIN (PORCINE) IN NACL 1000-0.9 UT/500ML-% IV SOLN
INTRAVENOUS | Status: DC | PRN
  Administered 2018-12-05: 500 mL

## 2018-12-05 MED ORDER — NITROGLYCERIN 1 MG/10 ML FOR IR/CATH LAB
INTRA_ARTERIAL | Status: AC
  Filled 2018-12-05: qty 10

## 2018-12-05 MED ORDER — FAMOTIDINE 20 MG PO TABS
20.0000 mg | ORAL_TABLET | Freq: Two times a day (BID) | ORAL | Status: DC
  Administered 2018-12-05: 22:00:00 20 mg via ORAL
  Filled 2018-12-05: qty 1

## 2018-12-05 MED ORDER — FENTANYL CITRATE (PF) 100 MCG/2ML IJ SOLN
INTRAMUSCULAR | Status: AC
  Filled 2018-12-05: qty 2

## 2018-12-05 MED ORDER — NITROGLYCERIN 1 MG/10 ML FOR IR/CATH LAB
INTRA_ARTERIAL | Status: DC | PRN
  Administered 2018-12-05: 200 ug via INTRA_ARTERIAL

## 2018-12-05 MED ORDER — IOHEXOL 350 MG/ML SOLN
INTRAVENOUS | Status: DC | PRN
  Administered 2018-12-05: 90 mL via INTRAVENOUS

## 2018-12-05 MED ORDER — CLOPIDOGREL BISULFATE 75 MG PO TABS
75.0000 mg | ORAL_TABLET | Freq: Once | ORAL | Status: AC
  Administered 2018-12-05: 75 mg via ORAL
  Filled 2018-12-05: qty 1

## 2018-12-05 MED ORDER — FENTANYL CITRATE (PF) 100 MCG/2ML IJ SOLN
INTRAMUSCULAR | Status: AC
  Administered 2018-12-05: 50 ug via INTRAVENOUS
  Filled 2018-12-05: qty 2

## 2018-12-05 MED ORDER — INSULIN DETEMIR 100 UNIT/ML ~~LOC~~ SOLN
18.0000 [IU] | Freq: Two times a day (BID) | SUBCUTANEOUS | Status: DC
  Administered 2018-12-06: 18 [IU] via SUBCUTANEOUS
  Filled 2018-12-05 (×2): qty 0.18

## 2018-12-05 MED ORDER — FENTANYL CITRATE (PF) 100 MCG/2ML IJ SOLN
INTRAMUSCULAR | Status: DC | PRN
  Administered 2018-12-05: 50 ug via INTRAVENOUS

## 2018-12-05 MED ORDER — FENTANYL CITRATE (PF) 100 MCG/2ML IJ SOLN
50.0000 ug | INTRAMUSCULAR | Status: DC | PRN
  Administered 2018-12-05 – 2018-12-06 (×2): 50 ug via INTRAVENOUS
  Filled 2018-12-05 (×2): qty 2

## 2018-12-05 MED ORDER — HEPARIN (PORCINE) IN NACL 1000-0.9 UT/500ML-% IV SOLN
INTRAVENOUS | Status: AC
  Filled 2018-12-05: qty 500

## 2018-12-05 MED ORDER — DOCUSATE SODIUM 100 MG PO CAPS
400.0000 mg | ORAL_CAPSULE | Freq: Every day | ORAL | Status: DC
  Administered 2018-12-05: 22:00:00 400 mg via ORAL
  Filled 2018-12-05: qty 4

## 2018-12-05 MED ORDER — SODIUM CHLORIDE 0.9 % WEIGHT BASED INFUSION: 1.0000 mL/kg/h | INTRAVENOUS | Status: DC

## 2018-12-05 MED ORDER — ISOSORBIDE MONONITRATE ER 60 MG PO TB24
120.0000 mg | ORAL_TABLET | Freq: Every evening | ORAL | Status: DC
  Administered 2018-12-05: 20:00:00 120 mg via ORAL
  Filled 2018-12-05: qty 2

## 2018-12-05 SURGICAL SUPPLY — 12 items
CATH INFINITI 5 FR IM (CATHETERS) ×2 IMPLANT
CATH INFINITI 5FR JL4 (CATHETERS) ×2 IMPLANT
CATH INFINITI 5FR MPB2 (CATHETERS) ×2 IMPLANT
KIT HEART LEFT (KITS) ×2 IMPLANT
KIT MICROPUNCTURE NIT STIFF (SHEATH) ×2 IMPLANT
PACK CARDIAC CATHETERIZATION (CUSTOM PROCEDURE TRAY) ×2 IMPLANT
SHEATH PINNACLE 5F 10CM (SHEATH) ×2 IMPLANT
SHEATH PROBE COVER 6X72 (BAG) ×2 IMPLANT
TRANSDUCER W/STOPCOCK (MISCELLANEOUS) ×2 IMPLANT
TUBING CIL FLEX 10 FLL-RA (TUBING) ×2 IMPLANT
WIRE EMERALD 3MM-J .035X150CM (WIRE) ×2 IMPLANT
WIRE EMERALD 3MM-J .035X260CM (WIRE) ×2 IMPLANT

## 2018-12-05 NOTE — Interval H&P Note (Signed)
History and Physical Interval Note:  12/05/2018 12:33 PM  Jesus Young  has presented today for surgery, with the diagnosis of UA  The various methods of treatment have been discussed with the patient and family. After consideration of risks, benefits and other options for treatment, the patient has consented to  Procedure(s): LEFT HEART CATH AND CORS/GRAFTS ANGIOGRAPHY (N/A) as a surgical intervention .  The patient's history has been reviewed, patient examined, no change in status, stable for surgery.  I have reviewed the patient's chart and labs.  Questions were answered to the patient's satisfaction.    2016/2017 Appropriate Use Criteria for Coronary Revascularization Clinical Presentation: Diabetes Mellitus? Symptom Status? S/P CABG? Antianginal Therapy (# of long-acting drugs)? Results of Non-invasive testing? FFR/iFR results in all diseased vessels? Patient undergoing renal transplant? Patient undergoing percutaneous valve procedure (TAVR, MitraClip, Others)? Symptom Status:  Ischemic Symptoms  Non-invasive Testing:  High risk  If no or indeterminate stress test, FFR/iFR results in all diseased vessels:  N/A  Diabetes Mellitus:  Yes  S/P CABG:  Yes  Antianginal therapy (number of long-acting drugs):  1  Patient undergoing renal transplant:  No  Patient undergoing percutaneous valve procedure:  No     LIMA-LAD patent and without significant stenoses PCI CABG  Stenosis supplying 1 territory (bypass graft or native artery) other than anterior A (7); Indication 66 M (4); Indication 31   Stenoses supplying 2 territories (bypass graft or native artery, either 2 separate vessels or sequential graft supplying 2 territories) not including anterior territory A (7); Indication 39 M (5); Indication 34   newline LIMA-LAD not patent  Stenosis supplying 1 territory (bypass graft or native artery)-anterior (LAD) territory A (7); Indication 52 M (5); Indication 36   Stenoses supplying 2  territories (bypass graft or native artery, either 2 separate vessels or sequential graft supplying 2 territories)-LAD plus other territory A (7); Indication 39 A (7); Indication 39   Stenoses supplying 3 territories (bypass graft or native arteries, separate vessels, sequential grafts, or combination thereof)-LAD plus 2 other territories A (7); Indication 41 A (7); Indication [place]

## 2018-12-05 NOTE — Progress Notes (Signed)
Gave tylenol for c/o right groin pain; right groin stable, no bleeding or hematoma

## 2018-12-05 NOTE — Progress Notes (Addendum)
Client called me into room and c/o right groin pain all of a sudden and c/o feeling sick; color pale; Mickel Baas and Neeley in to check groin; notified Dr Einar Gip

## 2018-12-05 NOTE — Progress Notes (Signed)
Additional manual pressure held for 15 min. Groin a level 1.  Site redressed with gauze and tegaderm and a pressure dressing applied after Dr Einar Gip assessed the area and talk to pt.

## 2018-12-05 NOTE — Interval H&P Note (Signed)
History and Physical Interval Note:  12/05/2018 12:09 PM  Hebert Soho  has presented today for surgery, with the diagnosis of UA  The various methods of treatment have been discussed with the patient and family. After consideration of risks, benefits and other options for treatment, the patient has consented to  Procedure(s): LEFT HEART CATH AND CORONARY ANGIOGRAPHY (N/A) and possible angioplasty  as a surgical intervention .  The patient's history has been reviewed, patient examined, no change in status, stable for surgery.  I have reviewed the patient's chart and labs.  Questions were answered to the patient's satisfaction.   Symptom Status: Ischemic Symptoms Non-invasive Testing: High risk If no or indeterminate stress test, FFR/iFR results in all diseased vessels: N/A Diabetes Mellitus: Yes S/P CABG: Yes Antianginal therapy (number of long-acting drugs): >=2 Patient undergoing renal transplant: No Patient undergoing percutaneous valve procedure: No  LIMA-LAD patent and without significant stenoses Stenosis supplying 1 territory (bypass graft or native artery) other than anterior  PCI: A (8);  Indication 31  CABG: M (5);  Indication 31 Stenoses supplying 2 territories (bypass graft or native artery, either 2 separate vessels or sequential graft supplying 2 territories) not including anterior territory  PCI: A (8);  Indication 34  CABG: M (6);  Indication 34  LIMA-LAD not patent Stenosis supplying 1 territory (bypass graft or native artery)-anterior (LAD) territory  PCI: A (8);  Indication 36  CABG: M (6);  Indication 36 Stenoses supplying 2 territories (bypass graft or native artery, either 2 separate vessels or sequential graft supplying 2 territories)-LAD plus other territory  PCI: A (8);  Indication 39  CABG: A (8);  Indication 39 Stenoses supplying 3 territories (bypass graft or native arteries, separate vessels, sequential grafts, or combination thereof)-LAD plus 2 other  territories  PCI: A (8);  Indication 41  CABG: A (8);  Indication 41  Notes:  A indicates appropriate. M indicates may be appropriate. R indicates rarely appropriate. Number in parentheses is median score for that indication. Reclassify indicates number of functionally diseased vessels should be decreased given negative FFR/iFR. Re-evaluate the scenario interpreting any FFR/iFR negative vessel as being not significantly stenosed.  Journal of the [employer] of Cardiology Mar 2017, 23391; DOI: 10.1016/j.jacc.2017.02.001 [email or web address].full-text.pdf This App  2018 by the Society for Cardiovascular Angiography and Interventions   Adrian Prows

## 2018-12-05 NOTE — Progress Notes (Signed)
I evaluated the patient and he has a small hematoma and the access was in the CFA using US guided access and due to pain, will admit for overnight observation. No back pain or large hematoma to suggest RPH. Wife present and discussed as well.  Appreciate the cath team Mickel Baas and Olive Bass coming to check on him as well as Melanie at the short stay.

## 2018-12-06 ENCOUNTER — Encounter (HOSPITAL_COMMUNITY): Payer: Self-pay | Admitting: Cardiology

## 2018-12-06 DIAGNOSIS — K219 Gastro-esophageal reflux disease without esophagitis: Secondary | ICD-10-CM | POA: Diagnosis not present

## 2018-12-06 DIAGNOSIS — E1122 Type 2 diabetes mellitus with diabetic chronic kidney disease: Secondary | ICD-10-CM | POA: Diagnosis not present

## 2018-12-06 DIAGNOSIS — I129 Hypertensive chronic kidney disease with stage 1 through stage 4 chronic kidney disease, or unspecified chronic kidney disease: Secondary | ICD-10-CM | POA: Diagnosis not present

## 2018-12-06 DIAGNOSIS — Z794 Long term (current) use of insulin: Secondary | ICD-10-CM | POA: Diagnosis not present

## 2018-12-06 DIAGNOSIS — Z7982 Long term (current) use of aspirin: Secondary | ICD-10-CM | POA: Diagnosis not present

## 2018-12-06 DIAGNOSIS — Z955 Presence of coronary angioplasty implant and graft: Secondary | ICD-10-CM | POA: Diagnosis not present

## 2018-12-06 DIAGNOSIS — IMO0001 Reserved for inherently not codable concepts without codable children: Secondary | ICD-10-CM

## 2018-12-06 DIAGNOSIS — Z79899 Other long term (current) drug therapy: Secondary | ICD-10-CM | POA: Diagnosis not present

## 2018-12-06 DIAGNOSIS — M1990 Unspecified osteoarthritis, unspecified site: Secondary | ICD-10-CM | POA: Diagnosis not present

## 2018-12-06 DIAGNOSIS — I25119 Atherosclerotic heart disease of native coronary artery with unspecified angina pectoris: Secondary | ICD-10-CM | POA: Diagnosis not present

## 2018-12-06 DIAGNOSIS — E7800 Pure hypercholesterolemia, unspecified: Secondary | ICD-10-CM | POA: Diagnosis not present

## 2018-12-06 DIAGNOSIS — R9439 Abnormal result of other cardiovascular function study: Secondary | ICD-10-CM | POA: Diagnosis not present

## 2018-12-06 DIAGNOSIS — Z888 Allergy status to other drugs, medicaments and biological substances status: Secondary | ICD-10-CM | POA: Diagnosis not present

## 2018-12-06 DIAGNOSIS — R931 Abnormal findings on diagnostic imaging of heart and coronary circulation: Secondary | ICD-10-CM | POA: Diagnosis not present

## 2018-12-06 DIAGNOSIS — Z951 Presence of aortocoronary bypass graft: Secondary | ICD-10-CM | POA: Diagnosis not present

## 2018-12-06 DIAGNOSIS — N183 Chronic kidney disease, stage 3 (moderate): Secondary | ICD-10-CM | POA: Diagnosis not present

## 2018-12-06 DIAGNOSIS — I209 Angina pectoris, unspecified: Secondary | ICD-10-CM | POA: Diagnosis not present

## 2018-12-06 DIAGNOSIS — E1165 Type 2 diabetes mellitus with hyperglycemia: Secondary | ICD-10-CM

## 2018-12-06 DIAGNOSIS — Z8249 Family history of ischemic heart disease and other diseases of the circulatory system: Secondary | ICD-10-CM | POA: Diagnosis not present

## 2018-12-06 DIAGNOSIS — I447 Left bundle-branch block, unspecified: Secondary | ICD-10-CM | POA: Diagnosis not present

## 2018-12-06 DIAGNOSIS — I10 Essential (primary) hypertension: Secondary | ICD-10-CM

## 2018-12-06 LAB — HEMOGLOBIN AND HEMATOCRIT, BLOOD
HCT: 43.7 % (ref 39.0–52.0)
Hemoglobin: 14.2 g/dL (ref 13.0–17.0)

## 2018-12-06 LAB — GLUCOSE, CAPILLARY: Glucose-Capillary: 191 mg/dL — ABNORMAL HIGH (ref 70–99)

## 2018-12-06 MED ORDER — SENNA 8.6 MG PO TABS
2.0000 | ORAL_TABLET | ORAL | Status: AC
  Administered 2018-12-06: 17.2 mg via ORAL
  Filled 2018-12-06: qty 2

## 2018-12-06 NOTE — Discharge Summary (Signed)
Physician Discharge Summary  [name] MRN: [id number] DOB/AGE: 73-Jul-1947 73 y.o.  Admit date: 12/05/2018 Discharge date: 12/06/2018  Admission Diagnoses: CAD, chest pain Abnormal stress test  Discharge Diagnoses:  Principal Problem:   CAD (coronary artery disease), native coronary artery Active Problems:   Angina pectoris (HCC)   CAD (coronary artery disease)   Type 2 DM with CKD and hypertension ([place])   Essential hypertension   Discharged Condition: good  Hospital Course:  73 y/o Caucasian male with CAD s/p CABG, (LIMA-LAD, SVG-RI, left radial artery-OM1, sequential SVG-PDA/RCA), prior PCI, hypertension, DM, underwent coronary and bypass graft angiography on 12/05/2018 that showed diffuse CAD, as detailed below. Medical management was recommended. Post cath, patient had right groin hematoma, which has since resolved.  I expect him to have pain for next several days in right groin. Avoid driving, straining, for next several days. Patient is going to request Dr. Ashby Young his PCP to reschedule his appt with him on 12/07/2018 to another day.   Consults: None  Significant Diagnostic Studies: labs:  Results for Jesus Young (MRN [id number]) as of 12/06/2018 08:34  Ref. Range 12/05/2018 19:44 12/06/2018 04:40  Hemoglobin Latest Ref Range: 13.0 - 17.0 g/dL 14.3 14.2  HCT Latest Ref Range: 39.0 - 52.0 % 44.9 43.7    Treatments:  Medical management for CAD  Discharge Exam: Blood pressure 131/82, pulse 75, temperature 97.8 F (36.6 C), temperature source Oral, resp. rate 20, height 6\' 3"  (1.905 m), weight 85.3 kg, SpO2 97 %. Physical Exam  Constitutional: He is oriented to person, place, and time. He appears well-developed and well-nourished. No distress.  Eyes: Pupils are equal, round, and reactive to light. Conjunctivae are normal.  Neck: No JVD present.  Cardiovascular: Normal rate, regular rhythm and normal heart sounds.  No murmur heard. Right  groin soft, resolving hematoma.  Pulmonary/Chest: Effort normal and breath sounds normal.  Sternotomy scar  Abdominal: Soft. Bowel sounds are normal. There is no abdominal tenderness.  Lymphadenopathy:    He has no cervical adenopathy.  Neurological: He is alert and oriented to person, place, and time.  Skin: Skin is warm and dry.  Psychiatric: He has a normal mood and affect.  Nursing note and vitals reviewed.    Disposition: Discharge disposition: 01-Home or Self Care       Discharge Instructions    Diet - low sodium heart healthy   Complete by:  As directed    Increase activity slowly   Complete by:  As directed      Allergies as of 12/06/2018      Reactions   Tetanus Toxoids Hives      Medication List    TAKE these medications   acetaminophen 650 MG CR tablet Commonly known as:  TYLENOL Take 1,300 mg by mouth every 8 (eight) hours as needed for pain.   amLODipine 5 MG tablet Commonly known as:  NORVASC Take 5 mg by mouth every evening.   aspirin EC 81 MG tablet Take 81 mg by mouth at bedtime.   B-D ULTRAFINE III SHORT PEN 31G X 8 MM Misc Generic drug:  Insulin Pen Needle USE UTD   busPIRone 7.5 MG tablet Commonly known as:  BUSPAR Take 7.5 mg by mouth 2 (two) times daily.   clopidogrel 75 MG tablet Commonly known as:  PLAVIX Take 75 mg by mouth at bedtime.   docusate sodium 100 MG capsule Commonly known as:  COLACE Take 400 mg by mouth at bedtime.  insulin detemir 100 UNIT/ML injection Commonly known as:  LEVEMIR Inject 0.16 mLs (16 Units total) into the skin 2 (two) times daily. What changed:  how much to take   insulin lispro 100 UNIT/ML injection Commonly known as:  HUMALOG Inject 0.05-0.09 mLs (5-9 Units total) into the skin 2 (two) times daily. 5 units at lunch, and 9 units after dinner. What changed:    how much to take  additional instructions   isosorbide mononitrate 120 MG 24 hr tablet Commonly known as:  IMDUR Take 120 mg by  mouth every evening.   levothyroxine 100 MCG tablet Commonly known as:  SYNTHROID, LEVOTHROID Take 100 mcg by mouth See admin instructions. Take 1 tablet (100 mcg) by mouth daily in the morning, except on Sundays   metoprolol tartrate 50 MG tablet Commonly known as:  LOPRESSOR Take 50 mg by mouth 2 (two) times daily.   ONE TOUCH ULTRA TEST test strip Generic drug:  glucose blood   pantoprazole 40 MG tablet Commonly known as:  PROTONIX Take 40 mg by mouth at bedtime.   ranitidine 300 MG tablet Commonly known as:  ZANTAC Take 300 mg by mouth at bedtime.   rosuvastatin 20 MG tablet Commonly known as:  CRESTOR Take 20 mg by mouth every evening.      Follow-up Information    Adrian Prows, MD.   Specialty:  Cardiology Why:  Please keep previous appointment Contact information: [place] [address] [phone]           Signed: Nigel Mormon 12/06/2018, 8:31 AM  Nigel Mormon, MD [facility] Pager: [phone] Office: [phone] If no answer Cell [phone]

## 2018-12-14 DIAGNOSIS — Z0189 Encounter for other specified special examinations: Secondary | ICD-10-CM | POA: Diagnosis not present

## 2018-12-14 DIAGNOSIS — I10 Essential (primary) hypertension: Secondary | ICD-10-CM | POA: Diagnosis not present

## 2018-12-14 DIAGNOSIS — R1031 Right lower quadrant pain: Secondary | ICD-10-CM | POA: Diagnosis not present

## 2018-12-14 DIAGNOSIS — I25119 Atherosclerotic heart disease of native coronary artery with unspecified angina pectoris: Secondary | ICD-10-CM | POA: Diagnosis not present

## 2018-12-19 DIAGNOSIS — E1121 Type 2 diabetes mellitus with diabetic nephropathy: Secondary | ICD-10-CM | POA: Diagnosis not present

## 2018-12-19 DIAGNOSIS — I2510 Atherosclerotic heart disease of native coronary artery without angina pectoris: Secondary | ICD-10-CM | POA: Diagnosis not present

## 2018-12-19 DIAGNOSIS — E1165 Type 2 diabetes mellitus with hyperglycemia: Secondary | ICD-10-CM | POA: Diagnosis not present

## 2018-12-19 DIAGNOSIS — L89609 Pressure ulcer of unspecified heel, unspecified stage: Secondary | ICD-10-CM | POA: Diagnosis not present

## 2018-12-19 DIAGNOSIS — E782 Mixed hyperlipidemia: Secondary | ICD-10-CM | POA: Diagnosis not present

## 2018-12-20 DIAGNOSIS — R6889 Other general symptoms and signs: Secondary | ICD-10-CM | POA: Diagnosis not present

## 2018-12-26 ENCOUNTER — Other Ambulatory Visit: Payer: Self-pay | Admitting: Cardiology

## 2019-01-09 DIAGNOSIS — E1165 Type 2 diabetes mellitus with hyperglycemia: Secondary | ICD-10-CM | POA: Diagnosis not present

## 2019-01-09 DIAGNOSIS — E1121 Type 2 diabetes mellitus with diabetic nephropathy: Secondary | ICD-10-CM | POA: Diagnosis not present

## 2019-01-10 ENCOUNTER — Encounter: Payer: Self-pay | Admitting: Cardiology

## 2019-01-10 ENCOUNTER — Ambulatory Visit: Payer: Medicare Other | Admitting: Cardiology

## 2019-01-10 VITALS — BP 136/76 | HR 62 | Ht 75.0 in | Wt 180.5 lb

## 2019-01-10 DIAGNOSIS — B356 Tinea cruris: Secondary | ICD-10-CM

## 2019-01-10 DIAGNOSIS — Z951 Presence of aortocoronary bypass graft: Secondary | ICD-10-CM | POA: Insufficient documentation

## 2019-01-10 DIAGNOSIS — I447 Left bundle-branch block, unspecified: Secondary | ICD-10-CM | POA: Insufficient documentation

## 2019-01-10 DIAGNOSIS — E1122 Type 2 diabetes mellitus with diabetic chronic kidney disease: Secondary | ICD-10-CM | POA: Diagnosis not present

## 2019-01-10 DIAGNOSIS — I25118 Atherosclerotic heart disease of native coronary artery with other forms of angina pectoris: Secondary | ICD-10-CM | POA: Diagnosis not present

## 2019-01-10 DIAGNOSIS — I129 Hypertensive chronic kidney disease with stage 1 through stage 4 chronic kidney disease, or unspecified chronic kidney disease: Secondary | ICD-10-CM | POA: Diagnosis not present

## 2019-01-10 DIAGNOSIS — IMO0001 Reserved for inherently not codable concepts without codable children: Secondary | ICD-10-CM

## 2019-01-10 MED ORDER — RANOLAZINE ER 500 MG PO TB12: 500.0000 mg | ORAL_TABLET | Freq: Two times a day (BID) | ORAL | 1 refills | Status: DC

## 2019-01-10 NOTE — Progress Notes (Signed)
Subjective:   Jesus Young, male    DOB: 1946/01/14, 73 y.o.   MRN: [id number]  Merrilee Seashore, MD:  Chief Complaint  Patient presents with  . Coronary Artery Disease    4 week F/U    HPI: Jesus Young  is a 73 y.o. male  with known coronary artery disease. He has had CABG in 2012, left bundle branch block that was noted 2014. Recently had complaints of left arm pain with exertion, underwent lexiscan nuclear stress test on 08/07/2018 considered high risk study due to LVEF; however, on echocardiogram had normal LVEF. No significant changes were noted compared to the 2014, but in view of continued symptoms despite medical management, he underwent coronary angiogram on 12/05/2018 and patient was noted to have small vessel disease and microvascular disease.  Patient was started on Ranexa at his last office visit 4 weeks ago; however, did not start due to cost . Post-cath he developed moderate sized right groin hematoma and significant bruising. Pain, bruising, and swelling have all essentially resolved; however, he does have occasional sharp groin pain. He has noticed circular red rash close to the access site.   He was feeling much better in regards to symptoms of angina after his cath; however, over the last 1 week he does have some mild left arm pain again. No other complaints, feels that overall he is still doing better than before.      Past Medical History:  Diagnosis Date  . Arthritis    back and knees  . Bronchitis    hx of 2015  . Cancer [facility])    prostate  . Cataract    immature on right  . Chronic back pain   . Constipation    takes Colace at bedtime  . Coronary artery disease    takes Plavix daily but is on hold for surgery  . Diabetes mellitus without complication ([place])    takes Levemir and Humalog daily  . Dizziness   . Fall    hx of with injury to left shoulder   . GERD (gastroesophageal reflux disease)    takes Protonix daily and Zantac   . H/O urinary frequency   . Hemorrhoids   . History of bladder infections   . History of kidney stones   . Hyperlipidemia    takes Zocor every evening  . Hypertension    takes Metoprolol daily  . Hypothyroidism    takes Synthroid daily  . Myocardial infarction [facility])    many yrs before 2002  . Night muscle spasms    takes Robaxin 4 x day   . Nocturia     Past Surgical History:  Procedure Laterality Date  . CARDIAC CATHETERIZATION  05/17/02  . COLONOSCOPY    . CORONARY ANGIOPLASTY     x 1   . CORONARY ARTERY BYPASS GRAFT  2002   x 5  . eyelid surgery     . LEFT HEART CATH AND CORS/GRAFTS ANGIOGRAPHY N/A 12/05/2018   Procedure: LEFT HEART CATH AND CORS/GRAFTS ANGIOGRAPHY;  Surgeon: Adrian Prows, MD;  Location: [place] [facility];  Service: Cardiovascular;  Laterality: N/A;  . LUMBAR LAMINECTOMY/DECOMPRESSION MICRODISCECTOMY Bilateral 07/18/2013   Procedure: Bilateral Lumbar four-five Lumbar Laminotomy/Microdiskectomy;  Surgeon: Hosie Spangle, MD;  Location: [facility];  Service: Neurosurgery;  Laterality: Bilateral;  Bilateral Lumbar four-five Lumbar Laminotomy/Microdiskectomy  . NEPHROLITHOTOMY Left 06/23/2015   Procedure: NEPHROLITHOTOMY PERCUTANEOUS;  Surgeon: [name], MD;  Location: [facility];  Service: Urology;  Laterality: Left;  . PROSTATECTOMY  10/2009  . skin spots removed from back -precancerous    . TONSILLECTOMY      Family History  Problem Relation Age of Onset  . Heart disease Father   . Heart attack Father   . Thyroid disease Sister     Social History   Socioeconomic History  . Marital status: Married    Spouse name: Not on file  . Number of children: Not on file  . Years of education: Not on file  . Highest education level: Not on file  Occupational History  . Not on file  Social Needs  . Financial resource strain: Not on file  . Food insecurity:    Worry: Not on file    Inability: Not on file  . Transportation needs:    Medical: Not on file      Non-medical: Not on file  Tobacco Use  . Smoking status: Never Smoker  . Smokeless tobacco: Never Used  Substance and Sexual Activity  . Alcohol use: No  . Drug use: No  . Sexual activity: Not Currently  Lifestyle  . Physical activity:    Days per week: Not on file    Minutes per session: Not on file  . Stress: Not on file  Relationships  . Social connections:    Talks on phone: Not on file    Gets together: Not on file    Attends religious service: Not on file    Active member of club or organization: Not on file    Attends meetings of clubs or organizations: Not on file    Relationship status: Not on file  . Intimate partner violence:    Fear of current or ex partner: Not on file    Emotionally abused: Not on file    Physically abused: Not on file    Forced sexual activity: Not on file  Other Topics Concern  . Not on file  Social History Narrative  . Not on file    Current Meds  Medication Sig  . acetaminophen (TYLENOL) 650 MG CR tablet Take 1,300 mg by mouth every 8 (eight) hours as needed for pain.  Marland Kitchen amLODipine (NORVASC) 5 MG tablet Take 5 mg by mouth every evening.  Marland Kitchen aspirin EC 81 MG tablet Take 81 mg by mouth at bedtime.   . B-D ULTRAFINE III SHORT PEN 31G X 8 MM MISC USE UTD  . busPIRone (BUSPAR) 7.5 MG tablet Take 7.5 mg by mouth 2 (two) times daily.  . clopidogrel (PLAVIX) 75 MG tablet Take 75 mg by mouth at bedtime.  . docusate sodium (COLACE) 100 MG capsule Take 400 mg by mouth at bedtime.   . insulin detemir (LEVEMIR) 100 UNIT/ML injection Inject 0.16 mLs (16 Units total) into the skin 2 (two) times daily. (Patient taking differently: Inject 18 Units into the skin 2 (two) times daily. )  . insulin lispro (HUMALOG) 100 UNIT/ML injection Inject 0.05-0.09 mLs (5-9 Units total) into the skin 2 (two) times daily. 5 units at lunch, and 9 units after dinner. (Patient taking differently: Inject 5-12 Units into the skin 2 (two) times daily. Typically 8 units after  lunch, and 12 units after dinner. (sliding scale insulin))  . isosorbide mononitrate (IMDUR) 120 MG 24 hr tablet Take 120 mg by mouth every evening.  Marland Kitchen levothyroxine (SYNTHROID, LEVOTHROID) 100 MCG tablet Take 100 mcg by mouth See admin instructions. Take 1 tablet (100 mcg) by mouth daily in the  morning, except on Sundays  . metoprolol (LOPRESSOR) 50 MG tablet Take 50 mg by mouth 2 (two) times daily.   . nitroGLYCERIN (NITROSTAT) 0.4 MG SL tablet Place 1 tablet under the tongue as needed.  . ONE TOUCH ULTRA TEST test strip   . pantoprazole (PROTONIX) 40 MG tablet Take 40 mg by mouth at bedtime.   . ranitidine (ZANTAC) 300 MG tablet Take 300 mg by mouth at bedtime.  . rosuvastatin (CRESTOR) 20 MG tablet Take 20 mg by mouth every evening.     Review of Systems  Constitution: Negative for decreased appetite, malaise/fatigue, weight gain and weight loss.  Eyes: Negative for visual disturbance.  Cardiovascular: Negative for chest pain, claudication, dyspnea on exertion, leg swelling, orthopnea, palpitations and syncope.       Occasional left arm pain  Respiratory: Negative for hemoptysis and wheezing.   Endocrine: Negative for cold intolerance and heat intolerance.  Hematologic/Lymphatic: Does not bruise/bleed easily.  Skin: Positive for rash (right groin). Negative for nail changes.  Musculoskeletal: Negative for muscle weakness and myalgias.  Gastrointestinal: Positive for change in bowel habit. Negative for abdominal pain, nausea and vomiting.  Neurological: Negative for difficulty with concentration, dizziness, focal weakness and headaches.  Psychiatric/Behavioral: Negative for altered mental status and suicidal ideas.  All other systems reviewed and are negative.      Objective:     Blood pressure 136/76, pulse 62, height 6\' 3"  (1.905 m), weight 180 lb 8 oz (81.9 kg), SpO2 97 %.  Echocardiogram  08/19/2018: Left ventricle cavity is normal in size. Abnormal septal wall motion due  to left bundle branch block. Doppler evidence of grade I (impaired) diastolic dysfunction, normal LAP. Low normal LV systolic function. Visual EF is 50-55%. Calculated EF 58%. Structurally normal tricuspid valve with trace regurgitation. Structurally normal pulmonic valve with trace regurgitation. Compared to the study done on 01/22/2013, no significant change  Lexiscan myoview stress test 08/07/2018: 1. Lexiscan stress test was performed. Exercise capacity was not assessed. No stress symptoms reported. Peak effect blood pressure was 164/88 mmHg. The resting and stress electrocardiogram demonstrated normal sinus rhythm and LBBB. Stress EKG is non diagnostic for ischemia as it is a pharmacologic stress. 2. The overall quality of the study is good. Left ventricular cavity is noted to be normal on the rest and stress studies. Gated SPECT images reveal normal myocardial thickening and wall motion. The left ventricular ejection fraction was calculated at 32%, although visually appears near normal. Recommend correlation with echocardiogram. Mid inferior perfusion defect seen worse on rest images without corresponding wall motion abnormality likely represents tissue attenuation artifact. 3. High risk study due to low LVEF. Clinical and echocardiographic correlation recomme  Coronary angiogram 12/05/2018: Left main diffusely diseased and occluded proximal LAD and circumflex pulmonary artery.  RCA occluded in the proximal segment.  SVG to RCA is patent, diffusely diseased PL branch with high-grade stenosis of 90 to 99% and PDA has about a 60 to 70% mid stenosis.  Small vessels. LAD is diffusely diseased.  It is occluded in the proximal segment.  Supplied by LIMA to LAD.  Distal to the insertion, there is a 60 to 70% diffuse disease.  Small vessel distally. Free radial graft to OM1 is widely patent.  Distal circumflex has a high-grade lesion, small vessel. SVG to D1 is occluded. Normal LV systolic function, mild  posterior hypokinesis.  Normal LVEDP.  EF estimated at 55%.  Physical Exam  Constitutional: He is oriented to person, place, and time. Vital  signs are normal. He appears well-developed and well-nourished.  HENT:  Head: Normocephalic and atraumatic.  Neck: Normal range of motion.  Cardiovascular: Normal rate, regular rhythm, normal heart sounds and intact distal pulses.  Pulmonary/Chest: Effort normal and breath sounds normal. No accessory muscle usage. No respiratory distress.  Abdominal: Soft. Bowel sounds are normal.  Musculoskeletal: Normal range of motion.  Neurological: He is alert and oriented to person, place, and time.  Skin: Skin is warm and dry.     Raised red ring with flakiness of the skin and white center  Vitals reviewed.          Assessment & Recommendations:   1. Coronary artery disease of native artery of native heart with stable angina pectoris [facility]) Patient continues to overall feel well, but has noticed over the last 1 week recurrence of occasional left arm pain.  He did not start Ranexa that was prescribed at his last office visit due to cost, but he has found where he can get it cheaper.  Continue to feel that he needs aggressive medical therapy for his CAD and as he is having recurrence of symptoms, I recommended that he start Ranexa 500 mg twice daily.  Can increase to 1000 mg twice a day if needed.  Continue with Imdur, Amlodipine, metoprolol, and DAPT.  Patient is a candidate for [place] angina trial; however, after further discussion and research he does not want to participate at this time.  2. Type 2 DM with CKD and hypertension ([place]) Being managed by PCP.  Chronic kidney disease has remained stable.  Blood pressure is well controlled.  3. Tinea cruris He is noted to have right groin rash that is suggestive of fungal infection for the last 1 week.  Encouraged him to pick up antifungal cream to be used twice a day for the next 2 weeks.  If symptoms do not  improve, encouraged him to see his PCP.   I will see him back in 4 weeks for continued close monitoring of his symptoms.  Feel that patient would be a good candidate for cardiac rehab and will place referral for this.    Jeri Lager, FNP-C [facility], [place] Office: [phone] Fax: [phone]

## 2019-01-11 ENCOUNTER — Other Ambulatory Visit: Payer: Self-pay | Admitting: Cardiology

## 2019-01-13 DIAGNOSIS — B356 Tinea cruris: Secondary | ICD-10-CM | POA: Insufficient documentation

## 2019-01-26 ENCOUNTER — Telehealth (HOSPITAL_COMMUNITY): Payer: Self-pay

## 2019-01-26 DIAGNOSIS — E1121 Type 2 diabetes mellitus with diabetic nephropathy: Secondary | ICD-10-CM | POA: Diagnosis not present

## 2019-01-26 DIAGNOSIS — M79673 Pain in unspecified foot: Secondary | ICD-10-CM | POA: Diagnosis not present

## 2019-01-26 NOTE — Telephone Encounter (Signed)
Attempted to call patient in regards to Cardiac Rehab - LM on VM 

## 2019-01-26 NOTE — Telephone Encounter (Signed)
Pt insurance is active and benefits verified through [facility]. Co-pay $20.00, DED $0.00/$0.00 met, out of pocket $4,500.00/$373.93 met, co-insurance 0%. No pre-authorization required. [employer], 01/26/2019 @ 248PM, REF# [id number]

## 2019-01-31 NOTE — Telephone Encounter (Signed)
Called and spoke with pt in regards to CR, adv we have recv'd the pt referral. And at this time we are not scheduling due to the COVID-19. Once we have resume scheduling we will contact the pt. She/He verbalized understanding. Pt stated that he wanted to observe before agreeing to it.Marland Kitchen advise pt that he can come and observe and that we will call him once we start scheduling. Tedra Sen[place]. Support Rep II

## 2019-02-01 ENCOUNTER — Encounter: Payer: Self-pay | Admitting: Podiatry

## 2019-02-01 ENCOUNTER — Other Ambulatory Visit: Payer: Self-pay

## 2019-02-01 ENCOUNTER — Ambulatory Visit (INDEPENDENT_AMBULATORY_CARE_PROVIDER_SITE_OTHER): Payer: Medicare Other

## 2019-02-01 ENCOUNTER — Ambulatory Visit: Payer: Medicare Other | Admitting: Podiatry

## 2019-02-01 DIAGNOSIS — E119 Type 2 diabetes mellitus without complications: Secondary | ICD-10-CM | POA: Diagnosis not present

## 2019-02-01 DIAGNOSIS — M779 Enthesopathy, unspecified: Secondary | ICD-10-CM | POA: Diagnosis not present

## 2019-02-01 DIAGNOSIS — M778 Other enthesopathies, not elsewhere classified: Secondary | ICD-10-CM

## 2019-02-01 DIAGNOSIS — M722 Plantar fascial fibromatosis: Secondary | ICD-10-CM

## 2019-02-01 DIAGNOSIS — Q828 Other specified congenital malformations of skin: Secondary | ICD-10-CM

## 2019-02-01 NOTE — Progress Notes (Signed)
He presents today chief complaint of left heel pain and a painful lesion beneath the fifth metatarsal head of the left foot.  Objective: Vital signs are stable he is alert oriented x3 his pain on palpation medial care tubercle of the left heel.  He also has a reactive hyperkeratotic lesion with soft tissue swelling beneath the lesion sub-fifth metatarsal left foot.  Assessment: Capsulitis bursitis reactive hyperkeratotic lesion sub-fifth metatarsal of the left foot.  Plantar fasciitis left foot.  Plan: At this point after sterile Betadine skin prep I injected 20 mg Kenalog 5 mg Marcaine for maximal tenderness of the left heel.  Injected 2 mg of dexamethasone sub-leisurely fifth metatarsal head.  I also debrided the reactive hyperkeratotic lesion.  Discussed appropriate shoe gear stretching exercise ice therapy shoe modifications he will notify us if this does not resolve.

## 2019-02-01 NOTE — Patient Instructions (Signed)

## 2019-02-12 ENCOUNTER — Encounter: Payer: Self-pay | Admitting: Cardiology

## 2019-02-12 ENCOUNTER — Other Ambulatory Visit: Payer: Self-pay

## 2019-02-12 ENCOUNTER — Ambulatory Visit: Payer: Medicare Other | Admitting: Cardiology

## 2019-02-12 VITALS — BP 140/73 | HR 67 | Ht 75.0 in | Wt 179.0 lb

## 2019-02-12 DIAGNOSIS — I10 Essential (primary) hypertension: Secondary | ICD-10-CM

## 2019-02-12 DIAGNOSIS — B356 Tinea cruris: Secondary | ICD-10-CM

## 2019-02-12 DIAGNOSIS — I25118 Atherosclerotic heart disease of native coronary artery with other forms of angina pectoris: Secondary | ICD-10-CM | POA: Diagnosis not present

## 2019-02-12 MED ORDER — RANOLAZINE ER 500 MG PO TB12: 1000.0000 mg | ORAL_TABLET | Freq: Two times a day (BID) | ORAL | 2 refills | Status: DC

## 2019-02-12 NOTE — Progress Notes (Signed)
Subjective:   Jesus Young, male    DOB: 1945-12-11, 73 y.o.   MRN: [id number]  Merrilee Seashore, MD:  Chief Complaint  Patient presents with  . Coronary Artery Disease    1 month f/u     HPI: Jesus Young  is a 73 y.o. male  with known coronary artery disease. He has had CABG in 2012, left bundle branch block that was noted 2014. Recently had complaints of left arm pain with exertion, underwent lexiscan nuclear stress test on 08/07/2018 considered high risk study due to LVEF; however, on echocardiogram had normal LVEF. No significant changes were noted compared to the 2014, but in view of continued symptoms despite medical management, he underwent coronary angiogram on 12/05/2018 and patient was noted to have small vessel disease and microvascular disease.  Patient was last seen 4 weeks ago for continued angina. He was started on Ranexa 500mg  twice a day. He continues to feel that his symptoms have overall improved. He has not been able to start cardiac rehab in view of COVID19, but has been active at home and is tolerating this well. He does continue to feel occasional left arm pressure and questions if he should try increasing the dose of Ranexa. He is tolerating medications well. Does have slight mild headache that is not bothersome to him. He states blood pressure has been well controlled.Marland Kitchen   He was noted to have circular red rash close to the access site that is felt to be fungal infection. OTC treatment was recommended. He admits to doing this sporadically; therefore still has a rash. Pain and swelling has all resolved.     Past Medical History:  Diagnosis Date  . Arthritis    back and knees  . Bronchitis    hx of 2015  . Cancer [facility])    prostate  . Cataract    immature on right  . Chronic back pain   . Constipation    takes Colace at bedtime  . Coronary artery disease    takes Plavix daily but is on hold for surgery  . Diabetes mellitus without  complication ([place])    takes Levemir and Humalog daily  . Dizziness   . Fall    hx of with injury to left shoulder   . GERD (gastroesophageal reflux disease)    takes Protonix daily and Zantac  . H/O urinary frequency   . Hemorrhoids   . History of bladder infections   . History of kidney stones   . Hyperlipidemia    takes Zocor every evening  . Hypertension    takes Metoprolol daily  . Hypothyroidism    takes Synthroid daily  . Myocardial infarction [facility])    many yrs before 2002  . Night muscle spasms    takes Robaxin 4 x day   . Nocturia     Past Surgical History:  Procedure Laterality Date  . CARDIAC CATHETERIZATION  05/17/02  . COLONOSCOPY    . CORONARY ANGIOPLASTY     x 1   . CORONARY ARTERY BYPASS GRAFT  2002   x 5  . eyelid surgery     . LEFT HEART CATH AND CORS/GRAFTS ANGIOGRAPHY N/A 12/05/2018   Procedure: LEFT HEART CATH AND CORS/GRAFTS ANGIOGRAPHY;  Surgeon: Adrian Prows, MD;  Location: [facility];  Service: Cardiovascular;  Laterality: N/A;  . LUMBAR LAMINECTOMY/DECOMPRESSION MICRODISCECTOMY Bilateral 07/18/2013   Procedure: Bilateral Lumbar four-five Lumbar Laminotomy/Microdiskectomy;  Surgeon: Hosie Spangle, MD;  Location: [place] [facility];  Service: Neurosurgery;  Laterality: Bilateral;  Bilateral Lumbar four-five Lumbar Laminotomy/Microdiskectomy  . NEPHROLITHOTOMY Left 06/23/2015   Procedure: NEPHROLITHOTOMY PERCUTANEOUS;  Surgeon: [name], MD;  Location: [facility];  Service: Urology;  Laterality: Left;  . PROSTATECTOMY  10/2009  . skin spots removed from back -precancerous    . TONSILLECTOMY      Family History  Problem Relation Age of Onset  . Heart disease Father   . Heart attack Father   . Thyroid disease Sister     Social History   Socioeconomic History  . Marital status: Married    Spouse name: Not on file  . Number of children: Not on file  . Years of education: Not on file  . Highest education level: Not on file  Occupational  History  . Not on file  Social Needs  . Financial resource strain: Not on file  . Food insecurity:    Worry: Not on file    Inability: Not on file  . Transportation needs:    Medical: Not on file    Non-medical: Not on file  Tobacco Use  . Smoking status: Never Smoker  . Smokeless tobacco: Never Used  Substance and Sexual Activity  . Alcohol use: No  . Drug use: No  . Sexual activity: Not Currently  Lifestyle  . Physical activity:    Days per week: Not on file    Minutes per session: Not on file  . Stress: Not on file  Relationships  . Social connections:    Talks on phone: Not on file    Gets together: Not on file    Attends religious service: Not on file    Active member of club or organization: Not on file    Attends meetings of clubs or organizations: Not on file    Relationship status: Not on file  . Intimate partner violence:    Fear of current or ex partner: Not on file    Emotionally abused: Not on file    Physically abused: Not on file    Forced sexual activity: Not on file  Other Topics Concern  . Not on file  Social History Narrative  . Not on file    Current Meds  Medication Sig  . acetaminophen (TYLENOL) 650 MG CR tablet Take 1,300 mg by mouth 2 (two) times daily.   Marland Kitchen amLODipine (NORVASC) 5 MG tablet Take 5 mg by mouth every evening.  Marland Kitchen aspirin EC 81 MG tablet Take 81 mg by mouth at bedtime.   . B-D ULTRAFINE III SHORT PEN 31G X 8 MM MISC USE UTD  . busPIRone (BUSPAR) 7.5 MG tablet Take 7.5 mg by mouth 2 (two) times daily.  . clopidogrel (PLAVIX) 75 MG tablet Take 75 mg by mouth at bedtime.  . diclofenac sodium (VOLTAREN) 1 % GEL APP EXT AA BID FOR 10 DAYS UTD PRN  . insulin detemir (LEVEMIR) 100 UNIT/ML injection Inject 0.16 mLs (16 Units total) into the skin 2 (two) times daily. (Patient taking differently: Inject 18 Units into the skin 2 (two) times daily. )  . insulin lispro (HUMALOG) 100 UNIT/ML injection Inject 0.05-0.09 mLs (5-9 Units total) into  the skin 2 (two) times daily. 5 units at lunch, and 9 units after dinner. (Patient taking differently: Inject 5-12 Units into the skin 2 (two) times daily. Typically 8 units after lunch, and 12 units after dinner. (sliding scale insulin))  . isosorbide mononitrate (IMDUR) 120 MG 24 hr tablet  TAKE 1 TABLET BY MOUTH EVERY DAY  . levothyroxine (SYNTHROID, LEVOTHROID) 100 MCG tablet Take 100 mcg by mouth See admin instructions. Take 1 tablet (100 mcg) by mouth daily in the morning, except on Sundays  . metoprolol (LOPRESSOR) 50 MG tablet Take 50 mg by mouth 2 (two) times daily.   . nitroGLYCERIN (NITROSTAT) 0.4 MG SL tablet Place 1 tablet under the tongue as needed.  . ONE TOUCH ULTRA TEST test strip   . pantoprazole (PROTONIX) 40 MG tablet Take 40 mg by mouth at bedtime.   . ranitidine (ZANTAC) 300 MG tablet Take 300 mg by mouth at bedtime.  . ranolazine (RANEXA) 500 MG 12 hr tablet Take 1 tablet (500 mg total) by mouth 2 (two) times daily.  . rosuvastatin (CRESTOR) 20 MG tablet Take 20 mg by mouth every evening.     Review of Systems  Constitution: Negative for decreased appetite, malaise/fatigue, weight gain and weight loss.  Eyes: Negative for visual disturbance.  Cardiovascular: Positive for chest pain (continued left arm pain, but overall improved from before). Negative for claudication, dyspnea on exertion, leg swelling, orthopnea, palpitations and syncope.       Occasional left arm pain  Respiratory: Negative for hemoptysis and wheezing.   Endocrine: Negative for cold intolerance and heat intolerance.  Hematologic/Lymphatic: Does not bruise/bleed easily.  Skin: Positive for rash (right groin). Negative for nail changes.  Musculoskeletal: Negative for muscle weakness and myalgias.  Gastrointestinal: Positive for change in bowel habit. Negative for abdominal pain, nausea and vomiting.  Neurological: Positive for headaches. Negative for difficulty with concentration, dizziness and focal  weakness.  Psychiatric/Behavioral: Negative for altered mental status and suicidal ideas.  All other systems reviewed and are negative.      Objective:     Blood pressure 140/73, pulse 67, height 6\' 3"  (1.905 m), weight 179 lb (81.2 kg), SpO2 97 %.  Echocardiogram  08/19/2018: Left ventricle cavity is normal in size. Abnormal septal wall motion due to left bundle branch block. Doppler evidence of grade I (impaired) diastolic dysfunction, normal LAP. Low normal LV systolic function. Visual EF is 50-55%. Calculated EF 58%. Structurally normal tricuspid valve with trace regurgitation. Structurally normal pulmonic valve with trace regurgitation. Compared to the study done on 01/22/2013, no significant change  Lexiscan myoview stress test 08/07/2018: 1. Lexiscan stress test was performed. Exercise capacity was not assessed. No stress symptoms reported. Peak effect blood pressure was 164/88 mmHg. The resting and stress electrocardiogram demonstrated normal sinus rhythm and LBBB. Stress EKG is non diagnostic for ischemia as it is a pharmacologic stress. 2. The overall quality of the study is good. Left ventricular cavity is noted to be normal on the rest and stress studies. Gated SPECT images reveal normal myocardial thickening and wall motion. The left ventricular ejection fraction was calculated at 32%, although visually appears near normal. Recommend correlation with echocardiogram. Mid inferior perfusion defect seen worse on rest images without corresponding wall motion abnormality likely represents tissue attenuation artifact. 3. High risk study due to low LVEF. Clinical and echocardiographic correlation recomme  Coronary angiogram 12/05/2018: Left main diffusely diseased and occluded proximal LAD and circumflex pulmonary artery.  RCA occluded in the proximal segment.  SVG to RCA is patent, diffusely diseased PL branch with high-grade stenosis of 90 to 99% and PDA has about a 60 to 70% mid  stenosis.  Small vessels. LAD is diffusely diseased.  It is occluded in the proximal segment.  Supplied by LIMA to LAD.  Distal to  the insertion, there is a 60 to 70% diffuse disease.  Small vessel distally. Free radial graft to OM1 is widely patent.  Distal circumflex has a high-grade lesion, small vessel. SVG to D1 is occluded. Normal LV systolic function, mild posterior hypokinesis.  Normal LVEDP.  EF estimated at 55%.  Physical Exam  Constitutional: He is oriented to person, place, and time. Vital signs are normal. He appears well-developed and well-nourished.  HENT:  Head: Normocephalic and atraumatic.  Neck: Normal range of motion.  Cardiovascular: Normal rate, regular rhythm, normal heart sounds and intact distal pulses.  Pulmonary/Chest: Effort normal and breath sounds normal. No accessory muscle usage. No respiratory distress.  Abdominal: Soft. Bowel sounds are normal.  Musculoskeletal: Normal range of motion.  Neurological: He is alert and oriented to person, place, and time.  Skin: Skin is warm and dry.     Raised red ring with flakiness of the skin and white center  Vitals reviewed.       Assessment & Recommendations:   1. Coronary artery disease of native artery of native heart with stable angina pectoris [facility]) Patient continues to overall feel well and better than before since being on aggressive medical therapy. He wishes to try increasing his Ranexa to 1000mg  to see if he can continue to notice improvement in left arm pressure. I feel this is reasonable. He is slightly concerned about not being able to tolerate the higher dose; therefore, I have sent in 500mg  dose, but instructed on taking 1000mg  BID, should he not tolerate and need to decrease back to 500mg . Continue with other medications as prescribed. He will start cardiac rehab once able.   2. Essential hypertension Slightly elevated today, but well controlled at home. No changes in medications today. Continue with  home monitoring.  3. Tinea cruris Continues to have fungal infection by his report. Advised him to use OTC treatment regularly and to consider seeing his PCP for further evaluation.   Plan: Overall, feel that patient is stable from cardiac standpoint. I will see him back in 3 months or sooner if problems.    Jeri Lager, FNP-C [facility], [place] Office: [phone] Fax: [phone]

## 2019-02-21 ENCOUNTER — Other Ambulatory Visit: Payer: Self-pay | Admitting: Cardiology

## 2019-03-15 ENCOUNTER — Telehealth (HOSPITAL_COMMUNITY): Payer: Self-pay

## 2019-03-23 ENCOUNTER — Other Ambulatory Visit: Payer: Self-pay | Admitting: Cardiology

## 2019-03-23 NOTE — Telephone Encounter (Signed)
Please fill

## 2019-05-09 DIAGNOSIS — E1121 Type 2 diabetes mellitus with diabetic nephropathy: Secondary | ICD-10-CM | POA: Diagnosis not present

## 2019-05-09 DIAGNOSIS — E1165 Type 2 diabetes mellitus with hyperglycemia: Secondary | ICD-10-CM | POA: Diagnosis not present

## 2019-05-11 ENCOUNTER — Telehealth (HOSPITAL_COMMUNITY): Payer: Self-pay

## 2019-05-11 NOTE — Telephone Encounter (Signed)
No response from pt, closed referral. °

## 2019-05-14 NOTE — Progress Notes (Signed)
Primary Physician:  Merrilee Seashore, MD   [name], male    DOB: 1946-04-23, 73 y.o.   MRN: [id number]  Subjective:    Chief Complaint  Patient presents with  . Coronary Artery Disease  . Chest Pain  . Follow-up    24mo   This visit type was conducted due to national recommendations for restrictions regarding the COVID-19 Pandemic (e.g. social distancing).  This format is felt to be most appropriate for this patient at this time.  All issues noted in this document were discussed and addressed.  No physical exam was performed (except for noted visual exam findings with Telehealth visits).  The patient has consented to conduct a Telehealth visit and understands insurance will be billed.   I discussed the limitations of evaluation and management by telemedicine and the availability of in person appointments. The patient expressed understanding and agreed to proceed.  Virtual Visit via Video Note is as below  I connected with@, on 05/15/19 at 0930 by telephone and verified that I am speaking with the correct person using two identifiers. Unable to perform video visit as patient did not have equipment.    I have discussed with the patient regarding the safety during COVID Pandemic and steps and precautions including social distancing with the patient.    HPI: Jesus Young  is a 73 y.o. male  with known coronary artery disease s/p CABG in 2012, left bundle branch block that was noted 2014, hypertension, diabetes, hyperlipidemia and CKD stage 3.  Due tocomplaints of left arm pain with exertion, underwent lexiscan nuclear stress test on 08/07/2018 considered high risk study due to LVEF; however, on echocardiogram had normal LVEF. No significant changes were noted compared to the 2014, but in view of continued symptoms despite medical management, he underwent coronary angiogram on 12/05/2018 and patient was noted to have small vessel disease and microvascular  disease.  Patient was last seen 3 months ago. He has had improvement in left arm pressure, Ranexa was increased at his last office visit. He continues to feel that his symptoms have overall improved. States that he feels great. Able to do all activities that he wishes.  He is tolerating medications well. Does have slight mild headache that is not bothersome to him. He states blood pressure has been well controlled. Does mention "ringing" noise in ears over the last 1-2 weeks. He states this also happened after CABG, but later resolved. He is not bothered by this, but does notice this particularly when it is quiet.  He reports previous groin rash felt to be fungal infection has resolved.    Past Medical History:  Diagnosis Date  . Arthritis    back and knees  . Bronchitis    hx of 2015  . Cancer [facility])    prostate  . Cataract    immature on right  . Chronic back pain   . Constipation    takes Colace at bedtime  . Coronary artery disease    takes Plavix daily but is on hold for surgery  . Diabetes mellitus without complication ([place])    takes Levemir and Humalog daily  . Dizziness   . Fall    hx of with injury to left shoulder   . GERD (gastroesophageal reflux disease)    takes Protonix daily and Zantac  . H/O urinary frequency   . Hemorrhoids   . History of bladder infections   . History of kidney stones   .  Hyperlipidemia    takes Zocor every evening  . Hypertension    takes Metoprolol daily  . Hypothyroidism    takes Synthroid daily  . Myocardial infarction [facility])    many yrs before 2002  . Night muscle spasms    takes Robaxin 4 x day   . Nocturia     Past Surgical History:  Procedure Laterality Date  . CARDIAC CATHETERIZATION  05/17/02  . COLONOSCOPY    . CORONARY ANGIOPLASTY     x 1   . CORONARY ARTERY BYPASS GRAFT  2002   x 5  . eyelid surgery     . LEFT HEART CATH AND CORS/GRAFTS ANGIOGRAPHY N/A 12/05/2018   Procedure: LEFT HEART CATH AND CORS/GRAFTS  ANGIOGRAPHY;  Surgeon: Adrian Prows, MD;  Location: [place] [facility];  Service: Cardiovascular;  Laterality: N/A;  . LUMBAR LAMINECTOMY/DECOMPRESSION MICRODISCECTOMY Bilateral 07/18/2013   Procedure: Bilateral Lumbar four-five Lumbar Laminotomy/Microdiskectomy;  Surgeon: Hosie Spangle, MD;  Location: [place] [facility];  Service: Neurosurgery;  Laterality: Bilateral;  Bilateral Lumbar four-five Lumbar Laminotomy/Microdiskectomy  . NEPHROLITHOTOMY Left 06/23/2015   Procedure: NEPHROLITHOTOMY PERCUTANEOUS;  Surgeon: [name], MD;  Location: [facility];  Service: Urology;  Laterality: Left;  . PROSTATECTOMY  10/2009  . skin spots removed from back -precancerous    . TONSILLECTOMY      Social History   Socioeconomic History  . Marital status: Married    Spouse name: Not on file  . Number of children: 0  . Years of education: Not on file  . Highest education level: Not on file  Occupational History  . Not on file  Social Needs  . Financial resource strain: Not on file  . Food insecurity    Worry: Not on file    Inability: Not on file  . Transportation needs    Medical: Not on file    Non-medical: Not on file  Tobacco Use  . Smoking status: Never Smoker  . Smokeless tobacco: Never Used  Substance and Sexual Activity  . Alcohol use: No  . Drug use: No  . Sexual activity: Not Currently  Lifestyle  . Physical activity    Days per week: Not on file    Minutes per session: Not on file  . Stress: Not on file  Relationships  . Social Herbalist on phone: Not on file    Gets together: Not on file    Attends religious service: Not on file    Active member of club or organization: Not on file    Attends meetings of clubs or organizations: Not on file    Relationship status: Not on file  . Intimate partner violence    Fear of current or ex partner: Not on file    Emotionally abused: Not on file    Physically abused: Not on file    Forced sexual activity: Not on file  Other  Topics Concern  . Not on file  Social History Narrative  . Not on file    Review of Systems  Constitution: Negative for decreased appetite, malaise/fatigue, weight gain and weight loss.  Eyes: Negative for visual disturbance.  Cardiovascular: Negative for chest pain (continued left arm pain, but overall improved from before), claudication, dyspnea on exertion, leg swelling, orthopnea, palpitations and syncope.       Occasional left arm pain  Respiratory: Negative for hemoptysis and wheezing.   Endocrine: Negative for cold intolerance and heat intolerance.  Hematologic/Lymphatic: Does not bruise/bleed easily.  Skin: Negative for nail changes and rash (right groin).  Musculoskeletal: Negative for muscle weakness and myalgias.  Gastrointestinal: Positive for change in bowel habit. Negative for abdominal pain, nausea and vomiting.  Neurological: Positive for headaches. Negative for difficulty with concentration, dizziness and focal weakness.  Psychiatric/Behavioral: Negative for altered mental status and suicidal ideas.  All other systems reviewed and are negative.     Objective:  There were no vitals taken for this visit. There is no height or weight on file to calculate BMI.    Physical exam not performed or limited due to virtual visit.    Please see exam details from prior visit is as below.   Physical Exam  Constitutional: He is oriented to person, place, and time. Vital signs are normal. He appears well-developed and well-nourished.  HENT:  Head: Normocephalic and atraumatic.  Neck: Normal range of motion.  Cardiovascular: Normal rate, regular rhythm, normal heart sounds and intact distal pulses.  Pulmonary/Chest: Effort normal and breath sounds normal. No accessory muscle usage. No respiratory distress.  Abdominal: Soft. Bowel sounds are normal.  Musculoskeletal: Normal range of motion.  Neurological: He is alert and oriented to person, place, and time.  Skin: Skin is warm  and dry.     Raised red ring with flakiness of the skin and white center  Vitals reviewed.  Radiology: No results found.  Laboratory examination:    CMP Latest Ref Rng & Units 10/22/2017 11/21/2016 10/30/2015  Glucose 65 - 99 mg/dL 119(H) 226(H) 113(H)  BUN 6 - 20 mg/dL 21(H) 36(H) 26(H)  Creatinine 0.61 - 1.24 mg/dL 1.42(H) 1.57(H) 1.68(H)  Sodium 135 - 145 mmol/L 140 137 138  Potassium 3.5 - 5.1 mmol/L 4.0 5.1 4.4  Chloride 101 - 111 mmol/L 106 102 106  CO2 22 - 32 mmol/L 25 24 26   Calcium 8.9 - 10.3 mg/dL 9.2 8.8(L) 9.2  Total Protein 6.5 - 8.1 g/dL - 6.6 -  Total Bilirubin 0.3 - 1.2 mg/dL - 0.9 -  Alkaline Phos 38 - 126 U/L - 46 -  AST 15 - 41 U/L - 37 -  ALT 17 - 63 U/L - 47 -   CBC Latest Ref Rng & Units 12/06/2018 12/05/2018 10/22/2017  WBC 3.8 - 10.6 K/uL - - 6.2  Hemoglobin 13.0 - 17.0 g/dL 14.2 14.3 17.2  Hematocrit 39.0 - 52.0 % 43.7 44.9 52.4(H)  Platelets 150 - 440 K/uL - - 165   Lipid Panel  No results found for: CHOL, TRIG, HDL, CHOLHDL, VLDL, LDLCALC, LDLDIRECT HEMOGLOBIN A1C No results found for: HGBA1C, MPG TSH No results for input(s): TSH in the last 8760 hours.  PRN Meds:. Medications Discontinued During This Encounter  Medication Reason  . diclofenac sodium (VOLTAREN) 1 % GEL Prescription never filled  . ranitidine (ZANTAC) 300 MG tablet No longer needed (for PRN medications)   Current Meds  Medication Sig  . acetaminophen (TYLENOL) 650 MG CR tablet Take 1,300 mg by mouth 3 (three) times daily. PRN  . amLODipine (NORVASC) 5 MG tablet TAKE 1 TABLET BY MOUTH DAILY  . aspirin EC 81 MG tablet Take 81 mg by mouth at bedtime.   . busPIRone (BUSPAR) 10 MG tablet Take 10 mg by mouth 2 (two) times daily.   . clopidogrel (PLAVIX) 75 MG tablet Take 75 mg by mouth at bedtime.  . docusate sodium (COLACE) 100 MG capsule Take 400 mg by mouth at bedtime.   . famotidine (PEPCID) 20 MG tablet 2 (two) times a day.  Marland Kitchen  insulin detemir (LEVEMIR) 100 UNIT/ML injection  Inject 0.16 mLs (16 Units total) into the skin 2 (two) times daily. (Patient taking differently: Inject 18 Units into the skin 2 (two) times daily. )  . insulin lispro (HUMALOG) 100 UNIT/ML injection Inject 0.05-0.09 mLs (5-9 Units total) into the skin 2 (two) times daily. 5 units at lunch, and 9 units after dinner. (Patient taking differently: Inject 5-11 Units into the skin 2 (two) times daily. Typically 8 units after lunch, and 12 units after dinner. (sliding scale insulin))  . isosorbide mononitrate (IMDUR) 120 MG 24 hr tablet TAKE 1 TABLET BY MOUTH EVERY DAY  . levothyroxine (SYNTHROID, LEVOTHROID) 100 MCG tablet Take 100 mcg by mouth See admin instructions. Take 1 tablet (100 mcg) by mouth daily in the morning, except on Sundays  . metoprolol (LOPRESSOR) 50 MG tablet Take 50 mg by mouth 2 (two) times daily.   . nitroGLYCERIN (NITROSTAT) 0.4 MG SL tablet Place 1 tablet under the tongue as needed.  . pantoprazole (PROTONIX) 40 MG tablet Take 40 mg by mouth at bedtime.   . ranolazine (RANEXA) 500 MG 12 hr tablet Take 2 tablets (1,000 mg total) by mouth 2 (two) times daily.  . rosuvastatin (CRESTOR) 20 MG tablet Take 20 mg by mouth every evening.    Cardiac Studies:   Echocardiogram  08/19/2018: Left ventricle cavity is normal in size. Abnormal septal wall motion due to left bundle branch block. Doppler evidence of grade I (impaired) diastolic dysfunction, normal LAP. Low normal LV systolic function. Visual EF is 50-55%. Calculated EF 58%. Structurally normal tricuspid valve with trace regurgitation. Structurally normal pulmonic valve with trace regurgitation. Compared to the study done on 01/22/2013, no significant change  Lexiscan myoview stress test 08/07/2018: 1. Lexiscan stress test was performed. Exercise capacity was not assessed. No stress symptoms reported. Peak effect blood pressure was 164/88 mmHg. The resting and stress electrocardiogram demonstrated normal sinus rhythm and LBBB.  Stress EKG is non diagnostic for ischemia as it is a pharmacologic stress. 2. The overall quality of the study is good. Left ventricular cavity is noted to be normal on the rest and stress studies. Gated SPECT images reveal normal myocardial thickening and wall motion. The left ventricular ejection fraction was calculated at 32%, although visually appears near normal. Recommend correlation with echocardiogram. Mid inferior perfusion defect seen worse on rest images without corresponding wall motion abnormality likely represents tissue attenuation artifact. 3. High risk study due to low LVEF. Clinical and echocardiographic correlation recomme  Coronary angiogram 12/05/2018: Left main diffusely diseased and occluded proximal LAD and circumflex pulmonary artery. RCA occluded in the proximal segment. SVG to RCA is patent, diffusely diseased PL branch with high-grade stenosis of 90 to 99% and PDA has about a 60 to 70% mid stenosis. Small vessels. LAD is diffusely diseased. It is occluded in the proximal segment. Supplied by LIMA to LAD. Distal to the insertion, there is a 60 to 70% diffuse disease. Small vessel distally. Free radial graft to OM1 is widely patent. Distal circumflex has a high-grade lesion, small vessel. SVG to D1 is occluded. Normal LV systolic function, mild posterior hypokinesis. Normal LVEDP. EF estimated at 55%.  Assessment:     ICD-10-CM   1. Coronary artery disease of native artery of native heart with stable angina pectoris ([place])  I25.118   2. Essential hypertension  I10   3. Controlled type 2 diabetes mellitus with insulin therapy ([place])  E11.9    Z79.4   4. Mixed hyperlipidemia  E78.2   5. CKD stage 3 due to type 2 diabetes mellitus ([place])  E11.22    N18.3     EKG 08/02/2018: Normal sinus rhythm at 63 bpm, left atrial abnormality. LBBB, no further analysis.   Recommendations:   Since last seen by me 3 months ago, patient is doing well has had significant  improvement in anginal symptoms.  He now only has mild left arm tightness with walking uphill with his trash can, otherwise is able to do all activities.  We will continue with current medications.  He prefers to take Ranexa 500 mg 2 tablets twice daily and will send new prescription for this.  He reports that his diabetes is much better controlled with last hemoglobin A1c of 7.2%.  He is closely working with Dr. Chalmers Cater regarding this.    Blood pressure has been stable, he reports recently was in the 352 systolic.  He did not have a way to check his blood pressure today. Question if ringing in his ears is related to medication. I have asked him to continue to monitor and let me know if persist or discuss with his PCP.  Lipids have been well controlled with Crestor, will continue the same.  He is to see Dr. Ashby Dawes next month for follow-up and will have labs performed for surveillance of chronic kidney disease that is been stable at stage III.  As patient is overall doing well, I will see him back in 6 months or sooner if problems.  Miquel Dunn, MSN, APRN, FNP-C [facility]. [place] Office: [phone] Fax: [phone]

## 2019-05-15 ENCOUNTER — Other Ambulatory Visit: Payer: Self-pay

## 2019-05-15 ENCOUNTER — Ambulatory Visit (INDEPENDENT_AMBULATORY_CARE_PROVIDER_SITE_OTHER): Payer: Medicare Other | Admitting: Cardiology

## 2019-05-15 ENCOUNTER — Encounter: Payer: Self-pay | Admitting: Cardiology

## 2019-05-15 VITALS — Ht 74.0 in | Wt 177.0 lb

## 2019-05-15 DIAGNOSIS — E782 Mixed hyperlipidemia: Secondary | ICD-10-CM | POA: Diagnosis not present

## 2019-05-15 DIAGNOSIS — I25118 Atherosclerotic heart disease of native coronary artery with other forms of angina pectoris: Secondary | ICD-10-CM | POA: Diagnosis not present

## 2019-05-15 DIAGNOSIS — E1122 Type 2 diabetes mellitus with diabetic chronic kidney disease: Secondary | ICD-10-CM

## 2019-05-15 DIAGNOSIS — N183 Chronic kidney disease, stage 3 unspecified: Secondary | ICD-10-CM

## 2019-05-15 DIAGNOSIS — I10 Essential (primary) hypertension: Secondary | ICD-10-CM

## 2019-05-15 DIAGNOSIS — E119 Type 2 diabetes mellitus without complications: Secondary | ICD-10-CM | POA: Diagnosis not present

## 2019-05-15 DIAGNOSIS — H9313 Tinnitus, bilateral: Secondary | ICD-10-CM

## 2019-05-15 DIAGNOSIS — Z794 Long term (current) use of insulin: Secondary | ICD-10-CM

## 2019-05-15 MED ORDER — RANOLAZINE ER 500 MG PO TB12: 1000.0000 mg | ORAL_TABLET | Freq: Two times a day (BID) | ORAL | 5 refills | Status: DC

## 2019-05-17 ENCOUNTER — Other Ambulatory Visit: Payer: Self-pay

## 2019-05-29 ENCOUNTER — Other Ambulatory Visit: Payer: Self-pay

## 2019-05-29 DIAGNOSIS — I25118 Atherosclerotic heart disease of native coronary artery with other forms of angina pectoris: Secondary | ICD-10-CM

## 2019-05-29 MED ORDER — RANOLAZINE ER 500 MG PO TB12: 1000.0000 mg | ORAL_TABLET | Freq: Two times a day (BID) | ORAL | 5 refills | Status: DC

## 2019-06-05 DIAGNOSIS — E1165 Type 2 diabetes mellitus with hyperglycemia: Secondary | ICD-10-CM | POA: Diagnosis not present

## 2019-06-05 DIAGNOSIS — E782 Mixed hyperlipidemia: Secondary | ICD-10-CM | POA: Diagnosis not present

## 2019-06-05 DIAGNOSIS — E1121 Type 2 diabetes mellitus with diabetic nephropathy: Secondary | ICD-10-CM | POA: Diagnosis not present

## 2019-06-05 DIAGNOSIS — I2510 Atherosclerotic heart disease of native coronary artery without angina pectoris: Secondary | ICD-10-CM | POA: Diagnosis not present

## 2019-06-12 DIAGNOSIS — E1121 Type 2 diabetes mellitus with diabetic nephropathy: Secondary | ICD-10-CM | POA: Diagnosis not present

## 2019-06-12 DIAGNOSIS — I129 Hypertensive chronic kidney disease with stage 1 through stage 4 chronic kidney disease, or unspecified chronic kidney disease: Secondary | ICD-10-CM | POA: Diagnosis not present

## 2019-06-12 DIAGNOSIS — Z23 Encounter for immunization: Secondary | ICD-10-CM | POA: Diagnosis not present

## 2019-06-12 DIAGNOSIS — E782 Mixed hyperlipidemia: Secondary | ICD-10-CM | POA: Diagnosis not present

## 2019-06-12 DIAGNOSIS — I2510 Atherosclerotic heart disease of native coronary artery without angina pectoris: Secondary | ICD-10-CM | POA: Diagnosis not present

## 2019-06-12 DIAGNOSIS — E1165 Type 2 diabetes mellitus with hyperglycemia: Secondary | ICD-10-CM | POA: Diagnosis not present

## 2019-06-22 ENCOUNTER — Other Ambulatory Visit: Payer: Self-pay | Admitting: Cardiology

## 2019-07-17 DIAGNOSIS — Z23 Encounter for immunization: Secondary | ICD-10-CM | POA: Diagnosis not present

## 2019-08-09 ENCOUNTER — Other Ambulatory Visit: Payer: Self-pay

## 2019-08-17 DIAGNOSIS — H524 Presbyopia: Secondary | ICD-10-CM | POA: Diagnosis not present

## 2019-08-17 DIAGNOSIS — H2513 Age-related nuclear cataract, bilateral: Secondary | ICD-10-CM | POA: Diagnosis not present

## 2019-08-17 DIAGNOSIS — E119 Type 2 diabetes mellitus without complications: Secondary | ICD-10-CM | POA: Diagnosis not present

## 2019-08-21 DIAGNOSIS — I2510 Atherosclerotic heart disease of native coronary artery without angina pectoris: Secondary | ICD-10-CM | POA: Diagnosis not present

## 2019-08-21 DIAGNOSIS — R5383 Other fatigue: Secondary | ICD-10-CM | POA: Diagnosis not present

## 2019-08-21 DIAGNOSIS — Z0000 Encounter for general adult medical examination without abnormal findings: Secondary | ICD-10-CM | POA: Diagnosis not present

## 2019-08-21 DIAGNOSIS — I129 Hypertensive chronic kidney disease with stage 1 through stage 4 chronic kidney disease, or unspecified chronic kidney disease: Secondary | ICD-10-CM | POA: Diagnosis not present

## 2019-08-21 DIAGNOSIS — Z7189 Other specified counseling: Secondary | ICD-10-CM | POA: Diagnosis not present

## 2019-08-21 DIAGNOSIS — E1165 Type 2 diabetes mellitus with hyperglycemia: Secondary | ICD-10-CM | POA: Diagnosis not present

## 2019-08-21 DIAGNOSIS — E782 Mixed hyperlipidemia: Secondary | ICD-10-CM | POA: Diagnosis not present

## 2019-08-23 DIAGNOSIS — M5442 Lumbago with sciatica, left side: Secondary | ICD-10-CM | POA: Diagnosis not present

## 2019-08-23 DIAGNOSIS — M5441 Lumbago with sciatica, right side: Secondary | ICD-10-CM | POA: Diagnosis not present

## 2019-08-23 DIAGNOSIS — Z7189 Other specified counseling: Secondary | ICD-10-CM | POA: Diagnosis not present

## 2019-08-28 ENCOUNTER — Ambulatory Visit: Payer: Medicare Other | Admitting: Podiatry

## 2019-08-28 ENCOUNTER — Other Ambulatory Visit: Payer: Self-pay

## 2019-08-28 ENCOUNTER — Encounter: Payer: Self-pay | Admitting: Podiatry

## 2019-08-28 DIAGNOSIS — I129 Hypertensive chronic kidney disease with stage 1 through stage 4 chronic kidney disease, or unspecified chronic kidney disease: Secondary | ICD-10-CM | POA: Diagnosis not present

## 2019-08-28 DIAGNOSIS — M79676 Pain in unspecified toe(s): Secondary | ICD-10-CM

## 2019-08-28 DIAGNOSIS — B351 Tinea unguium: Secondary | ICD-10-CM

## 2019-08-28 DIAGNOSIS — E119 Type 2 diabetes mellitus without complications: Secondary | ICD-10-CM | POA: Diagnosis not present

## 2019-08-28 DIAGNOSIS — E1165 Type 2 diabetes mellitus with hyperglycemia: Secondary | ICD-10-CM | POA: Diagnosis not present

## 2019-08-28 DIAGNOSIS — Q828 Other specified congenital malformations of skin: Secondary | ICD-10-CM | POA: Diagnosis not present

## 2019-08-28 DIAGNOSIS — Z0000 Encounter for general adult medical examination without abnormal findings: Secondary | ICD-10-CM | POA: Diagnosis not present

## 2019-08-28 DIAGNOSIS — E1121 Type 2 diabetes mellitus with diabetic nephropathy: Secondary | ICD-10-CM | POA: Diagnosis not present

## 2019-08-29 NOTE — Progress Notes (Signed)
He presents today with chief complaint of painfully elongated toenails and calluses.  Objective: Vital signs are stable he is alert and oriented x3.  Pulses are palpable.  Toenails are long thick yellow dystrophic clinically mycotic painful palpation as well as debridement.  Assessment pain limb secondary onychomycosis and porokeratosis.  Plan: Debridement of porokeratotic lesion and debridement of toenails 1 through 5 bilateral.

## 2019-09-20 DIAGNOSIS — E1165 Type 2 diabetes mellitus with hyperglycemia: Secondary | ICD-10-CM | POA: Diagnosis not present

## 2019-09-21 ENCOUNTER — Other Ambulatory Visit: Payer: Self-pay

## 2019-09-21 DIAGNOSIS — E782 Mixed hyperlipidemia: Secondary | ICD-10-CM

## 2019-09-21 MED ORDER — ROSUVASTATIN CALCIUM 20 MG PO TABS: 20.0000 mg | ORAL_TABLET | Freq: Every evening | ORAL | 3 refills | Status: DC

## 2019-10-16 ENCOUNTER — Other Ambulatory Visit: Payer: Self-pay | Admitting: Cardiology

## 2019-10-16 DIAGNOSIS — I25118 Atherosclerotic heart disease of native coronary artery with other forms of angina pectoris: Secondary | ICD-10-CM

## 2019-10-16 NOTE — Telephone Encounter (Signed)
Can I send this?

## 2019-10-25 ENCOUNTER — Other Ambulatory Visit: Payer: Self-pay

## 2019-10-25 MED ORDER — AMLODIPINE BESYLATE 5 MG PO TABS: 5.0000 mg | ORAL_TABLET | Freq: Every day | ORAL | 3 refills | Status: DC

## 2019-11-12 NOTE — Progress Notes (Signed)
Primary Physician:  Merrilee Seashore, MD   [name], male    DOB: [birth year], 73 y.o.   MRN: [id number]  Subjective:    Chief Complaint  Patient presents with  . Coronary Artery Disease  . Hypertension  . Hyperlipidemia    HPI: Jesus Young  is a 73 y.o. male  with known coronary artery disease s/p CABG in 2012, left bundle branch block that was noted 2014, hypertension, diabetes, hyperlipidemia and CKD stage 3.  Due tocomplaints of left arm pain with exertion, underwent lexiscan nuclear stress test on 08/07/2018 considered high risk study due to LVEF; however, on echocardiogram had normal LVEF. No significant changes were noted compared to the 2014, but in view of continued symptoms despite medical management, he underwent coronary angiogram on 12/05/2018 and patient was noted to have small vessel disease and microvascular disease.  Last seen 6 months ago with improvement in angina symptoms with Ranexa. He reports that he continues to feel well.   He is tolerating medications well. Continues to have occasional left arm tightness that is overall stable. Diabetes is improving, continues to follow Dr. Chalmers Cater.   Past Medical History:  Diagnosis Date  . Arthritis    back and knees  . Bronchitis    hx of 2015  . Cancer [facility])    prostate  . Cataract    immature on right  . Chronic back pain   . Constipation    takes Colace at bedtime  . Coronary artery disease    takes Plavix daily but is on hold for surgery  . Diabetes mellitus without complication ([place])    takes Levemir and Humalog daily  . Dizziness   . Fall    hx of with injury to left shoulder   . GERD (gastroesophageal reflux disease)    takes Protonix daily and Zantac  . H/O urinary frequency   . Hemorrhoids   . History of bladder infections   . History of kidney stones   . Hyperlipidemia    takes Zocor every evening  . Hypertension    takes Metoprolol daily  . Hypothyroidism     takes Synthroid daily  . Myocardial infarction [facility])    many yrs before 2002  . Night muscle spasms    takes Robaxin 4 x day   . Nocturia     Past Surgical History:  Procedure Laterality Date  . CARDIAC CATHETERIZATION  05/17/02  . COLONOSCOPY    . CORONARY ANGIOPLASTY     x 1   . CORONARY ARTERY BYPASS GRAFT  2002   x 5  . eyelid surgery     . LEFT HEART CATH AND CORS/GRAFTS ANGIOGRAPHY N/A 12/05/2018   Procedure: LEFT HEART CATH AND CORS/GRAFTS ANGIOGRAPHY;  Surgeon: Adrian Prows, MD;  Location: [place] [facility];  Service: Cardiovascular;  Laterality: N/A;  . LUMBAR LAMINECTOMY/DECOMPRESSION MICRODISCECTOMY Bilateral 07/18/2013   Procedure: Bilateral Lumbar four-five Lumbar Laminotomy/Microdiskectomy;  Surgeon: Hosie Spangle, MD;  Location: [place] [facility];  Service: Neurosurgery;  Laterality: Bilateral;  Bilateral Lumbar four-five Lumbar Laminotomy/Microdiskectomy  . NEPHROLITHOTOMY Left 06/23/2015   Procedure: NEPHROLITHOTOMY PERCUTANEOUS;  Surgeon: [name], MD;  Location: [facility];  Service: Urology;  Laterality: Left;  . PROSTATECTOMY  10/2009  . skin spots removed from back -precancerous    . TONSILLECTOMY      Social History   Socioeconomic History  . Marital status: Married    Spouse name: Not on file  . Number of  children: 0  . Years of education: Not on file  . Highest education level: Not on file  Occupational History  . Not on file  Tobacco Use  . Smoking status: Never Smoker  . Smokeless tobacco: Never Used  Substance and Sexual Activity  . Alcohol use: No  . Drug use: No  . Sexual activity: Not Currently  Other Topics Concern  . Not on file  Social History Narrative  . Not on file   Social Determinants of Health   Financial Resource Strain:   . Difficulty of Paying Living Expenses: Not on file  Food Insecurity:   . Worried About Charity fundraiser in the Last Year: Not on file  . Ran Out of Food in the Last Year: Not on file  Transportation  Needs:   . Lack of Transportation (Medical): Not on file  . Lack of Transportation (Non-Medical): Not on file  Physical Activity:   . Days of Exercise per Week: Not on file  . Minutes of Exercise per Session: Not on file  Stress:   . Feeling of Stress : Not on file  Social Connections:   . Frequency of Communication with Friends and Family: Not on file  . Frequency of Social Gatherings with Friends and Family: Not on file  . Attends Religious Services: Not on file  . Active Member of Clubs or Organizations: Not on file  . Attends Archivist Meetings: Not on file  . Marital Status: Not on file  Intimate Partner Violence:   . Fear of Current or Ex-Partner: Not on file  . Emotionally Abused: Not on file  . Physically Abused: Not on file  . Sexually Abused: Not on file    Review of Systems  Constitution: Negative for decreased appetite, malaise/fatigue, weight gain and weight loss.  Eyes: Negative for visual disturbance.  Cardiovascular: Negative for chest pain (continued left arm pain, but overall improved from before), claudication, dyspnea on exertion, leg swelling, orthopnea, palpitations and syncope.       Occasional left arm pain  Respiratory: Negative for hemoptysis and wheezing.   Endocrine: Negative for cold intolerance and heat intolerance.  Hematologic/Lymphatic: Does not bruise/bleed easily.  Skin: Negative for nail changes and rash (right groin).  Musculoskeletal: Negative for muscle weakness and myalgias.  Gastrointestinal: Positive for change in bowel habit. Negative for abdominal pain, nausea and vomiting.  Neurological: Positive for headaches. Negative for difficulty with concentration, dizziness and focal weakness.  Psychiatric/Behavioral: Negative for altered mental status and suicidal ideas.  All other systems reviewed and are negative.     Objective:  Blood pressure 127/72, pulse 71, height 6\' 2"  (1.88 m), weight 181 lb 3.2 oz (82.2 kg), SpO2 99 %.  Body mass index is 23.26 kg/m.     Physical Exam  Constitutional: He is oriented to person, place, and time. Vital signs are normal. He appears well-developed and well-nourished.  HENT:  Head: Normocephalic and atraumatic.  Cardiovascular: Normal rate, regular rhythm, normal heart sounds and intact distal pulses.  Pulmonary/Chest: Effort normal and breath sounds normal. No accessory muscle usage. No respiratory distress.  Abdominal: Soft. Bowel sounds are normal.  Musculoskeletal:        General: Normal range of motion.     Cervical back: Normal range of motion.  Neurological: He is alert and oriented to person, place, and time.  Skin: Skin is warm, dry and intact.     Vitals reviewed.  Radiology: No results found.  Laboratory examination:  CMP Latest Ref Rng & Units 10/22/2017 11/21/2016 10/30/2015  Glucose 65 - 99 mg/dL 119(H) 226(H) 113(H)  BUN 6 - 20 mg/dL 21(H) 36(H) 26(H)  Creatinine 0.61 - 1.24 mg/dL 1.42(H) 1.57(H) 1.68(H)  Sodium 135 - 145 mmol/L 140 137 138  Potassium 3.5 - 5.1 mmol/L 4.0 5.1 4.4  Chloride 101 - 111 mmol/L 106 102 106  CO2 22 - 32 mmol/L 25 24 26   Calcium 8.9 - 10.3 mg/dL 9.2 8.8(L) 9.2  Total Protein 6.5 - 8.1 g/dL - 6.6 -  Total Bilirubin 0.3 - 1.2 mg/dL - 0.9 -  Alkaline Phos 38 - 126 U/L - 46 -  AST 15 - 41 U/L - 37 -  ALT 17 - 63 U/L - 47 -   CBC Latest Ref Rng & Units 12/06/2018 12/05/2018 10/22/2017  WBC 3.8 - 10.6 K/uL - - 6.2  Hemoglobin 13.0 - 17.0 g/dL 14.2 14.3 17.2  Hematocrit 39.0 - 52.0 % 43.7 44.9 52.4(H)  Platelets 150 - 440 K/uL - - 165   Lipid Panel  No results found for: CHOL, TRIG, HDL, CHOLHDL, VLDL, LDLCALC, LDLDIRECT HEMOGLOBIN A1C No results found for: HGBA1C, MPG TSH No results for input(s): TSH in the last 8760 hours.  PRN Meds:. Medications Discontinued During This Encounter  Medication Reason  . tiZANidine (ZANAFLEX) 2 MG tablet Error   Current Meds  Medication Sig  . acetaminophen (TYLENOL) 650 MG CR  tablet Take 1,300 mg by mouth 3 (three) times daily. PRN  . amLODipine (NORVASC) 5 MG tablet Take 1 tablet (5 mg total) by mouth daily.  Marland Kitchen aspirin EC 81 MG tablet Take 81 mg by mouth at bedtime.   . B-D ULTRAFINE III SHORT PEN 31G X 8 MM MISC USE UTD  . busPIRone (BUSPAR) 10 MG tablet Take 10 mg by mouth 2 (two) times daily.   . clopidogrel (PLAVIX) 75 MG tablet Take 75 mg by mouth at bedtime.  . docusate sodium (COLACE) 100 MG capsule Take 400 mg by mouth at bedtime.   . famotidine (PEPCID) 20 MG tablet 2 (two) times a day.  . insulin detemir (LEVEMIR) 100 UNIT/ML injection Inject 0.16 mLs (16 Units total) into the skin 2 (two) times daily. (Patient taking differently: Inject 18 Units into the skin 2 (two) times daily. )  . insulin lispro (HUMALOG) 100 UNIT/ML injection Inject 0.05-0.09 mLs (5-9 Units total) into the skin 2 (two) times daily. 5 units at lunch, and 9 units after dinner. (Patient taking differently: Inject 5-11 Units into the skin 2 (two) times daily. 5-15 units as needed  (sliding scale insulin))  . isosorbide mononitrate (IMDUR) 120 MG 24 hr tablet TAKE 1 TABLET BY MOUTH EVERY DAY  . levothyroxine (SYNTHROID, LEVOTHROID) 100 MCG tablet Take 100 mcg by mouth See admin instructions. Take 1 tablet (100 mcg) by mouth daily in the morning, except on Sundays  . metoprolol (LOPRESSOR) 50 MG tablet Take 50 mg by mouth 2 (two) times daily.   . nitroGLYCERIN (NITROSTAT) 0.4 MG SL tablet Place 1 tablet under the tongue as needed.  . ONE TOUCH ULTRA TEST test strip   . pantoprazole (PROTONIX) 40 MG tablet Take 40 mg by mouth at bedtime.   . ranolazine (RANEXA) 500 MG 12 hr tablet Take 2 tablets by mouth twice daily  . rosuvastatin (CRESTOR) 20 MG tablet Take 1 tablet (20 mg total) by mouth every evening.    Cardiac Studies:   Echocardiogram  08/19/2018: Left ventricle cavity is normal in size.  Abnormal septal wall motion due to left bundle branch block. Doppler evidence of grade I  (impaired) diastolic dysfunction, normal LAP. Low normal LV systolic function. Visual EF is 50-55%. Calculated EF 58%. Structurally normal tricuspid valve with trace regurgitation. Structurally normal pulmonic valve with trace regurgitation. Compared to the study done on 01/22/2013, no significant change  Lexiscan myoview stress test 08/07/2018: 1. Lexiscan stress test was performed. Exercise capacity was not assessed. No stress symptoms reported. Peak effect blood pressure was 164/88 mmHg. The resting and stress electrocardiogram demonstrated normal sinus rhythm and LBBB. Stress EKG is non diagnostic for ischemia as it is a pharmacologic stress. 2. The overall quality of the study is good. Left ventricular cavity is noted to be normal on the rest and stress studies. Gated SPECT images reveal normal myocardial thickening and wall motion. The left ventricular ejection fraction was calculated at 32%, although visually appears near normal. Recommend correlation with echocardiogram. Mid inferior perfusion defect seen worse on rest images without corresponding wall motion abnormality likely represents tissue attenuation artifact. 3. High risk study due to low LVEF. Clinical and echocardiographic correlation recomme  Coronary angiogram 12/05/2018: Left main diffusely diseased and occluded proximal LAD and circumflex pulmonary artery. RCA occluded in the proximal segment. SVG to RCA is patent, diffusely diseased PL branch with high-grade stenosis of 90 to 99% and PDA has about a 60 to 70% mid stenosis. Small vessels. LAD is diffusely diseased. It is occluded in the proximal segment. Supplied by LIMA to LAD. Distal to the insertion, there is a 60 to 70% diffuse disease. Small vessel distally. Free radial graft to OM1 is widely patent. Distal circumflex has a high-grade lesion, small vessel. SVG to D1 is occluded. Normal LV systolic function, mild posterior hypokinesis. Normal LVEDP. EF estimated at  55%.  Assessment:     ICD-10-CM   1. Coronary artery disease of native artery of native heart with stable angina pectoris ([place])  I25.118 EKG 12-Lead  2. Essential hypertension  I10   3. Mixed hyperlipidemia  E78.2   4. CKD stage 3 due to type 2 diabetes mellitus ([place])  E11.22    N18.30     EKG 11/13/2019: Normal sinus rhythm at 70 bpm, left atrial abnormality. LBBB, no further analysis.   Recommendations:   Patient is here in 31-month office visit and follow-up for CAD.  He continues to have stable symptoms of angina that is overall significantly improved since being on medical therapy.  He overall feels well.  We will continue the same.  Continues to be on dual antiplatelet therapy, could consider discontinuing Plavix after his next office visit.  Blood pressure is well controlled.  He reports that he is to have labs performed in March with his PCP for follow-up on hyperlipidemia and chronic kidney disease.  Will request results be sent to [place] for our records.  No changes are noted to EKG. no clinical evidence of decompensated heart failure.  Encouraged him to remain active.  He is being followed by endocrinology for management of diabetes that he states is improving and will continue to follow with him.  As he is overall doing well from a cardiac standpoint, I will see him back in 1 year or sooner if problems.  Miquel Dunn, MSN, APRN, FNP-C [facility]. [employer] Office: [phone] Fax: [phone]

## 2019-11-13 ENCOUNTER — Encounter: Payer: Self-pay | Admitting: Cardiology

## 2019-11-13 ENCOUNTER — Ambulatory Visit: Payer: Medicare Other | Admitting: Cardiology

## 2019-11-13 ENCOUNTER — Other Ambulatory Visit: Payer: Self-pay

## 2019-11-13 VITALS — BP 127/72 | HR 71 | Ht 74.0 in | Wt 181.2 lb

## 2019-11-13 DIAGNOSIS — I25118 Atherosclerotic heart disease of native coronary artery with other forms of angina pectoris: Secondary | ICD-10-CM

## 2019-11-13 DIAGNOSIS — N183 Chronic kidney disease, stage 3 unspecified: Secondary | ICD-10-CM

## 2019-11-13 DIAGNOSIS — I129 Hypertensive chronic kidney disease with stage 1 through stage 4 chronic kidney disease, or unspecified chronic kidney disease: Secondary | ICD-10-CM

## 2019-11-13 DIAGNOSIS — E1122 Type 2 diabetes mellitus with diabetic chronic kidney disease: Secondary | ICD-10-CM | POA: Diagnosis not present

## 2019-11-13 DIAGNOSIS — E782 Mixed hyperlipidemia: Secondary | ICD-10-CM | POA: Diagnosis not present

## 2019-11-13 DIAGNOSIS — I10 Essential (primary) hypertension: Secondary | ICD-10-CM

## 2019-11-27 ENCOUNTER — Other Ambulatory Visit: Payer: Self-pay

## 2019-11-27 ENCOUNTER — Ambulatory Visit: Payer: Medicare Other | Admitting: Podiatry

## 2019-11-27 ENCOUNTER — Encounter: Payer: Self-pay | Admitting: Podiatry

## 2019-11-27 DIAGNOSIS — M79676 Pain in unspecified toe(s): Secondary | ICD-10-CM

## 2019-11-27 DIAGNOSIS — Q828 Other specified congenital malformations of skin: Secondary | ICD-10-CM | POA: Diagnosis not present

## 2019-11-27 DIAGNOSIS — B351 Tinea unguium: Secondary | ICD-10-CM

## 2019-11-27 DIAGNOSIS — E119 Type 2 diabetes mellitus without complications: Secondary | ICD-10-CM

## 2019-11-28 NOTE — Progress Notes (Signed)
He presents today chief complaint of painful corns and calluses bilateral.  Objective: Toenails are long thick yellow dystrophic-like mycotic painful on palpation reactive hyperkeratotic lesion subfifth metatarsal head of the left foot.  Assessment: Pain in limb secondary to onychomycosis and porokeratotic lesion left.  Plan: Debridement of toenails 1 through 5 bilateral and porokeratotic lesion left.

## 2019-11-30 ENCOUNTER — Other Ambulatory Visit: Payer: Self-pay | Admitting: Cardiology

## 2019-11-30 ENCOUNTER — Other Ambulatory Visit: Payer: Self-pay

## 2019-11-30 DIAGNOSIS — I25118 Atherosclerotic heart disease of native coronary artery with other forms of angina pectoris: Secondary | ICD-10-CM

## 2019-11-30 MED ORDER — RANOLAZINE ER 500 MG PO TB12: 1000.0000 mg | ORAL_TABLET | Freq: Two times a day (BID) | ORAL | 3 refills | Status: DC

## 2019-12-15 ENCOUNTER — Other Ambulatory Visit: Payer: Self-pay | Admitting: Cardiology

## 2019-12-15 DIAGNOSIS — E782 Mixed hyperlipidemia: Secondary | ICD-10-CM

## 2019-12-25 ENCOUNTER — Ambulatory Visit: Payer: Medicare Other | Attending: Internal Medicine

## 2019-12-27 ENCOUNTER — Ambulatory Visit: Payer: Medicare Other

## 2019-12-28 ENCOUNTER — Telehealth: Payer: Self-pay

## 2019-12-28 NOTE — Telephone Encounter (Signed)
Patient asking if he should stop aspirin before getting COVID vaccine. I advised patient to continue to take aspirin.

## 2020-01-16 DIAGNOSIS — S30860A Insect bite (nonvenomous) of lower back and pelvis, initial encounter: Secondary | ICD-10-CM | POA: Diagnosis not present

## 2020-01-17 DIAGNOSIS — I2510 Atherosclerotic heart disease of native coronary artery without angina pectoris: Secondary | ICD-10-CM | POA: Diagnosis not present

## 2020-01-17 DIAGNOSIS — I129 Hypertensive chronic kidney disease with stage 1 through stage 4 chronic kidney disease, or unspecified chronic kidney disease: Secondary | ICD-10-CM | POA: Diagnosis not present

## 2020-01-17 DIAGNOSIS — E039 Hypothyroidism, unspecified: Secondary | ICD-10-CM | POA: Diagnosis not present

## 2020-01-17 DIAGNOSIS — E782 Mixed hyperlipidemia: Secondary | ICD-10-CM | POA: Diagnosis not present

## 2020-01-17 DIAGNOSIS — E1165 Type 2 diabetes mellitus with hyperglycemia: Secondary | ICD-10-CM | POA: Diagnosis not present

## 2020-01-24 DIAGNOSIS — E782 Mixed hyperlipidemia: Secondary | ICD-10-CM | POA: Diagnosis not present

## 2020-01-24 DIAGNOSIS — I129 Hypertensive chronic kidney disease with stage 1 through stage 4 chronic kidney disease, or unspecified chronic kidney disease: Secondary | ICD-10-CM | POA: Diagnosis not present

## 2020-01-24 DIAGNOSIS — E039 Hypothyroidism, unspecified: Secondary | ICD-10-CM | POA: Diagnosis not present

## 2020-01-24 DIAGNOSIS — E1165 Type 2 diabetes mellitus with hyperglycemia: Secondary | ICD-10-CM | POA: Diagnosis not present

## 2020-01-29 DIAGNOSIS — I129 Hypertensive chronic kidney disease with stage 1 through stage 4 chronic kidney disease, or unspecified chronic kidney disease: Secondary | ICD-10-CM | POA: Diagnosis not present

## 2020-01-29 DIAGNOSIS — T148XXA Other injury of unspecified body region, initial encounter: Secondary | ICD-10-CM | POA: Diagnosis not present

## 2020-01-29 DIAGNOSIS — E1165 Type 2 diabetes mellitus with hyperglycemia: Secondary | ICD-10-CM | POA: Diagnosis not present

## 2020-01-29 DIAGNOSIS — E1121 Type 2 diabetes mellitus with diabetic nephropathy: Secondary | ICD-10-CM | POA: Diagnosis not present

## 2020-01-29 DIAGNOSIS — E782 Mixed hyperlipidemia: Secondary | ICD-10-CM | POA: Diagnosis not present

## 2020-02-12 DIAGNOSIS — B356 Tinea cruris: Secondary | ICD-10-CM | POA: Diagnosis not present

## 2020-02-21 ENCOUNTER — Other Ambulatory Visit: Payer: Self-pay

## 2020-02-21 DIAGNOSIS — R1909 Other intra-abdominal and pelvic swelling, mass and lump: Secondary | ICD-10-CM | POA: Diagnosis not present

## 2020-02-21 MED ORDER — ISOSORBIDE MONONITRATE ER 120 MG PO TB24: 120.0000 mg | ORAL_TABLET | Freq: Every day | ORAL | 3 refills | Status: DC

## 2020-02-26 ENCOUNTER — Ambulatory Visit: Payer: Medicare Other | Admitting: Podiatry

## 2020-02-26 ENCOUNTER — Other Ambulatory Visit: Payer: Self-pay

## 2020-02-26 DIAGNOSIS — B351 Tinea unguium: Secondary | ICD-10-CM

## 2020-02-26 DIAGNOSIS — E119 Type 2 diabetes mellitus without complications: Secondary | ICD-10-CM | POA: Diagnosis not present

## 2020-02-26 DIAGNOSIS — Q828 Other specified congenital malformations of skin: Secondary | ICD-10-CM | POA: Diagnosis not present

## 2020-02-26 DIAGNOSIS — M79676 Pain in unspecified toe(s): Secondary | ICD-10-CM | POA: Diagnosis not present

## 2020-02-26 NOTE — Progress Notes (Signed)
He presents today chief complaint of painfully elongated toenails and a painful hyperkeratotic lesion subfifth metatarsophalangeal joint of the left foot.  Objective: Vital signs are stable he is alert oriented x3 pulses are palpable.  Toenails are long thick yellow dystrophic-like mycotic no open lesions or wounds porokeratotic lesion subfifth met left debrided today without incident.  Assessment: Diabetes mellitus without complications painful elongated toenails and calluses.  Plan: Debrided toenails 1 through 5 bilateral debrided reactive hyperkeratotic tissue.  Follow-up with him in 2 months

## 2020-03-14 ENCOUNTER — Other Ambulatory Visit: Payer: Self-pay | Admitting: Cardiology

## 2020-03-26 ENCOUNTER — Ambulatory Visit: Payer: Self-pay | Admitting: Surgery

## 2020-03-26 DIAGNOSIS — K4090 Unilateral inguinal hernia, without obstruction or gangrene, not specified as recurrent: Secondary | ICD-10-CM | POA: Diagnosis not present

## 2020-03-26 NOTE — H&P (Signed)
History of Present Illness Jesus Young. Jesus Oxley MD; 03/26/2020 5:31 PM) The patient is a 74 year old male who presents with an inguinal hernia. PCP - Dr. Ashby Young Cardiology - Dr. Einar Young  Reason for referral - left inguinal hernia  This is a 74 year old male with coronary artery disease chronically anticoagulated on Plavix and aspirin. Recently he was in the bathroom and looked at himself in the mirror and noticed a bulge in his left groin. He was not having any symptoms at that time. He is not sure whether the bulge is reducible. Since that time, he has paid more attention and does get occasional twinges of discomfort radiating down towards the base of his penis and his rectum. He denies any change in his bowel habits. Testicular swelling. No imaging has been performed.   Problem List/Past Medical Jesus Key K. Vanice Rappa, MD; 03/26/2020 5:31 PM) LEFT INGUINAL HERNIA (K40.90)  Past Surgical History (Jesus Young, [name]; 03/26/2020 3:11 PM) Colon Polyp Removal - Colonoscopy Spinal Surgery - Lower Back  Diagnostic Studies History (Jesus Young, [name]; 03/26/2020 3:11 PM) Colonoscopy 1-5 years ago  Allergies (Jesus Young, Young; 03/26/2020 3:12 PM) Lisinopril & Diet Manage Prod *ANTIHYPERTENSIVES* Tetanus Toxoid Adsorbed *TOXOIDS* clonazePAM *ANTICONVULSANTS* Allergies Reconciled  Medication History (Jesus Young, Young; 03/26/2020 3:14 PM) amLODIPine Besylate (5MG  Tablet, Oral) Active. BD Pen Needle Short U/F (31G X 8 MM Misc,) Active. busPIRone HCl (10MG  Tablet, Oral) Active. Clopidogrel Bisulfate (75MG  Tablet, Oral) Active. Famotidine (40MG  Tablet, Oral) Active. Insulin Lispro (1 Unit Dial) (100UNIT/ML Soln Pen-inj, Subcutaneous) Active. Levothyroxine Sodium (100MCG Tablet, Oral) Active. Levemir FlexTouch (100UNIT/ML Soln Pen-inj, Subcutaneous) Active. Metoprolol Tartrate (50MG  Tablet, Oral) Active. Pantoprazole Sodium (40MG  Tablet DR, Oral) Active. Rosuvastatin Calcium  (20MG  Tablet, Oral) Active. Probiotic (Oral) Specific strength unknown - Active. Stool Softener (Oral) Specific strength unknown - Active. Insulin Syringe (28G X 1/2"0.5 ML Misc,) Active. GlucaGen (1MG  For Solution, Injection) Active. FreeStyle Libre 14 Day Sensor Active. Medications Reconciled  Social History Jesus Young, Young; 03/26/2020 3:11 PM) Alcohol use Remotely quit alcohol use.  Family History Jesus Young, [name]; 03/26/2020 3:11 PM) Arthritis Jesus Young.  Other Problems Jesus Young. Jesus Whatley, MD; 03/26/2020 5:31 PM) Anxiety Disorder Arthritis Back Pain Diabetes Mellitus Gastroesophageal Reflux Disease Hypercholesterolemia Kidney Stone Prostate Cancer     Review of Systems (Jesus Young; 03/26/2020 3:11 PM) General Present- Fatigue and Weight Gain. Not Present- Appetite Loss, Chills, Fever, Night Sweats and Weight Loss. Skin Present- Dryness, New Lesions and Non-Healing Wounds. Not Present- Change in Wart/Mole, Hives, Jaundice, Rash and Ulcer. HEENT Present- Hearing Loss, Seasonal Allergies and Wears glasses/contact lenses. Not Present- Earache, Hoarseness, Nose Bleed, Oral Ulcers, Ringing in the Ears, Sinus Pain, Sore Throat, Visual Disturbances and Yellow Eyes. Respiratory Present- Chronic Cough and Snoring. Not Present- Bloody sputum, Difficulty Breathing and Wheezing. Breast Not Present- Breast Mass, Breast Pain, Nipple Discharge and Skin Changes. Cardiovascular Present- Chest Pain. Not Present- Difficulty Breathing Lying Down, Leg Cramps, Palpitations, Rapid Heart Rate, Shortness of Breath and Swelling of Extremities. Gastrointestinal Present- Change in Bowel Habits. Not Present- Abdominal Pain, Bloating, Bloody Stool, Chronic diarrhea, Constipation, Difficulty Swallowing, Excessive gas, Gets full quickly at meals, Hemorrhoids, Indigestion, Nausea, Rectal Pain and Vomiting. Male Genitourinary Present- Impotence. Not Present- Blood in Urine, Change in Urinary  Stream, Frequency, Nocturia, Painful Urination, Urgency and Urine Leakage. Musculoskeletal Present- Back Pain, Joint Pain and Joint Stiffness. Not Present- Muscle Pain, Muscle Weakness and Swelling of Extremities. Neurological Present- Decreased Memory. Not Present- Fainting, Headaches, Numbness, Seizures, Tingling, Tremor, Trouble walking and Weakness.  Psychiatric Present- Anxiety. Not Present- Bipolar, Change in Sleep Pattern, Depression, Fearful and Frequent crying. Endocrine Present- Hair Changes. Not Present- Cold Intolerance, Excessive Hunger, Heat Intolerance, Hot flashes and New Diabetes. Hematology Present- Blood Thinners, Easy Bruising and Gland problems. Not Present- Excessive bleeding, HIV and Persistent Infections.  Vitals (Jesus Young; 03/26/2020 3:14 PM) 03/26/2020 3:14 PM Weight: 198.13 lb Height: 74in Body Surface Area: 2.16 m Body Mass Index: 25.44 kg/m  Temp.: 97.82F  Pulse: 88 (Regular)  BP: 132/80(Sitting, Left Arm, Standard)        Physical Exam Jesus Key K. Hobie Kohles MD; 03/26/2020 5:31 PM)  The physical exam findings are as follows: Note:Constitutional: WDWN in NAD, conversant, no obvious deformities; resting comfortably Eyes: Pupils equal, round; sclera anicteric; moist conjunctiva; no lid lag HENT: Oral mucosa moist; good dentition Neck: No masses palpated, trachea midline; no thyromegaly Lungs: CTA bilaterally; normal respiratory effort CV: Regular rate and rhythm; no murmurs; extremities well-perfused with no edema Abd: +bowel sounds, soft, non-tender, no palpable organomegaly; no palpable hernias GU: Bilateral descended testes, no testicular masses, no sign of right inguinal hernia, visible reducible left inguinal hernia Musc: Normal gait; no apparent clubbing or cyanosis in extremities Lymphatic: No palpable cervical or axillary lymphadenopathy Skin: Warm, dry; no sign of jaundice Psychiatric - alert and oriented x 4; calm mood and  affect    Assessment & Plan Jesus Key K. Oather Muilenburg MD; 03/26/2020 5:32 PM)  LEFT INGUINAL HERNIA (K40.90)  Current Plans Schedule for Surgery - Left inguinal hernia repair with mesh. The surgical procedure has been discussed with the patient. Potential risks, benefits, alternative treatments, and expected outcomes have been explained. All of the patient's questions at this time have been answered. The likelihood of reaching the patient's treatment goal is good. The patient understand the proposed surgical procedure and wishes to proceed. Note:We will try to obtain cardiac clearance from Dr. Einar Young. Once he is cleared for surgery, we will plan a left inguinal hernia repair with mesh. We will check the patient at least 1 night in the hospital due to his comorbidities.  Jesus Young. Georgette Dover, MD, [facility]  General/ Trauma Surgery   03/26/2020 5:32 PM

## 2020-03-29 ENCOUNTER — Other Ambulatory Visit: Payer: Self-pay | Admitting: Cardiology

## 2020-03-29 DIAGNOSIS — I25118 Atherosclerotic heart disease of native coronary artery with other forms of angina pectoris: Secondary | ICD-10-CM

## 2020-04-03 ENCOUNTER — Encounter: Payer: Self-pay | Admitting: Cardiology

## 2020-04-10 ENCOUNTER — Encounter: Payer: Self-pay | Admitting: Cardiology

## 2020-04-29 ENCOUNTER — Ambulatory Visit: Payer: Medicare Other | Admitting: Podiatry

## 2020-04-29 NOTE — Progress Notes (Addendum)
Your procedure is scheduled on Tuesday, June 22nd.  Report to [facility] "A" at 1:45 P.M., and check in at the Admitting office.  Call this number if you have problems the morning of surgery:  [phone]  Call [phone] if you have any questions prior to your surgery date Monday-Friday 8am-4pm   Remember:  Do not eat after midnight the night before your surgery  You may drink clear liquids until 12:45 P.M. the afternoon of your surgery.   Clear liquids allowed are: Water, Non-Citrus Juices (without pulp), Carbonated Beverages, Clear Tea, Black Coffee Only, and Gatorade   Please complete your PRE-SURGERY G2 by 12:45 P.M. the day of surgery .  Please, if able, drink it in one setting. DO NOT SIP.   Take these medicines the morning of surgery with A SIP OF WATER  busPIRone (BUSPAR) isosorbide mononitrate (IMDUR)  levothyroxine (SYNTHROID, LEVOTHROID) metoprolol (LOPRESSOR) ranolazine (RANEXA) rosuvastatin (CRESTOR)   If needed - acetaminophen (TYLENOL), nitroGLYCERIN (NITROSTAT)    Follow your surgeon's instructions on when to stop Aspirin and clopidogrel (PLAVIX).  If no instructions were given by your surgeon then you will need to call the office to get those instructions.    As of today, STOP taking any Aspirin (unless otherwise instructed by your surgeon) and Aspirin containing products, Aleve, Naproxen, Ibuprofen, Motrin, Advil, Goody's, BC's, all herbal medications, fish oil, and all vitamins.        WHAT DO I DO ABOUT MY DIABETES MEDICATION?   -The night before surgery Take 9 units  of insulin detemir (LEVEMIR)   -The morning of surgery Take 9 units  of insulin detemir (LEVEMIR)   If your CBG is greater than 220 mg/dL, you may take  of your sliding scale (correction)/insulin lispro (HUMALOG)  dose of insulin  HOW TO MANAGE YOUR DIABETES BEFORE AND AFTER SURGERY  Why is it important to control my blood sugar before and after surgery? . Improving  blood sugar levels before and after surgery helps healing and can limit problems. . A way of improving blood sugar control is eating a healthy diet by: o  Eating less sugar and carbohydrates o  Increasing activity/exercise o  Talking with your doctor about reaching your blood sugar goals . High blood sugars (greater than 180 mg/dL) can raise your risk of infections and slow your recovery, so you will need to focus on controlling your diabetes during the weeks before surgery. . Make sure that the doctor who takes care of your diabetes knows about your planned surgery including the date and location.  How do I manage my blood sugar before surgery? . Check your blood sugar at least 4 times a day, starting 2 days before surgery, to make sure that the level is not too high or low. . Check your blood sugar the morning of your surgery when you wake up and every 2 hours until you get to the Short Stay unit. o If your blood sugar is less than 70 mg/dL, you will need to treat for low blood sugar: - Do not take insulin. - Treat a low blood sugar (less than 70 mg/dL) with  cup of clear juice (cranberry or apple), 4 glucose tablets, OR glucose gel. - Recheck blood sugar in 15 minutes after treatment (to make sure it is greater than 70 mg/dL). If your blood sugar is not greater than 70 mg/dL on recheck, call [phone] for further instructions. . Report your blood sugar to the short stay nurse when you  get to Short Stay.  . If you are admitted to the hospital after surgery: o Your blood sugar will be checked by the staff and you will probably be given insulin after surgery (instead of oral diabetes medicines) to make sure you have good blood sugar levels. o The goal for blood sugar control after surgery is 80-180 mg/dL.             Do not wear jewelry, make up, or nail polish            Do not wear lotions, powders, perfumes/colognes, or deodorant.            Do not shave 48 hours prior to surgery.  Men  may shave face and neck.            Do not bring valuables to the hospital.            [facility] is not responsible for any belongings or valuables.  Do NOT Smoke (Tobacco/Vapping) or drink Alcohol 24 hours prior to your procedure If you use a CPAP at night, you may bring all equipment for your overnight stay.   Contacts, glasses, dentures or bridgework may not be worn into surgery.      For patients admitted to the hospital, discharge time will be determined by your treatment team.   Patients discharged the day of surgery will not be allowed to drive home, and someone needs to stay with them for 24 hours.  Special instructions:   [place]- Preparing For Surgery  Before surgery, you can play an important role. Because skin is not sterile, your skin needs to be as free of germs as possible. You can reduce the number of germs on your skin by washing with CHG (chlorahexidine gluconate) Soap before surgery.  CHG is an antiseptic cleaner which kills germs and bonds with the skin to continue killing germs even after washing.    Oral Hygiene is also important to reduce your risk of infection.  Remember - BRUSH YOUR TEETH THE MORNING OF SURGERY WITH YOUR REGULAR TOOTHPASTE  Please do not use if you have an allergy to CHG or antibacterial soaps. If your skin becomes reddened/irritated stop using the CHG.  Do not shave (including legs and underarms) for at least 48 hours prior to first CHG shower. It is OK to shave your face.  Please follow these instructions carefully.   1. Shower the NIGHT BEFORE SURGERY and the MORNING OF SURGERY with CHG Soap.   2. If you chose to wash your hair, wash your hair first as usual with your normal shampoo.  3. After you shampoo, rinse your hair and body thoroughly to remove the shampoo.  4. Use CHG as you would any other liquid soap. You can apply CHG directly to the skin and wash gently with a scrungie or a clean washcloth.   5. Apply the CHG Soap to your  body ONLY FROM THE NECK DOWN.  Do not use on open wounds or open sores. Avoid contact with your eyes, ears, mouth and genitals (private parts). Wash Face and genitals (private parts)  with your normal soap.   6. Wash thoroughly, paying special attention to the area where your surgery will be performed.  7. Thoroughly rinse your body with warm water from the neck down.  8. DO NOT shower/wash with your normal soap after using and rinsing off the CHG Soap.  9. Pat yourself dry with a CLEAN TOWEL.  10. Wear CLEAN  PAJAMAS to bed the night before surgery, wear comfortable clothes the morning of surgery  11. Place CLEAN SHEETS on your bed the night of your first shower and DO NOT SLEEP WITH PETS.  Day of Surgery: Shower with CHG soap as instructed above.  Do not apply any deodorants/lotions.  Please wear clean clothes to the hospital/surgery center.   Remember to brush your teeth WITH YOUR REGULAR TOOTHPASTE.   Please read over the following fact sheets that you were given.

## 2020-04-30 ENCOUNTER — Encounter (HOSPITAL_COMMUNITY): Payer: Self-pay

## 2020-04-30 ENCOUNTER — Telehealth: Payer: Self-pay

## 2020-04-30 ENCOUNTER — Other Ambulatory Visit: Payer: Self-pay

## 2020-04-30 ENCOUNTER — Encounter (HOSPITAL_COMMUNITY)
Admission: RE | Admit: 2020-04-30 | Discharge: 2020-04-30 | Disposition: A | Discharge status: Home / Self Care | Payer: Medicare Other | Source: Ambulatory Visit | Attending: Surgery | Admitting: Surgery

## 2020-04-30 DIAGNOSIS — Z01812 Encounter for preprocedural laboratory examination: Secondary | ICD-10-CM | POA: Diagnosis not present

## 2020-04-30 HISTORY — DX: Anxiety disorder, unspecified: F41.9

## 2020-04-30 HISTORY — DX: Depression, unspecified: F32.A

## 2020-04-30 HISTORY — DX: Left bundle-branch block, unspecified: I44.7

## 2020-04-30 HISTORY — DX: Chronic kidney disease, unspecified: N18.9

## 2020-04-30 LAB — GLUCOSE, CAPILLARY: Glucose-Capillary: 130 mg/dL — ABNORMAL HIGH (ref 70–99)

## 2020-04-30 LAB — BASIC METABOLIC PANEL
Anion gap: 8 (ref 5–15)
BUN: 23 mg/dL (ref 8–23)
CO2: 22 mmol/L (ref 22–32)
Calcium: 9.1 mg/dL (ref 8.9–10.3)
Chloride: 106 mmol/L (ref 98–111)
Creatinine, Ser: 1.76 mg/dL — ABNORMAL HIGH (ref 0.61–1.24)
GFR calc Af Amer: 43 mL/min — ABNORMAL LOW (ref >60)
GFR calc non Af Amer: 38 mL/min — ABNORMAL LOW (ref >60)
Glucose, Bld: 143 mg/dL — ABNORMAL HIGH (ref 70–99)
Potassium: 4.6 mmol/L (ref 3.5–5.1)
Sodium: 136 mmol/L (ref 135–145)

## 2020-04-30 LAB — CBC
HCT: 51 % (ref 39.0–52.0)
Hemoglobin: 16 g/dL (ref 13.0–17.0)
MCH: 31.4 pg (ref 26.0–34.0)
MCHC: 31.4 g/dL (ref 30.0–36.0)
MCV: 100 fL (ref 80.0–100.0)
Platelets: 181 10*3/uL (ref 150–400)
RBC: 5.1 MIL/uL (ref 4.22–5.81)
RDW: 12.5 % (ref 11.5–15.5)
WBC: 6 10*3/uL (ref 4.0–10.5)
nRBC: 0 % (ref 0.0–0.2)

## 2020-04-30 NOTE — Progress Notes (Addendum)
Your procedure is scheduled on Tuesday, June 22nd.  Report to [facility] "[facility]" at 1:45 P.M., and check in at the Admitting office.  Call this number if you have problems the morning of surgery:  [phone]  Call [phone] if you have any questions prior to your surgery date Monday-Friday 8am-4pm   Remember:  Do not eat after midnight the night before your surgery  You may drink clear liquids until 12:45 P.M. the afternoon of your surgery.   Clear liquids allowed are: Water, Non-Citrus Juices (without pulp), Carbonated Beverages, Clear Tea, Black Coffee Only, and Gatorade     Take these medicines the morning of surgery with A SIP OF WATER   Amlodipine (Norvasc)  busPIRone (BUSPAR)  isosorbide mononitrate (IMDUR)   levothyroxine (SYNTHROID, LEVOTHROID)  metoprolol (LOPRESSOR)  ranolazine (RANEXA)  rosuvastatin (CRESTOR)    If needed - acetaminophen (TYLENOL), nitroGLYCERIN (NITROSTAT)    Follow your surgeon's instructions on when to stop Aspirin and clopidogrel (PLAVIX).  If no instructions were given by your surgeon then you will need to call the office to get those instructions.    As of today, STOP taking any Aspirin (unless otherwise instructed by your surgeon) and Aspirin containing products, Aleve, Naproxen, Ibuprofen, Motrin, Advil, Goody's, BC's, all herbal medications, fish oil, and all vitamins.        WHAT DO I DO ABOUT MY DIABETES MEDICATION?   -The night before surgery Take 9 units  of insulin detemir (LEVEMIR)  Do NOT take bedtime dose of Humalog  -The morning of surgery Take 9 units  of insulin detemir (LEVEMIR)   The morning of surgery: If your CBG is greater than 220 mg/dL, you may take  of your sliding scale (correction)/insulin lispro (HUMALOG)  dose of insulin  HOW TO MANAGE YOUR DIABETES BEFORE AND AFTER SURGERY  Why is it important to control my blood sugar before and after surgery? . Improving blood sugar levels before and after  surgery helps healing and can limit problems. . A way of improving blood sugar control is eating a healthy diet by: o  Eating less sugar and carbohydrates o  Increasing activity/exercise o  Talking with your doctor about reaching your blood sugar goals . High blood sugars (greater than 180 mg/dL) can raise your risk of infections and slow your recovery, so you will need to focus on controlling your diabetes during the weeks before surgery. . Make sure that the doctor who takes care of your diabetes knows about your planned surgery including the date and location.  How do I manage my blood sugar before surgery? . Check your blood sugar at least 4 times a day, starting 2 days before surgery, to make sure that the level is not too high or low. . Check your blood sugar the morning of your surgery when you wake up and every 2 hours until you get to the Short Stay unit. o If your blood sugar is less than 70 mg/dL, you will need to treat for low blood sugar: - Do not take insulin. - Treat a low blood sugar (less than 70 mg/dL) with  cup of clear juice (cranberry or apple), 4 glucose tablets, OR glucose gel. - Recheck blood sugar in 15 minutes after treatment (to make sure it is greater than 70 mg/dL). If your blood sugar is not greater than 70 mg/dL on recheck, call [phone] for further instructions. . Report your blood sugar to the short stay nurse when you get to Short Stay.  Marland Kitchen  If you are admitted to the hospital after surgery: o Your blood sugar will be checked by the staff and you will probably be given insulin after surgery (instead of oral diabetes medicines) to make sure you have good blood sugar levels. o The goal for blood sugar control after surgery is 80-180 mg/dL.             Do not wear jewelry, make up, or nail polish            Do not wear lotions, powders, perfumes/colognes, or deodorant.            Do not shave 48 hours prior to surgery.  Men may shave face and neck.             Do not bring valuables to the hospital.            [facility] is not responsible for any belongings or valuables.  Do NOT Smoke (Tobacco/Vapping) or drink Alcohol 24 hours prior to your procedure If you use a CPAP at night, you may bring all equipment for your overnight stay.   Contacts, glasses, dentures or bridgework may not be worn into surgery.      For patients admitted to the hospital, discharge time will be determined by your treatment team.   Patients discharged the day of surgery will not be allowed to drive home, and someone needs to stay with them for 24 hours.  Special instructions:   [place]- Preparing For Surgery  Before surgery, you can play an important role. Because skin is not sterile, your skin needs to be as free of germs as possible. You can reduce the number of germs on your skin by washing with CHG (chlorahexidine gluconate) Soap before surgery.  CHG is an antiseptic cleaner which kills germs and bonds with the skin to continue killing germs even after washing.    Oral Hygiene is also important to reduce your risk of infection.  Remember - BRUSH YOUR TEETH THE MORNING OF SURGERY WITH YOUR REGULAR TOOTHPASTE  Please do not use if you have an allergy to CHG or antibacterial soaps. If your skin becomes reddened/irritated stop using the CHG.  Do not shave (including legs and underarms) for at least 48 hours prior to first CHG shower. It is OK to shave your face.  Please follow these instructions carefully.   1. Shower the NIGHT BEFORE SURGERY and the MORNING OF SURGERY with CHG Soap.   2. If you chose to wash your hair, wash your hair first as usual with your normal shampoo.  3. After you shampoo, rinse your hair and body thoroughly to remove the shampoo.  4. Use CHG as you would any other liquid soap. You can apply CHG directly to the skin and wash gently with a scrungie or a clean washcloth.   5. Apply the CHG Soap to your body ONLY FROM THE NECK DOWN.  Do  not use on open wounds or open sores. Avoid contact with your eyes, ears, mouth and genitals (private parts). Wash Face and genitals (private parts)  with your normal soap.   6. Wash thoroughly, paying special attention to the area where your surgery will be performed.  7. Thoroughly rinse your body with warm water from the neck down.  8. DO NOT shower/wash with your normal soap after using and rinsing off the CHG Soap.  9. Pat yourself dry with a CLEAN TOWEL.  10. Wear CLEAN PAJAMAS to bed the night before  surgery, wear comfortable clothes the morning of surgery  11. Place CLEAN SHEETS on your bed the night of your first shower and DO NOT SLEEP WITH PETS.  Day of Surgery: Shower with CHG soap as instructed above.  Do not apply any deodorants/lotions.  Please wear clean clothes to the hospital/surgery center.   Remember to brush your teeth WITH YOUR REGULAR TOOTHPASTE.   Please read over the following fact sheets that you were given.

## 2020-04-30 NOTE — Telephone Encounter (Signed)
I looked into the letter I sent. Please do so going forward. Only plavix to be stopped. Also if you told me what surgery, I could have replied without looking. JG

## 2020-04-30 NOTE — Telephone Encounter (Signed)
Pt will stop plavix tomorrow for surgery on 6/22. Wants to know if ok to continue aspirin or should he stop as well.

## 2020-04-30 NOTE — Progress Notes (Signed)
PCP - Ashby Dawes Cardiologist - [place]  Patient concerned with diabetic medication instructions given.  Patient advised to contact Dr. Chalmers Cater for instructions on what to take prior to surgery. Jeneen Rinks notified, and suggested patient call and follow up with Dr. Chalmers Cater.  Patient informed.  Chest x-ray - n/a EKG - 11-13-19 Cardiac Cath - 12-05-18  DM - Type 2 Fasting Blood Sugar - varies (per patient)   Blood Thinner Instructions: Stop plavix 5 days prior to surgery, per Dr. Georgette Dover Aspirin Instructions: Follow your surgeon's instructions on when to stop ASA  ERAS Protcol - yes, no drink ordered  COVID TEST- Friday, 05-02-20   Anesthesia review: yes, heart history  Patient denies shortness of breath, fever, cough and chest pain at PAT appointment   All instructions explained to the patient, with a verbal understanding of the material. Patient agrees to go over the instructions while at home for a better understanding. Patient also instructed to self quarantine after being tested for COVID-19. The opportunity to ask questions was provided.

## 2020-04-30 NOTE — Telephone Encounter (Signed)
Please review

## 2020-05-01 ENCOUNTER — Encounter (HOSPITAL_COMMUNITY): Payer: Self-pay

## 2020-05-01 LAB — HEMOGLOBIN A1C
Hgb A1c MFr Bld: 6.9 % — ABNORMAL HIGH (ref 4.8–5.6)
Mean Plasma Glucose: 151 mg/dL

## 2020-05-01 NOTE — Anesthesia Preprocedure Evaluation (Addendum)
Anesthesia Evaluation  Patient identified by MRN, date of birth, ID band Patient awake    Reviewed: Allergy & Precautions, NPO status , Patient's Chart, lab work & pertinent test results  Airway Mallampati: II  TM Distance: >3 FB     Dental   Pulmonary    breath sounds clear to auscultation       Cardiovascular hypertension, + angina + CAD and + Past MI  + dysrhythmias  Rhythm:Regular Rate:Normal     Neuro/Psych    GI/Hepatic Neg liver ROS, GERD  ,  Endo/Other  diabetesHypothyroidism   Renal/GU Renal disease     Musculoskeletal   Abdominal   Peds  Hematology   Anesthesia Other Findings   Reproductive/Obstetrics                             Anesthesia Physical Anesthesia Plan  ASA: III  Anesthesia Plan: General   Post-op Pain Management:  Regional for Post-op pain   Induction: Intravenous  PONV Risk Score and Plan: 2 and Ondansetron, Dexamethasone and Midazolam  Airway Management Planned: Oral ETT  Additional Equipment:   Intra-op Plan:   Post-operative Plan: Extubation in OR  Informed Consent: I have reviewed the patients History and Physical, chart, labs and discussed the procedure including the risks, benefits and alternatives for the proposed anesthesia with the patient or authorized representative who has indicated his/her understanding and acceptance.     Dental advisory given  Plan Discussed with: CRNA and Anesthesiologist  Anesthesia Plan Comments: (PAT note written by Myra Gianotti, PA-C. )       Anesthesia Quick Evaluation

## 2020-05-01 NOTE — Progress Notes (Addendum)
Anesthesia Chart Review:  Case: [id number] Date/Time: 05/06/20 1530   Procedure: LEFT INGUINAL HERNIA REPAIR WITH MESH (Left )   Anesthesia type: General   Pre-op diagnosis: LEFT INGUINAL HERNIA   Location: [facility] OR ROOM 02 / [facility]   Surgeons: Donnie Mesa, MD      DISCUSSION: Patient is a 74 year old male scheduled for the above procedure.  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  Last cardiology evaluation by Jeri Lager, NP on 11/13/19.  12/05/2018 LHC done for anginal symptoms.  Symptoms felt related to small vessel disease and microvascular disease.  Symptoms improved with Ranexa.  Still some occasional left arm tightness but overall felt stable. In regards to preoperative cardiology input, Dr. Einar Gip wrote, "Jesus Young is at low risk, from a cardiac standpoint, for his upcoming procedure, inguinal hernia repair. Please hold Plavix for 5 days prior to procedure.  It is ok to proceed without further cardiac testing." (See Letters Tab)  Holding Plavix for 5 days.  Per cardiology, continue ASA.   Creatinine 1.76, but stable when compared with results over the past year. Presurgical COVID-19 test scheduled for 05/02/2020.  Anesthesia team to evaluate on the day of surgery.   VS: BP 126/72   Pulse 72   Temp 36.7 C (Oral)   Resp 20   Ht 6\' 3"  (1.905 m)   Wt 88.1 kg   SpO2 98%   BMI 24.26 kg/m    PROVIDERS: Merrilee Seashore, MD is PCP. Last visit 02/21/20 with referral to general surgery for left inguinal hernia.  Adrian Prows, MD is cardiologist Jacelyn Pi, MD is endocrinologist. Last visit 01/24/20.    LABS: Preoperative labs noted.  Creatinine 1.76. Known CKD, stage III. In review of  labs in available records, Cr range of 1.42-2.26 since 2014, most recently 1.86 on 01/17/20, 1.78 on 08/21/19, and 1.76 on 06/05/19 from labs received from [facility]. (all labs ordered are listed, but only abnormal results are displayed)  Labs Reviewed  GLUCOSE, CAPILLARY - Abnormal; Notable for the following components:      Result Value   Glucose-Capillary 130 (*)    All other components within normal limits  HEMOGLOBIN A1C - Abnormal; Notable for the following components:   Hgb A1c MFr Bld 6.9 (*)    All other components within normal limits  BASIC METABOLIC PANEL - Abnormal; Notable for the following components:   Glucose, Bld 143 (*)    Creatinine, Ser 1.76 (*)    GFR calc non Af Amer 38 (*)    GFR calc Af Amer 43 (*)    All other components within normal limits  CBC     EKG: EKG 11/13/2019: Normal sinus rhythm at 70 bpm, left atrial abnormality. LBBB, no further analysis.    CV: Cardiac cath 12/05/18: Coronary angiogram 12/05/2018: Left main diffusely diseased and occluded proximal LAD and circumflex pulmonary artery.  RCA occluded in the proximal segment.  SVG to RCA is patent, diffusely diseased PL branch with high-grade stenosis of 90 to 99% and PDA has about a 60 to 70% mid stenosis.  Small vessels. LAD is diffusely diseased.  It is occluded in the proximal segment.  Supplied by LIMA to LAD.  Distal to the insertion, there is a 60  to 70% diffuse disease.  Small vessel distally. Free radial graft to OM1 is widely patent.  Distal circumflex has a high-grade lesion, small vessel. SVG to D1 is occluded. Normal LV systolic function, mild posterior hypokinesis.  Normal LVEDP.  EF estimated at 55%. Recommendation: Patient symptoms of angina pectoris is related to small vessel disease and microvascular disease.  Medical therapy only is advised.  Do not think that angioplasty to the native LAD would improve his symptoms and also outcomes would be more complex in view  of diffuse disease.  Similarly right coronary artery does not appear to be amenable for percutaneous angioplasty in view of diffuse disease.  Echo 08/18/18 [facility] CV):  Conclusions: 1.  Left ventricle cavity is normal in size.  Abnormal septal wall motion due to left bundle branch block.  Doppler evidence of grade 1 (impaired) diastolic dysfunction, normal LAP.  Low normal LV systolic function.  Visual EF is 50 to 55%.  Calculated EF is 58%. 2.  Structurally normal tricuspid valve with trace regurgitation. 3.  Structurally normal pulmonic valve with trace regurgitation. 4.  Compared to the study done on 01/22/2013, no significant change.   Past Medical History:  Diagnosis Date  . Anxiety   . Arthritis    back and knees  . Bronchitis    hx of 2015  . Cancer [facility])    prostate  . Cataract    immature on right  . Chronic back pain   . CKD (chronic kidney disease)   . Constipation    takes Colace at bedtime  . Coronary artery disease    takes Plavix daily but is on hold for surgery  . Depression   . Diabetes mellitus without complication ([place])    takes Levemir and Humalog daily  . Dizziness   . Fall    hx of with injury to left shoulder   . GERD (gastroesophageal reflux disease)    takes Protonix daily and Zantac  . H/O urinary frequency   . Hemorrhoids   . History of bladder infections   . History of kidney stones   . Hyperlipidemia    takes Zocor every evening  . Hypertension    takes Metoprolol daily  . Hypothyroidism    takes Synthroid daily  . Left bundle branch block   . Myocardial infarction [facility])    many yrs before 2002  . Night muscle spasms    takes Robaxin 4 x day   . Nocturia     Past Surgical History:  Procedure Laterality Date  . CARDIAC CATHETERIZATION  05/17/02  . COLONOSCOPY    . CORONARY ANGIOPLASTY     x 1   . CORONARY ARTERY BYPASS GRAFT  2002   x 5  . eyelid surgery     . LEFT HEART CATH AND CORS/GRAFTS ANGIOGRAPHY N/A 12/05/2018    Procedure: LEFT HEART CATH AND CORS/GRAFTS ANGIOGRAPHY;  Surgeon: Adrian Prows, MD;  Location: [facility] CV LAB;  Service: Cardiovascular;  Laterality: N/A;  . LUMBAR LAMINECTOMY/DECOMPRESSION MICRODISCECTOMY Bilateral 07/18/2013   Procedure: Bilateral Lumbar four-five Lumbar Laminotomy/Microdiskectomy;  Surgeon: Hosie Spangle, MD;  Location: [facility] NEURO ORS;  Service: Neurosurgery;  Laterality: Bilateral;  Bilateral Lumbar four-five Lumbar Laminotomy/Microdiskectomy  . NEPHROLITHOTOMY Left 06/23/2015   Procedure: NEPHROLITHOTOMY PERCUTANEOUS;  Surgeon: [name], MD;  Location: [facility] ORS;  Service: Urology;  Laterality: Left;  . PROSTATECTOMY  10/2009  . skin spots removed from back -precancerous    . TONSILLECTOMY      MEDICATIONS: .  acetaminophen (TYLENOL) 650 MG CR tablet  . amLODipine (NORVASC) 5 MG tablet  . aspirin EC 81 MG tablet  . B-D ULTRAFINE III SHORT PEN 31G X 8 MM MISC  . busPIRone (BUSPAR) 10 MG tablet  . clopidogrel (PLAVIX) 75 MG tablet  . docusate sodium (COLACE) 100 MG capsule  . famotidine (PEPCID) 40 MG tablet  . insulin detemir (LEVEMIR) 100 UNIT/ML injection  . insulin lispro (HUMALOG) 100 UNIT/ML injection  . isosorbide mononitrate (IMDUR) 120 MG 24 hr tablet  . levothyroxine (SYNTHROID, LEVOTHROID) 100 MCG tablet  . metoprolol (LOPRESSOR) 50 MG tablet  . nitroGLYCERIN (NITROSTAT) 0.4 MG SL tablet  . ONE TOUCH ULTRA TEST test strip  . pantoprazole (PROTONIX) 40 MG tablet  . ranolazine (RANEXA) 500 MG 12 hr tablet  . rosuvastatin (CRESTOR) 20 MG tablet   No current facility-administered medications for this encounter.    Myra Gianotti, PA-C Surgical Short Stay/Anesthesiology [facility] Phone [phone] [facility] Phone [phone] 05/01/2020 3:23 PM

## 2020-05-01 NOTE — Telephone Encounter (Signed)
Told patient not to stop aspirin.

## 2020-05-02 ENCOUNTER — Other Ambulatory Visit (HOSPITAL_COMMUNITY)
Admission: RE | Admit: 2020-05-02 | Discharge: 2020-05-02 | Disposition: A | Discharge status: Home / Self Care | Payer: Medicare Other | Source: Ambulatory Visit | Attending: Surgery | Admitting: Surgery

## 2020-05-02 DIAGNOSIS — Z01812 Encounter for preprocedural laboratory examination: Secondary | ICD-10-CM | POA: Diagnosis not present

## 2020-05-02 DIAGNOSIS — Z20822 Contact with and (suspected) exposure to covid-19: Secondary | ICD-10-CM | POA: Insufficient documentation

## 2020-05-02 LAB — SARS CORONAVIRUS 2 (TAT 6-24 HRS): SARS Coronavirus 2: NEGATIVE

## 2020-05-06 ENCOUNTER — Other Ambulatory Visit: Payer: Self-pay

## 2020-05-06 ENCOUNTER — Encounter (HOSPITAL_COMMUNITY): Payer: Self-pay | Admitting: Surgery

## 2020-05-06 ENCOUNTER — Observation Stay (HOSPITAL_COMMUNITY)
Admission: RE | Admit: 2020-05-06 | Discharge: 2020-05-07 | Disposition: A | Discharge status: Home / Self Care | Payer: Medicare Other | Attending: Surgery | Admitting: Surgery

## 2020-05-06 ENCOUNTER — Ambulatory Visit (HOSPITAL_COMMUNITY): Payer: Medicare Other | Admitting: Certified Registered"

## 2020-05-06 ENCOUNTER — Ambulatory Visit (HOSPITAL_COMMUNITY): Payer: Medicare Other | Admitting: Physician Assistant

## 2020-05-06 ENCOUNTER — Encounter (HOSPITAL_COMMUNITY)
Admission: RE | Disposition: A | Discharge status: Home / Self Care | Payer: Self-pay | Source: Home / Self Care | Attending: Surgery

## 2020-05-06 DIAGNOSIS — E119 Type 2 diabetes mellitus without complications: Secondary | ICD-10-CM | POA: Diagnosis not present

## 2020-05-06 DIAGNOSIS — I25119 Atherosclerotic heart disease of native coronary artery with unspecified angina pectoris: Secondary | ICD-10-CM | POA: Diagnosis not present

## 2020-05-06 DIAGNOSIS — I25799 Atherosclerosis of other coronary artery bypass graft(s) with unspecified angina pectoris: Secondary | ICD-10-CM | POA: Diagnosis not present

## 2020-05-06 DIAGNOSIS — Z87442 Personal history of urinary calculi: Secondary | ICD-10-CM | POA: Insufficient documentation

## 2020-05-06 DIAGNOSIS — I10 Essential (primary) hypertension: Secondary | ICD-10-CM | POA: Insufficient documentation

## 2020-05-06 DIAGNOSIS — Z794 Long term (current) use of insulin: Secondary | ICD-10-CM | POA: Insufficient documentation

## 2020-05-06 DIAGNOSIS — E7800 Pure hypercholesterolemia, unspecified: Secondary | ICD-10-CM | POA: Insufficient documentation

## 2020-05-06 DIAGNOSIS — Z887 Allergy status to serum and vaccine status: Secondary | ICD-10-CM | POA: Insufficient documentation

## 2020-05-06 DIAGNOSIS — K4090 Unilateral inguinal hernia, without obstruction or gangrene, not specified as recurrent: Principal | ICD-10-CM | POA: Insufficient documentation

## 2020-05-06 DIAGNOSIS — Z888 Allergy status to other drugs, medicaments and biological substances status: Secondary | ICD-10-CM | POA: Diagnosis not present

## 2020-05-06 DIAGNOSIS — Z79899 Other long term (current) drug therapy: Secondary | ICD-10-CM | POA: Insufficient documentation

## 2020-05-06 DIAGNOSIS — I129 Hypertensive chronic kidney disease with stage 1 through stage 4 chronic kidney disease, or unspecified chronic kidney disease: Secondary | ICD-10-CM | POA: Diagnosis not present

## 2020-05-06 DIAGNOSIS — M549 Dorsalgia, unspecified: Secondary | ICD-10-CM | POA: Insufficient documentation

## 2020-05-06 DIAGNOSIS — Z86010 Personal history of colonic polyps: Secondary | ICD-10-CM | POA: Insufficient documentation

## 2020-05-06 DIAGNOSIS — F419 Anxiety disorder, unspecified: Secondary | ICD-10-CM | POA: Insufficient documentation

## 2020-05-06 DIAGNOSIS — K219 Gastro-esophageal reflux disease without esophagitis: Secondary | ICD-10-CM | POA: Insufficient documentation

## 2020-05-06 DIAGNOSIS — I252 Old myocardial infarction: Secondary | ICD-10-CM | POA: Insufficient documentation

## 2020-05-06 DIAGNOSIS — Z8546 Personal history of malignant neoplasm of prostate: Secondary | ICD-10-CM | POA: Insufficient documentation

## 2020-05-06 HISTORY — PX: INSERTION OF MESH: SHX5868

## 2020-05-06 HISTORY — PX: INGUINAL HERNIA REPAIR: SHX194

## 2020-05-06 LAB — GLUCOSE, CAPILLARY
Glucose-Capillary: 132 mg/dL — ABNORMAL HIGH (ref 70–99)
Glucose-Capillary: 216 mg/dL — ABNORMAL HIGH (ref 70–99)

## 2020-05-06 SURGERY — REPAIR, HERNIA, INGUINAL, ADULT
Anesthesia: General | Site: Inguinal | Laterality: Left

## 2020-05-06 MED ORDER — BUSPIRONE HCL 10 MG PO TABS
10.0000 mg | ORAL_TABLET | Freq: Two times a day (BID) | ORAL | Status: DC
  Administered 2020-05-06: 10 mg via ORAL
  Filled 2020-05-06: qty 2
  Filled 2020-05-06 (×2): qty 1

## 2020-05-06 MED ORDER — LIDOCAINE 2% (20 MG/ML) 5 ML SYRINGE
INTRAMUSCULAR | Status: AC
  Filled 2020-05-06: qty 5

## 2020-05-06 MED ORDER — LORATADINE 10 MG PO TABS: 10.0000 mg | ORAL_TABLET | Freq: Every day | ORAL | Status: DC

## 2020-05-06 MED ORDER — INSULIN ASPART 100 UNIT/ML ~~LOC~~ SOLN
0.0000 [IU] | Freq: Three times a day (TID) | SUBCUTANEOUS | Status: DC
  Administered 2020-05-07: 8 [IU] via SUBCUTANEOUS

## 2020-05-06 MED ORDER — TRAMADOL HCL 50 MG PO TABS
50.0000 mg | ORAL_TABLET | Freq: Four times a day (QID) | ORAL | Status: DC | PRN
  Administered 2020-05-07: 50 mg via ORAL
  Filled 2020-05-06: qty 1

## 2020-05-06 MED ORDER — ROCURONIUM BROMIDE 10 MG/ML (PF) SYRINGE
PREFILLED_SYRINGE | INTRAVENOUS | Status: AC
  Filled 2020-05-06: qty 10

## 2020-05-06 MED ORDER — PROPOFOL 10 MG/ML IV BOLUS
INTRAVENOUS | Status: AC
  Filled 2020-05-06: qty 20

## 2020-05-06 MED ORDER — DIPHENHYDRAMINE HCL 12.5 MG/5ML PO ELIX: 12.5000 mg | ORAL_SOLUTION | Freq: Four times a day (QID) | ORAL | Status: DC | PRN

## 2020-05-06 MED ORDER — ONDANSETRON 4 MG PO TBDP: 4.0000 mg | ORAL_TABLET | Freq: Four times a day (QID) | ORAL | Status: DC | PRN

## 2020-05-06 MED ORDER — ENOXAPARIN SODIUM 40 MG/0.4ML ~~LOC~~ SOLN: 40.0000 mg | SUBCUTANEOUS | Status: DC

## 2020-05-06 MED ORDER — ORAL CARE MOUTH RINSE: 15.0000 mL | Freq: Once | OROMUCOSAL | Status: AC

## 2020-05-06 MED ORDER — OXYCODONE HCL 5 MG PO TABS: 5.0000 mg | ORAL_TABLET | ORAL | Status: DC | PRN

## 2020-05-06 MED ORDER — FENTANYL CITRATE (PF) 250 MCG/5ML IJ SOLN
INTRAMUSCULAR | Status: AC
  Filled 2020-05-06: qty 5

## 2020-05-06 MED ORDER — ROSUVASTATIN CALCIUM 20 MG PO TABS: 20.0000 mg | ORAL_TABLET | Freq: Every day | ORAL | Status: DC

## 2020-05-06 MED ORDER — OXYCODONE HCL 5 MG PO TABS: 5.0000 mg | ORAL_TABLET | Freq: Four times a day (QID) | ORAL | 0 refills | Status: DC | PRN

## 2020-05-06 MED ORDER — PHENYLEPHRINE 40 MCG/ML (10ML) SYRINGE FOR IV PUSH (FOR BLOOD PRESSURE SUPPORT)
PREFILLED_SYRINGE | INTRAVENOUS | Status: AC
  Filled 2020-05-06: qty 10

## 2020-05-06 MED ORDER — DEXAMETHASONE SODIUM PHOSPHATE 4 MG/ML IJ SOLN
INTRAMUSCULAR | Status: DC | PRN
  Administered 2020-05-06: 10 mg via INTRAVENOUS

## 2020-05-06 MED ORDER — DOCUSATE SODIUM 100 MG PO CAPS
400.0000 mg | ORAL_CAPSULE | Freq: Every day | ORAL | Status: DC
  Administered 2020-05-06: 400 mg via ORAL
  Filled 2020-05-06: qty 4

## 2020-05-06 MED ORDER — BUPIVACAINE HCL (PF) 0.25 % IJ SOLN
INTRAMUSCULAR | Status: AC
  Filled 2020-05-06: qty 30

## 2020-05-06 MED ORDER — RANOLAZINE ER 500 MG PO TB12
1000.0000 mg | ORAL_TABLET | Freq: Two times a day (BID) | ORAL | Status: DC
  Administered 2020-05-06: 1000 mg via ORAL
  Filled 2020-05-06 (×2): qty 2

## 2020-05-06 MED ORDER — CHLORHEXIDINE GLUCONATE CLOTH 2 % EX PADS: 6.0000 | MEDICATED_PAD | Freq: Once | CUTANEOUS | Status: DC

## 2020-05-06 MED ORDER — FAMOTIDINE 20 MG PO TABS
40.0000 mg | ORAL_TABLET | Freq: Every day | ORAL | Status: DC
  Administered 2020-05-06: 40 mg via ORAL
  Filled 2020-05-06: qty 2

## 2020-05-06 MED ORDER — BUPIVACAINE HCL (PF) 0.25 % IJ SOLN
INTRAMUSCULAR | Status: DC | PRN
Start: 2020-05-06 — End: 2020-05-06
  Administered 2020-05-06: 20 mL

## 2020-05-06 MED ORDER — PANTOPRAZOLE SODIUM 40 MG PO TBEC
40.0000 mg | DELAYED_RELEASE_TABLET | Freq: Every day | ORAL | Status: DC
  Administered 2020-05-06: 40 mg via ORAL
  Filled 2020-05-06: qty 1

## 2020-05-06 MED ORDER — PROPOFOL 10 MG/ML IV BOLUS
INTRAVENOUS | Status: DC | PRN
  Administered 2020-05-06: 200 mg via INTRAVENOUS

## 2020-05-06 MED ORDER — CEFAZOLIN SODIUM-DEXTROSE 2-4 GM/100ML-% IV SOLN
INTRAVENOUS | Status: AC
  Filled 2020-05-06: qty 100

## 2020-05-06 MED ORDER — MORPHINE SULFATE (PF) 2 MG/ML IV SOLN: 2.0000 mg | INTRAVENOUS | Status: DC | PRN

## 2020-05-06 MED ORDER — LIDOCAINE 2% (20 MG/ML) 5 ML SYRINGE
INTRAMUSCULAR | Status: DC | PRN
  Administered 2020-05-06: 100 mg via INTRAVENOUS

## 2020-05-06 MED ORDER — LACTATED RINGERS IV SOLN: INTRAVENOUS | Status: DC

## 2020-05-06 MED ORDER — CHLORHEXIDINE GLUCONATE 0.12 % MT SOLN
OROMUCOSAL | Status: AC
  Administered 2020-05-06: 15 mL via OROMUCOSAL
  Filled 2020-05-06: qty 15

## 2020-05-06 MED ORDER — CHLORHEXIDINE GLUCONATE 0.12 % MT SOLN: 15.0000 mL | Freq: Once | OROMUCOSAL | Status: AC

## 2020-05-06 MED ORDER — ONDANSETRON HCL 4 MG/2ML IJ SOLN
INTRAMUSCULAR | Status: DC | PRN
  Administered 2020-05-06: 4 mg via INTRAVENOUS

## 2020-05-06 MED ORDER — DIPHENHYDRAMINE HCL 50 MG/ML IJ SOLN: 12.5000 mg | Freq: Four times a day (QID) | INTRAMUSCULAR | Status: DC | PRN

## 2020-05-06 MED ORDER — AMLODIPINE BESYLATE 5 MG PO TABS: 5.0000 mg | ORAL_TABLET | Freq: Every day | ORAL | Status: DC

## 2020-05-06 MED ORDER — MIDAZOLAM HCL 2 MG/2ML IJ SOLN
INTRAMUSCULAR | Status: AC
  Filled 2020-05-06: qty 2

## 2020-05-06 MED ORDER — GABAPENTIN 300 MG PO CAPS
300.0000 mg | ORAL_CAPSULE | Freq: Two times a day (BID) | ORAL | Status: DC
  Administered 2020-05-06: 300 mg via ORAL
  Filled 2020-05-06: qty 1

## 2020-05-06 MED ORDER — ONDANSETRON HCL 4 MG/2ML IJ SOLN: 4.0000 mg | Freq: Four times a day (QID) | INTRAMUSCULAR | Status: DC | PRN

## 2020-05-06 MED ORDER — ACETAMINOPHEN 500 MG PO TABS
1000.0000 mg | ORAL_TABLET | Freq: Four times a day (QID) | ORAL | Status: DC
  Administered 2020-05-06 – 2020-05-07 (×3): 1000 mg via ORAL
  Filled 2020-05-06 (×3): qty 2

## 2020-05-06 MED ORDER — GABAPENTIN 300 MG PO CAPS
ORAL_CAPSULE | ORAL | Status: AC
  Administered 2020-05-06: 300 mg via ORAL
  Filled 2020-05-06: qty 1

## 2020-05-06 MED ORDER — FENTANYL CITRATE (PF) 100 MCG/2ML IJ SOLN
INTRAMUSCULAR | Status: DC | PRN
  Administered 2020-05-06 (×2): 50 ug via INTRAVENOUS

## 2020-05-06 MED ORDER — METOPROLOL TARTRATE 50 MG PO TABS
50.0000 mg | ORAL_TABLET | Freq: Two times a day (BID) | ORAL | Status: DC
  Administered 2020-05-06: 50 mg via ORAL
  Filled 2020-05-06: qty 1

## 2020-05-06 MED ORDER — ACETAMINOPHEN 500 MG PO TABS
1000.0000 mg | ORAL_TABLET | ORAL | Status: AC
  Administered 2020-05-06: 1000 mg via ORAL
  Filled 2020-05-06: qty 2

## 2020-05-06 MED ORDER — ISOSORBIDE MONONITRATE ER 60 MG PO TB24: 120.0000 mg | ORAL_TABLET | Freq: Every day | ORAL | Status: DC

## 2020-05-06 MED ORDER — FENTANYL CITRATE (PF) 100 MCG/2ML IJ SOLN: 25.0000 ug | INTRAMUSCULAR | Status: DC | PRN

## 2020-05-06 MED ORDER — CEFAZOLIN SODIUM-DEXTROSE 2-4 GM/100ML-% IV SOLN
2.0000 g | INTRAVENOUS | Status: AC
  Administered 2020-05-06: 2 g via INTRAVENOUS

## 2020-05-06 MED ORDER — ONDANSETRON HCL 4 MG/2ML IJ SOLN
INTRAMUSCULAR | Status: AC
  Filled 2020-05-06: qty 2

## 2020-05-06 MED ORDER — 0.9 % SODIUM CHLORIDE (POUR BTL) OPTIME
TOPICAL | Status: DC | PRN
  Administered 2020-05-06: 1000 mL

## 2020-05-06 MED ORDER — LEVOTHYROXINE SODIUM 100 MCG PO TABS
100.0000 ug | ORAL_TABLET | ORAL | Status: DC
  Administered 2020-05-07: 100 ug via ORAL
  Filled 2020-05-06: qty 1

## 2020-05-06 MED ORDER — POTASSIUM CHLORIDE IN NACL 20-0.9 MEQ/L-% IV SOLN
INTRAVENOUS | Status: DC
  Filled 2020-05-06: qty 1000

## 2020-05-06 MED ORDER — PHENYLEPHRINE HCL-NACL 10-0.9 MG/250ML-% IV SOLN
INTRAVENOUS | Status: DC | PRN
Start: 2020-05-06 — End: 2020-05-06
  Administered 2020-05-06: 20 ug/min via INTRAVENOUS

## 2020-05-06 MED ORDER — GABAPENTIN 300 MG PO CAPS: 300.0000 mg | ORAL_CAPSULE | ORAL | Status: AC

## 2020-05-06 MED ORDER — DEXAMETHASONE SODIUM PHOSPHATE 10 MG/ML IJ SOLN
INTRAMUSCULAR | Status: AC
  Filled 2020-05-06: qty 1

## 2020-05-06 MED ORDER — FENTANYL CITRATE (PF) 100 MCG/2ML IJ SOLN
INTRAMUSCULAR | Status: AC
  Filled 2020-05-06: qty 2

## 2020-05-06 SURGICAL SUPPLY — 45 items
BENZOIN TINCTURE PRP APPL 2/3 (GAUZE/BANDAGES/DRESSINGS) ×2 IMPLANT
BLADE CLIPPER SURG (BLADE) IMPLANT
CHLORAPREP W/TINT 26 (MISCELLANEOUS) ×2 IMPLANT
CLOSURE STERI-STRIP 1/4X4 (GAUZE/BANDAGES/DRESSINGS) ×2 IMPLANT
COVER SURGICAL LIGHT HANDLE (MISCELLANEOUS) ×2 IMPLANT
COVER WAND RF STERILE (DRAPES) ×2 IMPLANT
DRAIN PENROSE 1/2X12 LTX STRL (WOUND CARE) IMPLANT
DRAPE LAPAROSCOPIC ABDOMINAL (DRAPES) IMPLANT
DRAPE LAPAROTOMY TRNSV 102X78 (DRAPES) IMPLANT
DRSG TEGADERM 4X4.75 (GAUZE/BANDAGES/DRESSINGS) ×2 IMPLANT
ELECT CAUTERY BLADE 6.4 (BLADE) IMPLANT
ELECT REM PT RETURN 9FT ADLT (ELECTROSURGICAL) ×2
ELECTRODE REM PT RTRN 9FT ADLT (ELECTROSURGICAL) ×1 IMPLANT
GAUZE 4X4 16PLY RFD (DISPOSABLE) ×2 IMPLANT
GAUZE SPONGE 4X4 12PLY STRL (GAUZE/BANDAGES/DRESSINGS) ×2 IMPLANT
GLOVE BIO SURGEON STRL SZ7 (GLOVE) ×2 IMPLANT
GLOVE BIOGEL PI IND STRL 7.5 (GLOVE) ×1 IMPLANT
GLOVE BIOGEL PI INDICATOR 7.5 (GLOVE) ×1
GOWN STRL REUS W/ TWL LRG LVL3 (GOWN DISPOSABLE) ×2 IMPLANT
GOWN STRL REUS W/TWL LRG LVL3 (GOWN DISPOSABLE) ×4
KIT BASIN OR (CUSTOM PROCEDURE TRAY) ×2 IMPLANT
KIT TURNOVER KIT B (KITS) ×2 IMPLANT
MESH PARIETEX PROGRIP LEFT (Mesh General) ×2 IMPLANT
NEEDLE HYPO 25GX1X1/2 BEV (NEEDLE) ×2 IMPLANT
NS IRRIG 1000ML POUR BTL (IV SOLUTION) ×2 IMPLANT
PACK GENERAL/GYN (CUSTOM PROCEDURE TRAY) ×2 IMPLANT
PAD ARMBOARD 7.5X6 YLW CONV (MISCELLANEOUS) ×2 IMPLANT
PENCIL SMOKE EVACUATOR (MISCELLANEOUS) ×2 IMPLANT
SPONGE INTESTINAL PEANUT (DISPOSABLE) ×2 IMPLANT
STRIP CLOSURE SKIN 1/2X4 (GAUZE/BANDAGES/DRESSINGS) ×2 IMPLANT
SUT MNCRL AB 4-0 PS2 18 (SUTURE) ×2 IMPLANT
SUT PDS AB 0 CT 36 (SUTURE) IMPLANT
SUT SILK 2 0 SH (SUTURE) IMPLANT
SUT SILK 3 0 (SUTURE)
SUT SILK 3-0 18XBRD TIE 12 (SUTURE) IMPLANT
SUT VIC AB 0 CT1 27 (SUTURE) ×2
SUT VIC AB 0 CT1 27XBRD ANBCTR (SUTURE) ×1 IMPLANT
SUT VIC AB 0 CT2 27 (SUTURE) ×2 IMPLANT
SUT VIC AB 2-0 SH 27 (SUTURE) ×2
SUT VIC AB 2-0 SH 27X BRD (SUTURE) ×1 IMPLANT
SUT VIC AB 3-0 SH 27 (SUTURE) ×2
SUT VIC AB 3-0 SH 27XBRD (SUTURE) ×1 IMPLANT
SYR CONTROL 10ML LL (SYRINGE) ×2 IMPLANT
TOWEL GREEN STERILE (TOWEL DISPOSABLE) ×2 IMPLANT
TOWEL GREEN STERILE FF (TOWEL DISPOSABLE) ×2 IMPLANT

## 2020-05-06 NOTE — Discharge Instructions (Signed)
[facility] _______[facility] ° °UMBILICAL OR INGUINAL HERNIA REPAIR: POST OP INSTRUCTIONS ° °Always review your discharge instruction sheet given to you by the facility where your surgery was performed. °IF YOU HAVE DISABILITY OR FAMILY LEAVE FORMS, YOU MUST BRING THEM TO THE OFFICE FOR PROCESSING.   °DO NOT GIVE THEM TO YOUR DOCTOR. ° °1. A  prescription for pain medication may be given to you upon discharge.  Take your pain medication as prescribed, if needed.  If narcotic pain medicine is not needed, then you may take acetaminophen (Tylenol) or ibuprofen (Advil) as needed. °2. Take your usually prescribed medications unless otherwise directed. °If you need a refill on your pain medication, please contact your pharmacy.  They will contact our office to request authorization. Prescriptions will not be filled after 5 pm or on week-ends. °3. You should follow a light diet the first 24 hours after arrival home, such as soup and crackers, etc.  Be sure to include lots of fluids daily.  Resume your normal diet the day after surgery. °4.Most patients will experience some swelling and bruising around the umbilicus or in the groin and scrotum.  Ice packs and reclining will help.  Swelling and bruising can take several days to resolve.  °6. It is common to experience some constipation if taking pain medication after surgery.  Increasing fluid intake and taking a stool softener (such as Colace) will usually help or prevent this problem from occurring.  A mild laxative (Milk of Magnesia or Miralax) should be taken according to package directions if there are no bowel movements after 48 hours. °7. Unless discharge instructions indicate otherwise, you may remove your bandages 24-48 hours after surgery, and you may shower at that time.  You may have steri-strips (small skin tapes) in place directly over the incision.  These strips should be left on the skin for 7-10 days.  If your surgeon used skin glue on the  incision, you may shower in 24 hours.  The glue will flake off over the next 2-3 weeks.  Any sutures or staples will be removed at the office during your follow-up visit. °8. ACTIVITIES:  You may resume regular (light) daily activities beginning the next day--such as daily self-care, walking, climbing stairs--gradually increasing activities as tolerated.  You may have sexual intercourse when it is comfortable.  Refrain from any heavy lifting or straining until approved by your doctor. ° °a.You may drive when you are no longer taking prescription pain medication, you can comfortably wear a seatbelt, and you can safely maneuver your car and apply brakes. °b.RETURN TO WORK:   °_____________________________________________ ° °9.You should see your doctor in the office for a follow-up appointment approximately 2-3 weeks after your surgery.  Make sure that you call for this appointment within a day or two after you arrive home to insure a convenient appointment time. °10.OTHER INSTRUCTIONS: _________________________ °   _____________________________________ ° °WHEN TO CALL YOUR DOCTOR: °1. Fever over 101.0 °2. Inability to urinate °3. Nausea and/or vomiting °4. Extreme swelling or bruising °5. Continued bleeding from incision. °6. Increased pain, redness, or drainage from the incision ° °The clinic staff is available to answer your questions during regular business hours.  Please don’t hesitate to call and ask to speak to one of the nurses for clinical concerns.  If you have a medical emergency, go to the nearest emergency room or call 911.  A surgeon from [facility] is always on call at the hospital ° ° °  [address], [address], [place], [place]  [address] ? ° [address] [address], , [place]   [address] °[phone] ? [phone] ? FAX [phone] °Web site: [email or web address] °

## 2020-05-06 NOTE — H&P (Signed)
History of Present Illness The patient is a 74 year old male who presents with an inguinal hernia.  PCP - Dr. Ashby Dawes Cardiology - Dr. Einar Gip  Reason for referral - left inguinal hernia  This is a 74 year old male with coronary artery disease chronically anticoagulated on Plavix and aspirin. Recently he was in the bathroom and looked at himself in the mirror and noticed a bulge in his left groin. He was not having any symptoms at that time. He is not sure whether the bulge is reducible. Since that time, he has paid more attention and does get occasional twinges of discomfort radiating down towards the base of his penis and his rectum. He denies any change in his bowel habits. Testicular swelling. No imaging has been performed.   Problem List/Past Medical  LEFT INGUINAL HERNIA (K40.90)  Past Surgical History  Colon Polyp Removal - Colonoscopy Spinal Surgery - Lower Back  Diagnostic Studies History Colonoscopy 1-5 years ago  Allergies  Lisinopril & Diet Manage Prod *ANTIHYPERTENSIVES* Tetanus Toxoid Adsorbed *TOXOIDS* clonazePAM *ANTICONVULSANTS* Allergies Reconciled  Medication History  amLODIPine Besylate (5MG  Tablet, Oral) Active. BD Pen Needle Short U/F (31G X 8 MM Misc,) Active. busPIRone HCl (10MG  Tablet, Oral) Active. Clopidogrel Bisulfate (75MG  Tablet, Oral) Active. Famotidine (40MG  Tablet, Oral) Active. Insulin Lispro (1 Unit Dial) (100UNIT/ML Soln Pen-inj, Subcutaneous) Active. Levothyroxine Sodium (100MCG Tablet, Oral) Active. Levemir FlexTouch (100UNIT/ML Soln Pen-inj, Subcutaneous) Active. Metoprolol Tartrate (50MG  Tablet, Oral) Active. Pantoprazole Sodium (40MG  Tablet DR, Oral) Active. Rosuvastatin Calcium (20MG  Tablet, Oral) Active. Probiotic (Oral) Specific strength unknown - Active. Stool Softener (Oral) Specific strength unknown - Active. Insulin Syringe (28G X 1/2"0.5 ML Misc,) Active. GlucaGen (1MG  For Solution,  Injection) Active. FreeStyle Libre 14 Day Sensor Active. Medications Reconciled  Social History  Alcohol use Remotely quit alcohol use.  Family History  Arthritis Father.  Other Problems  Anxiety Disorder Arthritis Back Pain Diabetes Mellitus Gastroesophageal Reflux Disease Hypercholesterolemia Kidney Stone Prostate Cancer     Review of Systems General Present- Fatigue and Weight Gain. Not Present- Appetite Loss, Chills, Fever, Night Sweats and Weight Loss. Skin Present- Dryness, New Lesions and Non-Healing Wounds. Not Present- Change in Wart/Mole, Hives, Jaundice, Rash and Ulcer. HEENT Present- Hearing Loss, Seasonal Allergies and Wears glasses/contact lenses. Not Present- Earache, Hoarseness, Nose Bleed, Oral Ulcers, Ringing in the Ears, Sinus Pain, Sore Throat, Visual Disturbances and Yellow Eyes. Respiratory Present- Chronic Cough and Snoring. Not Present- Bloody sputum, Difficulty Breathing and Wheezing. Breast Not Present- Breast Mass, Breast Pain, Nipple Discharge and Skin Changes. Cardiovascular Present- Chest Pain. Not Present- Difficulty Breathing Lying Down, Leg Cramps, Palpitations, Rapid Heart Rate, Shortness of Breath and Swelling of Extremities. Gastrointestinal Present- Change in Bowel Habits. Not Present- Abdominal Pain, Bloating, Bloody Stool, Chronic diarrhea, Constipation, Difficulty Swallowing, Excessive gas, Gets full quickly at meals, Hemorrhoids, Indigestion, Nausea, Rectal Pain and Vomiting. Male Genitourinary Present- Impotence. Not Present- Blood in Urine, Change in Urinary Stream, Frequency, Nocturia, Painful Urination, Urgency and Urine Leakage. Musculoskeletal Present- Back Pain, Joint Pain and Joint Stiffness. Not Present- Muscle Pain, Muscle Weakness and Swelling of Extremities. Neurological Present- Decreased Memory. Not Present- Fainting, Headaches, Numbness, Seizures, Tingling, Tremor, Trouble walking and Weakness. Psychiatric  Present- Anxiety. Not Present- Bipolar, Change in Sleep Pattern, Depression, Fearful and Frequent crying. Endocrine Present- Hair Changes. Not Present- Cold Intolerance, Excessive Hunger, Heat Intolerance, Hot flashes and New Diabetes. Hematology Present- Blood Thinners, Easy Bruising and Gland problems. Not Present- Excessive bleeding, HIV and Persistent Infections.  Vitals  Weight: 198.13 lb  Height: 74in Body Surface Area: 2.16 m Body Mass Index: 25.44 kg/m  Temp.: 97.35F  Pulse: 88 (Regular)  BP: 132/80(Sitting, Left Arm, Standard)        Physical Exam  The physical exam findings are as follows: Note:Constitutional: WDWN in NAD, conversant, no obvious deformities; resting comfortably Eyes: Pupils equal, round; sclera anicteric; moist conjunctiva; no lid lag HENT: Oral mucosa moist; good dentition Neck: No masses palpated, trachea midline; no thyromegaly Lungs: CTA bilaterally; normal respiratory effort CV: Regular rate and rhythm; no murmurs; extremities well-perfused with no edema Abd: +bowel sounds, soft, non-tender, no palpable organomegaly; no palpable hernias GU: Bilateral descended testes, no testicular masses, no sign of right inguinal hernia, visible reducible left inguinal hernia Musc: Normal gait; no apparent clubbing or cyanosis in extremities Lymphatic: No palpable cervical or axillary lymphadenopathy Skin: Warm, dry; no sign of jaundice Psychiatric - alert and oriented x 4; calm mood and affect    Assessment & Plan   LEFT INGUINAL HERNIA (K40.90)  Current Plans Schedule for Surgery - Left inguinal hernia repair with mesh. The surgical procedure has been discussed with the patient. Potential risks, benefits, alternative treatments, and expected outcomes have been explained. All of the patient's questions at this time have been answered. The likelihood of reaching the patient's treatment goal is good. The patient understand the  proposed surgical procedure and wishes to proceed. Note:We will try to obtain cardiac clearance from Dr. Einar Gip. Once he is cleared for surgery, we will plan a left inguinal hernia repair with mesh. We will check the patient at least 1 night in the hospital due to his comorbidities.   Imogene Burn. Georgette Dover, MD, [facility] Surgery  General/ Trauma Surgery   05/06/2020 2:16 PM

## 2020-05-06 NOTE — Plan of Care (Signed)
  Problem: Education: Goal: Required Educational Video(s) Outcome: Progressing

## 2020-05-06 NOTE — Anesthesia Procedure Notes (Signed)
Procedure Name: LMA Insertion Date/Time: 05/06/2020 3:03 PM Performed by: Griffin Dakin, CRNA Pre-anesthesia Checklist: Patient identified, Emergency Drugs available, Suction available and Patient being monitored Patient Re-evaluated:Patient Re-evaluated prior to induction Oxygen Delivery Method: Circle system utilized Preoxygenation: Pre-oxygenation with 100% oxygen Induction Type: IV induction LMA: LMA inserted LMA Size: 5.0 Number of attempts: 1 Placement Confirmation: positive ETCO2 and breath sounds checked- equal and bilateral Tube secured with: Tape Dental Injury: Teeth and Oropharynx as per pre-operative assessment

## 2020-05-06 NOTE — Anesthesia Postprocedure Evaluation (Signed)
Anesthesia Post Note  Patient: Jesus Young  Procedure(s) Performed: LEFT INGUINAL HERNIA REPAIR (Left Inguinal) INSERTION OF MESH (Left Inguinal)     Patient location during evaluation: PACU Anesthesia Type: General Level of consciousness: awake Pain management: pain level controlled Vital Signs Assessment: post-procedure vital signs reviewed and stable Respiratory status: spontaneous breathing Cardiovascular status: stable Postop Assessment: no apparent nausea or vomiting Anesthetic complications: no   No complications documented.  Last Vitals:  Vitals:   05/06/20 1650 05/06/20 1745  BP: (!) 144/84   Pulse: 69 78  Resp: 14 17  Temp:    SpO2: 96% 98%    Last Pain:  Vitals:   05/06/20 1636  PainSc: 1                  Helix Lafontaine

## 2020-05-06 NOTE — Op Note (Signed)
Hernia, Open, Procedure Note  Indications: The patient presented with a history of a left, reducible inguinal hernia.    Pre-operative Diagnosis: left reducible inguinal hernia Post-operative Diagnosis: same  Surgeon: Maia Petties   Assistants: none  Anesthesia: General LMA anesthesia/ TAP block  ASA Class: 2  Procedure Details  The patient was seen again in the Holding Room. The risks, benefits, complications, treatment options, and expected outcomes were discussed with the patient. The possibilities of reaction to medication, pulmonary aspiration, perforation of viscus, bleeding, recurrent infection, the need for additional procedures, and development of a complication requiring transfusion or further operation were discussed with the patient and/or family. The likelihood of success in repairing the hernia and returning the patient to their previous functional status is good.  There was concurrence with the proposed plan, and informed consent was obtained. The site of surgery was properly noted/marked. The patient was taken to the Operating Room, identified as GEARLD KERSTEIN, and the procedure verified as left inguinal hernia repair. A Time Out was held and the above information confirmed.  The patient was placed in the supine position and underwent induction of anesthesia. The lower abdomen and groin was prepped with Chloraprep and draped in the standard fashion, and 0.25% Marcaine with epinephrine was used to anesthetize the skin over the mid-portion of the left inguinal canal. An oblique incision was made. Dissection was carried down through the subcutaneous tissue with cautery to the external oblique fascia.  We opened the external oblique fascia along the direction of its fibers to the external ring.  The spermatic cord was circumferentially dissected bluntly and retracted with a Penrose drain.  The ilioinguinal nerve was identified and preserved.  The floor of the inguinal canal was  inspected and showed a moderate-sized direct hernia.  We skeletonized the spermatic cord and reduced a large indirect hernia.  The floor of the inguinal canal was closed with 0 Vicryl.  The internal ring was closed with 0 Vicryl.  We used a left Progrip mesh which was inserted and deployed across the floor of the inguinal canal. The mesh was tucked underneath the external oblique fascia laterally.  The flap of the mesh was closed around the spermatic cord to recreate the internal inguinal ring.  The mesh was secured to the pubic tubercle with 0 Vicryl.  Additional stay sutures were placed in the lower edge of the mesh.  The external oblique fascia was reapproximated with 2-0 Vicryl.  3-0 Vicryl was used to close the subcutaneous tissues and 4-0 Monocryl was used to close the skin in subcuticular fashion.  Benzoin and steri-strips were used to seal the incision.  A clean dressing was applied.  The patient was then extubated and brought to the recovery room in stable condition.  All sponge, instrument, and needle counts were correct prior to closure and at the conclusion of the case.   Estimated Blood Loss: Minimal                 Complications: None; patient tolerated the procedure well.         Disposition: PACU - hemodynamically stable.         Condition: stable  Imogene Burn. Georgette Dover, MD, [facility] Surgery  General/ Trauma Surgery   05/06/2020 4:05 PM

## 2020-05-06 NOTE — Transfer of Care (Signed)
Immediate Anesthesia Transfer of Care Note  Patient: Jesus Young  Procedure(s) Performed: LEFT INGUINAL HERNIA REPAIR (Left Inguinal) INSERTION OF MESH (Left Inguinal)  Patient Location: PACU  Anesthesia Type:General  Level of Consciousness: drowsy  Airway & Oxygen Therapy: Patient Spontanous Breathing and Patient connected to face mask oxygen  Post-op Assessment: Report given to RN and Post -op Vital signs reviewed and stable  Post vital signs: Reviewed and stable  Last Vitals:  Vitals Value Taken Time  BP 134/82 05/06/20 1605  Temp    Pulse 68 05/06/20 1607  Resp 18 05/06/20 1607  SpO2 100 % 05/06/20 1607  Vitals shown include unvalidated device data.  Last Pain:  Vitals:   05/06/20 1355  PainSc: 1       Patients Stated Pain Goal: 0 (91/91/66 0600)  Complications: No complications documented.

## 2020-05-07 ENCOUNTER — Encounter (HOSPITAL_COMMUNITY): Payer: Self-pay | Admitting: Surgery

## 2020-05-07 DIAGNOSIS — I252 Old myocardial infarction: Secondary | ICD-10-CM | POA: Diagnosis not present

## 2020-05-07 DIAGNOSIS — I10 Essential (primary) hypertension: Secondary | ICD-10-CM | POA: Diagnosis not present

## 2020-05-07 DIAGNOSIS — Z87442 Personal history of urinary calculi: Secondary | ICD-10-CM | POA: Diagnosis not present

## 2020-05-07 DIAGNOSIS — E7800 Pure hypercholesterolemia, unspecified: Secondary | ICD-10-CM | POA: Diagnosis not present

## 2020-05-07 DIAGNOSIS — Z888 Allergy status to other drugs, medicaments and biological substances status: Secondary | ICD-10-CM | POA: Diagnosis not present

## 2020-05-07 DIAGNOSIS — Z79899 Other long term (current) drug therapy: Secondary | ICD-10-CM | POA: Diagnosis not present

## 2020-05-07 DIAGNOSIS — Z794 Long term (current) use of insulin: Secondary | ICD-10-CM | POA: Diagnosis not present

## 2020-05-07 DIAGNOSIS — M549 Dorsalgia, unspecified: Secondary | ICD-10-CM | POA: Diagnosis not present

## 2020-05-07 DIAGNOSIS — K219 Gastro-esophageal reflux disease without esophagitis: Secondary | ICD-10-CM | POA: Diagnosis not present

## 2020-05-07 DIAGNOSIS — Z86010 Personal history of colonic polyps: Secondary | ICD-10-CM | POA: Diagnosis not present

## 2020-05-07 DIAGNOSIS — I25119 Atherosclerotic heart disease of native coronary artery with unspecified angina pectoris: Secondary | ICD-10-CM | POA: Diagnosis not present

## 2020-05-07 DIAGNOSIS — Z887 Allergy status to serum and vaccine status: Secondary | ICD-10-CM | POA: Diagnosis not present

## 2020-05-07 DIAGNOSIS — K4090 Unilateral inguinal hernia, without obstruction or gangrene, not specified as recurrent: Secondary | ICD-10-CM | POA: Diagnosis not present

## 2020-05-07 DIAGNOSIS — E119 Type 2 diabetes mellitus without complications: Secondary | ICD-10-CM | POA: Diagnosis not present

## 2020-05-07 LAB — GLUCOSE, CAPILLARY
Glucose-Capillary: 167 mg/dL — ABNORMAL HIGH (ref 70–99)
Glucose-Capillary: 276 mg/dL — ABNORMAL HIGH (ref 70–99)

## 2020-05-07 NOTE — Discharge Summary (Signed)
Physician Discharge Summary  [name] MRN: [id number] DOB/AGE: December 10, 1945 74 y.o.  Admit date: 05/06/2020 Discharge date: 05/07/2020  Admission Diagnoses:  Left inguinal hernia    Coronary artery disease Discharge Diagnoses: Same Active Problems:   Left inguinal hernia   Discharged Condition: good  Hospital Course: Left inguinal hernia repair with mesh 05/06/20.  We kept him overnight because of his medical comorbidities.  No issues overnight.  Ready for discharge today.   Treatments: surgery: LIH repair with mesh  Discharge Exam: Blood pressure 137/72, pulse 85, temperature 98.2 F (36.8 C), temperature source Oral, resp. rate 18, height 6\' 3"  (1.905 m), weight 89.8 kg, SpO2 98 %. General appearance: alert, cooperative and no distress Dressing dry; no hematoma; minimal bruising  Disposition: Discharge disposition: 01-Home or Self Care       Discharge Instructions    Call MD for:  persistant nausea and vomiting   Complete by: As directed    Call MD for:  redness, tenderness, or signs of infection (pain, swelling, redness, odor or green/yellow discharge around incision site)   Complete by: As directed    Call MD for:  severe uncontrolled pain   Complete by: As directed    Call MD for:  temperature >100.4   Complete by: As directed    Diet general   Complete by: As directed    Driving Restrictions   Complete by: As directed    Do not drive while taking pain medications   Increase activity slowly   Complete by: As directed    May shower / Bathe   Complete by: As directed      Allergies as of 05/07/2020   No Known Allergies     Medication List    TAKE these medications   acetaminophen 650 MG CR tablet Commonly known as: TYLENOL Take 1,300 mg by mouth every 8 (eight) hours as needed for pain. PRN   amLODipine 5 MG tablet Commonly known as: NORVASC TAKE 1 TABLET(5 MG) BY MOUTH DAILY What changed: See the new instructions.   aspirin EC  81 MG tablet Take 81 mg by mouth at bedtime.   B-D ULTRAFINE III SHORT PEN 31G X 8 MM Misc Generic drug: Insulin Pen Needle USE UTD   busPIRone 10 MG tablet Commonly known as: BUSPAR Take 10 mg by mouth 2 (two) times daily.   clopidogrel 75 MG tablet Commonly known as: PLAVIX Take 75 mg by mouth at bedtime.   docusate sodium 100 MG capsule Commonly known as: COLACE Take 400 mg by mouth at bedtime.   famotidine 40 MG tablet Commonly known as: PEPCID Take 40 mg by mouth at bedtime.   insulin detemir 100 UNIT/ML injection Commonly known as: LEVEMIR Inject 0.16 mLs (16 Units total) into the skin 2 (two) times daily. What changed: how much to take   insulin lispro 100 UNIT/ML injection Commonly known as: HUMALOG Inject 0.05-0.09 mLs (5-9 Units total) into the skin 2 (two) times daily. 5 units at lunch, and 9 units after dinner. What changed:   how much to take  additional instructions   isosorbide mononitrate 120 MG 24 hr tablet Commonly known as: IMDUR Take 1 tablet (120 mg total) by mouth daily.   levothyroxine 100 MCG tablet Commonly known as: SYNTHROID Take 100 mcg by mouth See admin instructions. Take 1 tablet (100 mcg) by mouth daily in the morning, except on Sundays   loratadine 10 MG tablet Commonly known as: CLARITIN Take 10 mg by  mouth daily.   metoprolol tartrate 50 MG tablet Commonly known as: LOPRESSOR Take 50 mg by mouth 2 (two) times daily.   nitroGLYCERIN 0.4 MG SL tablet Commonly known as: NITROSTAT Place 0.4 mg under the tongue every 5 (five) minutes as needed for chest pain.   ONE TOUCH ULTRA TEST test strip Generic drug: glucose blood   oxyCODONE 5 MG immediate release tablet Commonly known as: Oxy IR/ROXICODONE Take 1 tablet (5 mg total) by mouth every 6 (six) hours as needed for moderate pain, severe pain or breakthrough pain.   pantoprazole 40 MG tablet Commonly known as: PROTONIX Take 40 mg by mouth at bedtime.   ranolazine 500 MG  12 hr tablet Commonly known as: RANEXA Take 2 tablets by mouth twice daily   rosuvastatin 20 MG tablet Commonly known as: CRESTOR TAKE 1 TABLET BY MOUTH EVERY DAY. DISCONTINUE SIMVASTATIN What changed: See the new instructions.       Follow-up Information    Donnie Mesa, MD Follow up in 3 week(s).   Specialty: General Surgery Contact information: [address] [place] [address] [phone]               Signed: Maia Petties 05/07/2020, 10:10 AM

## 2020-05-07 NOTE — Progress Notes (Signed)
Jesus Young to be D/C'd per MD order. Discussed with the patient and all questions fully answered. ? VSS, Skin clean, dry and intact without evidence of skin break down, no evidence of skin tears noted. ? IV catheter discontinued intact. Site without signs and symptoms of complications. Dressing and pressure applied. ? An After Visit Summary was printed and given to the patient. Patient informed where to pickup prescriptions. ? D/c education completed with patient/family including follow up instructions, medication list, d/c activities limitations if indicated, with other d/c instructions as indicated by MD - patient able to verbalize understanding, all questions fully answered.  ? Patient instructed to return to ED, call 911, or call MD for any changes in condition.  ? Patient to be escorted via [place], and D/C home via private auto.

## 2020-05-21 ENCOUNTER — Ambulatory Visit: Payer: Self-pay | Admitting: Cardiology

## 2020-06-05 ENCOUNTER — Other Ambulatory Visit: Payer: Self-pay

## 2020-06-05 ENCOUNTER — Encounter: Payer: Self-pay | Admitting: Podiatry

## 2020-06-05 ENCOUNTER — Ambulatory Visit: Payer: Medicare Other | Admitting: Podiatry

## 2020-06-05 ENCOUNTER — Ambulatory Visit (INDEPENDENT_AMBULATORY_CARE_PROVIDER_SITE_OTHER): Payer: Medicare Other

## 2020-06-05 DIAGNOSIS — M722 Plantar fascial fibromatosis: Secondary | ICD-10-CM

## 2020-06-05 DIAGNOSIS — Q828 Other specified congenital malformations of skin: Secondary | ICD-10-CM | POA: Diagnosis not present

## 2020-06-05 DIAGNOSIS — E119 Type 2 diabetes mellitus without complications: Secondary | ICD-10-CM

## 2020-06-05 DIAGNOSIS — D689 Coagulation defect, unspecified: Secondary | ICD-10-CM | POA: Diagnosis not present

## 2020-06-05 DIAGNOSIS — M79676 Pain in unspecified toe(s): Secondary | ICD-10-CM

## 2020-06-05 DIAGNOSIS — B351 Tinea unguium: Secondary | ICD-10-CM

## 2020-06-05 NOTE — Patient Instructions (Signed)

## 2020-06-05 NOTE — Progress Notes (Signed)
He presents today complaining of painful elongated toenails and calluses.  He is also stating his right heel is somewhat tender.  Objective: Vital signs are stable alert oriented x3.  Pulses are palpable.  Toenails are long thick yellow dystrophic-like mycotic reactive hyperkeratotic lesion subfifth left.  Painful right heel.  Radiographs do not demonstrate any type of osseous abnormalities of the soft tissue increase in density right at the plantar fascial calcaneal insertion site.  Assessment: Plantar fasciitis right heel.  Pain in limb secondary to onychomycosis and porokeratosis.  Plan: Debridement of toenails 1 through 5 bilaterally.  Injected the left right heel today with 20 mg Kenalog 5 mg Marcaine.  Also debrided the reactive hyperkeratosis.  Follow-up with him in 3 months for nail debridement.

## 2020-06-13 ENCOUNTER — Telehealth: Payer: Self-pay | Admitting: Podiatry

## 2020-06-13 NOTE — Telephone Encounter (Signed)
Pt was seen in office on 06/05/20 and given an injection for Plantar fasciitis and was instructed to call the office if he had not seen any improvement within a week. Pt calling to inform the doctor he is still experiencing pain in the foot with very little improvement after receiving the injection.

## 2020-06-14 NOTE — Telephone Encounter (Signed)
Try Voltaren gel as well.

## 2020-06-14 NOTE — Telephone Encounter (Signed)
Try Voltaren gel as well

## 2020-06-16 NOTE — Telephone Encounter (Signed)
I informed pt of Dr. Stephenie Acres orders for Voltaren gel OTC.

## 2020-07-23 DIAGNOSIS — I129 Hypertensive chronic kidney disease with stage 1 through stage 4 chronic kidney disease, or unspecified chronic kidney disease: Secondary | ICD-10-CM | POA: Diagnosis not present

## 2020-07-23 DIAGNOSIS — E782 Mixed hyperlipidemia: Secondary | ICD-10-CM | POA: Diagnosis not present

## 2020-07-23 DIAGNOSIS — E039 Hypothyroidism, unspecified: Secondary | ICD-10-CM | POA: Diagnosis not present

## 2020-07-23 DIAGNOSIS — E1121 Type 2 diabetes mellitus with diabetic nephropathy: Secondary | ICD-10-CM | POA: Diagnosis not present

## 2020-07-23 DIAGNOSIS — E1165 Type 2 diabetes mellitus with hyperglycemia: Secondary | ICD-10-CM | POA: Diagnosis not present

## 2020-07-31 ENCOUNTER — Other Ambulatory Visit: Payer: Self-pay | Admitting: Cardiology

## 2020-07-31 DIAGNOSIS — I25118 Atherosclerotic heart disease of native coronary artery with other forms of angina pectoris: Secondary | ICD-10-CM

## 2020-08-12 ENCOUNTER — Ambulatory Visit: Payer: Medicare Other | Admitting: Podiatry

## 2020-08-12 ENCOUNTER — Other Ambulatory Visit: Payer: Self-pay

## 2020-08-12 ENCOUNTER — Encounter: Payer: Self-pay | Admitting: Podiatry

## 2020-08-12 DIAGNOSIS — B351 Tinea unguium: Secondary | ICD-10-CM

## 2020-08-12 DIAGNOSIS — M79676 Pain in unspecified toe(s): Secondary | ICD-10-CM | POA: Diagnosis not present

## 2020-08-12 DIAGNOSIS — D689 Coagulation defect, unspecified: Secondary | ICD-10-CM

## 2020-08-12 DIAGNOSIS — Q828 Other specified congenital malformations of skin: Secondary | ICD-10-CM | POA: Diagnosis not present

## 2020-08-12 DIAGNOSIS — E119 Type 2 diabetes mellitus without complications: Secondary | ICD-10-CM

## 2020-08-12 NOTE — Progress Notes (Signed)
Presents today chief complaint of painful elongated toenails and painful callus subfifth met left.  Objective: Vital signs are stable alert oriented x3.  Pulses are palpable.  There is no erythema edema cellulitis drainage odor reactive hyperkeratotic lesion subfifth metatarsal head of the left foot.  Toenails are long thick yellow dystrophic clinically mycotic painful on palpation as well as debridement.  Assessment: Pain in limb secondary to onychomycosis and reactive hyperkeratotic lesions.  Plan: Debridement of toenails 1 through 5 bilaterally.  Debridement of all reactive hyperkeratosis.  Follow-up with him in a few months

## 2020-08-21 DIAGNOSIS — H524 Presbyopia: Secondary | ICD-10-CM | POA: Diagnosis not present

## 2020-08-21 DIAGNOSIS — E119 Type 2 diabetes mellitus without complications: Secondary | ICD-10-CM | POA: Diagnosis not present

## 2020-08-29 ENCOUNTER — Other Ambulatory Visit: Payer: Self-pay | Admitting: Cardiology

## 2020-08-29 DIAGNOSIS — I25118 Atherosclerotic heart disease of native coronary artery with other forms of angina pectoris: Secondary | ICD-10-CM

## 2020-10-16 ENCOUNTER — Ambulatory Visit: Payer: Medicare Other | Admitting: Podiatry

## 2020-10-16 ENCOUNTER — Other Ambulatory Visit: Payer: Self-pay

## 2020-10-16 ENCOUNTER — Encounter: Payer: Self-pay | Admitting: Podiatry

## 2020-10-16 DIAGNOSIS — M79676 Pain in unspecified toe(s): Secondary | ICD-10-CM | POA: Diagnosis not present

## 2020-10-16 DIAGNOSIS — B351 Tinea unguium: Secondary | ICD-10-CM | POA: Diagnosis not present

## 2020-10-16 DIAGNOSIS — E119 Type 2 diabetes mellitus without complications: Secondary | ICD-10-CM

## 2020-10-16 DIAGNOSIS — D689 Coagulation defect, unspecified: Secondary | ICD-10-CM

## 2020-10-16 DIAGNOSIS — Q828 Other specified congenital malformations of skin: Secondary | ICD-10-CM

## 2020-10-18 NOTE — Progress Notes (Signed)
He presents today chief complaint of painful elongated toenails and calluses bilaterally.  Objective: Pulses remain palpable no open lesions or wounds multiple reactive hyper keratomas plantar aspect of the forefoot primarily first and fifth metatarsals bilaterally.  Otherwise toenails are long thick yellow dystrophic-like mycotic sharply incurvated painful on palpation with no signs of bacterial infection.  Assessment: Pain in limb secondary to onychomycosis and porokeratosis.  Benign skin lesions.  Plan: Debridement of benign skin lesions.  And debridement of toenails 1 through 5 bilateral.

## 2020-10-20 DIAGNOSIS — H8110 Benign paroxysmal vertigo, unspecified ear: Secondary | ICD-10-CM | POA: Diagnosis not present

## 2020-10-20 DIAGNOSIS — M25561 Pain in right knee: Secondary | ICD-10-CM | POA: Diagnosis not present

## 2020-10-21 DIAGNOSIS — E039 Hypothyroidism, unspecified: Secondary | ICD-10-CM | POA: Diagnosis not present

## 2020-10-21 DIAGNOSIS — Z0000 Encounter for general adult medical examination without abnormal findings: Secondary | ICD-10-CM | POA: Diagnosis not present

## 2020-10-21 DIAGNOSIS — E782 Mixed hyperlipidemia: Secondary | ICD-10-CM | POA: Diagnosis not present

## 2020-10-21 DIAGNOSIS — E1165 Type 2 diabetes mellitus with hyperglycemia: Secondary | ICD-10-CM | POA: Diagnosis not present

## 2020-10-21 DIAGNOSIS — Z79899 Other long term (current) drug therapy: Secondary | ICD-10-CM | POA: Diagnosis not present

## 2020-10-21 DIAGNOSIS — N1832 Chronic kidney disease, stage 3b: Secondary | ICD-10-CM | POA: Diagnosis not present

## 2020-10-21 DIAGNOSIS — I129 Hypertensive chronic kidney disease with stage 1 through stage 4 chronic kidney disease, or unspecified chronic kidney disease: Secondary | ICD-10-CM | POA: Diagnosis not present

## 2020-10-21 DIAGNOSIS — E1121 Type 2 diabetes mellitus with diabetic nephropathy: Secondary | ICD-10-CM | POA: Diagnosis not present

## 2020-10-29 DIAGNOSIS — I25118 Atherosclerotic heart disease of native coronary artery with other forms of angina pectoris: Secondary | ICD-10-CM | POA: Diagnosis not present

## 2020-10-29 DIAGNOSIS — N1832 Chronic kidney disease, stage 3b: Secondary | ICD-10-CM | POA: Diagnosis not present

## 2020-10-29 DIAGNOSIS — E875 Hyperkalemia: Secondary | ICD-10-CM | POA: Diagnosis not present

## 2020-10-29 DIAGNOSIS — Z0000 Encounter for general adult medical examination without abnormal findings: Secondary | ICD-10-CM | POA: Diagnosis not present

## 2020-10-29 DIAGNOSIS — E1165 Type 2 diabetes mellitus with hyperglycemia: Secondary | ICD-10-CM | POA: Diagnosis not present

## 2020-10-29 DIAGNOSIS — E782 Mixed hyperlipidemia: Secondary | ICD-10-CM | POA: Diagnosis not present

## 2020-10-29 DIAGNOSIS — R053 Chronic cough: Secondary | ICD-10-CM | POA: Diagnosis not present

## 2020-10-29 DIAGNOSIS — R059 Cough, unspecified: Secondary | ICD-10-CM | POA: Diagnosis not present

## 2020-11-11 NOTE — Progress Notes (Signed)
Primary Physician:  Merrilee Seashore, MD   [name], male    DOB: 1946/08/27, 74 y.o.   MRN: [id number]  Subjective:    Chief Complaint  Patient presents with  . Coronary Artery Disease  . Follow-up    1 year     HPI: Jesus Young  is a 74 y.o. male  with known coronary artery disease s/p CABG in 2012, left bundle branch block that was noted 2014, hypertension, diabetes, hyperlipidemia and chronic kidney disease..  Due to complaints of left arm pain with exertion, underwent lexiscan nuclear stress test on 08/07/2018 considered high risk study due to LVEF; however, on echocardiogram had normal LVEF. No significant changes were noted compared to the 2014, but in view of continued symptoms despite medical management, he underwent coronary angiogram on 12/05/2018 and patient was noted to have small vessel disease and microvascular disease.  Patient presents for annual follow up.  He reports he has noticed incremental worsening of left arm pain and occasional chest pressure with exertion.  States that over the last 1 year he has noticed that less strenuous exertion triggers his symptoms of left arm pain as well as occasional chest pressure.  He reports symptoms resolve after 5-10 minutes of rest.  He has not taken sublingual nitroglycerin.  Denies dyspnea, dizziness, syncope, near syncope, nausea, vomiting.  He continues to follow with Dr. Chalmers Cater for management of diabetes.   Past Medical History:  Diagnosis Date  . Anxiety   . Arthritis    back and knees  . Bronchitis    hx of 2015  . Cancer [facility])    prostate  . Cataract    immature on right  . Chronic back pain   . CKD (chronic kidney disease)   . Constipation    takes Colace at bedtime  . Coronary artery disease    takes Plavix daily but is on hold for surgery  . Depression   . Diabetes mellitus without complication ([place])    takes Levemir and Humalog daily  . Dizziness   . Fall    hx of  with injury to left shoulder   . GERD (gastroesophageal reflux disease)    takes Protonix daily and Zantac  . H/O urinary frequency   . Hemorrhoids   . History of bladder infections   . History of kidney stones   . Hyperlipidemia    takes Zocor every evening  . Hypertension    takes Metoprolol daily  . Hypothyroidism    takes Synthroid daily  . Left bundle branch block   . Myocardial infarction [facility])    many yrs before 2002  . Night muscle spasms    takes Robaxin 4 x day   . Nocturia     Past Surgical History:  Procedure Laterality Date  . CARDIAC CATHETERIZATION  05/17/02  . COLONOSCOPY    . CORONARY ANGIOPLASTY     x 1   . CORONARY ARTERY BYPASS GRAFT  2002   x 5  . eyelid surgery     . INGUINAL HERNIA REPAIR Left 05/06/2020   Procedure: LEFT INGUINAL HERNIA REPAIR;  Surgeon: Donnie Mesa, MD;  Location: [place];  Service: General;  Laterality: Left;  . INSERTION OF MESH Left 05/06/2020   Procedure: INSERTION OF MESH;  Surgeon: Donnie Mesa, MD;  Location: [place];  Service: General;  Laterality: Left;  . LEFT HEART CATH AND CORS/GRAFTS ANGIOGRAPHY N/A 12/05/2018   Procedure: LEFT HEART CATH AND CORS/GRAFTS  ANGIOGRAPHY;  Surgeon: Adrian Prows, MD;  Location: [place] CV LAB;  Service: Cardiovascular;  Laterality: N/A;  . LUMBAR LAMINECTOMY/DECOMPRESSION MICRODISCECTOMY Bilateral 07/18/2013   Procedure: Bilateral Lumbar four-five Lumbar Laminotomy/Microdiskectomy;  Surgeon: Hosie Spangle, MD;  Location: [place] [facility];  Service: Neurosurgery;  Laterality: Bilateral;  Bilateral Lumbar four-five Lumbar Laminotomy/Microdiskectomy  . NEPHROLITHOTOMY Left 06/23/2015   Procedure: NEPHROLITHOTOMY PERCUTANEOUS;  Surgeon: [name], MD;  Location: [facility];  Service: Urology;  Laterality: Left;  . PROSTATECTOMY  10/2009  . skin spots removed from back -precancerous    . TONSILLECTOMY     Social History   Tobacco Use  . Smoking status: Never Smoker  . Smokeless tobacco: Never Used   Substance Use Topics  . Alcohol use: No  Martial status: Married   ROS   Review of Systems  Constitutional: Negative for decreased appetite, malaise/fatigue, weight gain and weight loss.  Eyes: Negative for visual disturbance.  Cardiovascular: Positive for chest pain (continued left arm pain with exertion). Negative for claudication, dyspnea on exertion, leg swelling, near-syncope, orthopnea, palpitations, paroxysmal nocturnal dyspnea and syncope.       Occasional left arm pain with exertion  Respiratory: Negative for hemoptysis, shortness of breath and wheezing.   Endocrine: Negative for cold intolerance and heat intolerance.  Hematologic/Lymphatic: Does not bruise/bleed easily.  Skin: Negative for nail changes and rash.  Musculoskeletal: Negative for muscle weakness and myalgias.  Gastrointestinal: Negative for abdominal pain, change in bowel habit, melena, nausea and vomiting.  Neurological: Negative for difficulty with concentration, dizziness, focal weakness, headaches and weakness.  Psychiatric/Behavioral: Negative for altered mental status and suicidal ideas.  All other systems reviewed and are negative.     Objective:  Blood pressure 129/63, pulse 72, resp. rate 16, height 6\' 3"  (1.905 m), weight 198 lb 3.2 oz (89.9 kg), SpO2 97 %. Body mass index is 24.77 kg/m.     Physical Exam Vitals reviewed.  Constitutional:      Appearance: He is well-developed.  HENT:     Head: Normocephalic and atraumatic.  Cardiovascular:     Rate and Rhythm: Normal rate and regular rhythm.     Pulses: Intact distal pulses.          Carotid pulses are 2+ on the right side and 2+ on the left side.      Radial pulses are 2+ on the right side and 2+ on the left side.       Femoral pulses are 2+ on the right side and 2+ on the left side.      Popliteal pulses are 2+ on the right side and 2+ on the left side.       Dorsalis pedis pulses are 2+ on the right side and 2+ on the left side.        Posterior tibial pulses are 2+ on the right side and 2+ on the left side.     Heart sounds: Normal heart sounds, S1 normal and S2 normal. No murmur heard. No gallop.   Pulmonary:     Effort: Pulmonary effort is normal. No accessory muscle usage or respiratory distress.     Breath sounds: Normal breath sounds. No wheezing, rhonchi or rales.  Abdominal:     General: Bowel sounds are normal.     Palpations: Abdomen is soft.  Musculoskeletal:        General: Normal range of motion.     Cervical back: Normal range of motion.     Right lower leg:  No edema.     Left lower leg: No edema.  Skin:    General: Skin is warm and dry.  Neurological:     Mental Status: He is alert and oriented to person, place, and time.    Laboratory examination:    CMP Latest Ref Rng & Units 04/30/2020 10/22/2017 11/21/2016  Glucose 70 - 99 mg/dL 143(H) 119(H) 226(H)  BUN 8 - 23 mg/dL 23 21(H) 36(H)  Creatinine 0.61 - 1.24 mg/dL 1.76(H) 1.42(H) 1.57(H)  Sodium 135 - 145 mmol/L 136 140 137  Potassium 3.5 - 5.1 mmol/L 4.6 4.0 5.1  Chloride 98 - 111 mmol/L 106 106 102  CO2 22 - 32 mmol/L 22 25 24   Calcium 8.9 - 10.3 mg/dL 9.1 9.2 8.8(L)  Total Protein 6.5 - 8.1 g/dL - - 6.6  Total Bilirubin 0.3 - 1.2 mg/dL - - 0.9  Alkaline Phos 38 - 126 U/L - - 46  AST 15 - 41 U/L - - 37  ALT 17 - 63 U/L - - 47   CBC Latest Ref Rng & Units 04/30/2020 12/06/2018 12/05/2018  WBC 4.0 - 10.5 K/uL 6.0 - -  Hemoglobin 13.0 - 17.0 g/dL 16.0 14.2 14.3  Hematocrit 39.0 - 52.0 % 51.0 43.7 44.9  Platelets 150 - 400 K/uL 181 - -   Lipid Panel  No results found for: CHOL, TRIG, HDL, CHOLHDL, VLDL, LDLCALC, LDLDIRECT HEMOGLOBIN A1C Lab Results  Component Value Date   HGBA1C 6.9 (H) 04/30/2020   MPG 151 04/30/2020   TSH No results for input(s): TSH in the last 8760 hours.  External Labs:   10/21/2020: HDL 33, LDL 70, total cholesterol 132, triglycerides 167 TSH 1.62 BUN 28  04/30/2020: A1c 6.9%, serum creatinine  1.76   Allergies and Medications   No Known Allergies  Current Outpatient Medications  Medication Instructions  . acetaminophen (TYLENOL) 1,300 mg, Oral, Every 8 hours PRN, PRN  . amLODipine (NORVASC) 5 MG tablet TAKE 1 TABLET(5 MG) BY MOUTH DAILY  . aspirin EC 81 mg, Oral, Daily at bedtime  . B-D ULTRAFINE III SHORT PEN 31G X 8 MM MISC USE UTD  . busPIRone (BUSPAR) 15 mg, Oral, 2 times daily  . clopidogrel (PLAVIX) 75 mg, Oral, Daily at bedtime  . docusate sodium (COLACE) 400 mg, Oral, Daily at bedtime  . famotidine (PEPCID) 20 mg, Oral, 2 times daily  . insulin detemir (LEVEMIR) 16 Units, Subcutaneous, 2 times daily  . insulin lispro (HUMALOG) 5-9 Units, Subcutaneous, 2 times daily, 5 units at lunch, and 9 units after dinner.  . isosorbide mononitrate (IMDUR) 120 mg, Oral, Daily  . levothyroxine (SYNTHROID) 100 mcg, Oral, See admin instructions, Take 1 tablet (100 mcg) by mouth daily in the morning, except on Sundays  . meclizine (ANTIVERT) 25 mg, Oral, Daily PRN  . metoprolol tartrate (LOPRESSOR) 50 mg, Oral, 2 times daily  . nitroGLYCERIN (NITROSTAT) 0.4 mg, Sublingual, Every 5 min PRN  . NOVOLOG 100 UNIT/ML injection Subcutaneous  . ONE TOUCH ULTRA TEST test strip No dose, route, or frequency recorded.  Marland Kitchen oxyCODONE (OXY IR/ROXICODONE) 5 mg, Oral, Every 6 hours PRN  . pantoprazole (PROTONIX) 40 mg, Oral, Daily at bedtime  . ranolazine (RANEXA) 500 MG 12 hr tablet Take 2 tablets by mouth twice daily  . rosuvastatin (CRESTOR) 20 MG tablet TAKE 1 TABLET BY MOUTH EVERY DAY. DISCONTINUE SIMVASTATIN    Radiology:  No results found.  Cardiac Studies:   Echocardiogram  08/19/2018: Left ventricle cavity is normal in size.  Abnormal septal wall motion due to left bundle branch block. Doppler evidence of grade I (impaired) diastolic dysfunction, normal LAP. Low normal LV systolic function. Visual EF is 50-55%. Calculated EF 58%. Structurally normal tricuspid valve with trace  regurgitation. Structurally normal pulmonic valve with trace regurgitation. Compared to the study done on 01/22/2013, no significant change  Lexiscan myoview stress test 08/07/2018: 1. Lexiscan stress test was performed. Exercise capacity was not assessed. No stress symptoms reported. Peak effect blood pressure was 164/88 mmHg. The resting and stress electrocardiogram demonstrated normal sinus rhythm and LBBB. Stress EKG is non diagnostic for ischemia as it is a pharmacologic stress. 2. The overall quality of the study is good. Left ventricular cavity is noted to be normal on the rest and stress studies. Gated SPECT images reveal normal myocardial thickening and wall motion. The left ventricular ejection fraction was calculated at 32%, although visually appears near normal. Recommend correlation with echocardiogram. Mid inferior perfusion defect seen worse on rest images without corresponding wall motion abnormality likely represents tissue attenuation artifact. 3. High risk study due to low LVEF. Clinical and echocardiographic correlation recomme  Coronary angiogram 12/05/2018: Left main diffusely diseased and occluded proximal LAD and circumflex pulmonary artery. RCA occluded in the proximal segment. SVG to RCA is patent, diffusely diseased PL branch with high-grade stenosis of 90 to 99% and PDA has about a 60 to 70% mid stenosis. Small vessels. LAD is diffusely diseased. It is occluded in the proximal segment. Supplied by LIMA to LAD. Distal to the insertion, there is a 60 to 70% diffuse disease. Small vessel distally. Free radial graft to OM1 is widely patent. Distal circumflex has a high-grade lesion, small vessel. SVG to D1 is occluded. Normal LV systolic function, mild posterior hypokinesis. Normal LVEDP. EF estimated at 55%.   EKG:   EKG 11/12/2020: Sinus rhythm at a rate of 70 bpm, left atrial enlargement.  Left bundle branch block, no further analysis.  Compared to EKG  11/13/2019, no significant change.  Assessment:     ICD-10-CM   1. Coronary artery disease involving native coronary artery of native heart with other form of angina pectoris ([place])  I25.118 EKG 12-Lead    PCV ECHOCARDIOGRAM COMPLETE    PCV MYOCARDIAL PERFUSION WITH LEXISCAN  2. Essential hypertension  I10   3. Mixed hyperlipidemia  E78.2   4. CKD stage 3 due to type 2 diabetes mellitus (HCC)  E11.22    N18.30   5. Controlled type 2 diabetes mellitus with insulin therapy ([place])  E11.9    Z79.4     Meds ordered this encounter  Medications  . nitroGLYCERIN (NITROSTAT) 0.4 MG SL tablet    Sig: Place 1 tablet (0.4 mg total) under the tongue every 5 (five) minutes as needed for chest pain.    Dispense:  15 tablet    Refill:  3   Medications Discontinued During This Encounter  Medication Reason  . gabapentin (NEURONTIN) 300 MG capsule Patient Preference  . loratadine (CLARITIN) 10 MG tablet Change in therapy  . nitroGLYCERIN (NITROSTAT) 0.4 MG SL tablet Reorder     Recommendations:   Jesus Young  is a 74 y.o. male  with known coronary artery disease s/p CABG in 2012, left bundle branch block that was noted 2014, hypertension, diabetes, hyperlipidemia and CKD stage 3.  Patient presents for annual follow-up of coronary artery disease, hypertension, hyperlipidemia.  I personally reviewed external labs, lipids are well controlled.  We will continue rosuvastatin 20 mg daily.  Patient blood pressure also remains well controlled, will continue current antihypertensive medications.  In regard to coronary disease, patient has had worsening symptoms of both left arm pain as well as new chest pain which he describes as pressure with exertion.  Patient symptoms may be related to underlying progression of coronary artery disease, although symptoms resolved quickly with rest.  EKG today is unchanged compared to previous and there are no clinical signs of heart failure. Patient's last stress  test and echocardiogram were done in 2019.  In view of worsening symptoms, will obtain nuclear stress test and echocardiogram at this time to further investigate.  Discussed with patient the option of making adjustments to antianginal medications, shared decision was to wait and reevaluate medication adjustments following echocardiogram and stress test.  Will not make changes to patient's cardiovascular medications at this time.  Patient will continue amlodipine, aspirin, Plavix, Imdur, metoprolol, Ranexa, and Crestor. S/L NTG was prescribed and explained how to and when to use it and to notify us if there is change in frequency of use. Interaction with cialis-like agents was discussed.  Follow up in 1 month for CAD, angina, and results of cardiac testing.    Alethia Berthold, PA-C 11/12/2020, 12:11 PM Office: [phone]

## 2020-11-12 ENCOUNTER — Ambulatory Visit: Payer: Medicare Other | Admitting: Student

## 2020-11-12 ENCOUNTER — Other Ambulatory Visit: Payer: Self-pay

## 2020-11-12 ENCOUNTER — Encounter: Payer: Self-pay | Admitting: Student

## 2020-11-12 ENCOUNTER — Ambulatory Visit: Payer: Medicare Other | Admitting: Cardiology

## 2020-11-12 VITALS — BP 129/63 | HR 72 | Resp 16 | Ht 75.0 in | Wt 198.2 lb

## 2020-11-12 DIAGNOSIS — I10 Essential (primary) hypertension: Secondary | ICD-10-CM | POA: Diagnosis not present

## 2020-11-12 DIAGNOSIS — I25118 Atherosclerotic heart disease of native coronary artery with other forms of angina pectoris: Secondary | ICD-10-CM

## 2020-11-12 DIAGNOSIS — E782 Mixed hyperlipidemia: Secondary | ICD-10-CM | POA: Diagnosis not present

## 2020-11-12 DIAGNOSIS — E119 Type 2 diabetes mellitus without complications: Secondary | ICD-10-CM

## 2020-11-12 DIAGNOSIS — E1122 Type 2 diabetes mellitus with diabetic chronic kidney disease: Secondary | ICD-10-CM

## 2020-11-12 MED ORDER — NITROGLYCERIN 0.4 MG SL SUBL: 0.4000 mg | SUBLINGUAL_TABLET | SUBLINGUAL | 3 refills | Status: DC | PRN

## 2020-11-18 ENCOUNTER — Other Ambulatory Visit: Payer: Self-pay

## 2020-11-18 ENCOUNTER — Ambulatory Visit: Payer: Medicare Other

## 2020-11-18 DIAGNOSIS — I25118 Atherosclerotic heart disease of native coronary artery with other forms of angina pectoris: Secondary | ICD-10-CM | POA: Diagnosis not present

## 2020-11-21 NOTE — Progress Notes (Signed)
Please inform patient his echo showed mildly reduced heart function compared to previous. Will discuss further at upcoming office visit.

## 2020-11-24 ENCOUNTER — Other Ambulatory Visit: Payer: Medicare Other

## 2020-11-24 ENCOUNTER — Ambulatory Visit: Payer: Medicare Other

## 2020-11-24 ENCOUNTER — Other Ambulatory Visit: Payer: Self-pay

## 2020-11-24 DIAGNOSIS — I25118 Atherosclerotic heart disease of native coronary artery with other forms of angina pectoris: Secondary | ICD-10-CM

## 2020-11-26 NOTE — Progress Notes (Signed)
No answer - will try again later

## 2020-11-26 NOTE — Progress Notes (Signed)
Please inform patient his stress test is similar to the one done in 2019 showing reduced pumping activity of the hear during stress. We will discuss further at upcoming office visit.

## 2020-11-27 NOTE — Progress Notes (Signed)
Attempted to call pt, no answer. Unable to leave vm. AD

## 2020-11-28 NOTE — Progress Notes (Signed)
Called and spoke with pt regarding stress test results. Pt voiced understanding.

## 2020-12-09 ENCOUNTER — Telehealth: Payer: Self-pay | Admitting: *Deleted

## 2020-12-09 ENCOUNTER — Other Ambulatory Visit: Payer: Self-pay

## 2020-12-09 ENCOUNTER — Encounter: Payer: Self-pay | Admitting: Pulmonary Disease

## 2020-12-09 ENCOUNTER — Ambulatory Visit: Payer: Medicare Other | Admitting: Pulmonary Disease

## 2020-12-09 VITALS — BP 120/70 | HR 72 | Temp 98.0°F | Ht 74.0 in | Wt 196.5 lb

## 2020-12-09 DIAGNOSIS — R053 Chronic cough: Secondary | ICD-10-CM | POA: Diagnosis not present

## 2020-12-09 DIAGNOSIS — L03116 Cellulitis of left lower limb: Secondary | ICD-10-CM | POA: Diagnosis not present

## 2020-12-09 NOTE — Telephone Encounter (Signed)
Patient had 2 questions for JE after she had gone into a room with another pt.  1.  How much benadryl can he take in one day?  He would like to add a 3rd tablet each day but wanted to see what JE said about that?  2.  He has had 2 episodes of where he gets choked while he is eating and his wife has to beat on his back some to help him out.  He stated that he did not know if this is some of his issues as well.  He has slowed his eating and chews his food well, or so he thought.  Any suggestions for this?    JE please advise.  thanks

## 2020-12-09 NOTE — Progress Notes (Unsigned)
Subjective:   [name] GENDER: male DOB: 10/30/1946, MRN: [id number]   HPI  Chief Complaint  Patient presents with  . Consult    Persistant cough x 3 years.  This comes with a runny nose.  Dry and productive cough. He has seen ENT and thought maybe this was an allergy---cat is not there anymore and he still has these issues.     Reason for Visit: New consult for cough  [name] is a 75 year old male never smoker with CAD s/p CABG, DM2, HTN, allergic rhinitis who presents for chronic cough.  He has been seen by his PCP regarding chronic cough for >5 years. PCP note by Dr. Ashby Dawes on 10/29/20 was reviewed. He has been tried on antihistamines and PPI without improvement. Pulmonary referral placed.  He reports longstanding cough has been more noticeable in the last 3 years. Has tried loratadine. On benadryl in the last 2 months which seems to help decrease in frequency. He still has nasal congestion and runny nose. Not currently on nasal spray. Currently on famotidine and protonix daily though he does have occasional bloating and gas feeling.  In the morning he reports vague chest fullness/pressure/tightness. In the last three months, when he coughs in the morning, he will bring up possibly 1/3 of dixie cup of sputum. The frequency seems to range from multiple times in a week to 1-2 times a month. Denies hemoptysis. Rare wheezes.  He is less active since the pandemic.  Addendum: On phone call after our visit, patient reports choking on food when eating and sometimes needing wife to beat on his back. He has slowed down his eating but queries whether this may be contributing.  Social History: Never smoker  Second hand smoke Insurance underwriter  I have personally reviewed patient's past medical/family/social history, allergies, current medications.  Past Medical History:  Diagnosis Date  . Anxiety   . Arthritis    back and knees   . Bronchitis    hx of 2015  . Cancer [facility])    prostate  . Cataract    immature on right  . Chronic back pain   . CKD (chronic kidney disease)   . Constipation    takes Colace at bedtime  . Coronary artery disease    takes Plavix daily but is on hold for surgery  . Depression   . Diabetes mellitus without complication ([place])    takes Levemir and Humalog daily  . Dizziness   . Fall    hx of with injury to left shoulder   . GERD (gastroesophageal reflux disease)    takes Protonix daily and Zantac  . H/O urinary frequency   . Hemorrhoids   . History of bladder infections   . History of kidney stones   . Hyperlipidemia    takes Zocor every evening  . Hypertension    takes Metoprolol daily  . Hypothyroidism    takes Synthroid daily  . Left bundle branch block   . Myocardial infarction [facility])    many yrs before 2002  . Night muscle spasms    takes Robaxin 4 x day   . Nocturia      Family History  Problem Relation Age of Onset  . Heart disease Father   . Heart attack Father   . Thyroid disease Sister      Social History   Occupational History  . Not on file  Tobacco Use  . Smoking status: Never  Smoker  . Smokeless tobacco: Never Used  Vaping Use  . Vaping Use: Never used  Substance and Sexual Activity  . Alcohol use: No  . Drug use: No  . Sexual activity: Not Currently    Allergies  Allergen Reactions  . Clonazepam     Headache   . Lisinopril     headache     Outpatient Medications Prior to Visit  Medication Sig Dispense Refill  . acetaminophen (TYLENOL) 650 MG CR tablet Take 1,300 mg by mouth every 8 (eight) hours as needed for pain. PRN    . amLODipine (NORVASC) 5 MG tablet TAKE 1 TABLET(5 MG) BY MOUTH DAILY (Patient taking differently: Take 5 mg by mouth daily.) 90 tablet 2  . aspirin EC 81 MG tablet Take 81 mg by mouth at bedtime.     . B-D ULTRAFINE III SHORT PEN 31G X 8 MM MISC USE UTD  0  . busPIRone (BUSPAR) 15 MG tablet Take 15 mg by mouth 2  (two) times daily.    . clopidogrel (PLAVIX) 75 MG tablet Take 75 mg by mouth at bedtime.    . docusate sodium (COLACE) 100 MG capsule Take 400 mg by mouth at bedtime.    . famotidine (PEPCID) 20 MG tablet Take 20 mg by mouth 2 (two) times daily.    . insulin detemir (LEVEMIR) 100 UNIT/ML injection Inject 0.16 mLs (16 Units total) into the skin 2 (two) times daily. (Patient taking differently: Inject 18 Units into the skin 2 (two) times daily.) 10 mL 1  . insulin lispro (HUMALOG) 100 UNIT/ML injection Inject 0.05-0.09 mLs (5-9 Units total) into the skin 2 (two) times daily. 5 units at lunch, and 9 units after dinner. (Patient taking differently: Inject 5-15 Units into the skin 2 (two) times daily. 5-15 units as needed  (sliding scale insulin)) 10 mL 1  . isosorbide mononitrate (IMDUR) 120 MG 24 hr tablet Take 1 tablet (120 mg total) by mouth daily. 90 tablet 3  . levothyroxine (SYNTHROID, LEVOTHROID) 100 MCG tablet Take 100 mcg by mouth See admin instructions. Take 1 tablet (100 mcg) by mouth daily in the morning, except on Sundays    . meclizine (ANTIVERT) 25 MG tablet Take 25 mg by mouth daily as needed.    . metoprolol (LOPRESSOR) 50 MG tablet Take 50 mg by mouth 2 (two) times daily.     . nitroGLYCERIN (NITROSTAT) 0.4 MG SL tablet Place 1 tablet (0.4 mg total) under the tongue every 5 (five) minutes as needed for chest pain. 15 tablet 3  . NOVOLOG 100 UNIT/ML injection Inject into the skin.    . ONE TOUCH ULTRA TEST test strip     . oxyCODONE (OXY IR/ROXICODONE) 5 MG immediate release tablet Take 1 tablet (5 mg total) by mouth every 6 (six) hours as needed for moderate pain, severe pain or breakthrough pain. 20 tablet 0  . pantoprazole (PROTONIX) 40 MG tablet Take 40 mg by mouth at bedtime.     . ranolazine (RANEXA) 500 MG 12 hr tablet Take 2 tablets by mouth twice daily 120 tablet 2  . rosuvastatin (CRESTOR) 20 MG tablet TAKE 1 TABLET BY MOUTH EVERY DAY. DISCONTINUE SIMVASTATIN (Patient taking  differently: Take 20 mg by mouth daily.) 90 tablet 3   No facility-administered medications prior to visit.    Review of Systems  Constitutional: Negative for chills, diaphoresis, fever, malaise/fatigue and weight loss.  HENT: Positive for congestion. Negative for ear pain and sore throat.  Respiratory: Positive for cough, sputum production, shortness of breath and wheezing. Negative for hemoptysis.   Cardiovascular: Negative for chest pain (chest tightness), palpitations and leg swelling.  Gastrointestinal: Negative for abdominal pain, heartburn and nausea.       Bloating   Genitourinary: Negative for frequency.  Musculoskeletal: Negative for joint pain and myalgias.  Skin: Negative for itching and rash.  Neurological: Negative for dizziness, weakness and headaches.  Endo/Heme/Allergies: Does not bruise/bleed easily.  Psychiatric/Behavioral: Negative for depression. The patient is not nervous/anxious.      Objective:   Vitals:   12/09/20 1036  BP: 120/70  Pulse: 72  Temp: 98 F (36.7 C)  TempSrc: Tympanic  SpO2: 97%  Weight: 196 lb 8 oz (89.1 kg)  Height: 6\' 2"  (1.88 m)      Physical Exam: General: Well-appearing, no acute distress HENT: [place], AT Eyes: EOMI, no scleral icterus Respiratory: Clear to auscultation bilaterally.  No crackles, wheezing or rales Cardiovascular: RRR, -M/R/G, no JVD Extremities:-Edema,-tenderness Neuro: AAO x4, CNII-XII grossly intact Skin: Intact, no rashes or bruising Psych: Normal mood, normal affect  Data Reviewed:  Imaging: CXR 10/30/15 S/p CABG. No infiltrate, effusion or edema  PFT: None on file  Labs: CBC    Component Value Date/Time   WBC 6.0 04/30/2020 1100   RBC 5.10 04/30/2020 1100   HGB 16.0 04/30/2020 1100   HCT 51.0 04/30/2020 1100   PLT 181 04/30/2020 1100   MCV 100.0 04/30/2020 1100   MCH 31.4 04/30/2020 1100   MCHC 31.4 04/30/2020 1100   RDW 12.5 04/30/2020 1100   LYMPHSABS 0.8 10/30/2015 0428   MONOABS  0.7 10/30/2015 0428   EOSABS 0.2 10/30/2015 0428   BASOSABS 0.0 10/30/2015 0428  Absolute Eos 10/30/15 200  Imaging, labs and test noted above have been reviewed independently by me.    Assessment & Plan:   Discussion: 75 year old male never smoker with CAD s/p CABG, DM2, HTN, allergic rhinitis who presents for chronic cough. Common causes of cough were discussed including upper airway cough syndrome, reflux and undiagnosed obstructive lung disease. Aspiration can also cause coughing episodes. We discussed work-up including PFTs, chest imaging and swallow evaluation. Will start with PFTs first and discuss additional testing after results are reviewed.  Chronic cough --We will arrange pulmonary function tests --Depending on the results, we will obtain CT scan of your chest if needed   Health Maintenance Immunization History  Administered Date(s) Administered  . DTaP 11/25/1994  . Influenza, High Dose Seasonal PF 10/02/2014, 08/01/2015, 07/20/2016  . Influenza, Quadrivalent, Recombinant, Inj, Pf 07/26/2017, 07/17/2019  . Influenza-Unspecified 10/11/2011, 12/04/2012, 09/24/2013, 08/15/2018  . PFIZER(Purple Top)SARS-COV-2 Vaccination 01/02/2020, 01/23/2020  . Pneumococcal Conjugate-13 05/24/2013  . Pneumococcal Polysaccharide-23 06/12/2019  . Tdap 05/24/2013   CT Lung Screen - not indicated  No orders of the defined types were placed in this encounter. No orders of the defined types were placed in this encounter.   Return for after PFTs.  I have spent a total time of 45-minutes on the day of the appointment reviewing prior documentation, coordinating care and discussing medical diagnosis and plan with the patient/family. Imaging, labs and tests included in this note have been reviewed and interpreted independently by me.  [place], MD [facility] 12/09/2020 10:38 AM  Office Number [phone]

## 2020-12-09 NOTE — Patient Instructions (Signed)
Chronic cough --We will arrange pulmonary function tests --Depending on the results, we will obtain CT scan of your chest if needed  Follow-up with me in clinic to discuss test results as soon as available

## 2020-12-10 ENCOUNTER — Ambulatory Visit: Payer: Medicare Other | Admitting: Student

## 2020-12-10 NOTE — Progress Notes (Signed)
Primary Physician:  Merrilee Seashore, MD   [name], male    DOB: 1946-08-29, 75 y.o.   MRN: [id number]  Subjective:    Chief Complaint  Patient presents with  . Coronary Artery Disease    Angina, test results    HPI: Jesus Young  is a 75 y.o. male  with known coronary artery disease s/p CABG in 2012, left bundle branch block that was noted 2014, hypertension, diabetes, hyperlipidemia and chronic kidney disease..  Due to complaints of left arm pain with exertion, underwent lexiscan nuclear stress test on 08/07/2018 considered high risk study due to LVEF; however, on echocardiogram had normal LVEF. No significant changes were noted compared to the 2014, but in view of continued symptoms despite medical management, he underwent coronary angiogram on 12/05/2018 and patient was noted to have small vessel disease and microvascular disease.  Patient presents for 1 month follow up of CAD, angina, and results of cardiac testing. At last visit no changes were made to patient's medications, but did order echocardiogram and nuclear stress testing in view of patient's anginal symptoms. Patient reports he has had no recurrence of chest pressure or left arm pain since last visit. Although he does admit due to the cold weather he has not been as active as of late. He did recently shovel a significant amount of snow without issue.   Past Medical History:  Diagnosis Date  . Anxiety   . Arthritis    back and knees  . Bronchitis    hx of 2015  . [facility])    prostate  . Cataract    immature on right  . Chronic back pain   . CKD (chronic kidney disease)   . Constipation    takes Colace at bedtime  . Coronary artery disease    takes Plavix daily but is on hold for surgery  . Depression   . Diabetes mellitus without complication ([place])    takes Levemir and Humalog daily  . Dizziness   . Fall    hx of with injury to left shoulder   . GERD (gastroesophageal  reflux disease)    takes Protonix daily and Zantac  . H/O urinary frequency   . Hemorrhoids   . History of bladder infections   . History of kidney stones   . Hyperlipidemia    takes Zocor every evening  . Hypertension    takes Metoprolol daily  . Hypothyroidism    takes Synthroid daily  . Left bundle branch block   . Myocardial infarction [facility])    many yrs before 2002  . Night muscle spasms    takes Robaxin 4 x day   . Nocturia     Past Surgical History:  Procedure Laterality Date  . CARDIAC CATHETERIZATION  05/17/02  . COLONOSCOPY    . CORONARY ANGIOPLASTY     x 1   . CORONARY ARTERY BYPASS GRAFT  2002   x 5  . eyelid surgery     . INGUINAL HERNIA REPAIR Left 05/06/2020   Procedure: LEFT INGUINAL HERNIA REPAIR;  Surgeon: Donnie Mesa, MD;  Location: [place];  Service: General;  Laterality: Left;  . INSERTION OF MESH Left 05/06/2020   Procedure: INSERTION OF MESH;  Surgeon: Donnie Mesa, MD;  Location: [place];  Service: General;  Laterality: Left;  . LEFT HEART CATH AND CORS/GRAFTS ANGIOGRAPHY N/A 12/05/2018   Procedure: LEFT HEART CATH AND CORS/GRAFTS ANGIOGRAPHY;  Surgeon: Adrian Prows, MD;  Location:  [facility];  Service: Cardiovascular;  Laterality: N/A;  . LUMBAR LAMINECTOMY/DECOMPRESSION MICRODISCECTOMY Bilateral 07/18/2013   Procedure: Bilateral Lumbar four-five Lumbar Laminotomy/Microdiskectomy;  Surgeon: Hosie Spangle, MD;  Location: [place] [facility];  Service: Neurosurgery;  Laterality: Bilateral;  Bilateral Lumbar four-five Lumbar Laminotomy/Microdiskectomy  . NEPHROLITHOTOMY Left 06/23/2015   Procedure: NEPHROLITHOTOMY PERCUTANEOUS;  Surgeon: [name], MD;  Location: [facility];  Service: Urology;  Laterality: Left;  . PROSTATECTOMY  10/2009  . skin spots removed from back -precancerous    . TONSILLECTOMY     Social History   Tobacco Use  . Smoking status: Never Smoker  . Smokeless tobacco: Never Used  Substance Use Topics  . Alcohol use: No  Martial  status: Married   ROS   Review of Systems  Constitutional: Negative for decreased appetite, malaise/fatigue, weight gain and weight loss.  Eyes: Negative for visual disturbance.  Cardiovascular: Negative for chest pain (no reccurence since last visit), claudication, dyspnea on exertion, leg swelling, near-syncope, orthopnea, palpitations, paroxysmal nocturnal dyspnea and syncope.  Respiratory: Negative for hemoptysis, shortness of breath and wheezing.   Endocrine: Negative for cold intolerance and heat intolerance.  Hematologic/Lymphatic: Does not bruise/bleed easily.  Skin: Negative for nail changes and rash.  Musculoskeletal: Negative for muscle weakness and myalgias.  Gastrointestinal: Negative for abdominal pain, change in bowel habit, melena, nausea and vomiting.  Neurological: Negative for difficulty with concentration, dizziness, focal weakness, headaches and weakness.  Psychiatric/Behavioral: Negative for altered mental status and suicidal ideas.  All other systems reviewed and are negative.     Objective:  Blood pressure 137/75, pulse 70, temperature 97.8 F (36.6 C), height 6\' 3"  (1.905 m), weight 201 lb (91.2 kg), SpO2 96 %. Body mass index is 25.12 kg/m.     Physical Exam Vitals reviewed.  Constitutional:      Appearance: He is well-developed.  HENT:     Head: Normocephalic and atraumatic.  Cardiovascular:     Rate and Rhythm: Normal rate and regular rhythm.     Pulses: Intact distal pulses.          Carotid pulses are 2+ on the right side and 2+ on the left side.      Radial pulses are 2+ on the right side and 2+ on the left side.       Femoral pulses are 2+ on the right side and 2+ on the left side.      Popliteal pulses are 2+ on the right side and 2+ on the left side.       Dorsalis pedis pulses are 2+ on the right side and 2+ on the left side.       Posterior tibial pulses are 2+ on the right side and 2+ on the left side.     Heart sounds: Normal heart  sounds, S1 normal and S2 normal. No murmur heard. No gallop.   Pulmonary:     Effort: Pulmonary effort is normal. No accessory muscle usage or respiratory distress.     Breath sounds: Normal breath sounds. No wheezing, rhonchi or rales.  Abdominal:     General: Bowel sounds are normal.     Palpations: Abdomen is soft.  Musculoskeletal:        General: Normal range of motion.     Cervical back: Normal range of motion.     Right lower leg: No edema.     Left lower leg: No edema.  Skin:    General: Skin is warm and dry.  Neurological:     Mental Status: He is alert and oriented to person, place, and time.    Laboratory examination:    CMP Latest Ref Rng & Units 04/30/2020 10/22/2017 11/21/2016  Glucose 70 - 99 mg/dL 143(H) 119(H) 226(H)  BUN 8 - 23 mg/dL 23 21(H) 36(H)  Creatinine 0.61 - 1.24 mg/dL 1.76(H) 1.42(H) 1.57(H)  Sodium 135 - 145 mmol/L 136 140 137  Potassium 3.5 - 5.1 mmol/L 4.6 4.0 5.1  Chloride 98 - 111 mmol/L 106 106 102  CO2 22 - 32 mmol/L 22 25 24   Calcium 8.9 - 10.3 mg/dL 9.1 9.2 8.8(L)  Total Protein 6.5 - 8.1 g/dL - - 6.6  Total Bilirubin 0.3 - 1.2 mg/dL - - 0.9  Alkaline Phos 38 - 126 U/L - - 46  AST 15 - 41 U/L - - 37  ALT 17 - 63 U/L - - 47   CBC Latest Ref Rng & Units 04/30/2020 12/06/2018 12/05/2018  WBC 4.0 - 10.5 K/uL 6.0 - -  Hemoglobin 13.0 - 17.0 g/dL 16.0 14.2 14.3  Hematocrit 39.0 - 52.0 % 51.0 43.7 44.9  Platelets 150 - 400 K/uL 181 - -   Lipid Panel  No results found for: CHOL, TRIG, HDL, CHOLHDL, VLDL, LDLCALC, LDLDIRECT HEMOGLOBIN A1C Lab Results  Component Value Date   HGBA1C 6.9 (H) 04/30/2020   MPG 151 04/30/2020   TSH No results for input(s): TSH in the last 8760 hours.  External Labs:   10/21/2020: HDL 33, LDL 70, total cholesterol 132, triglycerides 167 TSH 1.62 BUN 28  04/30/2020: A1c 6.9%, serum creatinine 1.76   Allergies and Medications    Allergies  Allergen Reactions  . Clonazepam     Headache   . Lisinopril      headache    Current Outpatient Medications  Medication Instructions  . acetaminophen (TYLENOL) 1,300 mg, Oral, Every 8 hours PRN, PRN  . amLODipine (NORVASC) 10 mg, Oral, Daily  . aspirin EC 81 mg, Oral, Daily at bedtime  . B-D ULTRAFINE III SHORT PEN 31G X 8 MM MISC USE UTD  . busPIRone (BUSPAR) 15 mg, Oral, 2 times daily  . clopidogrel (PLAVIX) 75 mg, Oral, Daily at bedtime  . docusate sodium (COLACE) 400 mg, Oral, Daily at bedtime  . doxycycline (VIBRA-TABS) 100 mg, Oral, 2 times daily  . famotidine (PEPCID) 20 mg, Oral, Daily  . insulin detemir (LEVEMIR) 16 Units, Subcutaneous, 2 times daily  . insulin lispro (HUMALOG) 5-9 Units, Subcutaneous, 2 times daily, 5 units at lunch, and 9 units after dinner.  . isosorbide mononitrate (IMDUR) 120 mg, Oral, Daily  . levothyroxine (SYNTHROID) 100 mcg, Oral, See admin instructions, Take 1 tablet (100 mcg) by mouth daily in the morning, except on Sundays  . meclizine (ANTIVERT) 25 mg, Oral, Daily PRN  . metoprolol tartrate (LOPRESSOR) 50 mg, Oral, 2 times daily  . nitroGLYCERIN (NITROSTAT) 0.4 mg, Sublingual, Every 5 min PRN  . NOVOLOG 100 UNIT/ML injection Inject into the skin.  . ONE TOUCH ULTRA TEST test strip No dose, route, or frequency recorded.  Marland Kitchen oxyCODONE (OXY IR/ROXICODONE) 5 mg, Oral, Every 6 hours PRN  . pantoprazole (PROTONIX) 40 mg, Oral, Daily at bedtime  . ranolazine (RANEXA) 500 MG 12 hr tablet Take 2 tablets by mouth twice daily  . rosuvastatin (CRESTOR) 20 MG tablet TAKE 1 TABLET BY MOUTH EVERY DAY. DISCONTINUE SIMVASTATIN    Radiology:  No results found.  Cardiac Studies:   Echocardiogram  08/19/2018: Left ventricle cavity is  normal in size. Abnormal septal wall motion due to left bundle branch block. Doppler evidence of grade I (impaired) diastolic dysfunction, normal LAP. Low normal LV systolic function. Visual EF is 50-55%. Calculated EF 58%. Structurally normal tricuspid valve with trace  regurgitation. Structurally normal pulmonic valve with trace regurgitation. Compared to the study done on 01/22/2013, no significant change  Lexiscan myoview stress test 08/07/2018: 1. Lexiscan stress test was performed. Exercise capacity was not assessed. No stress symptoms reported. Peak effect blood pressure was 164/88 mmHg. The resting and stress electrocardiogram demonstrated normal sinus rhythm and LBBB. Stress EKG is non diagnostic for ischemia as it is a pharmacologic stress. 2. The overall quality of the study is good. Left ventricular cavity is noted to be normal on the rest and stress studies. Gated SPECT images reveal normal myocardial thickening and wall motion. The left ventricular ejection fraction was calculated at 32%, although visually appears near normal. Recommend correlation with echocardiogram. Mid inferior perfusion defect seen worse on rest images without corresponding wall motion abnormality likely represents tissue attenuation artifact. 3. High risk study due to low LVEF. Clinical and echocardiographic correlation recomme  Coronary angiogram 12/05/2018: Left main diffusely diseased and occluded proximal LAD and circumflex pulmonary artery. RCA occluded in the proximal segment. SVG to RCA is patent, diffusely diseased PL branch with high-grade stenosis of 90 to 99% and PDA has about a 60 to 70% mid stenosis. Small vessels. LAD is diffusely diseased. It is occluded in the proximal segment. Supplied by LIMA to LAD. Distal to the insertion, there is a 60 to 70% diffuse disease. Small vessel distally. Free radial graft to OM1 is widely patent. Distal circumflex has a high-grade lesion, small vessel. SVG to D1 is occluded. Normal LV systolic function, mild posterior hypokinesis. Normal LVEDP. EF estimated at 55%.  [facility] ECHOCARDIOGRAM COMPLETE 11/18/2020 Mildly depressed LV systolic function with visual EF 45-50%. Hypokinesis of the anterior and anteroseptal segments.  Left ventricle cavity is normal in size. Normal global wall motion. Doppler evidence of grade I (impaired) diastolic dysfunction, normal LAP. No significant valvular heart disease. Compared to prior study dated 08/19/2018: LVEF was 50-55% with abnormal septal motion due to LBBB.   PCV MYOCARDIAL PERFUSION WITH LEXISCAN 11/24/2020 Lexiscan nuclear stress test performed using 1-day protocol. Rest and stress EKG showed sinus rhythm, IVCD/tachycardia. Normal myocardial perfusion. Stress LVEF 25%. High risk study due to low stress LVEF.   EKG:   EKG 11/12/2020: Sinus rhythm at a rate of 70 bpm, left atrial enlargement.  Left bundle branch block, no further analysis.  Compared to EKG 11/13/2019, no significant change.  Assessment:     ICD-10-CM   1. Coronary artery disease involving native coronary artery of native heart with other form of angina pectoris ([place])  I25.118   2. Essential hypertension  I10     Meds ordered this encounter  Medications  . amLODipine (NORVASC) 5 MG tablet    Sig: Take 2 tablets (10 mg total) by mouth daily.    Dispense:  90 tablet    Refill:  2   Medications Discontinued During This Encounter  Medication Reason  . amLODipine (NORVASC) 5 MG tablet      Recommendations:   Jesus Young  is a 75 y.o. male  with known coronary artery disease s/p CABG in 2012, left bundle branch block that was noted 2014, hypertension, diabetes, hyperlipidemia and CKD stage 3.  Patient presents for 1 month follow-up of anginal symptoms and results of cardiac testing.  Since last visit patient has had no recurrence of angina, and is doing well overall.  Reviewed and discussed results of echocardiogram and stress test with patient.  Echocardiogram revealed LVEF of 45-50% with abnormal wall motion likely secondary to left bundle branch block.  Notably LVEF is slightly reduced compared to previous 50-55%.  Nuclear stress test revealed normal myocardial perfusion and EKG  without ischemic changes.  Stress test did reveal stress LVEF of 25%, which I discussed with Dr. Einar Gip who advised this is likely an error secondary to known left bundle branch block.  In view of relatively low risk nuclear stress test and stable echocardiogram, repeat coronary angiography is not indicated at this time but rather recommend medical management of symptoms.  We will continue aspirin, Plavix, Imdur, metoprolol, Ranexa, and Crestor, as well as as needed sublingual nitroglycerin use.  Will increase amlodipine from 5 mg to 10 mg daily for additional antianginal therapy.  Patient will notify our office if he experiences any issues with increased dosing of amlodipine.  Follow-up in 6 weeks, sooner if needed, for CAD and anginal symptoms.  Patient was seen in collaboration with Dr. Einar Gip and he is in agreement of the plan.    Alethia Berthold, PA-C 12/16/2020, 1:48 PM Office: [phone]

## 2020-12-10 NOTE — Telephone Encounter (Signed)
Returned call. No further action needed.

## 2020-12-11 ENCOUNTER — Encounter: Payer: Self-pay | Admitting: Pulmonary Disease

## 2020-12-12 ENCOUNTER — Encounter: Payer: Self-pay | Admitting: Student

## 2020-12-12 ENCOUNTER — Other Ambulatory Visit: Payer: Self-pay

## 2020-12-12 ENCOUNTER — Ambulatory Visit: Payer: Medicare Other | Admitting: Student

## 2020-12-12 VITALS — BP 137/75 | HR 70 | Temp 97.8°F | Ht 75.0 in | Wt 201.0 lb

## 2020-12-12 DIAGNOSIS — I10 Essential (primary) hypertension: Secondary | ICD-10-CM | POA: Diagnosis not present

## 2020-12-12 DIAGNOSIS — I25118 Atherosclerotic heart disease of native coronary artery with other forms of angina pectoris: Secondary | ICD-10-CM | POA: Diagnosis not present

## 2020-12-12 MED ORDER — AMLODIPINE BESYLATE 5 MG PO TABS
10.0000 mg | ORAL_TABLET | Freq: Every day | ORAL | 2 refills | Status: DC
Start: 2020-12-12 — End: 2020-12-30

## 2020-12-24 ENCOUNTER — Other Ambulatory Visit: Payer: Self-pay | Admitting: Cardiology

## 2020-12-24 DIAGNOSIS — E782 Mixed hyperlipidemia: Secondary | ICD-10-CM

## 2020-12-25 ENCOUNTER — Other Ambulatory Visit (HOSPITAL_COMMUNITY)
Admission: RE | Admit: 2020-12-25 | Discharge: 2020-12-25 | Disposition: A | Discharge status: Home / Self Care | Payer: Medicare Other | Source: Ambulatory Visit | Attending: Pulmonary Disease | Admitting: Pulmonary Disease

## 2020-12-25 DIAGNOSIS — Z01812 Encounter for preprocedural laboratory examination: Secondary | ICD-10-CM | POA: Insufficient documentation

## 2020-12-25 DIAGNOSIS — Z20822 Contact with and (suspected) exposure to covid-19: Secondary | ICD-10-CM | POA: Insufficient documentation

## 2020-12-25 LAB — SARS CORONAVIRUS 2 (TAT 6-24 HRS): SARS Coronavirus 2: NEGATIVE

## 2020-12-26 ENCOUNTER — Other Ambulatory Visit: Payer: Self-pay | Admitting: *Deleted

## 2020-12-26 DIAGNOSIS — R053 Chronic cough: Secondary | ICD-10-CM

## 2020-12-29 ENCOUNTER — Other Ambulatory Visit: Payer: Self-pay

## 2020-12-29 ENCOUNTER — Encounter: Payer: Self-pay | Admitting: Pulmonary Disease

## 2020-12-29 ENCOUNTER — Ambulatory Visit (INDEPENDENT_AMBULATORY_CARE_PROVIDER_SITE_OTHER): Payer: Medicare Other | Admitting: Pulmonary Disease

## 2020-12-29 ENCOUNTER — Ambulatory Visit: Payer: Medicare Other | Admitting: Pulmonary Disease

## 2020-12-29 VITALS — BP 126/84 | HR 72 | Temp 97.2°F | Ht 76.0 in | Wt 202.0 lb

## 2020-12-29 DIAGNOSIS — R053 Chronic cough: Secondary | ICD-10-CM

## 2020-12-29 DIAGNOSIS — J3089 Other allergic rhinitis: Secondary | ICD-10-CM

## 2020-12-29 LAB — PULMONARY FUNCTION TEST
DL/VA % pred: 114 %
DL/VA: 4.45 ml/min/mmHg/L
DLCO cor % pred: 70 %
DLCO cor: 21.27 ml/min/mmHg
DLCO unc % pred: 70 %
DLCO unc: 21.27 ml/min/mmHg
FEF 25-75 Post: 2.37 L/sec
FEF 25-75 Pre: 2.52 L/sec
FEF2575-%Change-Post: -5 %
FEF2575-%Pred-Post: 82 %
FEF2575-%Pred-Pre: 87 %
FEV1-%Change-Post: 0 %
FEV1-%Pred-Post: 73 %
FEV1-%Pred-Pre: 74 %
FEV1-Post: 2.88 L
FEV1-Pre: 2.89 L
FEV1FVC-%Change-Post: 3 %
FEV1FVC-%Pred-Pre: 104 %
FEV6-%Change-Post: -3 %
FEV6-%Pred-Post: 72 %
FEV6-%Pred-Pre: 74 %
FEV6-Post: 3.63 L
FEV6-Pre: 3.78 L
FEV6FVC-%Pred-Post: 105 %
FEV6FVC-%Pred-Pre: 105 %
FVC-%Change-Post: -3 %
FVC-%Pred-Post: 68 %
FVC-%Pred-Pre: 70 %
FVC-Post: 3.63 L
FVC-Pre: 3.78 L
Post FEV1/FVC ratio: 79 %
Post FEV6/FVC ratio: 100 %
Pre FEV1/FVC ratio: 76 %
Pre FEV6/FVC Ratio: 100 %
RV % pred: 77 %
RV: 2.22 L
TLC % pred: 75 %
TLC: 6.22 L

## 2020-12-29 NOTE — Progress Notes (Signed)
Full PFT performed today. °

## 2020-12-29 NOTE — Patient Instructions (Addendum)
Chronic cough --CT Chest without contrast --Will consider swallow evaluation if cough worsens  Nasal congestion --Continue benadryl 25 mg twice daily --START flonase (generic: fluticasone). One spray per nare nightly  Follow-up in 3 months with me or sooner if needed

## 2020-12-29 NOTE — Progress Notes (Signed)
Subjective:   [name] GENDER: male DOB: Oct 13, 1946, MRN: [id number]   HPI  Chief Complaint  Patient presents with  . Follow-up    Cough improved since last visit. Taking benadryl.     Reason for Visit: Follow-up for cough  Jesus Young is a 75 year old male never smoker with CAD s/p CABG, DM2, HTN, allergic rhinitis who presents for chronic cough.  Synopsis: He has been seen by his PCP regarding chronic cough for >5 years. On benadryl in the last 2 months which seems to help decrease in frequency. He still has nasal congestion and runny nose. Not currently on nasal spray. Currently on famotidine and protonix daily though he does have occasional bloating and gas feeling. In the mornings he reports vague chest fullness/pressure/tightness. In the last three months, when he coughs in the morning, he will bring up possibly 1/3 of dixie cup of sputum.   Since our last visit, he has been taking benadryl 25 mg twice a day. Cough with minimal sputum production. Though improved cough does seem to occur when eating. On prior telephone call he reports that he will sometimes choke on food and need help from his wife to help. He has slowed down his eating to compensate. He does still have nasal congestion as well even with benadryl use. He completed PFTs for this visit and felt some difficulty with the test due to narrowing his airflow into the mouthpiece.  Social History: Never smoker  Second hand smoke Insurance underwriter  I have personally reviewed patient's past medical/family/social history/allergies/current medications.  Past Medical History:  Diagnosis Date  . Anxiety   . Arthritis    back and knees  . Bronchitis    hx of 2015  . Cancer [facility])    prostate  . Cataract    immature on right  . Chronic back pain   . CKD (chronic kidney disease)   . Constipation    takes Colace at bedtime  . Coronary artery disease    takes Plavix daily  but is on hold for surgery  . Depression   . Diabetes mellitus without complication ([place])    takes Levemir and Humalog daily  . Dizziness   . Fall    hx of with injury to left shoulder   . GERD (gastroesophageal reflux disease)    takes Protonix daily and Zantac  . H/O urinary frequency   . Hemorrhoids   . History of bladder infections   . History of kidney stones   . Hyperlipidemia    takes Zocor every evening  . Hypertension    takes Metoprolol daily  . Hypothyroidism    takes Synthroid daily  . Left bundle branch block   . Myocardial infarction [facility])    many yrs before 2002  . Night muscle spasms    takes Robaxin 4 x day   . Nocturia      Family History  Problem Relation Age of Onset  . Heart disease Father   . Heart attack Father   . Thyroid disease Sister      Social History   Occupational History  . Not on file  Tobacco Use  . Smoking status: Never Smoker  . Smokeless tobacco: Never Used  Vaping Use  . Vaping Use: Never used  Substance and Sexual Activity  . Alcohol use: No  . Drug use: No  . Sexual activity: Not Currently    Allergies  Allergen Reactions  .  Clonazepam     Headache   . Lisinopril     headache     Outpatient Medications Prior to Visit  Medication Sig Dispense Refill  . acetaminophen (TYLENOL) 650 MG CR tablet Take 1,300 mg by mouth every 8 (eight) hours as needed for pain. PRN    . amLODipine (NORVASC) 5 MG tablet Take 2 tablets (10 mg total) by mouth daily. 90 tablet 2  . aspirin EC 81 MG tablet Take 81 mg by mouth at bedtime.     . B-D ULTRAFINE III SHORT PEN 31G X 8 MM MISC USE UTD  0  . busPIRone (BUSPAR) 15 MG tablet Take 15 mg by mouth 2 (two) times daily.    . clopidogrel (PLAVIX) 75 MG tablet Take 75 mg by mouth at bedtime.    . docusate sodium (COLACE) 100 MG capsule Take 400 mg by mouth at bedtime.    Marland Kitchen doxycycline (VIBRA-TABS) 100 MG tablet Take 100 mg by mouth 2 (two) times daily.    . famotidine (PEPCID) 40 MG  tablet Take 20 mg by mouth daily.    . insulin detemir (LEVEMIR) 100 UNIT/ML injection Inject 0.16 mLs (16 Units total) into the skin 2 (two) times daily. (Patient taking differently: Inject 18 Units into the skin 2 (two) times daily.) 10 mL 1  . insulin lispro (HUMALOG) 100 UNIT/ML injection Inject 0.05-0.09 mLs (5-9 Units total) into the skin 2 (two) times daily. 5 units at lunch, and 9 units after dinner. (Patient taking differently: Inject 5-15 Units into the skin 2 (two) times daily. 5-15 units as needed  (sliding scale insulin)) 10 mL 1  . isosorbide mononitrate (IMDUR) 120 MG 24 hr tablet Take 1 tablet (120 mg total) by mouth daily. 90 tablet 3  . levothyroxine (SYNTHROID, LEVOTHROID) 100 MCG tablet Take 100 mcg by mouth See admin instructions. Take 1 tablet (100 mcg) by mouth daily in the morning, except on Sundays    . meclizine (ANTIVERT) 25 MG tablet Take 25 mg by mouth daily as needed.    . metoprolol (LOPRESSOR) 50 MG tablet Take 50 mg by mouth 2 (two) times daily.     . nitroGLYCERIN (NITROSTAT) 0.4 MG SL tablet Place 1 tablet (0.4 mg total) under the tongue every 5 (five) minutes as needed for chest pain. 15 tablet 3  . NOVOLOG 100 UNIT/ML injection Inject into the skin. (Patient not taking: Reported on 12/12/2020)    . ONE TOUCH ULTRA TEST test strip     . oxyCODONE (OXY IR/ROXICODONE) 5 MG immediate release tablet Take 1 tablet (5 mg total) by mouth every 6 (six) hours as needed for moderate pain, severe pain or breakthrough pain. (Patient not taking: Reported on 12/12/2020) 20 tablet 0  . pantoprazole (PROTONIX) 40 MG tablet Take 40 mg by mouth at bedtime.     . ranolazine (RANEXA) 500 MG 12 hr tablet Take 2 tablets by mouth twice daily 120 tablet 2  . rosuvastatin (CRESTOR) 20 MG tablet TAKE 1 TABLET BY MOUTH EVERY DAY. DISCONTINUE SIMVASTATIN 90 tablet 3   No facility-administered medications prior to visit.    Review of Systems  Constitutional: Negative for chills, diaphoresis,  fever, malaise/fatigue and weight loss.  HENT: Positive for congestion.   Respiratory: Positive for cough. Negative for hemoptysis, sputum production, shortness of breath and wheezing.   Cardiovascular: Negative for chest pain, palpitations and leg swelling.     Objective:   Vitals:   12/29/20 1611  BP: 126/84  Pulse:  72  Temp: (!) 97.2 F (36.2 C)  SpO2: 97%  Weight: 202 lb (91.6 kg)  Height: 6\' 4"  (1.93 m)      Physical Exam: General: Well-appearing, no acute distress HENT: [place], AT Eyes: EOMI, no scleral icterus Respiratory: Clear to auscultation bilaterally.  No crackles, wheezing or rales Cardiovascular: RRR, -M/R/G, no JVD Extremities:-Edema,-tenderness Neuro: AAO x4, CNII-XII grossly intact Psych: Normal mood, normal affect   Data Reviewed:  Imaging: CXR 10/30/15 S/p CABG. No infiltrate, effusion or edema CT Chest 01/09/21 - S/p CABG changes. Mild biapical pleuroparenchymal scarring. Scattered calcified granulomas. Subsegmental atelectasis in the lung bases.  PFT: 12/29/20 FVC 3.63 (68%) FEV1 2.88 (73%) Ratio 76  TLC 75% DLCO 70% Interpretation: Mild restrictive defect with mildly reduced DLCO   Labs: CBC    Component Value Date/Time   WBC 6.0 04/30/2020 1100   RBC 5.10 04/30/2020 1100   HGB 16.0 04/30/2020 1100   HCT 51.0 04/30/2020 1100   PLT 181 04/30/2020 1100   MCV 100.0 04/30/2020 1100   MCH 31.4 04/30/2020 1100   MCHC 31.4 04/30/2020 1100   RDW 12.5 04/30/2020 1100   LYMPHSABS 0.8 10/30/2015 0428   MONOABS 0.7 10/30/2015 0428   EOSABS 0.2 10/30/2015 0428   BASOSABS 0.0 10/30/2015 0428  Absolute Eos 10/30/15 200  Imaging, labs and test noted above have been reviewed independently by me.    Assessment & Plan:   Discussion: 75 year old male never smoker with CAD s/p CABG, DM2, HTN and allergic rhinitis who presents for follow-up. We reviewed PFTs this visit which demonstrated mild restrictive defect with mildly reduced gas exchange. Will  obtain CT chest to rule out possible lung parenchyma abnormalities but if this is normal his PFT findings may be related to inadequate effort during testing.  Chronic cough --CT Chest without contrast --Will consider swallow evaluation if cough worsens  Nasal congestion --Continue benadryl 25 mg twice daily --START flonase (generic: fluticasone). One spray per nare nightly  ADDENDUM: Updated patient on CT findings on 01/14/21. Mild pleuroparenchymal scarring and subsegemental atelectasis seen in the lung bases. This may explain his mild restrictive defect. He does have mild/minimal atelectasis that may be contributing to his cough. Recommended deep breathing exercises and huff maneuvers with resources provided over the phone (patient does not have MyChart). Also recommended continuing flonase and antihistamine. He has difficulty with the packaging of the Benadryl and was wanting another option for management of his allergic rhinitis. Montelukast was discussed and prescribed with refills. Also offered swallow evaluation however will hold off as he feels that he is making effort to prevent possible aspiration. Plan for follow-up in 3 months.   Health Maintenance Immunization History  Administered Date(s) Administered  . DTaP 11/25/1994  . Influenza, High Dose Seasonal PF 10/02/2014, 08/01/2015, 07/20/2016  . Influenza, Quadrivalent, Recombinant, Inj, Pf 07/26/2017, 07/17/2019  . Influenza-Unspecified 10/11/2011, 12/04/2012, 09/24/2013, 08/15/2018  . PFIZER(Purple Top)SARS-COV-2 Vaccination 01/02/2020, 01/23/2020  . Pneumococcal Conjugate-13 05/24/2013  . Pneumococcal Polysaccharide-23 06/12/2019  . Tdap 05/24/2013   CT Lung Screen - not indicated  Orders Placed This Encounter  Procedures  . CT Chest Wo Contrast    Judeen Hammans called [facility] Medicare    Standing Status:   Future    Number of Occurrences:   1    Standing Expiration Date:   12/29/2021    Order Specific Question:   Preferred  imaging location?    Answer:   [place] [facility] - [employer]  No orders of the defined  types were placed in this encounter.   No follow-ups on file.  I have spent a total time of 45-minutes on the day of the appointment reviewing prior documentation, coordinating care and discussing medical diagnosis and plan with the patient/family. Imaging, labs and tests included in this note have been reviewed and interpreted independently by me.  Brad[place], MD [facility] 12/29/2020 4:10 PM  Office Number [phone]

## 2020-12-30 ENCOUNTER — Other Ambulatory Visit: Payer: Self-pay

## 2020-12-30 MED ORDER — AMLODIPINE BESYLATE 5 MG PO TABS: 10.0000 mg | ORAL_TABLET | Freq: Every day | ORAL | 2 refills | Status: DC

## 2021-01-02 ENCOUNTER — Telehealth: Payer: Self-pay | Admitting: Pulmonary Disease

## 2021-01-02 ENCOUNTER — Other Ambulatory Visit: Payer: Medicare Other

## 2021-01-02 MED ORDER — DIPHENHYDRAMINE HCL 25 MG PO CAPS: 25.0000 mg | ORAL_CAPSULE | Freq: Four times a day (QID) | ORAL | 6 refills | Status: DC | PRN

## 2021-01-02 NOTE — Telephone Encounter (Signed)
Ok to take additional dose. Please separate pills by every 6 hours. Prescription sent to the pharmacy.   JE

## 2021-01-02 NOTE — Telephone Encounter (Signed)
Called and spoke with pt letting him know the info stated by Dr. Loanne Drilling and also let him know that she sent Rx to pharmacy for him. Pt verbalized understanding. Nothing further needed.

## 2021-01-02 NOTE — Telephone Encounter (Signed)
Assessment & Plan from OV 12/29/20:   Discussion: 75 year old male never smoker with CAD s/p CABG, DM2, HTN, allergic rhinitis who presents for chronic cough. Common causes of cough were discussed including upper airway cough syndrome, reflux and undiagnosed obstructive lung disease. Aspiration can also cause coughing episodes. We discussed work-up including PFTs, chest imaging and swallow evaluation. Will start with PFTs first and discuss additional testing after results are reviewed.  75 year old male never smoker  Chronic cough --CT Chest without contrast --Will consider swallow evaluation if cough worsens  Nasal congestion --Continue benadryl 25 mg twice daily --START flonase (generic: fluticasone). One spray per nare nightly    Called and spoke with pt who stated the dose of the benadryl he has is 25mg . Pt states that he has been taking this twice a day but wants to know if it would be okay to take an extra dose in the afternoon so him taking it three times a day instead of twice a day due to having some problems with the congestion and cough during the day along with first thing in the morning and at night when laying down.  Pt said that he has been using the flonase which has helped with his symptoms.  Dr. Loanne Drilling, please advise on the benadryl info for pt. Pt also would like to know if Rx could be sent to pharmacy for pt even though it is avail OTC so he can have a larger quantity than the 24-25 pills that comes in the pack if he gets it OTC.

## 2021-01-02 NOTE — Telephone Encounter (Signed)
pt is calling to speak with Dr. Loanne Drilling in regards to medication he had been speaking with her about, states that he didn't know the dosage for it.. (Benadryl) diphenhydramine 25mg , phenylephrine 10mg .. would like to know if theirs a larger quality than the 25 count package that he gets from drug store  please advise [phone]

## 2021-01-09 ENCOUNTER — Other Ambulatory Visit: Payer: Self-pay

## 2021-01-09 ENCOUNTER — Ambulatory Visit (INDEPENDENT_AMBULATORY_CARE_PROVIDER_SITE_OTHER)
Admission: RE | Admit: 2021-01-09 | Discharge: 2021-01-09 | Disposition: A | Discharge status: Home / Self Care | Payer: Medicare Other | Source: Ambulatory Visit | Attending: Pulmonary Disease | Admitting: Pulmonary Disease

## 2021-01-09 DIAGNOSIS — J8410 Pulmonary fibrosis, unspecified: Secondary | ICD-10-CM | POA: Diagnosis not present

## 2021-01-09 DIAGNOSIS — R053 Chronic cough: Secondary | ICD-10-CM | POA: Diagnosis not present

## 2021-01-09 DIAGNOSIS — J9811 Atelectasis: Secondary | ICD-10-CM | POA: Diagnosis not present

## 2021-01-09 DIAGNOSIS — I700 Atherosclerosis of aorta: Secondary | ICD-10-CM | POA: Diagnosis not present

## 2021-01-09 DIAGNOSIS — J984 Other disorders of lung: Secondary | ICD-10-CM | POA: Diagnosis not present

## 2021-01-14 ENCOUNTER — Encounter: Payer: Self-pay | Admitting: Pulmonary Disease

## 2021-01-14 MED ORDER — MONTELUKAST SODIUM 10 MG PO TABS: 10.0000 mg | ORAL_TABLET | Freq: Every day | ORAL | 11 refills | Status: DC

## 2021-01-19 ENCOUNTER — Telehealth: Payer: Self-pay | Admitting: Pulmonary Disease

## 2021-01-19 DIAGNOSIS — J029 Acute pharyngitis, unspecified: Secondary | ICD-10-CM | POA: Diagnosis not present

## 2021-01-19 DIAGNOSIS — U071 COVID-19: Secondary | ICD-10-CM | POA: Diagnosis not present

## 2021-01-19 DIAGNOSIS — R52 Pain, unspecified: Secondary | ICD-10-CM | POA: Diagnosis not present

## 2021-01-19 NOTE — Telephone Encounter (Signed)
Patient called and states he was supposed to call Dr. Loanne Drilling if he tested positive and he did.  Send to Dr. Loanne Drilling for further recommendations

## 2021-01-20 ENCOUNTER — Ambulatory Visit: Payer: Medicare Other | Admitting: Podiatry

## 2021-01-20 NOTE — Telephone Encounter (Signed)
Thank you for the update. Recommend supportive care with cough syrup and over the counter meds at this point in time. If his symptoms worsen, he can contact our office. Based on his age, he may be a candidate for monoclonal antibodies if he is interested.

## 2021-01-20 NOTE — Telephone Encounter (Signed)
Called and spoke with patient. He states he called out of a courtesy because a PA told him to. He is not having bad symptoms, just feel like a bad flu.  He did not want to be referred to the monoclonal antibodies.  He will call if symptoms worsen or needs anything further. Nothing further needed at this time.

## 2021-01-23 ENCOUNTER — Ambulatory Visit: Payer: Medicare Other | Admitting: Student

## 2021-01-27 ENCOUNTER — Other Ambulatory Visit: Payer: Self-pay | Admitting: Cardiology

## 2021-01-27 DIAGNOSIS — I25118 Atherosclerotic heart disease of native coronary artery with other forms of angina pectoris: Secondary | ICD-10-CM

## 2021-02-03 NOTE — Progress Notes (Signed)
Primary Physician:  Merrilee Seashore, MD   [name], male    DOB: [birth year], 76 y.o.   MRN: [id number]  Subjective:    Chief Complaint  Patient presents with  . Coronary Artery Disease  . Chest Pain  . Follow-up    HPI: Jesus Young  is a 75 y.o. male  with known coronary artery disease s/p CABG in 2012, left bundle branch block that was noted 2014, hypertension, diabetes, hyperlipidemia and chronic kidney disease. History of Covid 19 infection during March 2022.   Due to complaints of left arm pain with exertion, underwent lexiscan nuclear stress test on 08/07/2018 considered high risk study due to LVEF; however, on echocardiogram had normal LVEF. No significant changes were noted compared to the 2014, but in view of continued symptoms despite medical management, he underwent coronary angiogram on 12/05/2018 and patient was noted to have small vessel disease and microvascular disease.  Patient presents for 6-week follow-up of coronary artery disease and anginal symptoms.  At last visit increased amlodipine from 5 to 10 mg daily for antianginal benefit.  Previous nuclear stress test revealed LVEF of 25% which upon discussion with Dr. Einar Gip he advised this was likely an error secondary to known left bundle branch block.  Recommended medical management of patient's angina.  Patient reports his symptoms of angina have resolved with increased amlodipine, which he is tolerating well.  However he does note that he had COVID-19 infection earlier this month, and therefore has not been as active lately as previous.  He also has been following with pulmonology for evaluation of chronic cough.  Pulmonary provider, Dr. Loanne Drilling, reported mild restrictive disease pattern on patient's PFTs, however CT of the chest was unyielding for etiology of patient's chronic cough.  He was recommended to use Flonase on a regular basis.  Past Medical History:  Diagnosis Date  .  Anxiety   . Arthritis    back and knees  . Bronchitis    hx of 2015  . Cancer [facility])    prostate  . Cataract    immature on right  . Chronic back pain   . CKD (chronic kidney disease)   . Constipation    takes Colace at bedtime  . Coronary artery disease    takes Plavix daily but is on hold for surgery  . Depression   . Diabetes mellitus without complication ([place])    takes Levemir and Humalog daily  . Dizziness   . Fall    hx of with injury to left shoulder   . GERD (gastroesophageal reflux disease)    takes Protonix daily and Zantac  . H/O urinary frequency   . Hemorrhoids   . History of bladder infections   . History of kidney stones   . Hyperlipidemia    takes Zocor every evening  . Hypertension    takes Metoprolol daily  . Hypothyroidism    takes Synthroid daily  . Left bundle branch block   . Myocardial infarction [facility])    many yrs before 2002  . Night muscle spasms    takes Robaxin 4 x day   . Nocturia     Past Surgical History:  Procedure Laterality Date  . CARDIAC CATHETERIZATION  05/17/02  . COLONOSCOPY    . CORONARY ANGIOPLASTY     x 1   . CORONARY ARTERY BYPASS GRAFT  2002   x 5  . eyelid surgery     . INGUINAL HERNIA REPAIR Left  05/06/2020   Procedure: LEFT INGUINAL HERNIA REPAIR;  Surgeon: Donnie Mesa, MD;  Location: [place];  Service: General;  Laterality: Left;  . INSERTION OF MESH Left 05/06/2020   Procedure: INSERTION OF MESH;  Surgeon: Donnie Mesa, MD;  Location: [place];  Service: General;  Laterality: Left;  . LEFT HEART CATH AND CORS/GRAFTS ANGIOGRAPHY N/A 12/05/2018   Procedure: LEFT HEART CATH AND CORS/GRAFTS ANGIOGRAPHY;  Surgeon: Adrian Prows, MD;  Location: [place] CV LAB;  Service: Cardiovascular;  Laterality: N/A;  . LUMBAR LAMINECTOMY/DECOMPRESSION MICRODISCECTOMY Bilateral 07/18/2013   Procedure: Bilateral Lumbar four-five Lumbar Laminotomy/Microdiskectomy;  Surgeon: Hosie Spangle, MD;  Location: [place] NEURO ORS;  Service:  Neurosurgery;  Laterality: Bilateral;  Bilateral Lumbar four-five Lumbar Laminotomy/Microdiskectomy  . NEPHROLITHOTOMY Left 06/23/2015   Procedure: NEPHROLITHOTOMY PERCUTANEOUS;  Surgeon: [name], MD;  Location: [facility];  Service: Urology;  Laterality: Left;  . PROSTATECTOMY  10/2009  . skin spots removed from back -precancerous    . TONSILLECTOMY     Family History  Problem Relation Age of Onset  . Heart disease Father   . Heart attack Father   . Thyroid disease Sister    Social History   Tobacco Use  . Smoking status: Never Smoker  . Smokeless tobacco: Never Used  Substance Use Topics  . Alcohol use: No  Martial status: Married   ROS   Review of Systems  Constitutional: Negative for decreased appetite, malaise/fatigue, weight gain and weight loss.  Eyes: Negative for visual disturbance.  Cardiovascular: Negative for chest pain (no reccurence since last visit), claudication, dyspnea on exertion, leg swelling, near-syncope, orthopnea, palpitations, paroxysmal nocturnal dyspnea and syncope.  Respiratory: Negative for hemoptysis, shortness of breath and wheezing.   Endocrine: Negative for cold intolerance and heat intolerance.  Hematologic/Lymphatic: Does not bruise/bleed easily.  Skin: Negative for nail changes and rash.  Musculoskeletal: Negative for muscle weakness and myalgias.  Gastrointestinal: Negative for abdominal pain, change in bowel habit, melena, nausea and vomiting.  Neurological: Negative for dizziness.  All other systems reviewed and are negative.     Objective:  Blood pressure 128/66, pulse 69, temperature 97.9 F (36.6 C), height 6\' 4"  (1.93 m), weight 197 lb (89.4 kg), SpO2 96 %. Body mass index is 23.98 kg/m.     Physical Exam Vitals reviewed.  Constitutional:      Appearance: He is well-developed.  HENT:     Head: Normocephalic and atraumatic.  Cardiovascular:     Rate and Rhythm: Normal rate and regular rhythm.     Pulses: Intact distal  pulses.          Carotid pulses are 2+ on the right side and 2+ on the left side.      Radial pulses are 2+ on the right side and 2+ on the left side.       Femoral pulses are 2+ on the right side and 2+ on the left side.      Popliteal pulses are 2+ on the right side and 2+ on the left side.       Dorsalis pedis pulses are 2+ on the right side and 2+ on the left side.       Posterior tibial pulses are 2+ on the right side and 2+ on the left side.     Heart sounds: Normal heart sounds, S1 normal and S2 normal. No murmur heard. No gallop.   Pulmonary:     Effort: Pulmonary effort is normal. No accessory muscle usage or respiratory distress.  Breath sounds: Normal breath sounds. No wheezing, rhonchi or rales.  Musculoskeletal:        General: Normal range of motion.     Cervical back: Normal range of motion.     Right lower leg: Edema (1+ pitting at ankle ) present.     Left lower leg: Edema (1+ pitting at ankle ) present.  Skin:    General: Skin is warm and dry.  Neurological:     General: No focal deficit present.     Mental Status: He is alert and oriented to person, place, and time.    Laboratory examination:    CMP Latest Ref Rng & Units 04/30/2020 10/22/2017 11/21/2016  Glucose 70 - 99 mg/dL 143(H) 119(H) 226(H)  BUN 8 - 23 mg/dL 23 21(H) 36(H)  Creatinine 0.61 - 1.24 mg/dL 1.76(H) 1.42(H) 1.57(H)  Sodium 135 - 145 mmol/L 136 140 137  Potassium 3.5 - 5.1 mmol/L 4.6 4.0 5.1  Chloride 98 - 111 mmol/L 106 106 102  CO2 22 - 32 mmol/L 22 25 24   Calcium 8.9 - 10.3 mg/dL 9.1 9.2 8.8(L)  Total Protein 6.5 - 8.1 g/dL - - 6.6  Total Bilirubin 0.3 - 1.2 mg/dL - - 0.9  Alkaline Phos 38 - 126 U/L - - 46  AST 15 - 41 U/L - - 37  ALT 17 - 63 U/L - - 47   CBC Latest Ref Rng & Units 04/30/2020 12/06/2018 12/05/2018  WBC 4.0 - 10.5 K/uL 6.0 - -  Hemoglobin 13.0 - 17.0 g/dL 16.0 14.2 14.3  Hematocrit 39.0 - 52.0 % 51.0 43.7 44.9  Platelets 150 - 400 K/uL 181 - -   Lipid Panel  No  results found for: CHOL, TRIG, HDL, CHOLHDL, VLDL, LDLCALC, LDLDIRECT HEMOGLOBIN A1C Lab Results  Component Value Date   HGBA1C 6.9 (H) 04/30/2020   MPG 151 04/30/2020   TSH No results for input(s): TSH in the last 8760 hours.  External Labs:   10/21/2020: HDL 33, LDL 70, total cholesterol 132, triglycerides 167 TSH 1.62 BUN 28  04/30/2020: A1c 6.9%, serum creatinine 1.76   Allergies and Medications    Allergies  Allergen Reactions  . Clonazepam     Headache   . Lisinopril     headache    Current Outpatient Medications  Medication Instructions  . acetaminophen (TYLENOL) 1,300 mg, Oral, Every 8 hours PRN, PRN  . amLODipine (NORVASC) 10 mg, Oral, Daily  . aspirin EC 81 mg, Oral, Daily at bedtime  . B-D ULTRAFINE III SHORT PEN 31G X 8 MM MISC USE UTD  . busPIRone (BUSPAR) 15 mg, Oral, 2 times daily  . clopidogrel (PLAVIX) 75 mg, Oral, Daily at bedtime  . diphenhydrAMINE (BENADRYL ALLERGY) 25 mg, Oral, Every 6 hours PRN  . docusate sodium (COLACE) 400 mg, Oral, Daily at bedtime  . famotidine (PEPCID) 20 mg, Oral, Daily  . fluticasone (FLONASE) 50 MCG/ACT nasal spray 1 spray, Each Nare, Daily  . insulin detemir (LEVEMIR) 16 Units, Subcutaneous, 2 times daily  . insulin lispro (HUMALOG) 5-9 Units, Subcutaneous, 2 times daily, 5 units at lunch, and 9 units after dinner.  . isosorbide mononitrate (IMDUR) 120 mg, Oral, Daily  . levothyroxine (SYNTHROID) 100 mcg, Oral, See admin instructions, Take 1 tablet (100 mcg) by mouth daily in the morning, except on Sundays  . metoprolol tartrate (LOPRESSOR) 50 mg, Oral, 2 times daily  . montelukast (SINGULAIR) 10 mg, Oral, Daily at bedtime  . nitroGLYCERIN (NITROSTAT) 0.4 mg, Sublingual, Every 5 min  PRN  . ONE TOUCH ULTRA TEST test strip No dose, route, or frequency recorded.  . pantoprazole (PROTONIX) 40 mg, Oral, Daily at bedtime  . ranolazine (RANEXA) 500 MG 12 hr tablet Take 2 tablets by mouth twice daily  . rosuvastatin  (CRESTOR) 20 MG tablet TAKE 1 TABLET BY MOUTH EVERY DAY. DISCONTINUE SIMVASTATIN    Radiology:  No results found.  Cardiac Studies:   Coronary angiogram 12/05/2018: Left main diffusely diseased and occluded proximal LAD and circumflex pulmonary artery. RCA occluded in the proximal segment. SVG to RCA is patent, diffusely diseased PL branch with high-grade stenosis of 90 to 99% and PDA has about a 60 to 70% mid stenosis. Small vessels. LAD is diffusely diseased. It is occluded in the proximal segment. Supplied by LIMA to LAD. Distal to the insertion, there is a 60 to 70% diffuse disease. Small vessel distally. Free radial graft to OM1 is widely patent. Distal circumflex has a high-grade lesion, small vessel. SVG to D1 is occluded. Normal LV systolic function, mild posterior hypokinesis. Normal LVEDP. EF estimated at 55%.  PCV ECHOCARDIOGRAM COMPLETE 11/18/2020 Mildly depressed LV systolic function with visual EF 45-50%. Hypokinesis of the anterior and anteroseptal segments. Left ventricle cavity is normal in size. Normal global wall motion. Doppler evidence of grade I (impaired) diastolic dysfunction, normal LAP. No significant valvular heart disease. Compared to prior study dated 08/19/2018: LVEF was 50-55% with abnormal septal motion due to LBBB.   PCV MYOCARDIAL PERFUSION WITH LEXISCAN 11/24/2020 Lexiscan nuclear stress test performed using 1-day protocol. Rest and stress EKG showed sinus rhythm, IVCD/tachycardia. Normal myocardial perfusion. Stress LVEF 25%. High risk study due to low stress LVEF.   EKG:   EKG 11/12/2020: Sinus rhythm at a rate of 70 bpm, left atrial enlargement.  Left bundle branch block, no further analysis.  Compared to EKG 11/13/2019, no significant change.  Assessment:     ICD-10-CM   1. Coronary artery disease involving native coronary artery of native heart with other form of angina pectoris ([place])  I25.118   2. Essential hypertension  I10      No orders of the defined types were placed in this encounter.  Medications Discontinued During This Encounter  Medication Reason  . NOVOLOG 100 UNIT/ML injection Patient has not taken in last 30 days     Recommendations:   Jesus Young  is a 75 y.o. male  with known coronary artery disease s/p CABG in 2012, left bundle branch block that was noted 2014, hypertension, diabetes, hyperlipidemia and CKD stage 3.  History of COVID-19 infection in March 2022.  Patient underwent echocardiogram and nuclear stress testing January 2022.Echocardiogram revealed LVEF of 45-50% with abnormal wall motion likely secondary to left bundle branch block.  Notably LVEF is slightly reduced compared to previous 50-55%.  Nuclear stress test revealed normal myocardial perfusion and EKG without ischemic changes.  Stress test did reveal stress LVEF of 25%, which I discussed with Dr. Einar Gip who advised this is likely an error secondary to known left bundle branch block.  In view of relatively low risk nuclear stress test and stable echocardiogram, repeat coronary angiography is not indicated at this time but rather recommend medical management of symptoms.  Patient presents for 6-week follow-up of coronary artery disease and anginal symptoms.  At last visit increased amlodipine from 5 to 10 mg daily for antianginal benefit.  Previous nuclear stress test revealed LVEF of 25% which upon discussion with Dr. Einar Gip he advised this was likely an error  secondary to known left bundle branch block.  Recommended medical management of patient's angina.   Patient has had no recurrence of chest discomfort or left arm pain since last visit with increased dose of amlodipine to 10 mg daily, which she is tolerating well.  Patient does note that his activity has been reduced since recent COVID-19 infection earlier this month.  Patient's blood pressure is well controlled.  Not make changes to patient's medications at this time.   Although if patient has recurrence of anginal symptoms, could consider increasing Ranexa dose.  Patient will notify our office if he experiences worsening symptoms.  Patient is otherwise stable from a cardiovascular standpoint.  Follow-up in 6 months, sooner if needed, for CAD.   Alethia Berthold, PA-C 02/04/2021, 2:20 PM Office: [phone]

## 2021-02-04 ENCOUNTER — Ambulatory Visit: Payer: Medicare Other | Admitting: Student

## 2021-02-04 ENCOUNTER — Other Ambulatory Visit: Payer: Self-pay

## 2021-02-04 ENCOUNTER — Encounter: Payer: Self-pay | Admitting: Student

## 2021-02-04 VITALS — BP 128/66 | HR 69 | Temp 97.9°F | Ht 76.0 in | Wt 197.0 lb

## 2021-02-04 DIAGNOSIS — I10 Essential (primary) hypertension: Secondary | ICD-10-CM

## 2021-02-04 DIAGNOSIS — I25118 Atherosclerotic heart disease of native coronary artery with other forms of angina pectoris: Secondary | ICD-10-CM | POA: Diagnosis not present

## 2021-02-10 ENCOUNTER — Encounter: Payer: Self-pay | Admitting: Podiatry

## 2021-02-10 ENCOUNTER — Other Ambulatory Visit: Payer: Self-pay

## 2021-02-10 ENCOUNTER — Ambulatory Visit: Payer: Medicare Other | Admitting: Podiatry

## 2021-02-10 DIAGNOSIS — M79676 Pain in unspecified toe(s): Secondary | ICD-10-CM | POA: Diagnosis not present

## 2021-02-10 DIAGNOSIS — E119 Type 2 diabetes mellitus without complications: Secondary | ICD-10-CM | POA: Diagnosis not present

## 2021-02-10 DIAGNOSIS — D2371 Other benign neoplasm of skin of right lower limb, including hip: Secondary | ICD-10-CM | POA: Diagnosis not present

## 2021-02-10 DIAGNOSIS — E1121 Type 2 diabetes mellitus with diabetic nephropathy: Secondary | ICD-10-CM | POA: Diagnosis not present

## 2021-02-10 DIAGNOSIS — B351 Tinea unguium: Secondary | ICD-10-CM

## 2021-02-10 DIAGNOSIS — D689 Coagulation defect, unspecified: Secondary | ICD-10-CM

## 2021-02-10 DIAGNOSIS — D2372 Other benign neoplasm of skin of left lower limb, including hip: Secondary | ICD-10-CM

## 2021-02-10 DIAGNOSIS — E1165 Type 2 diabetes mellitus with hyperglycemia: Secondary | ICD-10-CM | POA: Diagnosis not present

## 2021-02-10 NOTE — Progress Notes (Signed)
He presents today chief complaint of painful toenails and calluses bilateral.  Objective: Toenails are long thick yellow dystrophic-like mycotic minimal reactive hyperkeratosis at this time.  Pulses remain palpable.  Assessment: Pain limb secondary to onychomycosis history of diabetes without complications.  Plan: Debridement of toenails and calluses bilateral.

## 2021-02-14 ENCOUNTER — Other Ambulatory Visit: Payer: Self-pay | Admitting: Cardiology

## 2021-02-17 DIAGNOSIS — E119 Type 2 diabetes mellitus without complications: Secondary | ICD-10-CM | POA: Diagnosis not present

## 2021-02-17 DIAGNOSIS — H2513 Age-related nuclear cataract, bilateral: Secondary | ICD-10-CM | POA: Diagnosis not present

## 2021-02-25 DIAGNOSIS — E875 Hyperkalemia: Secondary | ICD-10-CM | POA: Diagnosis not present

## 2021-02-28 ENCOUNTER — Other Ambulatory Visit: Payer: Self-pay | Admitting: Student

## 2021-02-28 DIAGNOSIS — I25118 Atherosclerotic heart disease of native coronary artery with other forms of angina pectoris: Secondary | ICD-10-CM

## 2021-03-04 DIAGNOSIS — E1165 Type 2 diabetes mellitus with hyperglycemia: Secondary | ICD-10-CM | POA: Diagnosis not present

## 2021-03-04 DIAGNOSIS — I25118 Atherosclerotic heart disease of native coronary artery with other forms of angina pectoris: Secondary | ICD-10-CM | POA: Diagnosis not present

## 2021-03-04 DIAGNOSIS — N1832 Chronic kidney disease, stage 3b: Secondary | ICD-10-CM | POA: Diagnosis not present

## 2021-03-04 DIAGNOSIS — M25552 Pain in left hip: Secondary | ICD-10-CM | POA: Diagnosis not present

## 2021-03-04 DIAGNOSIS — E782 Mixed hyperlipidemia: Secondary | ICD-10-CM | POA: Diagnosis not present

## 2021-03-04 DIAGNOSIS — J432 Centrilobular emphysema: Secondary | ICD-10-CM | POA: Diagnosis not present

## 2021-03-04 DIAGNOSIS — I2510 Atherosclerotic heart disease of native coronary artery without angina pectoris: Secondary | ICD-10-CM | POA: Diagnosis not present

## 2021-03-04 DIAGNOSIS — M25551 Pain in right hip: Secondary | ICD-10-CM | POA: Diagnosis not present

## 2021-03-04 DIAGNOSIS — I129 Hypertensive chronic kidney disease with stage 1 through stage 4 chronic kidney disease, or unspecified chronic kidney disease: Secondary | ICD-10-CM | POA: Diagnosis not present

## 2021-03-04 DIAGNOSIS — E039 Hypothyroidism, unspecified: Secondary | ICD-10-CM | POA: Diagnosis not present

## 2021-03-04 DIAGNOSIS — I700 Atherosclerosis of aorta: Secondary | ICD-10-CM | POA: Diagnosis not present

## 2021-03-04 DIAGNOSIS — M25559 Pain in unspecified hip: Secondary | ICD-10-CM | POA: Diagnosis not present

## 2021-03-06 DIAGNOSIS — H2513 Age-related nuclear cataract, bilateral: Secondary | ICD-10-CM | POA: Diagnosis not present

## 2021-03-23 DIAGNOSIS — E1165 Type 2 diabetes mellitus with hyperglycemia: Secondary | ICD-10-CM | POA: Diagnosis not present

## 2021-03-25 DIAGNOSIS — H2512 Age-related nuclear cataract, left eye: Secondary | ICD-10-CM | POA: Diagnosis not present

## 2021-04-04 ENCOUNTER — Other Ambulatory Visit: Payer: Self-pay | Admitting: Student

## 2021-04-04 DIAGNOSIS — I25118 Atherosclerotic heart disease of native coronary artery with other forms of angina pectoris: Secondary | ICD-10-CM

## 2021-04-22 DIAGNOSIS — H2511 Age-related nuclear cataract, right eye: Secondary | ICD-10-CM | POA: Diagnosis not present

## 2021-04-22 DIAGNOSIS — H25811 Combined forms of age-related cataract, right eye: Secondary | ICD-10-CM | POA: Diagnosis not present

## 2021-05-05 ENCOUNTER — Other Ambulatory Visit: Payer: Self-pay | Admitting: Student

## 2021-05-05 DIAGNOSIS — I25118 Atherosclerotic heart disease of native coronary artery with other forms of angina pectoris: Secondary | ICD-10-CM

## 2021-05-14 ENCOUNTER — Other Ambulatory Visit: Payer: Self-pay

## 2021-05-14 ENCOUNTER — Ambulatory Visit: Payer: Medicare Other | Admitting: Podiatry

## 2021-05-14 DIAGNOSIS — B351 Tinea unguium: Secondary | ICD-10-CM

## 2021-05-14 DIAGNOSIS — M79676 Pain in unspecified toe(s): Secondary | ICD-10-CM | POA: Diagnosis not present

## 2021-05-14 DIAGNOSIS — E119 Type 2 diabetes mellitus without complications: Secondary | ICD-10-CM

## 2021-05-14 DIAGNOSIS — D689 Coagulation defect, unspecified: Secondary | ICD-10-CM

## 2021-05-14 NOTE — Progress Notes (Signed)
He presents today chief complaint of painfully elongated toenails and calluses plantar aspect of bilateral foot.  Objective: Toenails are long thick yellow dystrophic clinical mycotic there is a small subungual abscess to the hallux right.  All of this was debrided today and drained.  He also has benign skin lesions plantar aspect of the forefoot consistent with poor keratomas.  Assessment: Pain limb secondary to onychomycosis and porokeratosis.  Plan: Debrided benign skin lesions bilateral debrided toenails 1 through 5 bilateral.

## 2021-06-05 ENCOUNTER — Other Ambulatory Visit: Payer: Self-pay | Admitting: Student

## 2021-06-05 DIAGNOSIS — I25118 Atherosclerotic heart disease of native coronary artery with other forms of angina pectoris: Secondary | ICD-10-CM

## 2021-06-25 ENCOUNTER — Other Ambulatory Visit: Payer: Self-pay

## 2021-06-25 ENCOUNTER — Ambulatory Visit: Payer: Medicare Other | Admitting: Pulmonary Disease

## 2021-06-25 ENCOUNTER — Encounter: Payer: Self-pay | Admitting: Pulmonary Disease

## 2021-06-25 VITALS — BP 146/90 | HR 74 | Temp 97.5°F | Ht 74.5 in | Wt 199.6 lb

## 2021-06-25 DIAGNOSIS — R053 Chronic cough: Secondary | ICD-10-CM | POA: Diagnosis not present

## 2021-06-25 DIAGNOSIS — R0602 Shortness of breath: Secondary | ICD-10-CM

## 2021-06-25 DIAGNOSIS — U099 Post covid-19 condition, unspecified: Secondary | ICD-10-CM | POA: Diagnosis not present

## 2021-06-25 MED ORDER — BREZTRI AEROSPHERE 160-9-4.8 MCG/ACT IN AERO: 2.0000 | INHALATION_SPRAY | Freq: Two times a day (BID) | RESPIRATORY_TRACT | 0 refills | Status: DC

## 2021-06-25 NOTE — Patient Instructions (Addendum)
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 If symptoms persistent, will pursue repeat CT and/or PFT to determine if he has had progressive changes related to his COVID-19 infection  Follow-up with me in 3 months

## 2021-06-25 NOTE — Progress Notes (Signed)
Subjective:   [name] GENDER: male DOB: 1946/03/07, MRN: [id number]   HPI  Chief Complaint  Patient presents with   Follow-up    Reason for Visit: Follow-up for cough  [name] is a 75 year old male never smoker with CAD s/p CABG, DM2, HTN, allergic rhinitis who presents for chronic cough.  Synopsis: He had been seen by his PCP regarding chronic cough for >5 years. On benadryl in the last 2 months which seems to help decrease in frequency. He still has nasal congestion and runny nose. Not currently on nasal spray. Currently on famotidine and protonix daily though he does have occasional bloating and gas feeling. In the mornings he reports vague chest fullness/pressure/tightness. In the last three months, when he coughs in the morning, he will bring up possibly 1/3 of dixie cup of sputum.   12/29/20 Since our last visit, he has been taking benadryl 25 mg twice a day. Cough with minimal sputum production. Though improved cough does seem to occur when eating. On prior telephone call he reports that he will sometimes choke on food and need help from his wife to help. He has slowed down his eating to compensate. He does still have nasal congestion as well even with benadryl use. He completed PFTs for this visit and felt some difficulty with the test due to narrowing his airflow into the mouthpiece.  06/25/21 Since our our last visit, he tested positive for COVID-19 in March 2022. He reports increased shortness of breath that began in the last few months. Also having intermittent arm pain. He has a dry hacking cough that began two weeks ago. Compliant with montelukast. Not on any inhalers. He reports that he will sometimes wheeze when waking up or when laying down. He reports  Review of Systems  Constitutional:  Negative for chills, diaphoresis, fever, malaise/fatigue and weight loss.  HENT:  Negative for congestion.   Respiratory:  Positive for cough,  shortness of breath and wheezing. Negative for hemoptysis and sputum production.   Cardiovascular:  Negative for chest pain, palpitations and leg swelling.    Social History: Never smoker  Second hand smoke Insurance underwriter  Past Medical History:  Diagnosis Date   Anxiety    Arthritis    back and knees   Bronchitis    hx of 2015   Cancer ([place])    prostate   Cataract    immature on right   Chronic back pain    CKD (chronic kidney disease)    Constipation    takes Colace at bedtime   Coronary artery disease    takes Plavix daily but is on hold for surgery   Depression    Diabetes mellitus without complication (HCC)    takes Levemir and Humalog daily   Dizziness    Fall    hx of with injury to left shoulder    GERD (gastroesophageal reflux disease)    takes Protonix daily and Zantac   H/O urinary frequency    Hemorrhoids    History of bladder infections    History of kidney stones    Hyperlipidemia    takes Zocor every evening   Hypertension    takes Metoprolol daily   Hypothyroidism    takes Synthroid daily   Left bundle branch block    Myocardial infarction ([place])    many yrs before 2002   Night muscle spasms    takes Robaxin 4 x day  Nocturia      Allergies  Allergen Reactions   Clonazepam     Headache    Lisinopril     headache     Outpatient Medications Prior to Visit  Medication Sig Dispense Refill   acetaminophen (TYLENOL) 650 MG CR tablet Take 1,300 mg by mouth every 8 (eight) hours as needed for pain. PRN     amLODipine (NORVASC) 5 MG tablet Take 2 tablets (10 mg total) by mouth daily. 180 tablet 2   aspirin EC 81 MG tablet Take 81 mg by mouth at bedtime.      B-D ULTRAFINE III SHORT PEN 31G X 8 MM MISC USE UTD  0   busPIRone (BUSPAR) 15 MG tablet Take 15 mg by mouth 2 (two) times daily.     clopidogrel (PLAVIX) 75 MG tablet Take 75 mg by mouth at bedtime.     docusate sodium (COLACE) 100 MG capsule Take 400 mg by mouth at  bedtime.     famotidine (PEPCID) 40 MG tablet Take 40 mg by mouth daily.     fluticasone (FLONASE) 50 MCG/ACT nasal spray Place 1 spray into both nostrils daily.     insulin detemir (LEVEMIR) 100 UNIT/ML injection Inject 0.16 mLs (16 Units total) into the skin 2 (two) times daily. (Patient taking differently: Inject 18 Units into the skin 2 (two) times daily. 18 units in am, 22units at hs) 10 mL 1   insulin lispro (HUMALOG) 100 UNIT/ML injection Inject 0.05-0.09 mLs (5-9 Units total) into the skin 2 (two) times daily. 5 units at lunch, and 9 units after dinner. (Patient taking differently: Inject 5-15 Units into the skin 2 (two) times daily. 5-15 units as needed  (sliding scale insulin)) 10 mL 1   isosorbide mononitrate (IMDUR) 120 MG 24 hr tablet TAKE 1 TABLET(120 MG) BY MOUTH DAILY 90 tablet 3   levothyroxine (SYNTHROID, LEVOTHROID) 100 MCG tablet Take 100 mcg by mouth See admin instructions. Take 1 tablet (100 mcg) by mouth daily in the morning, except on Sundays     metoprolol (LOPRESSOR) 50 MG tablet Take 50 mg by mouth 2 (two) times daily.      montelukast (SINGULAIR) 10 MG tablet Take 1 tablet (10 mg total) by mouth at bedtime. 30 tablet 11   nitroGLYCERIN (NITROSTAT) 0.4 MG SL tablet Place 1 tablet (0.4 mg total) under the tongue every 5 (five) minutes as needed for chest pain. 15 tablet 3   ONE TOUCH ULTRA TEST test strip      pantoprazole (PROTONIX) 40 MG tablet Take 40 mg by mouth at bedtime.      ranolazine (RANEXA) 500 MG 12 hr tablet Take 2 tablets by mouth twice daily 120 tablet 0   rosuvastatin (CRESTOR) 20 MG tablet TAKE 1 TABLET BY MOUTH EVERY DAY. DISCONTINUE SIMVASTATIN 90 tablet 3   busPIRone (BUSPAR) 10 MG tablet Take 20 mg by mouth 2 (two) times daily. (Patient not taking: Reported on 06/25/2021)     diphenhydrAMINE (BENADRYL ALLERGY) 25 mg capsule Take 1 capsule (25 mg total) by mouth every 6 (six) hours as needed. (Patient not taking: Reported on 06/25/2021) 90 capsule 6    ketorolac (ACULAR) 0.5 % ophthalmic solution Place 1 drop into the left eye 4 (four) times daily. (Patient not taking: Reported on 06/25/2021)     moxifloxacin (VIGAMOX) 0.5 % ophthalmic solution Place 1 drop into the left eye 4 (four) times daily. (Patient not taking: Reported on 06/25/2021)     prednisoLONE acetate (PRED  FORTE) 1 % ophthalmic suspension Place 1 drop into the left eye 4 (four) times daily. (Patient not taking: Reported on 06/25/2021)     No facility-administered medications prior to visit.    Objective:   Vitals:   06/25/21 0908  BP: (!) 146/90  Pulse: 74  Temp: (!) 97.5 F (36.4 C)  TempSrc: Oral  SpO2: 97%  Weight: 199 lb 9.6 oz (90.5 kg)  Height: 6' 2.5" (1.892 m)      Physical Exam: General: Well-appearing, no acute distress HENT: [place], AT Eyes: EOMI, no scleral icterus Respiratory: Clear to auscultation bilaterally.  No crackles, wheezing or rales Cardiovascular: RRR, -M/R/G, no JVD Extremities:-Edema,-tenderness Neuro: AAO x4, CNII-XII grossly intact Psych: Normal mood, normal affect  Data Reviewed:  Imaging: CXR 10/30/15 S/p CABG. No infiltrate, effusion or edema CT Chest 01/09/21 - S/p CABG changes. Mild biapical pleuroparenchymal scarring. Scattered calcified granulomas. Subsegmental atelectasis in the lung bases.  PFT: 12/29/20 FVC 3.63 (68%) FEV1 2.88 (73%) Ratio 76  TLC 75% DLCO 70% Interpretation: Mild restrictive defect with mildly reduced DLCO   Labs: Absolute Eos 10/30/15 200    Assessment & Plan:   Discussion: 75 year old male never smoker with CAD s/p CABG, DM2, HTN and allergic rhinitis who presents for follow-up.  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 If symptoms persistent, will pursue repeat CT and/or PFT to determine if he has had progressive changes related to his COVID-19 infection  Allergic Rhinitis --CONTINUE benadryl 25 mg twice daily --CONTINUE flonase (generic: fluticasone). One spray per nare nightly  Health Maintenance Immunization History  Administered Date(s) Administered   DTaP 11/25/1994   Influenza, High Dose Seasonal PF 10/02/2014, 08/01/2015, 07/20/2016   Influenza, Quadrivalent, Recombinant, Inj, Pf 07/26/2017, 07/17/2019   Influenza,inj,Quad PF,6-35 Mos 01/23/2020   Influenza-Unspecified 10/11/2011, 12/04/2012, 09/24/2013, 08/15/2018   PFIZER Comirnaty(Gray Top)Covid-19 Tri-Sucrose Vaccine 04/21/2021   PFIZER(Purple Top)SARS-COV-2 Vaccination 01/02/2020, 01/23/2020, 08/11/2020   Pneumococcal Conjugate-13 05/24/2013   Pneumococcal Polysaccharide-23 06/12/2019   Tdap 05/24/2013   CT Lung Screen - not indicated  No orders of the defined types were placed in this encounter. Meds ordered this encounter  Medications   Budeson-Glycopyrrol-Formoterol (BREZTRI AEROSPHERE) 160-9-4.8 MCG/ACT AERO    Sig: Inhale 2 puffs into the lungs in the morning and at bedtime.    Dispense:  1 g    Refill:  0    Order Specific Question:   Lot Number?    Answer:   [id number]    Order Specific Question:   Expiration Date?    Answer:   09/15/2023    Order Specific Question:   Manufacturer?    Answer:   [employer] [[id number]]    Order Specific Question:   Quantity    Answer:   1    No follow-ups on file.  I have spent a total time of 35-minutes on the day of the appointment reviewing prior documentation, coordinating care and discussing medical diagnosis and plan with the patient/family. Past medical history, allergies, medications were reviewed. Pertinent imaging, labs and tests included in this note have been reviewed and interpreted independently by me.   [place], MD [facility] 06/25/2021 9:09 AM  Office Number [phone]

## 2021-06-25 NOTE — Progress Notes (Signed)
Patient seen in the office today and instructed on use of Breztri.  Patient expressed understanding and demonstrated technique.  

## 2021-06-29 ENCOUNTER — Ambulatory Visit: Payer: Medicare Other | Admitting: Student

## 2021-06-29 ENCOUNTER — Other Ambulatory Visit: Payer: Self-pay

## 2021-06-29 ENCOUNTER — Encounter: Payer: Self-pay | Admitting: Student

## 2021-06-29 VITALS — BP 122/69 | HR 65 | Temp 97.8°F | Resp 16 | Ht 74.5 in | Wt 197.4 lb

## 2021-06-29 DIAGNOSIS — R0609 Other forms of dyspnea: Secondary | ICD-10-CM

## 2021-06-29 DIAGNOSIS — R0601 Orthopnea: Secondary | ICD-10-CM

## 2021-06-29 DIAGNOSIS — R0600 Dyspnea, unspecified: Secondary | ICD-10-CM

## 2021-06-29 DIAGNOSIS — I25118 Atherosclerotic heart disease of native coronary artery with other forms of angina pectoris: Secondary | ICD-10-CM

## 2021-06-29 MED ORDER — FUROSEMIDE 40 MG PO TABS: 40.0000 mg | ORAL_TABLET | Freq: Every day | ORAL | 3 refills | Status: DC

## 2021-06-29 NOTE — Progress Notes (Signed)
Primary Physician:  Merrilee Seashore, MD   [name], male    DOB: Sep 16, 1946, 75 y.o.   MRN: [id number]  Subjective:    Chief Complaint  Patient presents with   Coronary Artery Disease   Chest Pain   Follow-up    6 months    HPI: Jesus Young  is a 75 y.o. male  with known coronary artery disease s/p CABG in 2012,  left bundle branch block that was noted  2014, hypertension, diabetes, hyperlipidemia and chronic kidney disease. History of Covid 19 infection during March 2022.   Due to complaints of left arm pain with exertion, underwent lexiscan nuclear stress test on 08/07/2018 considered high risk study due to LVEF; however, on echocardiogram had normal LVEF.  No significant changes were noted compared to the 2014, but in view of continued symptoms despite medical management, he underwent coronary angiogram on 12/05/2018 and patient was noted to have small vessel disease and microvascular disease.  Patient presents for urgent visit at his request.  At last office visit patient's antianginal therapy had been uptitrated and he was doing well.  However patient states that over the last 3 to 4 weeks he has been experiencing increased episodes of intermittent left arm and left-sided chest pain, he has been taking nitroglycerin regularly over the last few weeks.  He also continues to suffer with dyspnea on exertion since his COVID 19 infection.  He continues to follow with pulmonology who has recommended starting him on inhaler therapies.  Patient states he has noticed mild worsening bilateral leg edema as well as orthopnea for the last 2 weeks.  Past Medical History:  Diagnosis Date   Anxiety    Arthritis    back and knees   Bronchitis    hx of 2015   Cancer ([place])    prostate   Cataract    immature on right   Chronic back pain    CKD (chronic kidney disease)    Constipation    takes Colace at bedtime   Coronary artery disease    takes Plavix daily but  is on hold for surgery   Depression    Diabetes mellitus without complication (HCC)    takes Levemir and Humalog daily   Dizziness    Fall    hx of with injury to left shoulder    GERD (gastroesophageal reflux disease)    takes Protonix daily and Zantac   H/O urinary frequency    Hemorrhoids    History of bladder infections    History of kidney stones    Hyperlipidemia    takes Zocor every evening   Hypertension    takes Metoprolol daily   Hypothyroidism    takes Synthroid daily   Left bundle branch block    Myocardial infarction [facility])    many yrs before 2002   Night muscle spasms    takes Robaxin 4 x day    Nocturia     Past Surgical History:  Procedure Laterality Date   CARDIAC CATHETERIZATION  05/17/02   COLONOSCOPY     CORONARY ANGIOPLASTY     x 1    CORONARY ARTERY BYPASS GRAFT  2002   x 5   eyelid surgery      INGUINAL HERNIA REPAIR Left 05/06/2020   Procedure: LEFT INGUINAL HERNIA REPAIR;  Surgeon: Donnie Mesa, MD;  Location: [facility];  Service: General;  Laterality: Left;   INSERTION OF MESH Left 05/06/2020   Procedure: INSERTION  OF MESH;  Surgeon: Donnie Mesa, MD;  Location: [place];  Service: General;  Laterality: Left;   LEFT HEART CATH AND CORS/GRAFTS ANGIOGRAPHY N/A 12/05/2018   Procedure: LEFT HEART CATH AND CORS/GRAFTS ANGIOGRAPHY;  Surgeon: Adrian Prows, MD;  Location: [place] [facility];  Service: Cardiovascular;  Laterality: N/A;   LUMBAR LAMINECTOMY/DECOMPRESSION MICRODISCECTOMY Bilateral 07/18/2013   Procedure: Bilateral Lumbar four-five Lumbar Laminotomy/Microdiskectomy;  Surgeon: Hosie Spangle, MD;  Location: [place] [facility];  Service: Neurosurgery;  Laterality: Bilateral;  Bilateral Lumbar four-five Lumbar Laminotomy/Microdiskectomy   NEPHROLITHOTOMY Left 06/23/2015   Procedure: NEPHROLITHOTOMY PERCUTANEOUS;  Surgeon: [name], MD;  Location: [facility];  Service: Urology;  Laterality: Left;   PROSTATECTOMY  10/2009   skin spots removed from back  -precancerous     TONSILLECTOMY     Family History  Problem Relation Age of Onset   Heart disease Father    Heart attack Father    Thyroid disease Sister    Social History   Tobacco Use   Smoking status: Never   Smokeless tobacco: Never  Substance Use Topics   Alcohol use: No  Martial status: Married  ROS   Review of Systems  Constitutional: Negative for malaise/fatigue and weight gain.  Cardiovascular:  Positive for chest pain and dyspnea on exertion. Negative for claudication, leg swelling, near-syncope, orthopnea, palpitations, paroxysmal nocturnal dyspnea and syncope.  Neurological:  Negative for dizziness.     Objective:  Blood pressure 122/69, pulse 65, temperature 97.8 F (36.6 C), temperature source Temporal, resp. rate 16, height 6' 2.5" (1.892 m), weight 197 lb 6.4 oz (89.5 kg). Body mass index is 25.01 kg/m.     Physical Exam Vitals reviewed.  Constitutional:      Appearance: He is well-developed.  Cardiovascular:     Rate and Rhythm: Normal rate and regular rhythm.     Pulses: Intact distal pulses.          Carotid pulses are 2+ on the right side and 2+ on the left side.      Radial pulses are 2+ on the right side and 2+ on the left side.       Femoral pulses are 2+ on the right side and 2+ on the left side.      Popliteal pulses are 2+ on the right side and 2+ on the left side.       Dorsalis pedis pulses are 2+ on the right side and 2+ on the left side.       Posterior tibial pulses are 2+ on the right side and 2+ on the left side.     Heart sounds: Normal heart sounds, S1 normal and S2 normal. No murmur heard.   No gallop.  Pulmonary:     Effort: Pulmonary effort is normal. No accessory muscle usage or respiratory distress.     Breath sounds: Normal breath sounds. No wheezing, rhonchi or rales.  Musculoskeletal:        General: Normal range of motion.     Cervical back: Normal range of motion.     Right lower leg: Edema (1+ pitting at mid-shin)  present.     Left lower leg: Edema (1+ pitting at mid-shin) present.  Skin:    General: Skin is warm and dry.  Neurological:     General: No focal deficit present.     Mental Status: He is alert and oriented to person, place, and time.   Laboratory examination:    CMP Latest Ref Rng & Units  04/30/2020 10/22/2017 11/21/2016  Glucose 70 - 99 mg/dL 143(H) 119(H) 226(H)  BUN 8 - 23 mg/dL 23 21(H) 36(H)  Creatinine 0.61 - 1.24 mg/dL 1.76(H) 1.42(H) 1.57(H)  Sodium 135 - 145 mmol/L 136 140 137  Potassium 3.5 - 5.1 mmol/L 4.6 4.0 5.1  Chloride 98 - 111 mmol/L 106 106 102  CO2 22 - 32 mmol/L 22 25 24   Calcium 8.9 - 10.3 mg/dL 9.1 9.2 8.8(L)  Total Protein 6.5 - 8.1 g/dL - - 6.6  Total Bilirubin 0.3 - 1.2 mg/dL - - 0.9  Alkaline Phos 38 - 126 U/L - - 46  AST 15 - 41 U/L - - 37  ALT 17 - 63 U/L - - 47   CBC Latest Ref Rng & Units 04/30/2020 12/06/2018 12/05/2018  WBC 4.0 - 10.5 K/uL 6.0 - -  Hemoglobin 13.0 - 17.0 g/dL 16.0 14.2 14.3  Hematocrit 39.0 - 52.0 % 51.0 43.7 44.9  Platelets 150 - 400 K/uL 181 - -   Lipid Panel  No results found for: CHOL, TRIG, HDL, CHOLHDL, VLDL, LDLCALC, LDLDIRECT HEMOGLOBIN A1C Lab Results  Component Value Date   HGBA1C 6.9 (H) 04/30/2020   MPG 151 04/30/2020   TSH No results for input(s): TSH in the last 8760 hours.  External Labs:   10/21/2020: HDL 33, LDL 70, total cholesterol 132, triglycerides 167 TSH 1.62 BUN 28  04/30/2020: A1c 6.9%, serum creatinine 1.76  Allergies   Allergies  Allergen Reactions   Clonazepam     Headache    Lisinopril     headache      Medications Prior to Visit:   Outpatient Medications Prior to Visit  Medication Sig Dispense Refill   acetaminophen (TYLENOL) 650 MG CR tablet Take 1,300 mg by mouth every 8 (eight) hours as needed for pain. PRN     amLODipine (NORVASC) 5 MG tablet Take 2 tablets (10 mg total) by mouth daily. 180 tablet 2   aspirin EC 81 MG tablet Take 81 mg by mouth at bedtime.      B-D  ULTRAFINE III SHORT PEN 31G X 8 MM MISC USE UTD  0   Budeson-Glycopyrrol-Formoterol (BREZTRI AEROSPHERE) 160-9-4.8 MCG/ACT AERO Inhale 2 puffs into the lungs in the morning and at bedtime. 1 g 0   busPIRone (BUSPAR) 15 MG tablet Take 30 mg by mouth 2 (two) times daily.     clopidogrel (PLAVIX) 75 MG tablet Take 75 mg by mouth at bedtime.     docusate sodium (COLACE) 100 MG capsule Take 400 mg by mouth at bedtime.     famotidine (PEPCID) 40 MG tablet Take 40 mg by mouth daily.     fluticasone (FLONASE) 50 MCG/ACT nasal spray Place 1 spray into both nostrils daily.     insulin detemir (LEVEMIR) 100 UNIT/ML injection Inject 0.16 mLs (16 Units total) into the skin 2 (two) times daily. (Patient taking differently: Inject 18 Units into the skin 2 (two) times daily. 18 units in am, 22units at hs) 10 mL 1   insulin lispro (HUMALOG) 100 UNIT/ML injection Inject 0.05-0.09 mLs (5-9 Units total) into the skin 2 (two) times daily. 5 units at lunch, and 9 units after dinner. (Patient taking differently: Inject 5-15 Units into the skin 2 (two) times daily. 5-15 units as needed  (sliding scale insulin)) 10 mL 1   isosorbide mononitrate (IMDUR) 120 MG 24 hr tablet TAKE 1 TABLET(120 MG) BY MOUTH DAILY 90 tablet 3   levothyroxine (SYNTHROID, LEVOTHROID) 100 MCG tablet Take  100 mcg by mouth See admin instructions. Take 1 tablet (100 mcg) by mouth daily in the morning, except on Sundays     metoprolol (LOPRESSOR) 50 MG tablet Take 50 mg by mouth 2 (two) times daily.      montelukast (SINGULAIR) 10 MG tablet Take 1 tablet (10 mg total) by mouth at bedtime. 30 tablet 11   nitroGLYCERIN (NITROSTAT) 0.4 MG SL tablet Place 1 tablet (0.4 mg total) under the tongue every 5 (five) minutes as needed for chest pain. 15 tablet 3   ONE TOUCH ULTRA TEST test strip      pantoprazole (PROTONIX) 40 MG tablet Take 40 mg by mouth at bedtime.      ranolazine (RANEXA) 500 MG 12 hr tablet Take 2 tablets by mouth twice daily 120 tablet 0    rosuvastatin (CRESTOR) 20 MG tablet TAKE 1 TABLET BY MOUTH EVERY DAY. DISCONTINUE SIMVASTATIN 90 tablet 3   busPIRone (BUSPAR) 10 MG tablet Take 20 mg by mouth 2 (two) times daily.     No facility-administered medications prior to visit.     Final Medications at End of Visit    Current Meds  Medication Sig   acetaminophen (TYLENOL) 650 MG CR tablet Take 1,300 mg by mouth every 8 (eight) hours as needed for pain. PRN   amLODipine (NORVASC) 5 MG tablet Take 2 tablets (10 mg total) by mouth daily.   aspirin EC 81 MG tablet Take 81 mg by mouth at bedtime.    B-D ULTRAFINE III SHORT PEN 31G X 8 MM MISC USE UTD   Budeson-Glycopyrrol-Formoterol (BREZTRI AEROSPHERE) 160-9-4.8 MCG/ACT AERO Inhale 2 puffs into the lungs in the morning and at bedtime.   busPIRone (BUSPAR) 15 MG tablet Take 30 mg by mouth 2 (two) times daily.   clopidogrel (PLAVIX) 75 MG tablet Take 75 mg by mouth at bedtime.   docusate sodium (COLACE) 100 MG capsule Take 400 mg by mouth at bedtime.   famotidine (PEPCID) 40 MG tablet Take 40 mg by mouth daily.   fluticasone (FLONASE) 50 MCG/ACT nasal spray Place 1 spray into both nostrils daily.   furosemide (LASIX) 40 MG tablet Take 1 tablet (40 mg total) by mouth daily.   insulin detemir (LEVEMIR) 100 UNIT/ML injection Inject 0.16 mLs (16 Units total) into the skin 2 (two) times daily. (Patient taking differently: Inject 18 Units into the skin 2 (two) times daily. 18 units in am, 22units at hs)   insulin lispro (HUMALOG) 100 UNIT/ML injection Inject 0.05-0.09 mLs (5-9 Units total) into the skin 2 (two) times daily. 5 units at lunch, and 9 units after dinner. (Patient taking differently: Inject 5-15 Units into the skin 2 (two) times daily. 5-15 units as needed  (sliding scale insulin))   isosorbide mononitrate (IMDUR) 120 MG 24 hr tablet TAKE 1 TABLET(120 MG) BY MOUTH DAILY   levothyroxine (SYNTHROID, LEVOTHROID) 100 MCG tablet Take 100 mcg by mouth See admin instructions. Take 1  tablet (100 mcg) by mouth daily in the morning, except on Sundays   metoprolol (LOPRESSOR) 50 MG tablet Take 50 mg by mouth 2 (two) times daily.    montelukast (SINGULAIR) 10 MG tablet Take 1 tablet (10 mg total) by mouth at bedtime.   nitroGLYCERIN (NITROSTAT) 0.4 MG SL tablet Place 1 tablet (0.4 mg total) under the tongue every 5 (five) minutes as needed for chest pain.   ONE TOUCH ULTRA TEST test strip    pantoprazole (PROTONIX) 40 MG tablet Take 40 mg by mouth at  bedtime.    ranolazine (RANEXA) 500 MG 12 hr tablet Take 2 tablets by mouth twice daily   rosuvastatin (CRESTOR) 20 MG tablet TAKE 1 TABLET BY MOUTH EVERY DAY. DISCONTINUE SIMVASTATIN    Radiology:  No results found.  Cardiac Studies:    Coronary angiogram 12/05/2018: Left main diffusely diseased and occluded proximal LAD and circumflex pulmonary artery.  RCA occluded in the proximal segment.  SVG to RCA is patent, diffusely diseased PL branch with high-grade stenosis of 90 to 99% and PDA has about a 60 to 70% mid stenosis.  Small vessels. LAD is diffusely diseased.  It is occluded in the proximal segment.  Supplied by LIMA to LAD.  Distal to the insertion, there is a 60 to 70% diffuse disease.  Small vessel distally. Free radial graft to OM1 is widely patent.  Distal circumflex has a high-grade lesion, small vessel. SVG to D1 is occluded. Normal LV systolic function, mild posterior hypokinesis.  Normal LVEDP.  EF estimated at 55%.  PCV ECHOCARDIOGRAM COMPLETE 11/18/2020 Mildly depressed LV systolic function with visual EF 45-50%. Hypokinesis of the anterior and anteroseptal segments. Left ventricle cavity is normal in size. Normal global wall motion. Doppler evidence of grade I (impaired) diastolic dysfunction, normal LAP. No significant valvular heart disease. Compared to prior study dated 08/19/2018: LVEF was 50-55% with abnormal septal motion due to LBBB.   PCV MYOCARDIAL PERFUSION WITH LEXISCAN 11/24/2020 Lexiscan nuclear  stress test performed using 1-day protocol. Rest and stress EKG showed sinus rhythm, IVCD/tachycardia. Normal myocardial perfusion. Stress LVEF 25%. High risk study due to low stress LVEF. Further discuss with Dr. Einar Gip, suspects that the LVEF is an error, likely secondary to known LBBB.    EKG:  EKG 06/29/2021: Sinus rhythm at a rate of 71 bpm.  Left atrial enlargement.  Left bundle branch block, no further analysis.  Compared to EKG 11/12/2020, no significant change.  Assessment:     ICD-10-CM   1. Coronary artery disease involving native coronary artery of native heart with other form of angina pectoris ([place])  I25.118 EKG 12-Lead    2. Orthopnea  R06.01 Brain natriuretic peptide    Basic metabolic panel    Basic metabolic panel    Brain natriuretic peptide    PCV ECHOCARDIOGRAM COMPLETE    Brain natriuretic peptide    Basic metabolic panel    3. Dyspnea on exertion  R06.00       Meds ordered this encounter  Medications   furosemide (LASIX) 40 MG tablet    Sig: Take 1 tablet (40 mg total) by mouth daily.    Dispense:  30 tablet    Refill:  3   Medications Discontinued During This Encounter  Medication Reason   busPIRone (BUSPAR) 10 MG tablet Error   Recommendations:   Jesus Young  is a 75 y.o. male  with known coronary artery disease s/p CABG in 2012,  left bundle branch block that was noted  2014, hypertension, diabetes, hyperlipidemia and CKD stage 3.  History of COVID-19 infection in March 2022.  Patient underwent echocardiogram and nuclear stress testing January 2022.Echocardiogram revealed LVEF of 45-50% with abnormal wall motion likely secondary to left bundle branch block.  Notably LVEF is slightly reduced compared to previous 50-55%.  Nuclear stress test revealed normal myocardial perfusion and EKG without ischemic changes.  Stress test did reveal stress LVEF of 25%, which I discussed with Dr. Einar Gip who advised this is likely an error secondary to known left  bundle  branch block.    Patient presents for urgent visit at his request with worsening dyspnea on exertion, chest pain, and orthopnea over the last several weeks.  Patient was previously doing well with up titration of antianginal therapy and addition of amlodipine.  On exam patient appears more volume overloaded than last visit.  Will obtain BNP, BMP, and repeat echocardiogram.  Advised patient to take Lasix 40 mg daily for the next 5 days, then reduce this to as needed for volume overload.  Patient verbalized understanding agreement.  We will repeat BNP and BMP in 1 week.  Patient is presently on good antianginal medication, however if symptoms fail to improve with diuresis, could consider up titration of metoprolol versus repeat ischemic evaluation, potentially with cardiac catheterization.  However suspect patient's anginal symptoms are related to small vessel disease.  We will continue present medical management.  Follow-up in 6 weeks, sooner if needed, for orthopnea, dyspnea on exertion, CAD.    Alethia Berthold, PA-C 06/29/2021, 5:12 PM Office: [phone]

## 2021-06-30 LAB — BASIC METABOLIC PANEL
BUN/Creatinine Ratio: 13 (ref 10–24)
BUN: 21 mg/dL (ref 8–27)
CO2: 23 mmol/L (ref 20–29)
Calcium: 9.4 mg/dL (ref 8.6–10.2)
Chloride: 104 mmol/L (ref 96–106)
Creatinine, Ser: 1.66 mg/dL — ABNORMAL HIGH (ref 0.76–1.27)
Glucose: 77 mg/dL (ref 65–99)
Potassium: 4.7 mmol/L (ref 3.5–5.2)
Sodium: 141 mmol/L (ref 134–144)
eGFR: 43 mL/min/{1.73_m2} — ABNORMAL LOW (ref >59)

## 2021-06-30 LAB — BRAIN NATRIURETIC PEPTIDE: BNP: 536.1 pg/mL — ABNORMAL HIGH (ref 0.0–100.0)

## 2021-07-01 NOTE — Progress Notes (Signed)
BNP (measure of heart failure) was elevated, symptoms may be due to volume overload.  Continue fluid pill and repeat lab work.

## 2021-07-03 NOTE — Progress Notes (Signed)
Attempted to call patient, left voicemail.

## 2021-07-03 NOTE — Progress Notes (Signed)
Called and spoke with patient regarding his BNP. Patient stated that he is feeling significantly better, no more arm pain, no more wheezing. He would like a call if/when you have some time today.

## 2021-07-06 ENCOUNTER — Other Ambulatory Visit: Payer: Self-pay | Admitting: Student

## 2021-07-06 DIAGNOSIS — R0601 Orthopnea: Secondary | ICD-10-CM | POA: Diagnosis not present

## 2021-07-06 DIAGNOSIS — I25118 Atherosclerotic heart disease of native coronary artery with other forms of angina pectoris: Secondary | ICD-10-CM

## 2021-07-07 LAB — BASIC METABOLIC PANEL
BUN/Creatinine Ratio: 15 (ref 10–24)
BUN: 30 mg/dL — ABNORMAL HIGH (ref 8–27)
CO2: 24 mmol/L (ref 20–29)
Calcium: 9.6 mg/dL (ref 8.6–10.2)
Chloride: 101 mmol/L (ref 96–106)
Creatinine, Ser: 1.98 mg/dL — ABNORMAL HIGH (ref 0.76–1.27)
Glucose: 99 mg/dL (ref 65–99)
Potassium: 4.3 mmol/L (ref 3.5–5.2)
Sodium: 143 mmol/L (ref 134–144)
eGFR: 35 mL/min/{1.73_m2} — ABNORMAL LOW (ref >59)

## 2021-07-07 LAB — BRAIN NATRIURETIC PEPTIDE: BNP: 384.5 pg/mL — ABNORMAL HIGH (ref 0.0–100.0)

## 2021-07-07 NOTE — Addendum Note (Signed)
Addended by: Lawerance Cruel L on: 07/07/2021 01:09 PM   Modules accepted: Orders

## 2021-07-07 NOTE — Progress Notes (Signed)
Please inform patient BNP has improved.  However renal function has decreased some.  Confirm patient is only taking Lasix as needed instead of daily now.  We will plan to repeat lab work again in 2 weeks to recheck kidney function

## 2021-07-08 ENCOUNTER — Other Ambulatory Visit: Payer: Self-pay

## 2021-07-08 ENCOUNTER — Ambulatory Visit: Payer: Medicare Other

## 2021-07-08 DIAGNOSIS — R0601 Orthopnea: Secondary | ICD-10-CM

## 2021-07-08 NOTE — Progress Notes (Signed)
Called and spoke with patient regarding his lab results. Patient was taking Lasix daily and now has questions about why he needs to stop, or can he just drink more water to stay hydrated. He is not sure what "as needed" consists of. He would like a call back when you have a chance.

## 2021-07-09 ENCOUNTER — Telehealth: Payer: Self-pay | Admitting: Student

## 2021-07-09 DIAGNOSIS — R0600 Dyspnea, unspecified: Secondary | ICD-10-CM

## 2021-07-09 DIAGNOSIS — R0609 Other forms of dyspnea: Secondary | ICD-10-CM

## 2021-07-09 NOTE — Telephone Encounter (Signed)
Patient called requiring Lasix daily again. Given renal dysfunction with Lasix 40 mg daily advised him to take 20 mg daily with repeat labs and follow up visit in 1 week.

## 2021-07-10 NOTE — Progress Notes (Signed)
Have you spoke with patient? Please advise.

## 2021-07-13 NOTE — Progress Notes (Signed)
I have spoken with patient and advised him to cut Lasix in half and repeat labs. Patient is aware.

## 2021-07-14 ENCOUNTER — Other Ambulatory Visit: Payer: Self-pay

## 2021-07-14 ENCOUNTER — Ambulatory Visit: Payer: Medicare Other | Admitting: Podiatry

## 2021-07-14 ENCOUNTER — Encounter: Payer: Self-pay | Admitting: Podiatry

## 2021-07-14 DIAGNOSIS — M79676 Pain in unspecified toe(s): Secondary | ICD-10-CM | POA: Diagnosis not present

## 2021-07-14 DIAGNOSIS — D2371 Other benign neoplasm of skin of right lower limb, including hip: Secondary | ICD-10-CM

## 2021-07-14 DIAGNOSIS — D689 Coagulation defect, unspecified: Secondary | ICD-10-CM

## 2021-07-14 DIAGNOSIS — D2372 Other benign neoplasm of skin of left lower limb, including hip: Secondary | ICD-10-CM

## 2021-07-14 DIAGNOSIS — B351 Tinea unguium: Secondary | ICD-10-CM | POA: Diagnosis not present

## 2021-07-14 DIAGNOSIS — E119 Type 2 diabetes mellitus without complications: Secondary | ICD-10-CM | POA: Diagnosis not present

## 2021-07-14 NOTE — Progress Notes (Signed)
He presents today chief complaint of painful elongated toenails and calluses bilaterally.  Objective: Vital signs are stable he is alert orient x3 pulses are palpable neurologic sensorium is intact Deetjen reflex are intact toenails are long thick yellow dystrophic-like mycotic multiple areas of benign skin lesions plantarly.  No open lesions or wounds are noted.  Assessment: Pain in limb secondary to onychomycosis and benign skin lesions.  Plan: Debridement of benign skin lesions.  Debridement of toenails 1 through 5 bilateral.

## 2021-07-15 DIAGNOSIS — R0600 Dyspnea, unspecified: Secondary | ICD-10-CM | POA: Diagnosis not present

## 2021-07-15 NOTE — Progress Notes (Signed)
Will discuss at upcoming [place]

## 2021-07-15 NOTE — Progress Notes (Signed)
Primary Physician:  Merrilee Seashore, MD   [name], male    DOB: [birth year], 75 y.o.   MRN: [id number]  Subjective:    Chief Complaint  Patient presents with   Coronary Artery Disease   Shortness of Breath   Follow-up   Results    HPI: Jesus Young  is a 75 y.o. male  with known coronary artery disease s/p CABG in 2012,  left bundle branch block that was noted  2014, hypertension, diabetes, hyperlipidemia and chronic kidney disease. History of Covid 19 infection during March 2022.   Due to complaints of left arm pain with exertion, underwent lexiscan nuclear stress test on 08/07/2018 considered high risk study due to LVEF; however, on echocardiogram had normal LVEF.  No significant changes were noted compared to the 2014, but in view of continued symptoms despite medical management, he underwent coronary angiogram on 12/05/2018 and patient was noted to have small vessel and microvascular disease. Patient again presented with exertional symptoms, therefore underwent repeat echocardiogram and nuclear stress test 11/2020.  Stress test was high risk given low stress LVEF, however this was reviewed with Dr. Einar Gip who felt it was likely an error related to known left bundle branch block as echocardiogram at that time revealed LVEF 45-50%.  Patient presented to our office 06/29/2021 with worsening shortness of breath and orthopnea.  He was started on Lasix 40 mg daily which resolved his symptoms, however repeat BMP revealed worsening renal function.  Patient was advised to reduce Lasix to as needed, however he found himself requiring Lasix daily, and therefore advised him to reduce Lasix to 20 mg daily and again repeat BMP revealed that renal function has not recovered.  Also ordered repeat echocardiogram following last office visit.  Patient now presents for follow-up.  Repeat echocardiogram revealed LVEF has reduced further to 30-35%.  Patient states he has mild  fatigue.  However denies chest pain, orthopnea, dyspnea, leg swelling.  He is presently taking Lasix 20 mg daily.  Past Medical History:  Diagnosis Date   Anxiety    Arthritis    back and knees   Bronchitis    hx of 2015   Cancer ([place])    prostate   Cataract    immature on right   Chronic back pain    CKD (chronic kidney disease)    Constipation    takes Colace at bedtime   Coronary artery disease    takes Plavix daily but is on hold for surgery   Depression    Diabetes mellitus without complication (HCC)    takes Levemir and Humalog daily   Dizziness    Fall    hx of with injury to left shoulder    GERD (gastroesophageal reflux disease)    takes Protonix daily and Zantac   H/O urinary frequency    Hemorrhoids    History of bladder infections    History of kidney stones    Hyperlipidemia    takes Zocor every evening   Hypertension    takes Metoprolol daily   Hypothyroidism    takes Synthroid daily   Left bundle branch block    Myocardial infarction [facility])    many yrs before 2002   Night muscle spasms    takes Robaxin 4 x day    Nocturia     Past Surgical History:  Procedure Laterality Date   CARDIAC CATHETERIZATION  05/17/02   COLONOSCOPY     CORONARY ANGIOPLASTY  x 1    CORONARY ARTERY BYPASS GRAFT  2002   x 5   eyelid surgery      INGUINAL HERNIA REPAIR Left 05/06/2020   Procedure: LEFT INGUINAL HERNIA REPAIR;  Surgeon: Donnie Mesa, MD;  Location: [place];  Service: General;  Laterality: Left;   INSERTION OF MESH Left 05/06/2020   Procedure: INSERTION OF MESH;  Surgeon: Donnie Mesa, MD;  Location: [place];  Service: General;  Laterality: Left;   LEFT HEART CATH AND CORS/GRAFTS ANGIOGRAPHY N/A 12/05/2018   Procedure: LEFT HEART CATH AND CORS/GRAFTS ANGIOGRAPHY;  Surgeon: Adrian Prows, MD;  Location: [place] [facility];  Service: Cardiovascular;  Laterality: N/A;   LUMBAR LAMINECTOMY/DECOMPRESSION MICRODISCECTOMY Bilateral 07/18/2013   Procedure: Bilateral  Lumbar four-five Lumbar Laminotomy/Microdiskectomy;  Surgeon: Hosie Spangle, MD;  Location: [place] [facility];  Service: Neurosurgery;  Laterality: Bilateral;  Bilateral Lumbar four-five Lumbar Laminotomy/Microdiskectomy   NEPHROLITHOTOMY Left 06/23/2015   Procedure: NEPHROLITHOTOMY PERCUTANEOUS;  Surgeon: [name], MD;  Location: [facility];  Service: Urology;  Laterality: Left;   PROSTATECTOMY  10/2009   skin spots removed from back -precancerous     TONSILLECTOMY     Family History  Problem Relation Age of Onset   Heart disease Father    Heart attack Father    Thyroid disease Sister    Social History   Tobacco Use   Smoking status: Never   Smokeless tobacco: Never  Substance Use Topics   Alcohol use: No  Martial status: Married  ROS   Review of Systems  Constitutional: Negative for malaise/fatigue and weight gain.  Cardiovascular:  Negative for chest pain (resovled), claudication, dyspnea on exertion (resolved), leg swelling, near-syncope, orthopnea, palpitations, paroxysmal nocturnal dyspnea and syncope.  Respiratory:  Negative for shortness of breath.   Neurological:  Negative for dizziness.     Objective:  Blood pressure 134/74, pulse 67, temperature 97.8 F (36.6 C), height 6' 2.5" (1.892 m), weight 194 lb 12.8 oz (88.4 kg), SpO2 98 %. Body mass index is 24.68 kg/m.     Physical Exam Vitals reviewed.  Constitutional:      General: He is not in acute distress.    Appearance: He is well-developed.  Cardiovascular:     Rate and Rhythm: Normal rate and regular rhythm.     Pulses: Intact distal pulses.          Carotid pulses are 2+ on the right side and 2+ on the left side.      Radial pulses are 2+ on the right side and 2+ on the left side.       Femoral pulses are 2+ on the right side and 2+ on the left side.      Popliteal pulses are 2+ on the right side and 2+ on the left side.       Dorsalis pedis pulses are 2+ on the right side and 2+ on the left side.        Posterior tibial pulses are 2+ on the right side and 2+ on the left side.     Heart sounds: Normal heart sounds, S1 normal and S2 normal. No murmur heard.   No gallop.  Pulmonary:     Effort: Pulmonary effort is normal. No accessory muscle usage or respiratory distress.     Breath sounds: Normal breath sounds. No wheezing, rhonchi or rales.  Musculoskeletal:        General: Normal range of motion.     Cervical back: Normal range of motion.  Right lower leg: Edema (1+ pitting at mid-shin) present.     Left lower leg: Edema (1+ pitting at mid-shin) present.  Skin:    General: Skin is warm and dry.  Neurological:     Mental Status: He is alert.   Laboratory examination:    CMP Latest Ref Rng & Units 07/15/2021 07/06/2021 06/29/2021  Glucose 65 - 99 mg/dL 102(H) 99 77  BUN 8 - 27 mg/dL 25 30(H) 21  Creatinine 0.76 - 1.27 mg/dL 1.92(H) 1.98(H) 1.66(H)  Sodium 134 - 144 mmol/L 138 143 141  Potassium 3.5 - 5.2 mmol/L 4.1 4.3 4.7  Chloride 96 - 106 mmol/L 101 101 104  CO2 20 - 29 mmol/L 23 24 23   Calcium 8.6 - 10.2 mg/dL 9.2 9.6 9.4  Total Protein 6.5 - 8.1 g/dL - - -  Total Bilirubin 0.3 - 1.2 mg/dL - - -  Alkaline Phos 38 - 126 U/L - - -  AST 15 - 41 U/L - - -  ALT 17 - 63 U/L - - -   CBC Latest Ref Rng & Units 04/30/2020 12/06/2018 12/05/2018  WBC 4.0 - 10.5 K/uL 6.0 - -  Hemoglobin 13.0 - 17.0 g/dL 16.0 14.2 14.3  Hematocrit 39.0 - 52.0 % 51.0 43.7 44.9  Platelets 150 - 400 K/uL 181 - -   Lipid Panel  No results found for: CHOL, TRIG, HDL, CHOLHDL, VLDL, LDLCALC, LDLDIRECT HEMOGLOBIN A1C Lab Results  Component Value Date   HGBA1C 6.9 (H) 04/30/2020   MPG 151 04/30/2020   TSH No results for input(s): TSH in the last 8760 hours.  External Labs:   10/21/2020: HDL 33, LDL 70, total cholesterol 132, triglycerides 167 TSH 1.62 BUN 28  04/30/2020: A1c 6.9%, serum creatinine 1.76  Allergies   Allergies  Allergen Reactions   Clonazepam     Headache    Lisinopril      headache    Medications Prior to Visit:   Outpatient Medications Prior to Visit  Medication Sig Dispense Refill   acetaminophen (TYLENOL) 650 MG CR tablet Take 1,300 mg by mouth every 8 (eight) hours as needed for pain. PRN     amLODipine (NORVASC) 5 MG tablet Take 2 tablets (10 mg total) by mouth daily. 180 tablet 2   aspirin EC 81 MG tablet Take 81 mg by mouth at bedtime.      B-D ULTRAFINE III SHORT PEN 31G X 8 MM MISC USE UTD  0   busPIRone (BUSPAR) 15 MG tablet Take 30 mg by mouth 2 (two) times daily.     clopidogrel (PLAVIX) 75 MG tablet Take 75 mg by mouth at bedtime.     docusate sodium (COLACE) 100 MG capsule Take 400 mg by mouth at bedtime.     famotidine (PEPCID) 40 MG tablet Take 40 mg by mouth daily.     fluticasone (FLONASE) 50 MCG/ACT nasal spray Place 1 spray into both nostrils daily.     insulin detemir (LEVEMIR) 100 UNIT/ML injection Inject 0.16 mLs (16 Units total) into the skin 2 (two) times daily. (Patient taking differently: Inject 18 Units into the skin 2 (two) times daily. 18 units in am, 22units at hs) 10 mL 1   insulin lispro (HUMALOG) 100 UNIT/ML injection Inject 0.05-0.09 mLs (5-9 Units total) into the skin 2 (two) times daily. 5 units at lunch, and 9 units after dinner. (Patient taking differently: Inject 5-15 Units into the skin 2 (two) times daily. 5-15 units as needed  (sliding scale insulin))  10 mL 1   isosorbide mononitrate (IMDUR) 120 MG 24 hr tablet TAKE 1 TABLET(120 MG) BY MOUTH DAILY 90 tablet 3   levothyroxine (SYNTHROID, LEVOTHROID) 100 MCG tablet Take 100 mcg by mouth See admin instructions. Take 1 tablet (100 mcg) by mouth daily in the morning, except on Sundays     metoprolol (LOPRESSOR) 50 MG tablet Take 50 mg by mouth 2 (two) times daily.      montelukast (SINGULAIR) 10 MG tablet Take 1 tablet (10 mg total) by mouth at bedtime. 30 tablet 11   nitroGLYCERIN (NITROSTAT) 0.4 MG SL tablet Place 1 tablet (0.4 mg total) under the tongue every 5 (five)  minutes as needed for chest pain. 15 tablet 3   ONE TOUCH ULTRA TEST test strip      pantoprazole (PROTONIX) 40 MG tablet Take 40 mg by mouth at bedtime.      ranolazine (RANEXA) 500 MG 12 hr tablet Take 2 tablets by mouth twice daily 120 tablet 0   rosuvastatin (CRESTOR) 20 MG tablet TAKE 1 TABLET BY MOUTH EVERY DAY. DISCONTINUE SIMVASTATIN 90 tablet 3   furosemide (LASIX) 40 MG tablet Take 1 tablet (40 mg total) by mouth daily. (Patient taking differently: Take 20 mg by mouth daily.) 30 tablet 3   Budeson-Glycopyrrol-Formoterol (BREZTRI AEROSPHERE) 160-9-4.8 MCG/ACT AERO Inhale 2 puffs into the lungs in the morning and at bedtime. (Patient not taking: Reported on 07/16/2021) 1 g 0   No facility-administered medications prior to visit.   Final Medications at End of Visit    Current Meds  Medication Sig   acetaminophen (TYLENOL) 650 MG CR tablet Take 1,300 mg by mouth every 8 (eight) hours as needed for pain. PRN   amLODipine (NORVASC) 5 MG tablet Take 2 tablets (10 mg total) by mouth daily.   aspirin EC 81 MG tablet Take 81 mg by mouth at bedtime.    B-D ULTRAFINE III SHORT PEN 31G X 8 MM MISC USE UTD   busPIRone (BUSPAR) 15 MG tablet Take 30 mg by mouth 2 (two) times daily.   clopidogrel (PLAVIX) 75 MG tablet Take 75 mg by mouth at bedtime.   dapagliflozin propanediol (FARXIGA) 10 MG TABS tablet Take 1 tablet (10 mg total) by mouth daily before breakfast.   docusate sodium (COLACE) 100 MG capsule Take 400 mg by mouth at bedtime.   famotidine (PEPCID) 40 MG tablet Take 40 mg by mouth daily.   fluticasone (FLONASE) 50 MCG/ACT nasal spray Place 1 spray into both nostrils daily.   insulin detemir (LEVEMIR) 100 UNIT/ML injection Inject 0.16 mLs (16 Units total) into the skin 2 (two) times daily. (Patient taking differently: Inject 18 Units into the skin 2 (two) times daily. 18 units in am, 22units at hs)   insulin lispro (HUMALOG) 100 UNIT/ML injection Inject 0.05-0.09 mLs (5-9 Units total) into  the skin 2 (two) times daily. 5 units at lunch, and 9 units after dinner. (Patient taking differently: Inject 5-15 Units into the skin 2 (two) times daily. 5-15 units as needed  (sliding scale insulin))   isosorbide mononitrate (IMDUR) 120 MG 24 hr tablet TAKE 1 TABLET(120 MG) BY MOUTH DAILY   levothyroxine (SYNTHROID, LEVOTHROID) 100 MCG tablet Take 100 mcg by mouth See admin instructions. Take 1 tablet (100 mcg) by mouth daily in the morning, except on Sundays   metoprolol (LOPRESSOR) 50 MG tablet Take 50 mg by mouth 2 (two) times daily.    montelukast (SINGULAIR) 10 MG tablet Take 1 tablet (10 mg  total) by mouth at bedtime.   nitroGLYCERIN (NITROSTAT) 0.4 MG SL tablet Place 1 tablet (0.4 mg total) under the tongue every 5 (five) minutes as needed for chest pain.   ONE TOUCH ULTRA TEST test strip    pantoprazole (PROTONIX) 40 MG tablet Take 40 mg by mouth at bedtime.    ranolazine (RANEXA) 500 MG 12 hr tablet Take 2 tablets by mouth twice daily   rosuvastatin (CRESTOR) 20 MG tablet TAKE 1 TABLET BY MOUTH EVERY DAY. DISCONTINUE SIMVASTATIN   [DISCONTINUED] furosemide (LASIX) 40 MG tablet Take 1 tablet (40 mg total) by mouth daily. (Patient taking differently: Take 20 mg by mouth daily.)   Radiology:  No results found.  Cardiac Studies:    Coronary angiogram 12/05/2018: Left main diffusely diseased and occluded proximal LAD and circumflex pulmonary artery.  RCA occluded in the proximal segment.  SVG to RCA is patent, diffusely diseased PL branch with high-grade stenosis of 90 to 99% and PDA has about a 60 to 70% mid stenosis.  Small vessels. LAD is diffusely diseased.  It is occluded in the proximal segment.  Supplied by LIMA to LAD.  Distal to the insertion, there is a 60 to 70% diffuse disease.  Small vessel distally. Free radial graft to OM1 is widely patent.  Distal circumflex has a high-grade lesion, small vessel. SVG to D1 is occluded. Normal LV systolic function, mild posterior  hypokinesis.  Normal LVEDP.  EF estimated at 55%.   PCV MYOCARDIAL PERFUSION WITH LEXISCAN 11/24/2020 Lexiscan nuclear stress test performed using 1-day protocol. Rest and stress EKG showed sinus rhythm, IVCD/tachycardia. Normal myocardial perfusion. Stress LVEF 25%. High risk study due to low stress LVEF. Further discuss with Dr. Einar Gip, suspects that the LVEF is an error, likely secondary to known LBBB.   Echocardiogram 07/08/2021:  Left ventricle cavity is mildly dilated. Mild concentric hypertrophy of the left ventricle. Moderate global hypokinesis. LVEF 30-35%. Abnormal septal wall motion due to left bundle branch block. Indeterminate diastolic filling pattern.  Moderate (Grade II) mitral regurgitation.  Mild tricuspid regurgitation.  Mild pulmonic regurgitation.  No evidence of pulmonary hypertension.  Compared to previous study on 11/18/2020, LVEF appears lower (reported 45-50% then).  EKG:  EKG 06/29/2021: Sinus rhythm at a rate of 71 bpm.  Left atrial enlargement.  Left bundle branch block, no further analysis.  Compared to EKG 11/12/2020, no significant change.  Assessment:     ICD-10-CM   1. Chronic systolic (congestive) heart failure (HCC)  I50.22     2. CKD stage 3 due to type 2 diabetes mellitus ([place])  E11.22 Ambulatory referral to Nephrology   V61.60 Basic metabolic panel    3. Coronary artery disease involving native coronary artery of native heart with other form of angina pectoris ([place])  I25.118       Meds ordered this encounter  Medications   furosemide (LASIX) 40 MG tablet    Sig: Take 1 tablet (40 mg total) by mouth daily as needed for fluid or edema.    Dispense:  30 tablet    Refill:  3   dapagliflozin propanediol (FARXIGA) 10 MG TABS tablet    Sig: Take 1 tablet (10 mg total) by mouth daily before breakfast.    Dispense:  30 tablet    Refill:  3   Medications Discontinued During This Encounter  Medication Reason   furosemide (LASIX) 40 MG tablet     Recommendations:   Jesus Young  is a 75 y.o. with known coronary  artery disease s/p CABG in 2012,  left bundle branch block that was noted  2014, hypertension, diabetes, hyperlipidemia and chronic kidney disease. History of Covid 19 infection during March 2022.   Due to complaints of left arm pain with exertion, underwent lexiscan nuclear stress test on 08/07/2018 considered high risk study due to LVEF; however, on echocardiogram had normal LVEF.  No significant changes were noted compared to the 2014, but in view of continued symptoms despite medical management, he underwent coronary angiogram on 12/05/2018 and patient was noted to have small vessel and microvascular disease. Patient again presented with exertional symptoms, therefore underwent repeat echocardiogram and nuclear stress test 11/2020.  Stress test was high risk given low stress LVEF, however this was reviewed with Dr. Einar Gip who felt it was likely an error related to known left bundle branch block as echocardiogram at that time revealed LVEF 45-50%.  Patient presented to our office 06/29/2021 with worsening shortness of breath and orthopnea.  He was started on Lasix 40 mg daily which resolved his symptoms, however repeat BMP revealed worsening renal function.  Patient was advised to reduce Lasix to as needed, however he found himself requiring Lasix daily, and therefore advised him to reduce Lasix to 20 mg daily and again repeat BMP revealed that renal function has not recovered.  Also ordered repeat echocardiogram following last office visit.  Reviewed and discussed with patient regarding results of echocardiogram which reveals reduced LVEF at 30-35%.  Counseled patient regarding pathophysiology of heart failure.  Patient's symptoms have essentially resolved and there is no clinical evidence of acute decompensated heart failure at this time.  Given worsening renal function advised patient to stop daily Lasix and only take 20 mg Lasix  once daily as needed, counseled patient regarding as needed dosing and he verbalized understanding agreement.  We will also refer patient to nephrology for further evaluation of chronic kidney disease in the setting of heart failure.  Complaint directed medical therapy for heart failure is limited at this time due to patient's current renal function status.  We will hold off on initiating Entresto or spironolactone,, however would like to consider this in the future.  We will repeat BMP in 2 weeks.  At this time we will add Farxiga 10 mg daily.  Counseled patient regarding signs symptoms that would warrant urgent or emergent evaluation.  Also advised patient will discuss with interventional cardiologist regarding whether repeat ischemic evalaution would be warranted at this time.   Follow up in 2 weeks, sooner if needed, for heart failure.    Alethia Berthold, PA-C 07/16/2021, 3:31 PM Office: [phone]

## 2021-07-16 ENCOUNTER — Ambulatory Visit: Payer: Medicare Other | Admitting: Student

## 2021-07-16 ENCOUNTER — Other Ambulatory Visit: Payer: Self-pay

## 2021-07-16 ENCOUNTER — Encounter: Payer: Self-pay | Admitting: Student

## 2021-07-16 VITALS — BP 134/74 | HR 67 | Temp 97.8°F | Ht 74.5 in | Wt 194.8 lb

## 2021-07-16 DIAGNOSIS — I25118 Atherosclerotic heart disease of native coronary artery with other forms of angina pectoris: Secondary | ICD-10-CM

## 2021-07-16 DIAGNOSIS — I5022 Chronic systolic (congestive) heart failure: Secondary | ICD-10-CM

## 2021-07-16 DIAGNOSIS — E1122 Type 2 diabetes mellitus with diabetic chronic kidney disease: Secondary | ICD-10-CM | POA: Diagnosis not present

## 2021-07-16 DIAGNOSIS — N183 Chronic kidney disease, stage 3 unspecified: Secondary | ICD-10-CM | POA: Diagnosis not present

## 2021-07-16 LAB — BASIC METABOLIC PANEL
BUN/Creatinine Ratio: 13 (ref 10–24)
BUN: 25 mg/dL (ref 8–27)
CO2: 23 mmol/L (ref 20–29)
Calcium: 9.2 mg/dL (ref 8.6–10.2)
Chloride: 101 mmol/L (ref 96–106)
Creatinine, Ser: 1.92 mg/dL — ABNORMAL HIGH (ref 0.76–1.27)
Glucose: 102 mg/dL — ABNORMAL HIGH (ref 65–99)
Potassium: 4.1 mmol/L (ref 3.5–5.2)
Sodium: 138 mmol/L (ref 134–144)
eGFR: 36 mL/min/{1.73_m2} — ABNORMAL LOW (ref >59)

## 2021-07-16 MED ORDER — FUROSEMIDE 40 MG PO TABS: 40.0000 mg | ORAL_TABLET | Freq: Every day | ORAL | 3 refills | Status: DC | PRN

## 2021-07-16 MED ORDER — DAPAGLIFLOZIN PROPANEDIOL 10 MG PO TABS: 10.0000 mg | ORAL_TABLET | Freq: Every day | ORAL | 3 refills | Status: DC

## 2021-07-16 NOTE — Progress Notes (Signed)
Reviewed at today's office visit.

## 2021-07-23 ENCOUNTER — Telehealth: Payer: Self-pay | Admitting: Pulmonary Disease

## 2021-07-23 NOTE — Telephone Encounter (Signed)
Called patient but he was not available. Asked the person who answered the phone to have him to give our office a call back once he returns home.

## 2021-07-23 NOTE — Telephone Encounter (Signed)
[phone] calling back

## 2021-07-23 NOTE — Telephone Encounter (Signed)
Called and spoke with patient. He stated he coughed up a small amount of blood about 2 hours ago. He described the blood as a small, congealed blob. It was the size of the nail on his thumb. He has a chronic cough and prior to this episode, he was flossing his teeth. He has a gag reflex and had started to gag and cough. This is when he coughed up the blood.   He denied any SOB, chest pain or any distress. I offered to send this message to the provider of the day but he stated that since he was not in any distress, he would prefer Dr. Loanne Drilling to address his message.   I did go ahead and get him scheduled to see Dr. Loanne Drilling on 07/27/21 at 915am.   Dr. Loanne Drilling, can you please advise? Thanks!

## 2021-07-24 ENCOUNTER — Encounter: Payer: Self-pay | Admitting: Pulmonary Disease

## 2021-07-24 NOTE — Telephone Encounter (Signed)
Called patient. He confirmed that this was a single episode of small clot with not further events. Denies recent nosebleed or respiratory infection. This is likely related to his chronic cough. No indication to be seen urgently. We will further discuss his dyspnea and single episode of hemoptysis at our visit scheduled next week. Patient expressed understanding and agreed to this plan.

## 2021-07-24 NOTE — Telephone Encounter (Signed)
Patient is returning phone call. Patient phone number is [phone].

## 2021-07-24 NOTE — Telephone Encounter (Signed)
Called patient. No answer. Left voicemail with callback number.  Depending on patient's symptoms, can consider CT chest for further evaluation. However if he is persistently coughing up blood, he may require more urgent testing including hospital admission for bronchoscopy. Please page me when patient calls back.

## 2021-07-24 NOTE — Telephone Encounter (Signed)
Routing back to Dr. Loanne Drilling.

## 2021-07-27 ENCOUNTER — Ambulatory Visit: Payer: Medicare Other | Admitting: Pulmonary Disease

## 2021-07-27 ENCOUNTER — Encounter: Payer: Self-pay | Admitting: Pulmonary Disease

## 2021-07-27 ENCOUNTER — Other Ambulatory Visit: Payer: Self-pay

## 2021-07-27 VITALS — BP 122/70 | HR 65 | Temp 97.3°F | Ht 74.0 in | Wt 193.2 lb

## 2021-07-27 DIAGNOSIS — R053 Chronic cough: Secondary | ICD-10-CM

## 2021-07-27 DIAGNOSIS — I5022 Chronic systolic (congestive) heart failure: Secondary | ICD-10-CM | POA: Diagnosis not present

## 2021-07-27 DIAGNOSIS — R059 Cough, unspecified: Secondary | ICD-10-CM

## 2021-07-27 DIAGNOSIS — U099 Post covid-19 condition, unspecified: Secondary | ICD-10-CM

## 2021-07-27 MED ORDER — BREZTRI AEROSPHERE 160-9-4.8 MCG/ACT IN AERO: 2.0000 | INHALATION_SPRAY | Freq: Two times a day (BID) | RESPIRATORY_TRACT | 0 refills | Status: DC

## 2021-07-27 NOTE — Progress Notes (Signed)
Subjective:   [name] GENDER: male DOB: October 25, 1946, MRN: [id number]   HPI  Chief Complaint  Patient presents with   Follow-up    Cough w/blood not all the time    Reason for Visit: Follow-up for cough  [name] is a 75 year old male never smoker with CAD s/p CABG, brittle DM2, HTN, allergic rhinitis who presents for follow-up  Synopsis: He had been seen by his PCP regarding chronic cough for >5 years. On benadryl in the last 2 months which seems to help decrease in frequency. He still has nasal congestion and runny nose. Not currently on nasal spray. Currently on famotidine and protonix daily though he does have occasional bloating and gas feeling. In the mornings he reports vague chest fullness/pressure/tightness. In the last three months, when he coughs in the morning, he will bring up possibly 1/3 of dixie cup of sputum.   12/29/20 Since our last visit, he has been taking benadryl 25 mg twice a day. Cough with minimal sputum production. Though improved cough does seem to occur when eating. On prior telephone call he reports that he will sometimes choke on food and need help from his wife to help. He has slowed down his eating to compensate. He does still have nasal congestion as well even with benadryl use. He completed PFTs for this visit and felt some difficulty with the test due to narrowing his airflow into the mouthpiece.  06/25/21 Since our our last visit, he tested positive for COVID-19 in March 2022. He reports increased shortness of breath that began in the last few months. Also having intermittent arm pain. He has a dry hacking cough that began two weeks ago. Compliant with montelukast. Not on any inhalers. He reports that he will sometimes wheeze when waking up or when laying down.   07/27/21 Since our last visit, he was seen by cardiology and started on lasix for heart failure. After starting diuretics, he immediately had improvement with  his shortness of breath. He never started the [place] and has not felt that he needed. He has a daily cough that varies from intensity from day to day. Half the time he will produce a brownish. No wheezing. Usually occurs in the morning that can be more intense and will continue throughout the day  Review of Systems  Constitutional:  Negative for chills, diaphoresis, fever, malaise/fatigue and weight loss.  HENT:  Negative for congestion.   Respiratory:  Positive for cough. Negative for hemoptysis, sputum production, shortness of breath and wheezing.   Cardiovascular:  Negative for chest pain, palpitations and leg swelling.    Social History: Never smoker  Second hand smoke Insurance underwriter  Past Medical History:  Diagnosis Date   Anxiety    Arthritis    back and knees   Bronchitis    hx of 2015   Cancer ([place])    prostate   Cataract    immature on right   Chronic back pain    CKD (chronic kidney disease)    Constipation    takes Colace at bedtime   Coronary artery disease    takes Plavix daily but is on hold for surgery   Depression    Diabetes mellitus without complication (HCC)    takes Levemir and Humalog daily   Dizziness    Fall    hx of with injury to left shoulder    GERD (gastroesophageal reflux disease)    takes Protonix daily and  Zantac   H/O urinary frequency    Hemorrhoids    History of bladder infections    History of kidney stones    Hyperlipidemia    takes Zocor every evening   Hypertension    takes Metoprolol daily   Hypothyroidism    takes Synthroid daily   Left bundle branch block    Myocardial infarction [facility])    many yrs before 2002   Night muscle spasms    takes Robaxin 4 x day    Nocturia      Allergies  Allergen Reactions   Clonazepam     Headache    Lisinopril     headache     Outpatient Medications Prior to Visit  Medication Sig Dispense Refill   acetaminophen (TYLENOL) 650 MG CR tablet Take 1,300 mg by mouth  every 8 (eight) hours as needed for pain. PRN     amLODipine (NORVASC) 5 MG tablet Take 2 tablets (10 mg total) by mouth daily. 180 tablet 2   aspirin EC 81 MG tablet Take 81 mg by mouth at bedtime.      B-D ULTRAFINE III SHORT PEN 31G X 8 MM MISC USE UTD  0   busPIRone (BUSPAR) 15 MG tablet Take 30 mg by mouth 2 (two) times daily.     clopidogrel (PLAVIX) 75 MG tablet Take 75 mg by mouth at bedtime.     dapagliflozin propanediol (FARXIGA) 10 MG TABS tablet Take 1 tablet (10 mg total) by mouth daily before breakfast. 30 tablet 3   docusate sodium (COLACE) 100 MG capsule Take 400 mg by mouth at bedtime.     famotidine (PEPCID) 40 MG tablet Take 40 mg by mouth daily.     fluticasone (FLONASE) 50 MCG/ACT nasal spray Place 1 spray into both nostrils daily.     furosemide (LASIX) 40 MG tablet Take 1 tablet (40 mg total) by mouth daily as needed for fluid or edema. 30 tablet 3   insulin detemir (LEVEMIR) 100 UNIT/ML injection Inject 0.16 mLs (16 Units total) into the skin 2 (two) times daily. (Patient taking differently: Inject 18 Units into the skin 2 (two) times daily. 18 units in am, 22units at hs) 10 mL 1   insulin lispro (HUMALOG) 100 UNIT/ML injection Inject 0.05-0.09 mLs (5-9 Units total) into the skin 2 (two) times daily. 5 units at lunch, and 9 units after dinner. (Patient taking differently: Inject 5-15 Units into the skin 2 (two) times daily. 5-15 units as needed  (sliding scale insulin)) 10 mL 1   isosorbide mononitrate (IMDUR) 120 MG 24 hr tablet TAKE 1 TABLET(120 MG) BY MOUTH DAILY 90 tablet 3   levothyroxine (SYNTHROID, LEVOTHROID) 100 MCG tablet Take 100 mcg by mouth See admin instructions. Take 1 tablet (100 mcg) by mouth daily in the morning, except on Sundays     metoprolol (LOPRESSOR) 50 MG tablet Take 50 mg by mouth 2 (two) times daily.      montelukast (SINGULAIR) 10 MG tablet Take 1 tablet (10 mg total) by mouth at bedtime. 30 tablet 11   nitroGLYCERIN (NITROSTAT) 0.4 MG SL tablet  Place 1 tablet (0.4 mg total) under the tongue every 5 (five) minutes as needed for chest pain. 15 tablet 3   ONE TOUCH ULTRA TEST test strip      pantoprazole (PROTONIX) 40 MG tablet Take 40 mg by mouth at bedtime.      ranolazine (RANEXA) 500 MG 12 hr tablet Take 2 tablets by mouth twice daily  120 tablet 0   rosuvastatin (CRESTOR) 20 MG tablet TAKE 1 TABLET BY MOUTH EVERY DAY. DISCONTINUE SIMVASTATIN 90 tablet 3   Budeson-Glycopyrrol-Formoterol (BREZTRI AEROSPHERE) 160-9-4.8 MCG/ACT AERO Inhale 2 puffs into the lungs in the morning and at bedtime. (Patient not taking: No sig reported) 1 g 0   No facility-administered medications prior to visit.    Objective:   Vitals:   07/27/21 0916  BP: 122/70  Pulse: 65  Temp: (!) 97.3 F (36.3 C)  TempSrc: Oral  SpO2: 98%  Weight: 193 lb 3.2 oz (87.6 kg)  Height: 6\' 2"  (1.88 m)   SpO2: 98 % O2 Device: None (Room air)  Physical Exam: General: Well-appearing, no acute distress HENT: [place], AT Eyes: EOMI, no scleral icterus Respiratory: Clear to auscultation bilaterally.  No crackles, wheezing or rales Cardiovascular: RRR, -M/R/G, no JVD Extremities:-Edema,-tenderness Neuro: AAO x4, CNII-XII grossly intact Psych: Normal mood, normal affect  Data Reviewed:  Imaging: CXR 10/30/15 S/p CABG. No infiltrate, effusion or edema CT Chest 01/09/21 - S/p CABG changes. Mild biapical pleuroparenchymal scarring. Scattered calcified granulomas. Subsegmental atelectasis in the lung bases.  PFT: 12/29/20 FVC 3.63 (68%) FEV1 2.88 (73%) Ratio 76  TLC 75% DLCO 70% Interpretation: Mild restrictive defect with mildly reduced DLCO   Labs: Absolute Eos 10/30/15 200  Echocardiogram: 07/08/21 EF 30-35%, septal wall motion due to LBBB    Assessment & Plan:   Discussion: 75 year old male never smoker with CAD s/p CABG, DM2, HTN and allergic rhinitis who presents for follow-up.   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 One spray per nare nightly  Health Maintenance Immunization History  Administered Date(s) Administered   DTaP 11/25/1994   Influenza, High Dose Seasonal PF 10/02/2014, 08/01/2015, 07/20/2016   Influenza, Quadrivalent, Recombinant, Inj, Pf 07/26/2017, 07/17/2019   Influenza,inj,Quad PF,6-35 Mos 01/23/2020   Influenza-Unspecified 10/11/2011, 12/04/2012, 09/24/2013, 08/15/2018   PFIZER Comirnaty(Gray Top)Covid-19 Tri-Sucrose Vaccine 04/21/2021   PFIZER(Purple Top)SARS-COV-2 Vaccination 01/02/2020, 01/23/2020, 08/11/2020   Pneumococcal Conjugate-13 05/24/2013   Pneumococcal Polysaccharide-23 06/12/2019   Tdap 05/24/2013   CT Lung Screen - not indicated  No orders of the defined types were placed in this encounter. Meds ordered this encounter  Medications   Budeson-Glycopyrrol-Formoterol (BREZTRI AEROSPHERE) 160-9-4.8 MCG/ACT AERO    Sig: Inhale 2 puffs into the lungs in the morning and at bedtime.    Dispense:  5.9 g    Refill:  0    Order Specific Question:   Lot Number?    Answer:   [id number]    Order Specific Question:   Manufacturer?    Answer:   [employer] [[id number]]    Return in about 1 month (around 08/26/2021).  I have spent a total time of 32-minutes on the day of the appointment reviewing prior documentation, coordinating care and discussing medical diagnosis and plan with the patient/family. Past medical history, allergies, medications were reviewed. Pertinent imaging, labs and tests included in this note have been reviewed and interpreted independently by  me.   [place], MD [facility] 07/27/2021 9:23 AM  Office Number [phone]

## 2021-07-27 NOTE — Patient Instructions (Addendum)
  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 THIS IS SAMPLE --If the inhaler helps, please call our office and we will prescribe Symbicort 66mcg or 160 mg strength for regular use --CONTINUE montelukast 10 mg daily  Chronic systolic heart failure --Responded well to diuretics --Follow-up with Cardiology this week  Allergic Rhinitis --CONTINUE benadryl 25 mg twice daily --CONTINUE flonase (generic: fluticasone). One spray per nare nightly  Follow-up with in one month

## 2021-07-29 DIAGNOSIS — E1122 Type 2 diabetes mellitus with diabetic chronic kidney disease: Secondary | ICD-10-CM | POA: Diagnosis not present

## 2021-07-29 DIAGNOSIS — N183 Chronic kidney disease, stage 3 unspecified: Secondary | ICD-10-CM | POA: Diagnosis not present

## 2021-07-29 NOTE — Progress Notes (Signed)
Primary Physician:  Merrilee Seashore, MD   [name], male    DOB: 09-01-1946, 75 y.o.   MRN: [id number]  Subjective:    Chief Complaint  Patient presents with   Heart Failure   Follow-up   Coronary Artery Disease   medtitration    HPI: Jesus Young  is a 75 y.o. male  with known coronary artery disease s/p CABG in 2012,  left bundle branch block that was noted  2014, hypertension, diabetes, hyperlipidemia and chronic kidney disease. History of Covid 19 infection during March 2022.   Due to complaints of left arm pain with exertion, underwent lexiscan nuclear stress test on 08/07/2018 considered high risk study due to LVEF; however, on echocardiogram had normal LVEF.  No significant changes were noted compared to the 2014, but in view of continued symptoms despite medical management, he underwent coronary angiogram on 12/05/2018 and patient was noted to have small vessel and microvascular disease. Patient again presented with exertional symptoms, therefore underwent repeat echocardiogram and nuclear stress test 11/2020.  Stress test was high risk given low stress LVEF, however this was reviewed with Dr. Einar Gip who felt it was likely an error related to known left bundle branch block as echocardiogram at that time revealed LVEF 45-50%.  Patient presented to our office 06/29/2021 with worsening shortness of breath and orthopnea, repeat echocardiogram revealed LVEF had reduced further to 30-35%.  However up titration of guideline directed medical therapy has been limited due to worsening renal function.  Patient presents for 2-week follow-up of heart failure.  At last office visit patient started [place].  Renal function has remained stable, but is reduced compared to previous.  He is presently doing well with no recurrence of chest pain, swelling, or dyspnea.  He does continue to have occasional days where he feels fatigued.  Unfortunately he has not yet seen  nephrology.  Patient has not needed Lasix since last office visit.  Past Medical History:  Diagnosis Date   Anxiety    Arthritis    back and knees   Bronchitis    hx of 2015   Cancer ([place])    prostate   Cataract    immature on right   Chronic back pain    CKD (chronic kidney disease)    Constipation    takes Colace at bedtime   Coronary artery disease    takes Plavix daily but is on hold for surgery   Depression    Diabetes mellitus without complication (HCC)    takes Levemir and Humalog daily   Dizziness    Fall    hx of with injury to left shoulder    GERD (gastroesophageal reflux disease)    takes Protonix daily and Zantac   H/O urinary frequency    Hemorrhoids    History of bladder infections    History of kidney stones    Hyperlipidemia    takes Zocor every evening   Hypertension    takes Metoprolol daily   Hypothyroidism    takes Synthroid daily   Left bundle branch block    Myocardial infarction [facility])    many yrs before 2002   Night muscle spasms    takes Robaxin 4 x day    Nocturia    Past Surgical History:  Procedure Laterality Date   CARDIAC CATHETERIZATION  05/17/02   COLONOSCOPY     CORONARY ANGIOPLASTY     x 1    CORONARY ARTERY BYPASS GRAFT  2002   x 5   eyelid surgery      INGUINAL HERNIA REPAIR Left 05/06/2020   Procedure: LEFT INGUINAL HERNIA REPAIR;  Surgeon: Donnie Mesa, MD;  Location: [place];  Service: General;  Laterality: Left;   INSERTION OF MESH Left 05/06/2020   Procedure: INSERTION OF MESH;  Surgeon: Donnie Mesa, MD;  Location: [place];  Service: General;  Laterality: Left;   LEFT HEART CATH AND CORS/GRAFTS ANGIOGRAPHY N/A 12/05/2018   Procedure: LEFT HEART CATH AND CORS/GRAFTS ANGIOGRAPHY;  Surgeon: Adrian Prows, MD;  Location: [place] CV LAB;  Service: Cardiovascular;  Laterality: N/A;   LUMBAR LAMINECTOMY/DECOMPRESSION MICRODISCECTOMY Bilateral 07/18/2013   Procedure: Bilateral Lumbar four-five Lumbar Laminotomy/Microdiskectomy;   Surgeon: Hosie Spangle, MD;  Location: [place] [facility];  Service: Neurosurgery;  Laterality: Bilateral;  Bilateral Lumbar four-five Lumbar Laminotomy/Microdiskectomy   NEPHROLITHOTOMY Left 06/23/2015   Procedure: NEPHROLITHOTOMY PERCUTANEOUS;  Surgeon: [name], MD;  Location: [facility];  Service: Urology;  Laterality: Left;   PROSTATECTOMY  10/2009   skin spots removed from back -precancerous     TONSILLECTOMY     Family History  Problem Relation Age of Onset   Heart disease Father    Heart attack Father    Thyroid disease Sister    Social History   Tobacco Use   Smoking status: Never   Smokeless tobacco: Never  Substance Use Topics   Alcohol use: No  Martial status: Married  ROS   Review of Systems  Constitutional: Positive for malaise/fatigue (occasional). Negative for weight gain.  Cardiovascular:  Negative for chest pain, claudication, dyspnea on exertion, leg swelling, near-syncope, orthopnea, palpitations, paroxysmal nocturnal dyspnea and syncope.  Respiratory:  Negative for shortness of breath.      Objective:  Blood pressure 115/68, pulse 67, temperature 97.6 F (36.4 C), temperature source Temporal, resp. rate 16, height 6\' 2"  (1.88 m), weight 193 lb 6.4 oz (87.7 kg), SpO2 98 %. Body mass index is 24.83 kg/m.     Physical Exam Vitals reviewed.  Constitutional:      General: He is not in acute distress.    Appearance: He is well-developed.  Cardiovascular:     Rate and Rhythm: Normal rate and regular rhythm.     Pulses: Intact distal pulses.          Carotid pulses are 2+ on the right side and 2+ on the left side.      Radial pulses are 2+ on the right side and 2+ on the left side.       Femoral pulses are 2+ on the right side and 2+ on the left side.      Popliteal pulses are 2+ on the right side and 2+ on the left side.       Dorsalis pedis pulses are 2+ on the right side and 2+ on the left side.       Posterior tibial pulses are 2+ on the right side and 2+  on the left side.     Heart sounds: Normal heart sounds, S1 normal and S2 normal. No murmur heard.   No gallop.  Pulmonary:     Effort: Pulmonary effort is normal. No accessory muscle usage or respiratory distress.     Breath sounds: Normal breath sounds. No wheezing, rhonchi or rales.  Musculoskeletal:        General: Normal range of motion.     Cervical back: Normal range of motion.     Right lower leg: No edema.  Left lower leg: No edema.  Skin:    General: Skin is warm and dry.  Neurological:     Mental Status: He is alert.   Laboratory examination:    CMP Latest Ref Rng & Units 07/29/2021 07/15/2021 07/06/2021  Glucose 65 - 99 mg/dL 139(H) 102(H) 99  BUN 8 - 27 mg/dL 29(H) 25 30(H)  Creatinine 0.76 - 1.27 mg/dL 1.99(H) 1.92(H) 1.98(H)  Sodium 134 - 144 mmol/L 135 138 143  Potassium 3.5 - 5.2 mmol/L 4.6 4.1 4.3  Chloride 96 - 106 mmol/L 100 101 101  CO2 20 - 29 mmol/L 20 23 24   Calcium 8.6 - 10.2 mg/dL 9.5 9.2 9.6  Total Protein 6.5 - 8.1 g/dL - - -  Total Bilirubin 0.3 - 1.2 mg/dL - - -  Alkaline Phos 38 - 126 U/L - - -  AST 15 - 41 U/L - - -  ALT 17 - 63 U/L - - -   CBC Latest Ref Rng & Units 04/30/2020 12/06/2018 12/05/2018  WBC 4.0 - 10.5 K/uL 6.0 - -  Hemoglobin 13.0 - 17.0 g/dL 16.0 14.2 14.3  Hematocrit 39.0 - 52.0 % 51.0 43.7 44.9  Platelets 150 - 400 K/uL 181 - -   Lipid Panel  No results found for: CHOL, TRIG, HDL, CHOLHDL, VLDL, LDLCALC, LDLDIRECT HEMOGLOBIN A1C Lab Results  Component Value Date   HGBA1C 6.9 (H) 04/30/2020   MPG 151 04/30/2020   TSH No results for input(s): TSH in the last 8760 hours.  External Labs:   10/21/2020: HDL 33, LDL 70, total cholesterol 132, triglycerides 167 TSH 1.62 BUN 28  04/30/2020: A1c 6.9%, serum creatinine 1.76  Allergies   Allergies  Allergen Reactions   Clonazepam Other (See Comments)    Headache    Lisinopril Other (See Comments)    headache    Medications Prior to Visit:   Outpatient Medications  Prior to Visit  Medication Sig Dispense Refill   acetaminophen (TYLENOL) 650 MG CR tablet Take 1,300 mg by mouth every 8 (eight) hours as needed for pain. PRN     amLODipine (NORVASC) 5 MG tablet Take 2 tablets (10 mg total) by mouth daily. 180 tablet 2   aspirin EC 81 MG tablet Take 81 mg by mouth at bedtime.      B-D ULTRAFINE III SHORT PEN 31G X 8 MM MISC USE UTD  0   Budeson-Glycopyrrol-Formoterol (BREZTRI AEROSPHERE) 160-9-4.8 MCG/ACT AERO Inhale 2 puffs into the lungs in the morning and at bedtime. 1 g 0   Budeson-Glycopyrrol-Formoterol (BREZTRI AEROSPHERE) 160-9-4.8 MCG/ACT AERO Inhale 2 puffs into the lungs in the morning and at bedtime. 5.9 g 0   busPIRone (BUSPAR) 15 MG tablet Take 30 mg by mouth 2 (two) times daily.     clopidogrel (PLAVIX) 75 MG tablet Take 75 mg by mouth at bedtime.     dapagliflozin propanediol (FARXIGA) 10 MG TABS tablet Take 1 tablet (10 mg total) by mouth daily before breakfast. 30 tablet 3   docusate sodium (COLACE) 100 MG capsule Take 400 mg by mouth at bedtime.     famotidine (PEPCID) 40 MG tablet Take 40 mg by mouth daily.     fluticasone (FLONASE) 50 MCG/ACT nasal spray Place 1 spray into both nostrils daily.     insulin detemir (LEVEMIR) 100 UNIT/ML injection Inject 0.16 mLs (16 Units total) into the skin 2 (two) times daily. (Patient taking differently: Inject 18 Units into the skin 2 (two) times daily. 18 units in am, 22units  at hs) 10 mL 1   insulin lispro (HUMALOG) 100 UNIT/ML injection Inject 0.05-0.09 mLs (5-9 Units total) into the skin 2 (two) times daily. 5 units at lunch, and 9 units after dinner. (Patient taking differently: Inject 5-15 Units into the skin 2 (two) times daily. 5-15 units as needed  (sliding scale insulin)) 10 mL 1   isosorbide mononitrate (IMDUR) 120 MG 24 hr tablet TAKE 1 TABLET(120 MG) BY MOUTH DAILY 90 tablet 3   levothyroxine (SYNTHROID, LEVOTHROID) 100 MCG tablet Take 100 mcg by mouth See admin instructions. Take 1 tablet (100  mcg) by mouth daily in the morning, except on Sundays     metoprolol (LOPRESSOR) 50 MG tablet Take 50 mg by mouth 2 (two) times daily.      montelukast (SINGULAIR) 10 MG tablet Take 1 tablet (10 mg total) by mouth at bedtime. 30 tablet 11   nitroGLYCERIN (NITROSTAT) 0.4 MG SL tablet Place 1 tablet (0.4 mg total) under the tongue every 5 (five) minutes as needed for chest pain. 15 tablet 3   ONE TOUCH ULTRA TEST test strip      pantoprazole (PROTONIX) 40 MG tablet Take 40 mg by mouth at bedtime.      ranolazine (RANEXA) 500 MG 12 hr tablet Take 2 tablets by mouth twice daily 120 tablet 0   rosuvastatin (CRESTOR) 20 MG tablet TAKE 1 TABLET BY MOUTH EVERY DAY. DISCONTINUE SIMVASTATIN 90 tablet 3   furosemide (LASIX) 40 MG tablet Take 1 tablet (40 mg total) by mouth daily as needed for fluid or edema. (Patient not taking: Reported on 07/30/2021) 30 tablet 3   No facility-administered medications prior to visit.   Final Medications at End of Visit    Current Meds  Medication Sig   acetaminophen (TYLENOL) 650 MG CR tablet Take 1,300 mg by mouth every 8 (eight) hours as needed for pain. PRN   amLODipine (NORVASC) 5 MG tablet Take 2 tablets (10 mg total) by mouth daily.   aspirin EC 81 MG tablet Take 81 mg by mouth at bedtime.    B-D ULTRAFINE III SHORT PEN 31G X 8 MM MISC USE UTD   Budeson-Glycopyrrol-Formoterol (BREZTRI AEROSPHERE) 160-9-4.8 MCG/ACT AERO Inhale 2 puffs into the lungs in the morning and at bedtime.   Budeson-Glycopyrrol-Formoterol (BREZTRI AEROSPHERE) 160-9-4.8 MCG/ACT AERO Inhale 2 puffs into the lungs in the morning and at bedtime.   busPIRone (BUSPAR) 15 MG tablet Take 30 mg by mouth 2 (two) times daily.   clopidogrel (PLAVIX) 75 MG tablet Take 75 mg by mouth at bedtime.   dapagliflozin propanediol (FARXIGA) 10 MG TABS tablet Take 1 tablet (10 mg total) by mouth daily before breakfast.   docusate sodium (COLACE) 100 MG capsule Take 400 mg by mouth at bedtime.   famotidine  (PEPCID) 40 MG tablet Take 40 mg by mouth daily.   fluticasone (FLONASE) 50 MCG/ACT nasal spray Place 1 spray into both nostrils daily.   insulin detemir (LEVEMIR) 100 UNIT/ML injection Inject 0.16 mLs (16 Units total) into the skin 2 (two) times daily. (Patient taking differently: Inject 18 Units into the skin 2 (two) times daily. 18 units in am, 22units at hs)   insulin lispro (HUMALOG) 100 UNIT/ML injection Inject 0.05-0.09 mLs (5-9 Units total) into the skin 2 (two) times daily. 5 units at lunch, and 9 units after dinner. (Patient taking differently: Inject 5-15 Units into the skin 2 (two) times daily. 5-15 units as needed  (sliding scale insulin))   isosorbide mononitrate (IMDUR) 120  MG 24 hr tablet TAKE 1 TABLET(120 MG) BY MOUTH DAILY   levothyroxine (SYNTHROID, LEVOTHROID) 100 MCG tablet Take 100 mcg by mouth See admin instructions. Take 1 tablet (100 mcg) by mouth daily in the morning, except on Sundays   metoprolol (LOPRESSOR) 50 MG tablet Take 50 mg by mouth 2 (two) times daily.    montelukast (SINGULAIR) 10 MG tablet Take 1 tablet (10 mg total) by mouth at bedtime.   nitroGLYCERIN (NITROSTAT) 0.4 MG SL tablet Place 1 tablet (0.4 mg total) under the tongue every 5 (five) minutes as needed for chest pain.   ONE TOUCH ULTRA TEST test strip    pantoprazole (PROTONIX) 40 MG tablet Take 40 mg by mouth at bedtime.    ranolazine (RANEXA) 500 MG 12 hr tablet Take 2 tablets by mouth twice daily   rosuvastatin (CRESTOR) 20 MG tablet TAKE 1 TABLET BY MOUTH EVERY DAY. DISCONTINUE SIMVASTATIN   Radiology:  No results found.  Cardiac Studies:    Coronary angiogram 12/05/2018: Left main diffusely diseased and occluded proximal LAD and circumflex pulmonary artery.  RCA occluded in the proximal segment.  SVG to RCA is patent, diffusely diseased PL branch with high-grade stenosis of 90 to 99% and PDA has about a 60 to 70% mid stenosis.  Small vessels. LAD is diffusely diseased.  It is occluded in the  proximal segment.  Supplied by LIMA to LAD.  Distal to the insertion, there is a 60 to 70% diffuse disease.  Small vessel distally. Free radial graft to OM1 is widely patent.  Distal circumflex has a high-grade lesion, small vessel. SVG to D1 is occluded. Normal LV systolic function, mild posterior hypokinesis.  Normal LVEDP.  EF estimated at 55%.   PCV MYOCARDIAL PERFUSION WITH LEXISCAN 11/24/2020 Lexiscan nuclear stress test performed using 1-day protocol. Rest and stress EKG showed sinus rhythm, IVCD/tachycardia. Normal myocardial perfusion. Stress LVEF 25%. High risk study due to low stress LVEF. Further discuss with Dr. Einar Gip, suspects that the LVEF is an error, likely secondary to known LBBB.   Echocardiogram 07/08/2021:  Left ventricle cavity is mildly dilated. Mild concentric hypertrophy of the left ventricle. Moderate global hypokinesis. LVEF 30-35%. Abnormal septal wall motion due to left bundle branch block. Indeterminate diastolic filling pattern.  Moderate (Grade II) mitral regurgitation.  Mild tricuspid regurgitation.  Mild pulmonic regurgitation.  No evidence of pulmonary hypertension.  Compared to previous study on 11/18/2020, LVEF appears lower (reported 45-50% then).  EKG:  EKG 06/29/2021: Sinus rhythm at a rate of 71 bpm.  Left atrial enlargement.  Left bundle branch block, no further analysis.  Compared to EKG 11/12/2020, no significant change.  Assessment:     ICD-10-CM   1. Chronic systolic (congestive) heart failure (HCC)  I50.22     2. Coronary artery disease involving native coronary artery of native heart with other form of angina pectoris ([place])  I25.118       No orders of the defined types were placed in this encounter.  There are no discontinued medications.  Recommendations:   Jesus Young  is a 75 y.o. with known coronary artery disease s/p CABG in 2012,  left bundle branch block that was noted  2014, hypertension, diabetes, hyperlipidemia and  chronic kidney disease. History of Covid 19 infection during March 2022.   Due to complaints of left arm pain with exertion, underwent lexiscan nuclear stress test on 08/07/2018 considered high risk study due to LVEF; however, on echocardiogram had normal LVEF.  No significant changes  were noted compared to the 2014, but in view of continued symptoms despite medical management, he underwent coronary angiogram on 12/05/2018 and patient was noted to have small vessel and microvascular disease. Patient again presented with exertional symptoms, therefore underwent repeat echocardiogram and nuclear stress test 11/2020.  Stress test was high risk given low stress LVEF, however this was reviewed with Dr. Einar Gip who felt it was likely an error related to known left bundle branch block as echocardiogram at that time revealed LVEF 45-50%.  Patient presented to our office 06/29/2021 with worsening shortness of breath and orthopnea, repeat echocardiogram revealed LVEF had reduced further to 30-35%.  However up titration of guideline directed medical therapy has been limited due to worsening renal function.  Patient presents for 2-week follow-up of heart failure.  At last office visit patient started [place].  Renal function has remained stable, but is reduced compared to previous.  Overall patient is feeling much improved with no recurrence of chest pain, orthopnea, leg swelling, shortness of breath.  We will continue current medications including as needed Lasix.  There is no clinical evidence of acute heart failure at today's office visit.  Advised patient to follow-up on nephrology consult, as I would like to uptitrate guideline directed medical therapy, however this is limited due to current renal status.  Follow-up in 6 weeks, sooner if needed, for HFrEF.   Alethia Berthold, PA-C 07/30/2021, 1:26 PM Office: [phone]

## 2021-07-30 ENCOUNTER — Other Ambulatory Visit: Payer: Self-pay

## 2021-07-30 ENCOUNTER — Encounter: Payer: Self-pay | Admitting: Student

## 2021-07-30 ENCOUNTER — Ambulatory Visit: Payer: Medicare Other | Admitting: Student

## 2021-07-30 VITALS — BP 115/68 | HR 67 | Temp 97.6°F | Resp 16 | Ht 74.0 in | Wt 193.4 lb

## 2021-07-30 DIAGNOSIS — I25118 Atherosclerotic heart disease of native coronary artery with other forms of angina pectoris: Secondary | ICD-10-CM | POA: Diagnosis not present

## 2021-07-30 DIAGNOSIS — I5022 Chronic systolic (congestive) heart failure: Secondary | ICD-10-CM | POA: Diagnosis not present

## 2021-07-30 LAB — BASIC METABOLIC PANEL
BUN/Creatinine Ratio: 15 (ref 10–24)
BUN: 29 mg/dL — ABNORMAL HIGH (ref 8–27)
CO2: 20 mmol/L (ref 20–29)
Calcium: 9.5 mg/dL (ref 8.6–10.2)
Chloride: 100 mmol/L (ref 96–106)
Creatinine, Ser: 1.99 mg/dL — ABNORMAL HIGH (ref 0.76–1.27)
Glucose: 139 mg/dL — ABNORMAL HIGH (ref 65–99)
Potassium: 4.6 mmol/L (ref 3.5–5.2)
Sodium: 135 mmol/L (ref 134–144)
eGFR: 34 mL/min/{1.73_m2} — ABNORMAL LOW (ref >59)

## 2021-07-31 ENCOUNTER — Telehealth: Payer: Self-pay | Admitting: Student

## 2021-08-04 ENCOUNTER — Other Ambulatory Visit: Payer: Self-pay | Admitting: Student

## 2021-08-04 DIAGNOSIS — I25118 Atherosclerotic heart disease of native coronary artery with other forms of angina pectoris: Secondary | ICD-10-CM

## 2021-08-07 ENCOUNTER — Ambulatory Visit: Payer: Medicare Other | Admitting: Student

## 2021-08-10 ENCOUNTER — Encounter: Payer: Self-pay | Admitting: Pulmonary Disease

## 2021-08-10 ENCOUNTER — Ambulatory Visit: Payer: Medicare Other | Admitting: Student

## 2021-08-13 DIAGNOSIS — E1165 Type 2 diabetes mellitus with hyperglycemia: Secondary | ICD-10-CM | POA: Diagnosis not present

## 2021-08-17 IMAGING — CT CT CHEST W/O CM
2 of 4 series · 15 of 36 positions shown, 18 images · non-contrast
Comparison: Chest radiograph October 29, 2020

CLINICAL DATA: Chronic productive cough x5 years.

EXAM:
CT CHEST WITHOUT CONTRAST
TECHNIQUE: Multidetector CT imaging of the chest was performed following the
standard protocol without IV contrast.

[Series 2: thorax · axial · 0.85mm/px · z∈[-79,+207]mm · 12 of 169 slices shown, 15 images]
[im 13/169  mediastinal]
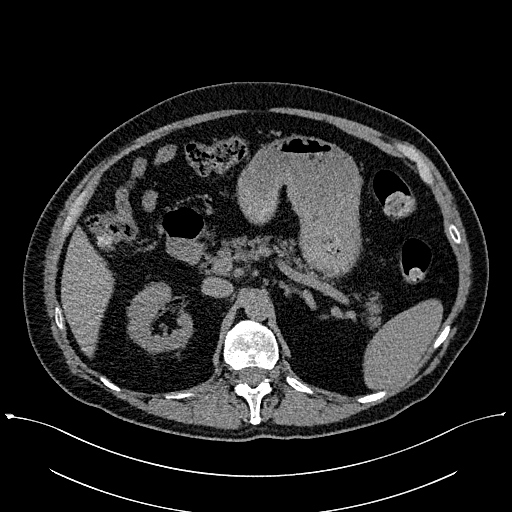
[im 13/169  lung]
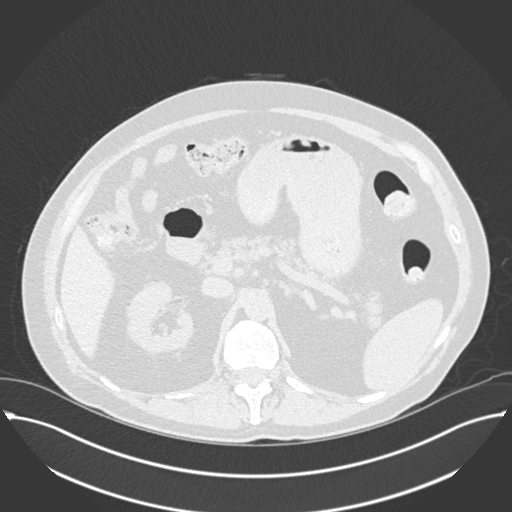
[im 26/169  lung]
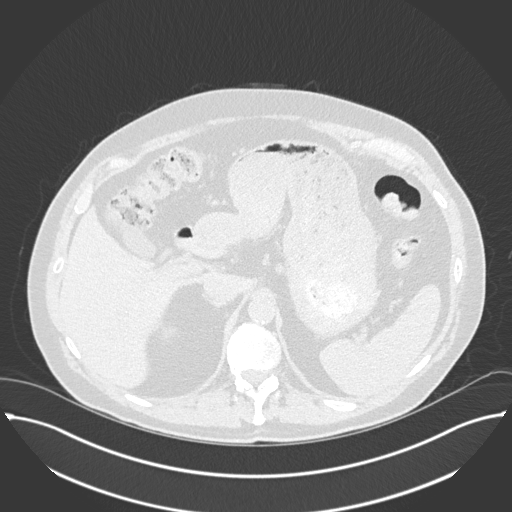
[im 39/169  lung]
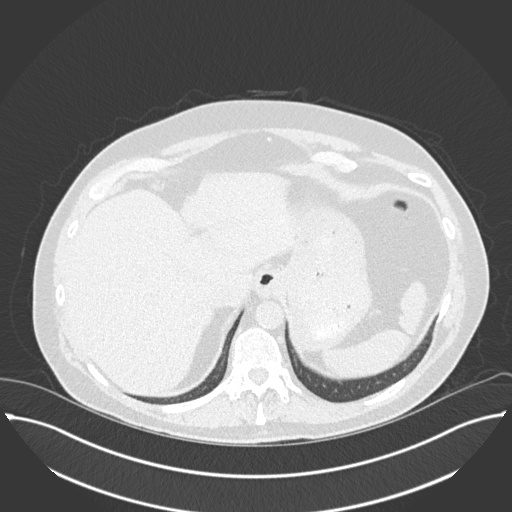
[im 52/169  lung]
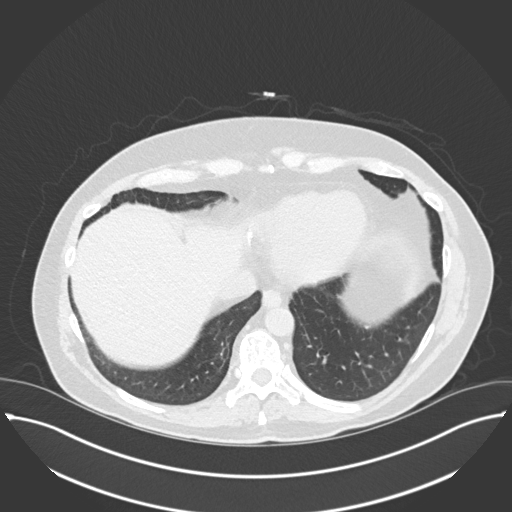
[im 65/169  mediastinal]
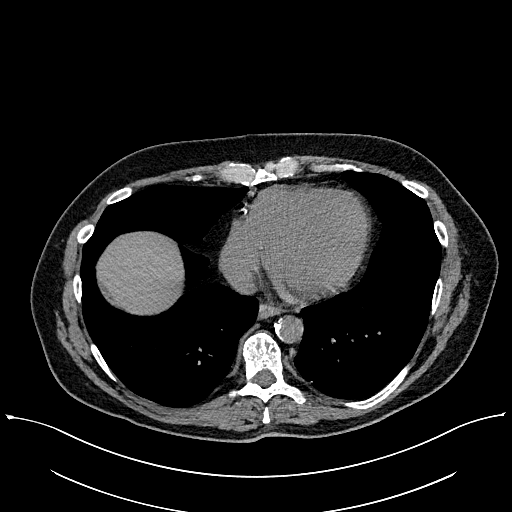
[im 65/169  lung]
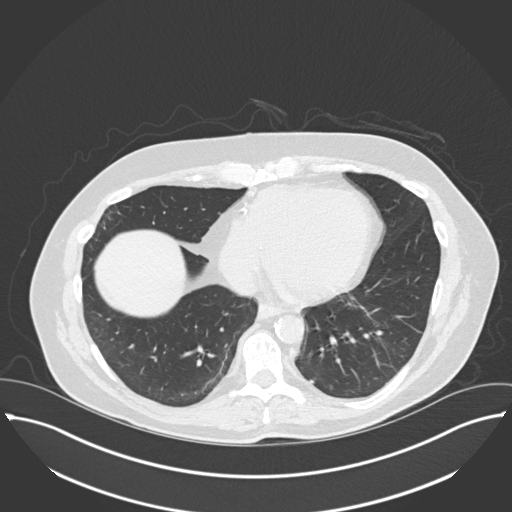
[im 78/169  lung]
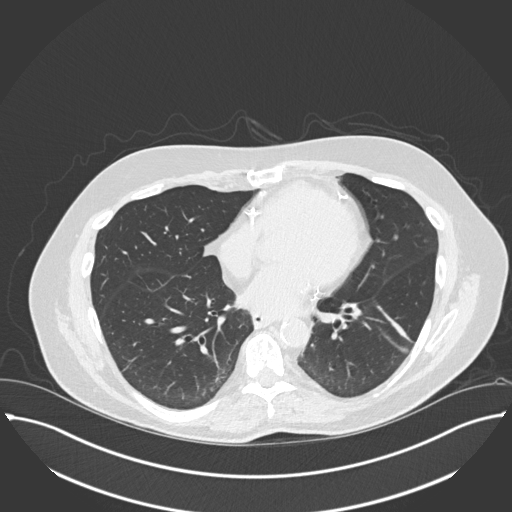
[im 91/169  lung]
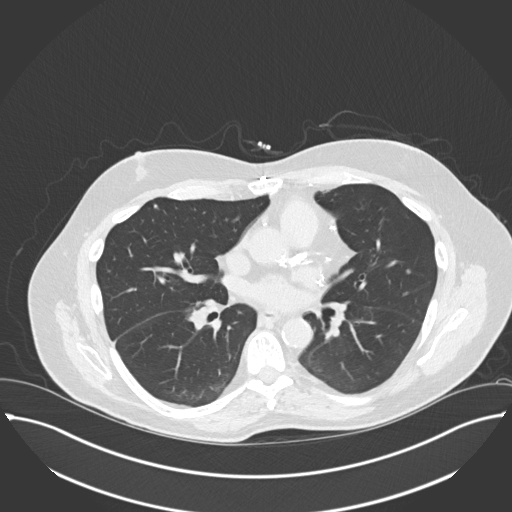
[im 104/169  lung]
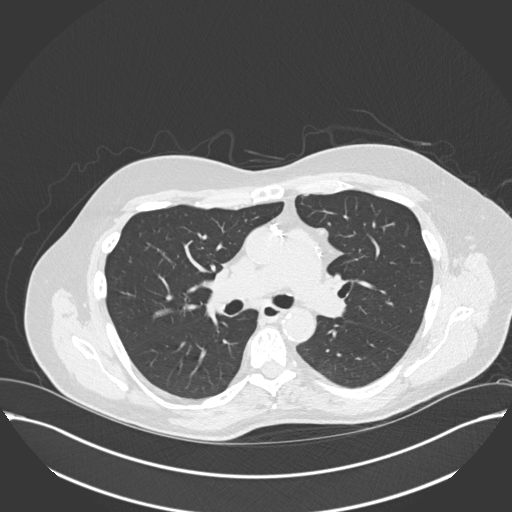
[im 117/169  mediastinal]
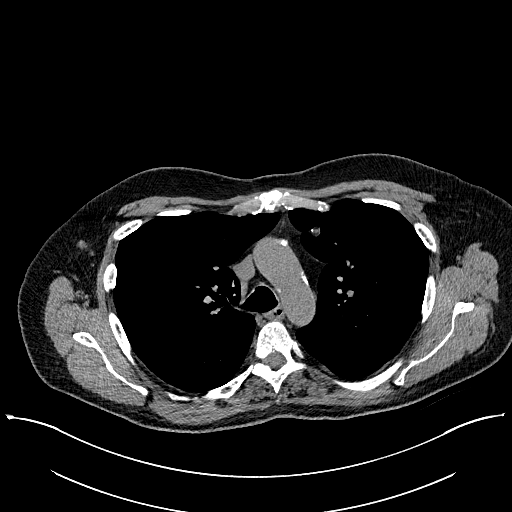
[im 117/169  lung]
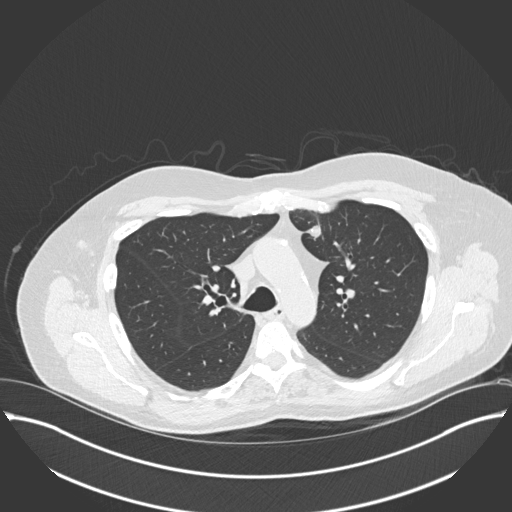
[im 130/169  lung]
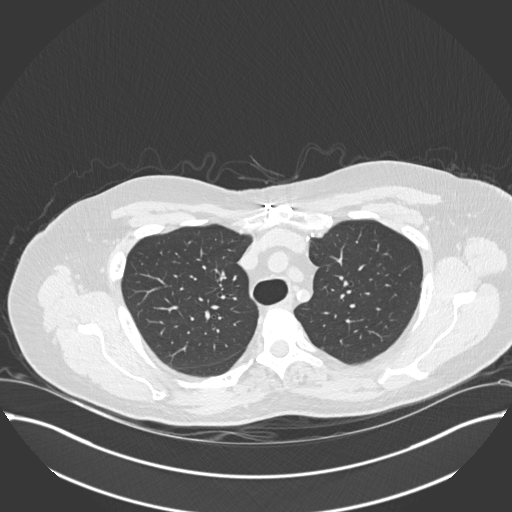
[im 143/169  lung]
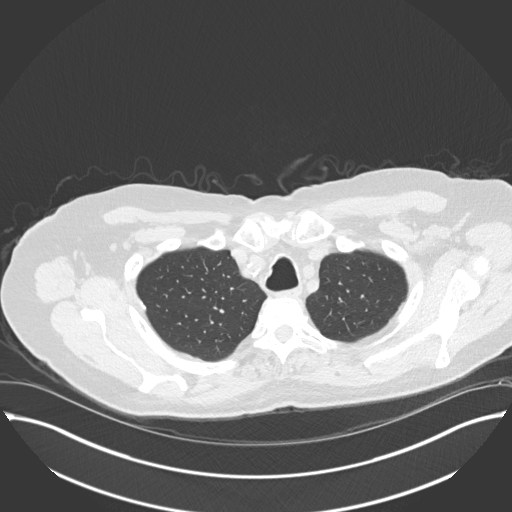
[im 156/169  lung]
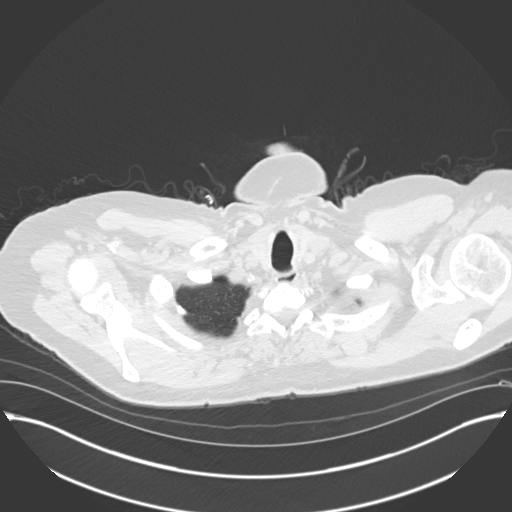

[Series 5: coronal · coronal · 0.66mm/px · 3 of 118 slices shown]
[im 24/118  lung]
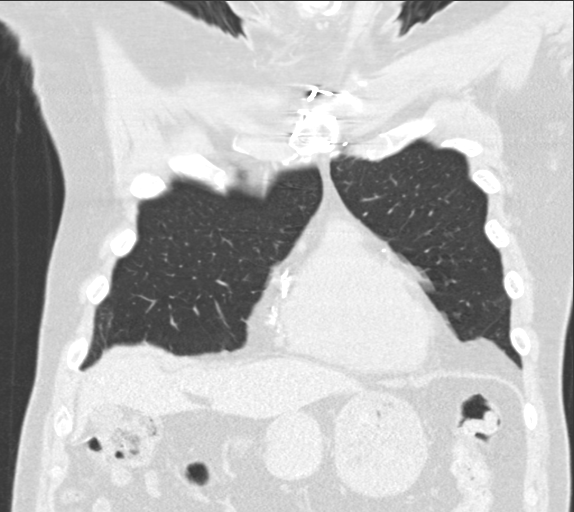
[im 47/118  lung]
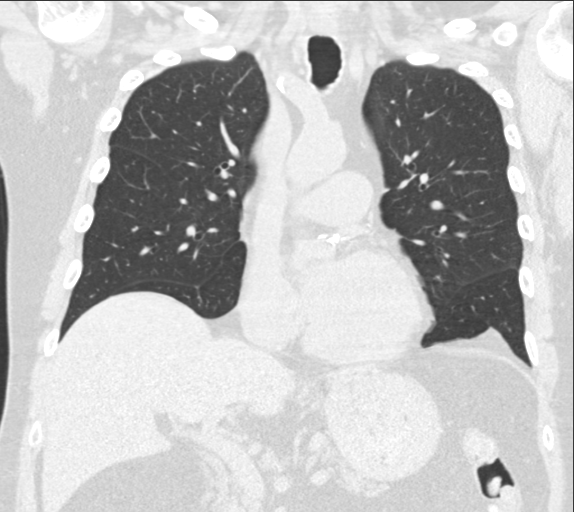
[im 71/118  lung]
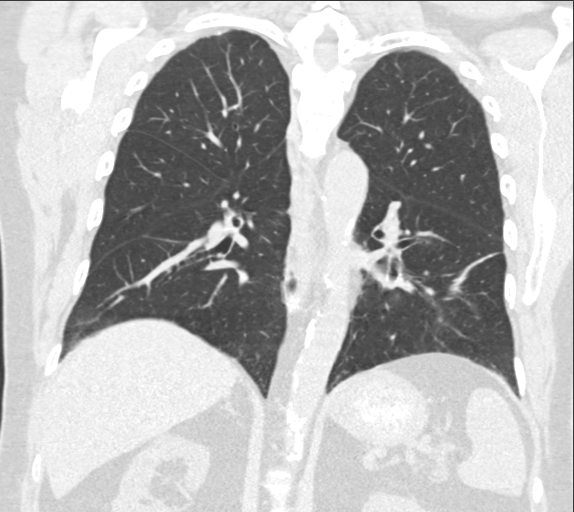

[15 of 36 positions shown; findings below may reference images not displayed]

FINDINGS: Cardiovascular: Changes of prior CABG. Normal size heart. No
pericardial effusion. Aortic atherosclerosis. No thoracic aortic
aneurysm.

Mediastinum/Nodes: Thyroid is unremarkable. Esophagus and trachea
are grossly unremarkable. The no mediastinal, hilar or axillary
lymphadenopathy.

Lungs/Pleura: Mild biapical pleuroparenchymal scarring. Scattered
calcified granulomas. Subsegmental atelectasis in the lung bases. No
suspicious pulmonary nodules or masses. No pleural effusion. No
pneumothorax.

Upper Abdomen: No acute abnormality.

Musculoskeletal: Prior median sternotomy. Mild degenerative changes
spine. No acute osseous abnormality.
IMPRESSION: 1. No acute findings in the chest.
2. Changes of prior CABG.
3. Aortic atherosclerosis.  Aortic Atherosclerosis ([id number]).

## 2021-08-19 ENCOUNTER — Other Ambulatory Visit: Payer: Self-pay

## 2021-08-19 ENCOUNTER — Telehealth: Payer: Self-pay | Admitting: Student

## 2021-08-19 ENCOUNTER — Telehealth: Payer: Self-pay | Admitting: Pulmonary Disease

## 2021-08-19 DIAGNOSIS — I25118 Atherosclerotic heart disease of native coronary artery with other forms of angina pectoris: Secondary | ICD-10-CM

## 2021-08-19 MED ORDER — AZITHROMYCIN 250 MG PO TABS: ORAL_TABLET | ORAL | 0 refills | Status: DC

## 2021-08-19 MED ORDER — PREDNISONE 10 MG PO TABS: ORAL_TABLET | ORAL | 0 refills | Status: DC

## 2021-08-19 NOTE — Telephone Encounter (Signed)
Please call patient back. And find out more information. Please also put in order for BNP and have him go to [facility]

## 2021-08-19 NOTE — Telephone Encounter (Signed)
Spoke with pt and reviewed Jesus Young's recommendations. Pt stated understanding. Reviewed medication instructions with pt who also stated understanding. Medications orders placed. Nothing further needed at this point.

## 2021-08-19 NOTE — Telephone Encounter (Signed)
Patient has some concerns regarding his health; says he has a terrible cough and he would like to talk to someone about it. He's worried due to having heart failure. [phone].

## 2021-08-19 NOTE — Telephone Encounter (Signed)
He should continue Breztri. Please send in [place] and prednisone taper (40mg  x 2 days; 30mg  x 2 days; 20mg  x 2 days; 10mg  x 2 days)

## 2021-08-19 NOTE — Telephone Encounter (Signed)
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 THIS IS SAMPLE --If the inhaler helps, please call our office and we will prescribe Symbicort 27mcg or 160 mg strength for regular use --CONTINUE montelukast 10 mg daily   Chronic systolic heart failure --Responded well to diuretics --Follow-up with Cardiology this week   Allergic Rhinitis --CONTINUE benadryl 25 mg twice daily --CONTINUE flonase (generic: fluticasone). One spray per nare nightly   Follow-up with in one month  Was appointment offered to patient (explain)?  Pt is seeking just advice   Reason for call: Pt states he has a productive cough that started 2 days ago. Pt denies N/V/D/C/F. Pt states he is no longer taking Breztri cause he did not feel like it was helping. Pt states taking OTC [place], but it does not help. Pt also states having a wheeze and is concerned it may be r/t his ongoing cardiac conation. Elizabeth please advise as Dr. Hale Bogus is unavailable.  (examples of things to ask: : When did symptoms start? Fever? Cough? Productive? Color to sputum? More sputum than usual? Wheezing? Have you needed increased oxygen? Are you taking your respiratory medications? What over the counter measures have you tried?)  Allergies  Allergen Reactions   Clonazepam Other (See Comments)    Headache    Lisinopril Other (See Comments)    headache    Immunization History  Administered Date(s) Administered   DTaP 11/25/1994   Influenza, High Dose Seasonal PF 10/02/2014, 08/01/2015, 07/20/2016   Influenza, Quadrivalent, Recombinant, Inj, Pf 07/26/2017, 07/17/2019   Influenza,inj,Quad PF,6-35 Mos  01/23/2020   Influenza-Unspecified 10/11/2011, 12/04/2012, 09/24/2013, 08/15/2018   PFIZER Comirnaty(Gray Top)Covid-19 Tri-Sucrose Vaccine 04/21/2021   PFIZER(Purple Top)SARS-COV-2 Vaccination 01/02/2020, 01/23/2020, 08/11/2020   Pneumococcal Conjugate-13 05/24/2013   Pneumococcal Polysaccharide-23 06/12/2019   Tdap 05/24/2013

## 2021-08-19 NOTE — Telephone Encounter (Signed)
Orders have been placed. Called pt, he stated his Bp is normal. He is concerned because before he coughs he wheezes. He is unsure if this is related to fluid. He does not have any unusual swelling in his feet or legs. Pt has taken a fluid pill but the coughing is continuing, and there is still wheezing. Pt thinks it could be more like a cold. He spoke to his pulmonologist gave him medication. Pt occasionally has SOB but not during these episodes of coughing. He just wanted to you to be aware. Pt agreed to have lab work done.

## 2021-08-21 ENCOUNTER — Emergency Department (HOSPITAL_COMMUNITY): Payer: Medicare Other

## 2021-08-21 ENCOUNTER — Other Ambulatory Visit: Payer: Self-pay

## 2021-08-21 ENCOUNTER — Inpatient Hospital Stay (HOSPITAL_COMMUNITY)
Admission: EM | Admit: 2021-08-21 | Discharge: 2021-08-22 | DRG: 246 | Disposition: A | Discharge status: Home / Self Care | Payer: Medicare Other | Attending: Cardiology | Admitting: Cardiology

## 2021-08-21 ENCOUNTER — Encounter (HOSPITAL_COMMUNITY)
Admission: EM | Disposition: A | Discharge status: Home / Self Care | Payer: Self-pay | Source: Home / Self Care | Attending: Cardiology

## 2021-08-21 DIAGNOSIS — I5023 Acute on chronic systolic (congestive) heart failure: Secondary | ICD-10-CM

## 2021-08-21 DIAGNOSIS — I255 Ischemic cardiomyopathy: Secondary | ICD-10-CM | POA: Diagnosis not present

## 2021-08-21 DIAGNOSIS — G8929 Other chronic pain: Secondary | ICD-10-CM | POA: Diagnosis present

## 2021-08-21 DIAGNOSIS — I25110 Atherosclerotic heart disease of native coronary artery with unstable angina pectoris: Secondary | ICD-10-CM | POA: Diagnosis present

## 2021-08-21 DIAGNOSIS — R778 Other specified abnormalities of plasma proteins: Secondary | ICD-10-CM | POA: Diagnosis not present

## 2021-08-21 DIAGNOSIS — N289 Disorder of kidney and ureter, unspecified: Secondary | ICD-10-CM

## 2021-08-21 DIAGNOSIS — N1832 Chronic kidney disease, stage 3b: Secondary | ICD-10-CM | POA: Diagnosis not present

## 2021-08-21 DIAGNOSIS — K219 Gastro-esophageal reflux disease without esophagitis: Secondary | ICD-10-CM | POA: Diagnosis present

## 2021-08-21 DIAGNOSIS — E1122 Type 2 diabetes mellitus with diabetic chronic kidney disease: Secondary | ICD-10-CM | POA: Diagnosis present

## 2021-08-21 DIAGNOSIS — R231 Pallor: Secondary | ICD-10-CM | POA: Diagnosis not present

## 2021-08-21 DIAGNOSIS — Z794 Long term (current) use of insulin: Secondary | ICD-10-CM | POA: Diagnosis not present

## 2021-08-21 DIAGNOSIS — Z87440 Personal history of urinary (tract) infections: Secondary | ICD-10-CM

## 2021-08-21 DIAGNOSIS — E785 Hyperlipidemia, unspecified: Secondary | ICD-10-CM | POA: Diagnosis present

## 2021-08-21 DIAGNOSIS — Z8249 Family history of ischemic heart disease and other diseases of the circulatory system: Secondary | ICD-10-CM

## 2021-08-21 DIAGNOSIS — Z951 Presence of aortocoronary bypass graft: Secondary | ICD-10-CM | POA: Diagnosis not present

## 2021-08-21 DIAGNOSIS — Z888 Allergy status to other drugs, medicaments and biological substances status: Secondary | ICD-10-CM

## 2021-08-21 DIAGNOSIS — R079 Chest pain, unspecified: Secondary | ICD-10-CM | POA: Diagnosis not present

## 2021-08-21 DIAGNOSIS — Z8349 Family history of other endocrine, nutritional and metabolic diseases: Secondary | ICD-10-CM | POA: Diagnosis not present

## 2021-08-21 DIAGNOSIS — Z87442 Personal history of urinary calculi: Secondary | ICD-10-CM | POA: Diagnosis not present

## 2021-08-21 DIAGNOSIS — Z743 Need for continuous supervision: Secondary | ICD-10-CM | POA: Diagnosis not present

## 2021-08-21 DIAGNOSIS — Z7982 Long term (current) use of aspirin: Secondary | ICD-10-CM | POA: Diagnosis not present

## 2021-08-21 DIAGNOSIS — I447 Left bundle-branch block, unspecified: Secondary | ICD-10-CM

## 2021-08-21 DIAGNOSIS — I252 Old myocardial infarction: Secondary | ICD-10-CM | POA: Diagnosis not present

## 2021-08-21 DIAGNOSIS — I214 Non-ST elevation (NSTEMI) myocardial infarction: Secondary | ICD-10-CM | POA: Diagnosis not present

## 2021-08-21 DIAGNOSIS — Z8616 Personal history of COVID-19: Secondary | ICD-10-CM

## 2021-08-21 DIAGNOSIS — Z7902 Long term (current) use of antithrombotics/antiplatelets: Secondary | ICD-10-CM

## 2021-08-21 DIAGNOSIS — Z79890 Hormone replacement therapy (postmenopausal): Secondary | ICD-10-CM

## 2021-08-21 DIAGNOSIS — Z955 Presence of coronary angioplasty implant and graft: Secondary | ICD-10-CM

## 2021-08-21 DIAGNOSIS — Z20822 Contact with and (suspected) exposure to covid-19: Secondary | ICD-10-CM | POA: Diagnosis present

## 2021-08-21 DIAGNOSIS — I10 Essential (primary) hypertension: Secondary | ICD-10-CM | POA: Diagnosis not present

## 2021-08-21 DIAGNOSIS — Z79899 Other long term (current) drug therapy: Secondary | ICD-10-CM

## 2021-08-21 DIAGNOSIS — R0789 Other chest pain: Secondary | ICD-10-CM | POA: Diagnosis not present

## 2021-08-21 DIAGNOSIS — I499 Cardiac arrhythmia, unspecified: Secondary | ICD-10-CM | POA: Diagnosis not present

## 2021-08-21 DIAGNOSIS — I088 Other rheumatic multiple valve diseases: Secondary | ICD-10-CM | POA: Diagnosis not present

## 2021-08-21 DIAGNOSIS — R7989 Other specified abnormal findings of blood chemistry: Secondary | ICD-10-CM

## 2021-08-21 DIAGNOSIS — I200 Unstable angina: Secondary | ICD-10-CM | POA: Diagnosis not present

## 2021-08-21 DIAGNOSIS — E039 Hypothyroidism, unspecified: Secondary | ICD-10-CM | POA: Diagnosis present

## 2021-08-21 DIAGNOSIS — E782 Mixed hyperlipidemia: Secondary | ICD-10-CM

## 2021-08-21 DIAGNOSIS — R059 Cough, unspecified: Secondary | ICD-10-CM | POA: Diagnosis present

## 2021-08-21 DIAGNOSIS — I130 Hypertensive heart and chronic kidney disease with heart failure and stage 1 through stage 4 chronic kidney disease, or unspecified chronic kidney disease: Secondary | ICD-10-CM | POA: Diagnosis present

## 2021-08-21 HISTORY — PX: LEFT HEART CATH AND CORS/GRAFTS ANGIOGRAPHY: CATH118250

## 2021-08-21 LAB — GLUCOSE, CAPILLARY
Glucose-Capillary: 232 mg/dL — ABNORMAL HIGH (ref 70–99)
Glucose-Capillary: 232 mg/dL — ABNORMAL HIGH (ref 70–99)
Glucose-Capillary: 145 mg/dL — ABNORMAL HIGH (ref 70–99)

## 2021-08-21 LAB — BRAIN NATRIURETIC PEPTIDE: B Natriuretic Peptide: 1283.9 pg/mL — ABNORMAL HIGH (ref 0.0–100.0)

## 2021-08-21 LAB — CBC WITH DIFFERENTIAL/PLATELET
Abs Immature Granulocytes: 0.06 10*3/uL (ref 0.00–0.07)
Basophils Absolute: 0 10*3/uL (ref 0.0–0.1)
Basophils Relative: 0 %
Eosinophils Absolute: 0 10*3/uL (ref 0.0–0.5)
Eosinophils Relative: 0 %
HCT: 48.8 % (ref 39.0–52.0)
Hemoglobin: 16 g/dL (ref 13.0–17.0)
Immature Granulocytes: 1 %
Lymphocytes Relative: 7 %
Lymphs Abs: 0.7 10*3/uL (ref 0.7–4.0)
MCH: 31.9 pg (ref 26.0–34.0)
MCHC: 32.8 g/dL (ref 30.0–36.0)
MCV: 97.4 fL (ref 80.0–100.0)
Monocytes Absolute: 0.8 10*3/uL (ref 0.1–1.0)
Monocytes Relative: 7 %
Neutro Abs: 8.8 10*3/uL — ABNORMAL HIGH (ref 1.7–7.7)
Neutrophils Relative %: 85 %
Platelets: 211 10*3/uL (ref 150–400)
RBC: 5.01 MIL/uL (ref 4.22–5.81)
RDW: 13.2 % (ref 11.5–15.5)
WBC: 10.3 10*3/uL (ref 4.0–10.5)
nRBC: 0 % (ref 0.0–0.2)

## 2021-08-21 LAB — CBG MONITORING, ED: Glucose-Capillary: 250 mg/dL — ABNORMAL HIGH (ref 70–99)

## 2021-08-21 LAB — PROTIME-INR
INR: 1 (ref 0.8–1.2)
Prothrombin Time: 12.7 seconds (ref 11.4–15.2)

## 2021-08-21 LAB — RESP PANEL BY RT-PCR (FLU A&B, COVID) ARPGX2
Influenza A by PCR: NEGATIVE
Influenza B by PCR: NEGATIVE
SARS Coronavirus 2 by RT PCR: NEGATIVE

## 2021-08-21 LAB — HEPARIN LEVEL (UNFRACTIONATED): Heparin Unfractionated: 0.22 IU/mL — ABNORMAL LOW (ref 0.30–0.70)

## 2021-08-21 LAB — BASIC METABOLIC PANEL
Anion gap: 11 (ref 5–15)
BUN: 34 mg/dL — ABNORMAL HIGH (ref 8–23)
CO2: 20 mmol/L — ABNORMAL LOW (ref 22–32)
Calcium: 9.3 mg/dL (ref 8.9–10.3)
Chloride: 102 mmol/L (ref 98–111)
Creatinine, Ser: 2.13 mg/dL — ABNORMAL HIGH (ref 0.61–1.24)
GFR, Estimated: 32 mL/min — ABNORMAL LOW (ref >60)
Glucose, Bld: 199 mg/dL — ABNORMAL HIGH (ref 70–99)
Potassium: 4.5 mmol/L (ref 3.5–5.1)
Sodium: 133 mmol/L — ABNORMAL LOW (ref 135–145)

## 2021-08-21 LAB — HEMOGLOBIN A1C
Hgb A1c MFr Bld: 6.6 % — ABNORMAL HIGH (ref 4.8–5.6)
Mean Plasma Glucose: 142.72 mg/dL

## 2021-08-21 LAB — TROPONIN I (HIGH SENSITIVITY)
Troponin I (High Sensitivity): 30 ng/L — ABNORMAL HIGH (ref <18)
Troponin I (High Sensitivity): 618 ng/L (ref <18)
Troponin I (High Sensitivity): 10144 ng/L (ref <18)

## 2021-08-21 LAB — APTT: aPTT: 29 seconds (ref 24–36)

## 2021-08-21 SURGERY — LEFT HEART CATH AND CORS/GRAFTS ANGIOGRAPHY
Anesthesia: LOCAL

## 2021-08-21 MED ORDER — SODIUM CHLORIDE 0.9 % IV SOLN: INTRAVENOUS | Status: DC

## 2021-08-21 MED ORDER — ONDANSETRON HCL 4 MG/2ML IJ SOLN
4.0000 mg | Freq: Once | INTRAMUSCULAR | Status: AC
  Administered 2021-08-21: 4 mg via INTRAVENOUS

## 2021-08-21 MED ORDER — HEPARIN SODIUM (PORCINE) 1000 UNIT/ML IJ SOLN
INTRAMUSCULAR | Status: AC
  Filled 2021-08-21: qty 1

## 2021-08-21 MED ORDER — INSULIN ASPART 100 UNIT/ML IJ SOLN: 5.0000 [IU] | Freq: Three times a day (TID) | INTRAMUSCULAR | Status: DC

## 2021-08-21 MED ORDER — ASPIRIN EC 81 MG PO TBEC: 81.0000 mg | DELAYED_RELEASE_TABLET | Freq: Every day | ORAL | Status: DC

## 2021-08-21 MED ORDER — SODIUM CHLORIDE 0.9% FLUSH
3.0000 mL | Freq: Two times a day (BID) | INTRAVENOUS | Status: DC
  Administered 2021-08-22: 3 mL via INTRAVENOUS

## 2021-08-21 MED ORDER — SODIUM CHLORIDE 0.9 % IV SOLN: 250.0000 mL | INTRAVENOUS | Status: DC | PRN

## 2021-08-21 MED ORDER — INSULIN DETEMIR 100 UNIT/ML ~~LOC~~ SOLN
16.0000 [IU] | Freq: Two times a day (BID) | SUBCUTANEOUS | Status: DC
  Administered 2021-08-21 – 2021-08-22 (×3): 16 [IU] via SUBCUTANEOUS
  Filled 2021-08-21 (×4): qty 0.16

## 2021-08-21 MED ORDER — FLUTICASONE PROPIONATE 50 MCG/ACT NA SUSP
1.0000 | Freq: Every day | NASAL | Status: DC
  Administered 2021-08-22: 1 via NASAL
  Filled 2021-08-21: qty 16

## 2021-08-21 MED ORDER — AZITHROMYCIN 500 MG PO TABS
250.0000 mg | ORAL_TABLET | Freq: Every day | ORAL | Status: DC
  Administered 2021-08-21 – 2021-08-22 (×2): 250 mg via ORAL
  Filled 2021-08-21 (×2): qty 1

## 2021-08-21 MED ORDER — MIDAZOLAM HCL 2 MG/2ML IJ SOLN
INTRAMUSCULAR | Status: AC
  Filled 2021-08-21: qty 2

## 2021-08-21 MED ORDER — CEFAZOLIN SODIUM-DEXTROSE 1-4 GM/50ML-% IV SOLN
INTRAVENOUS | Status: AC | PRN
  Administered 2021-08-21: 2 g via INTRAVENOUS

## 2021-08-21 MED ORDER — ASPIRIN 81 MG PO CHEW
81.0000 mg | CHEWABLE_TABLET | ORAL | Status: AC
  Administered 2021-08-21: 81 mg via ORAL
  Filled 2021-08-21: qty 1

## 2021-08-21 MED ORDER — INSULIN ASPART 100 UNIT/ML IJ SOLN: 0.0000 [IU] | Freq: Every day | INTRAMUSCULAR | Status: DC

## 2021-08-21 MED ORDER — ROSUVASTATIN CALCIUM 20 MG PO TABS
20.0000 mg | ORAL_TABLET | Freq: Every day | ORAL | Status: DC
  Administered 2021-08-21 – 2021-08-22 (×2): 20 mg via ORAL
  Filled 2021-08-21 (×2): qty 1

## 2021-08-21 MED ORDER — NITROGLYCERIN IN D5W 200-5 MCG/ML-% IV SOLN
0.0000 ug/min | INTRAVENOUS | Status: DC
  Administered 2021-08-21: 5 ug/min via INTRAVENOUS
  Filled 2021-08-21: qty 250

## 2021-08-21 MED ORDER — HEPARIN (PORCINE) IN NACL 1000-0.9 UT/500ML-% IV SOLN
INTRAVENOUS | Status: AC
  Filled 2021-08-21: qty 1000

## 2021-08-21 MED ORDER — METOPROLOL TARTRATE 50 MG PO TABS
50.0000 mg | ORAL_TABLET | Freq: Two times a day (BID) | ORAL | Status: DC
  Administered 2021-08-21 – 2021-08-22 (×2): 50 mg via ORAL
  Filled 2021-08-21 (×2): qty 1

## 2021-08-21 MED ORDER — BUSPIRONE HCL 15 MG PO TABS
30.0000 mg | ORAL_TABLET | Freq: Two times a day (BID) | ORAL | Status: DC
  Administered 2021-08-21 – 2021-08-22 (×2): 30 mg via ORAL
  Filled 2021-08-21 (×2): qty 2

## 2021-08-21 MED ORDER — LIDOCAINE HCL (PF) 1 % IJ SOLN
INTRAMUSCULAR | Status: DC | PRN
  Administered 2021-08-21: 15 mL

## 2021-08-21 MED ORDER — ONDANSETRON HCL 4 MG/2ML IJ SOLN: 4.0000 mg | Freq: Four times a day (QID) | INTRAMUSCULAR | Status: DC | PRN

## 2021-08-21 MED ORDER — FENTANYL CITRATE (PF) 100 MCG/2ML IJ SOLN
INTRAMUSCULAR | Status: DC | PRN
  Administered 2021-08-21: 25 ug via INTRAVENOUS

## 2021-08-21 MED ORDER — HEPARIN (PORCINE) IN NACL 1000-0.9 UT/500ML-% IV SOLN
INTRAVENOUS | Status: DC | PRN
  Administered 2021-08-21 (×2): 500 mL

## 2021-08-21 MED ORDER — SODIUM CHLORIDE 0.9% FLUSH: 3.0000 mL | INTRAVENOUS | Status: DC | PRN

## 2021-08-21 MED ORDER — CLOPIDOGREL BISULFATE 75 MG PO TABS: 75.0000 mg | ORAL_TABLET | Freq: Every day | ORAL | Status: DC

## 2021-08-21 MED ORDER — HEPARIN BOLUS VIA INFUSION
4000.0000 [IU] | Freq: Once | INTRAVENOUS | Status: AC
  Administered 2021-08-21: 4000 [IU] via INTRAVENOUS
  Filled 2021-08-21: qty 4000

## 2021-08-21 MED ORDER — MIDAZOLAM HCL 2 MG/2ML IJ SOLN
INTRAMUSCULAR | Status: DC | PRN
  Administered 2021-08-21: 2 mg via INTRAVENOUS

## 2021-08-21 MED ORDER — TICAGRELOR 90 MG PO TABS
ORAL_TABLET | ORAL | Status: DC | PRN
  Administered 2021-08-21: 180 mg via ORAL

## 2021-08-21 MED ORDER — CEFAZOLIN SODIUM-DEXTROSE 2-4 GM/100ML-% IV SOLN
INTRAVENOUS | Status: AC
  Filled 2021-08-21: qty 100

## 2021-08-21 MED ORDER — HEPARIN (PORCINE) 25000 UT/250ML-% IV SOLN
1200.0000 [IU]/h | INTRAVENOUS | Status: DC
  Administered 2021-08-21: 1000 [IU]/h via INTRAVENOUS
  Filled 2021-08-21: qty 250

## 2021-08-21 MED ORDER — ONDANSETRON HCL 4 MG/2ML IJ SOLN
INTRAMUSCULAR | Status: AC
  Filled 2021-08-21: qty 2

## 2021-08-21 MED ORDER — TEMAZEPAM 7.5 MG PO CAPS
7.5000 mg | ORAL_CAPSULE | Freq: Every evening | ORAL | Status: DC | PRN
  Administered 2021-08-21: 7.5 mg via ORAL
  Filled 2021-08-21: qty 1

## 2021-08-21 MED ORDER — TICAGRELOR 90 MG PO TABS: 90.0000 mg | ORAL_TABLET | Freq: Two times a day (BID) | ORAL | 0 refills | Status: DC

## 2021-08-21 MED ORDER — ACETAMINOPHEN 325 MG PO TABS
650.0000 mg | ORAL_TABLET | ORAL | Status: DC | PRN
  Administered 2021-08-21: 650 mg via ORAL
  Filled 2021-08-21: qty 2

## 2021-08-21 MED ORDER — AMLODIPINE BESYLATE 10 MG PO TABS
10.0000 mg | ORAL_TABLET | Freq: Every day | ORAL | Status: DC
  Administered 2021-08-21 – 2021-08-22 (×2): 10 mg via ORAL
  Filled 2021-08-21 (×2): qty 1

## 2021-08-21 MED ORDER — HEPARIN SODIUM (PORCINE) 1000 UNIT/ML IJ SOLN
INTRAMUSCULAR | Status: DC | PRN
  Administered 2021-08-21: 4000 [IU] via INTRAVENOUS
  Administered 2021-08-21: 7000 [IU] via INTRAVENOUS

## 2021-08-21 MED ORDER — INSULIN ASPART 100 UNIT/ML IJ SOLN
0.0000 [IU] | Freq: Three times a day (TID) | INTRAMUSCULAR | Status: DC
  Administered 2021-08-21: 5 [IU] via SUBCUTANEOUS
  Administered 2021-08-22: 3 [IU] via SUBCUTANEOUS

## 2021-08-21 MED ORDER — SENNOSIDES-DOCUSATE SODIUM 8.6-50 MG PO TABS: 1.0000 | ORAL_TABLET | Freq: Every evening | ORAL | Status: DC | PRN

## 2021-08-21 MED ORDER — LIDOCAINE HCL (PF) 1 % IJ SOLN
INTRAMUSCULAR | Status: AC
  Filled 2021-08-21: qty 30

## 2021-08-21 MED ORDER — RANOLAZINE ER 500 MG PO TB12
500.0000 mg | ORAL_TABLET | Freq: Two times a day (BID) | ORAL | Status: DC
  Administered 2021-08-21 – 2021-08-22 (×2): 500 mg via ORAL
  Filled 2021-08-21 (×2): qty 1

## 2021-08-21 MED ORDER — METOPROLOL TARTRATE 25 MG PO TABS
50.0000 mg | ORAL_TABLET | Freq: Once | ORAL | Status: AC
  Administered 2021-08-21: 50 mg via ORAL
  Filled 2021-08-21: qty 2

## 2021-08-21 MED ORDER — FENTANYL CITRATE (PF) 100 MCG/2ML IJ SOLN
INTRAMUSCULAR | Status: AC
  Filled 2021-08-21: qty 2

## 2021-08-21 MED ORDER — NITROGLYCERIN 0.4 MG SL SUBL: 0.4000 mg | SUBLINGUAL_TABLET | SUBLINGUAL | Status: DC | PRN

## 2021-08-21 MED ORDER — INSULIN ASPART 100 UNIT/ML IJ SOLN
3.0000 [IU] | Freq: Three times a day (TID) | INTRAMUSCULAR | Status: DC
  Administered 2021-08-21 – 2021-08-22 (×3): 3 [IU] via SUBCUTANEOUS

## 2021-08-21 MED ORDER — MONTELUKAST SODIUM 10 MG PO TABS
10.0000 mg | ORAL_TABLET | Freq: Every day | ORAL | Status: DC
  Administered 2021-08-21: 10 mg via ORAL
  Filled 2021-08-21: qty 1

## 2021-08-21 MED ORDER — TICAGRELOR 90 MG PO TABS
90.0000 mg | ORAL_TABLET | Freq: Two times a day (BID) | ORAL | Status: DC
  Administered 2021-08-21 – 2021-08-22 (×2): 90 mg via ORAL
  Filled 2021-08-21 (×2): qty 1

## 2021-08-21 MED ORDER — SODIUM CHLORIDE 0.9 % WEIGHT BASED INFUSION
1.0000 mL/kg/h | INTRAVENOUS | Status: AC
  Administered 2021-08-21: 1 mL/kg/h via INTRAVENOUS

## 2021-08-21 MED ORDER — HEPARIN SODIUM (PORCINE) 5000 UNIT/ML IJ SOLN
5000.0000 [IU] | Freq: Three times a day (TID) | INTRAMUSCULAR | Status: DC
  Administered 2021-08-21 – 2021-08-22 (×2): 5000 [IU] via SUBCUTANEOUS
  Filled 2021-08-21 (×2): qty 1

## 2021-08-21 MED ORDER — IOHEXOL 350 MG/ML SOLN
INTRAVENOUS | Status: DC | PRN
  Administered 2021-08-21: 60 mL via INTRA_ARTERIAL

## 2021-08-21 MED ORDER — TICAGRELOR 90 MG PO TABS
ORAL_TABLET | ORAL | Status: AC
  Filled 2021-08-21: qty 2

## 2021-08-21 SURGICAL SUPPLY — 18 items
CATH INFINITI 5 FR IM (CATHETERS) ×1 IMPLANT
CATH INFINITI 5FR MPB2 (CATHETERS) ×1 IMPLANT
CATH LAUNCHER 6FR AL1 (CATHETERS) IMPLANT
CATHETER LAUNCHER 6FR AL1 (CATHETERS) ×2
CLOSURE PERCLOSE PROSTYLE (VASCULAR PRODUCTS) ×1 IMPLANT
DEVICE SPIDERFX EMB PROT 6MM (WIRE) ×1 IMPLANT
KIT ENCORE 26 ADVANTAGE (KITS) ×1 IMPLANT
KIT HEART LEFT (KITS) ×2 IMPLANT
PACK CARDIAC CATHETERIZATION (CUSTOM PROCEDURE TRAY) ×2 IMPLANT
SHEATH PINNACLE 5F 10CM (SHEATH) ×1 IMPLANT
SHEATH PINNACLE 6F 10CM (SHEATH) ×1 IMPLANT
SHEATH PROBE COVER 6X72 (BAG) ×1 IMPLANT
STENT SYNERGY XD 4.0X16 (Permanent Stent) IMPLANT
SYNERGY XD 4.0X16 (Permanent Stent) ×2 IMPLANT
TRANSDUCER W/STOPCOCK (MISCELLANEOUS) ×2 IMPLANT
TUBING CIL FLEX 10 FLL-RA (TUBING) ×2 IMPLANT
WIRE EMERALD 3MM-J .035X150CM (WIRE) ×1 IMPLANT
WIRE MICRO SET SILHO 5FR 7 (SHEATH) ×1 IMPLANT

## 2021-08-21 NOTE — Progress Notes (Signed)
No further bleeding noted, site soft  to touch ,bruising not getting bigger.pt denied any discomfort.

## 2021-08-21 NOTE — ED Notes (Addendum)
ED Provider, [place] made aware of EKG

## 2021-08-21 NOTE — Progress Notes (Signed)
Admission from the cath. Lab by bed awake and alert. Right groin dressing dry and intact. Instructed to be on bed rest till 840 pm and to notify for any sign of bleeding on the groin site. Continue to monitor.

## 2021-08-21 NOTE — Progress Notes (Signed)
Minimal bleeding , bruising and  some part of the surrounding site hard to touch. Started with manual pressure  on the site, called cath lab. 2 staff came at once and applied more manual pressure for 20 min. Site  soft  to touch  pressure dressing applied by the latter , site marked. MD to be notified by cath lab staff.  Instructed to be flat on bed, for 1 hour and apply pressure when coughing or sneezing. Continue to monitor every 15 min. . Bedrest still  observed.

## 2021-08-21 NOTE — Interval H&P Note (Signed)
History and Physical Interval Note:  08/21/2021 1:52 PM  Jesus Young  has presented today for surgery, with the diagnosis of nstemi.  The various methods of treatment have been discussed with the patient and family. After consideration of risks, benefits and other options for treatment, the patient has consented to  Procedure(s): LEFT HEART CATH AND CORS/GRAFTS ANGIOGRAPHY (N/A) and possible percutaneous coronary intervention as a surgical intervention.  The patient's history has been reviewed, patient examined, no change in status, stable for surgery.  I have reviewed the patient's chart and labs.  Questions were answered to the patient's satisfaction.   Cath Lab Visit (complete for each Cath Lab visit)  Clinical Evaluation Leading to the Procedure:   ACS: Yes.    Non-ACS:    Anginal Classification: CCS IV  Anti-ischemic medical therapy: Maximal Therapy (2 or more classes of medications)  Non-Invasive Test Results: No non-invasive testing performed  Prior CABG: Previous CABG   Adrian Prows

## 2021-08-21 NOTE — ED Notes (Signed)
Breakfast Ordered 

## 2021-08-21 NOTE — ED Triage Notes (Addendum)
Patient BIBEMS from home, CP beginning at 10pm when out putting up hose. Patient states this pain radiated to left arm. Patient states this happens frequently but usually gets better with nitro. Patient cool and clammy when EMS arrived.   Recent dx of CHF, Previous MI  324 Aspirin and home Nitro x3. Worse with movement.  EMS gave 1 additional nitro.   Patient also presenting with cough, PCP prescribed prednisone.   HR: 120's BP: 148/86 SPO2: 98

## 2021-08-21 NOTE — H&P (Signed)
CARDIOLOGY CONSULT NOTE  Patient ID: CLEATUS GABRIEL MRN: [id number] DOB/AGE: [birth year] 75 y.o.  Admit date: 08/21/2021 Primary Physician:  Merrilee Seashore, MD Chief Complaint: chest pain   Patient ID: ZACHARIA SOWLES, male    DOB: May 09, 1946, 75 y.o.   MRN: [id number]  Chief Complaint  Patient presents with   Chest Pain   HPI:    CASSIE HENKELS  is a 75 y.o. with known coronary artery disease s/p CABG in 2012,  left bundle branch block that was noted  2014, hypertension, diabetes, hyperlipidemia and chronic kidney disease. History of Covid 19 infection during March 2022.    Due to complaints of left arm pain with exertion, underwent lexiscan nuclear stress test on 08/07/2018 considered high risk study due to LVEF; however, on echocardiogram had normal LVEF.  No significant changes were noted compared to the 2014, but in view of continued symptoms despite medical management, he underwent coronary angiogram on 12/05/2018 and patient was noted to have small vessel and microvascular disease. Patient again presented with exertional symptoms, therefore underwent repeat echocardiogram and nuclear stress test 11/2020.  Stress test was high risk given low stress LVEF, however this was reviewed with Dr. Einar Gip who felt it was likely an error related to known left bundle branch block as echocardiogram at that time revealed LVEF 45-50%.   Patient presented to our office 06/29/2021 with worsening shortness of breath and orthopnea, repeat echocardiogram revealed LVEF had reduced further to 30-35%.  However up titration of guideline directed medical therapy has been limited due to worsening renal function.   Patient was last seen in our office 07/30/2021 without clinical evidence of heart failure and no recurrence of chest pain.  However he now presents to [facility] emergency department with chest pain and cough.  Work-up in the emergency department revealed serial troponins 30 --> 618, EKG revealed  sinus tachycardia, and normal chest x-ray.  Given chest pain and elevated troponin patient was started on heparin and nitroglycerin drip.  Patient is seen and examined with wife present at bedside this morning.  He states he continues to have mild left-sided chest pain and left arm pain, which has significantly improved since yesterday.  Patient states that last night around 10 PM he was in his yard and exerted himself while watering the plants.  Following this exertion patient began experiencing chest pain worse than typical.  Notably patient does have intermittent chest pain at baseline, however it is typically relieved with rest or sublingual nitroglycerin.  Last night patient took 3 nitroglycerin and pain did not resolve, therefore he called EMS for evaluation in the emergency department.  Patient has also had ongoing productive cough and intermittent wheezing for the last 1 week, recently prescribed Z-Pak and prednisone by pulmonology.  Past Medical History:  Diagnosis Date   Anxiety    Arthritis    back and knees   Bronchitis    hx of 2015   Cancer [facility])    prostate   Cataract    immature on right   Chronic back pain    CKD (chronic kidney disease)    Constipation    takes Colace at bedtime   Coronary artery disease    takes Plavix daily but is on hold for surgery   Depression    Diabetes mellitus without complication ([place])    takes Levemir and Humalog daily   Dizziness    Fall    hx of with injury to left shoulder  GERD (gastroesophageal reflux disease)    takes Protonix daily and Zantac   H/O urinary frequency    Hemorrhoids    History of bladder infections    History of kidney stones    Hyperlipidemia    takes Zocor every evening   Hypertension    takes Metoprolol daily   Hypothyroidism    takes Synthroid daily   Left bundle branch block    Myocardial infarction [facility])    many yrs before 2002   Night muscle spasms    takes Robaxin 4 x day    Nocturia    Past  Surgical History:  Procedure Laterality Date   CARDIAC CATHETERIZATION  05/17/02   COLONOSCOPY     CORONARY ANGIOPLASTY     x 1    CORONARY ARTERY BYPASS GRAFT  2002   x 5   eyelid surgery      INGUINAL HERNIA REPAIR Left 05/06/2020   Procedure: LEFT INGUINAL HERNIA REPAIR;  Surgeon: Donnie Mesa, MD;  Location: [place];  Service: General;  Laterality: Left;   INSERTION OF MESH Left 05/06/2020   Procedure: INSERTION OF MESH;  Surgeon: Donnie Mesa, MD;  Location: [place];  Service: General;  Laterality: Left;   LEFT HEART CATH AND CORS/GRAFTS ANGIOGRAPHY N/A 12/05/2018   Procedure: LEFT HEART CATH AND CORS/GRAFTS ANGIOGRAPHY;  Surgeon: Adrian Prows, MD;  Location: [place] [facility];  Service: Cardiovascular;  Laterality: N/A;   LUMBAR LAMINECTOMY/DECOMPRESSION MICRODISCECTOMY Bilateral 07/18/2013   Procedure: Bilateral Lumbar four-five Lumbar Laminotomy/Microdiskectomy;  Surgeon: Hosie Spangle, MD;  Location: [place] [facility];  Service: Neurosurgery;  Laterality: Bilateral;  Bilateral Lumbar four-five Lumbar Laminotomy/Microdiskectomy   NEPHROLITHOTOMY Left 06/23/2015   Procedure: NEPHROLITHOTOMY PERCUTANEOUS;  Surgeon: [name], MD;  Location: [facility];  Service: Urology;  Laterality: Left;   PROSTATECTOMY  10/2009   skin spots removed from back -precancerous     TONSILLECTOMY     Family History  Problem Relation Age of Onset   Heart disease Father    Heart attack Father    Thyroid disease Sister    Social History   Tobacco Use   Smoking status: Never   Smokeless tobacco: Never  Substance Use Topics   Alcohol use: No    Marital Sttus: Married  ROS  Review of Systems  Constitutional: Negative for malaise/fatigue and weight gain.  Cardiovascular:  Positive for chest pain. Negative for claudication, leg swelling, near-syncope, orthopnea, palpitations, paroxysmal nocturnal dyspnea and syncope.  Respiratory:  Positive for cough. Negative for shortness of breath.   Musculoskeletal:         Left arm pain  Neurological:  Negative for dizziness.  All other systems reviewed and are negative. Objective   Vitals with BMI 08/21/2021 08/21/2021 08/21/2021  Height - - -  Weight - - -  BMI - - -  Systolic 893 810 175  Diastolic 79 79 79  Pulse 85 85 91    Blood pressure 124/79, pulse 85, temperature 97.8 F (36.6 C), temperature source Oral, resp. rate 18, SpO2 96 %.    Physical Exam Vitals reviewed.  Constitutional:      Comments: Abdominal obesity  HENT:     Head: Normocephalic and atraumatic.     Right Ear: External ear normal.     Left Ear: External ear normal.     Nose: Nose normal.  Eyes:     Conjunctiva/sclera: Conjunctivae normal.  Neck:     Vascular: No carotid bruit or JVD.  Cardiovascular:  Rate and Rhythm: Regular rhythm. Tachycardia present.     Pulses: Intact distal pulses.     Heart sounds: S1 normal and S2 normal. No murmur heard.   No gallop.  Pulmonary:     Effort: Pulmonary effort is normal. No respiratory distress.     Breath sounds: No wheezing, rhonchi or rales.  Abdominal:     General: Bowel sounds are normal.     Tenderness: There is no abdominal tenderness.  Musculoskeletal:     Right lower leg: No edema.     Left lower leg: No edema.  Skin:    General: Skin is warm and dry.  Neurological:     Mental Status: He is alert and oriented to person, place, and time.   Laboratory examination:   Recent Labs    07/15/21 1012 07/29/21 1108 08/21/21 0145  NA 138 135 133*  K 4.1 4.6 4.5  CL 101 100 102  CO2 23 20 20*  GLUCOSE 102* 139* 199*  BUN 25 29* 34*  CREATININE 1.92* 1.99* 2.13*  CALCIUM 9.2 9.5 9.3  GFRNONAA  --   --  32*   CrCl cannot be calculated (Unknown ideal weight.).  CMP Latest Ref Rng & Units 08/21/2021 07/29/2021 07/15/2021  Glucose 70 - 99 mg/dL 199(H) 139(H) 102(H)  BUN 8 - 23 mg/dL 34(H) 29(H) 25  Creatinine 0.61 - 1.24 mg/dL 2.13(H) 1.99(H) 1.92(H)  Sodium 135 - 145 mmol/L 133(L) 135 138  Potassium 3.5 -  5.1 mmol/L 4.5 4.6 4.1  Chloride 98 - 111 mmol/L 102 100 101  CO2 22 - 32 mmol/L 20(L) 20 23  Calcium 8.9 - 10.3 mg/dL 9.3 9.5 9.2  Total Protein 6.5 - 8.1 g/dL - - -  Total Bilirubin 0.3 - 1.2 mg/dL - - -  Alkaline Phos 38 - 126 U/L - - -  AST 15 - 41 U/L - - -  ALT 17 - 63 U/L - - -   CBC Latest Ref Rng & Units 08/21/2021 04/30/2020 12/06/2018  WBC 4.0 - 10.5 K/uL 10.3 6.0 -  Hemoglobin 13.0 - 17.0 g/dL 16.0 16.0 14.2  Hematocrit 39.0 - 52.0 % 48.8 51.0 43.7  Platelets 150 - 400 K/uL 211 181 -   Lipid Panel No results for input(s): CHOL, TRIG, LDLCALC, VLDL, HDL, CHOLHDL, LDLDIRECT in the last 8760 hours.  HEMOGLOBIN A1C Lab Results  Component Value Date   HGBA1C 6.9 (H) 04/30/2020   MPG 151 04/30/2020   TSH No results for input(s): TSH in the last 8760 hours. BNP (last 3 results) Recent Labs    06/29/21 1303 07/06/21 1144  BNP 536.1* 384.5*   Results for orders placed or performed during the hospital encounter of 08/21/21 (from the past 48 hour(s))  Resp Panel by RT-PCR (Flu A&B, Covid) Nasopharyngeal Swab     Status: None   Collection Time: 08/21/21  1:20 AM   Specimen: Nasopharyngeal Swab; Nasopharyngeal(NP) swabs in vial transport medium  Result Value Ref Range   SARS Coronavirus 2 by RT PCR NEGATIVE NEGATIVE    Comment: (NOTE) SARS-CoV-2 target nucleic acids are NOT DETECTED.  The SARS-CoV-2 RNA is generally detectable in upper respiratory specimens during the acute phase of infection. The lowest concentration of SARS-CoV-2 viral copies this assay can detect is 138 copies/mL. A negative result does not preclude SARS-Cov-2 infection and should not be used as the sole basis for treatment or other patient management decisions. A negative result may occur with  improper specimen collection/handling, submission of specimen  other than nasopharyngeal swab, presence of viral mutation(s) within the areas targeted by this assay, and inadequate number of viral copies(<138  copies/mL). A negative result must be combined with clinical observations, patient history, and epidemiological information. The expected result is Negative.  Fact Sheet for Patients:  EntrepreneurPulse.com.au  Fact Sheet for Healthcare Providers:  IncredibleEmployment.be  This test is no t yet approved or cleared by the [place] FDA and  has been authorized for detection and/or diagnosis of SARS-CoV-2 by FDA under an Emergency Use Authorization (EUA). This EUA will remain  in effect (meaning this test can be used) for the duration of the COVID-19 declaration under Section 564(b)(1) of the Act, 21 U.S.C.section 360bbb-3(b)(1), unless the authorization is terminated  or revoked sooner.       Influenza A by PCR NEGATIVE NEGATIVE   Influenza B by PCR NEGATIVE NEGATIVE    Comment: (NOTE) The Xpert Xpress SARS-CoV-2/FLU/RSV plus assay is intended as an aid in the diagnosis of influenza from Nasopharyngeal swab specimens and should not be used as a sole basis for treatment. Nasal washings and aspirates are unacceptable for Xpert Xpress SARS-CoV-2/FLU/RSV testing.  Fact Sheet for Patients: EntrepreneurPulse.com.au  Fact Sheet for Healthcare Providers: IncredibleEmployment.be  This test is not yet approved or cleared by the [place] FDA and has been authorized for detection and/or diagnosis of SARS-CoV-2 by FDA under an Emergency Use Authorization (EUA). This EUA will remain in effect (meaning this test can be used) for the duration of the COVID-19 declaration under Section 564(b)(1) of the Act, 21 U.S.C. section 360bbb-3(b)(1), unless the authorization is terminated or revoked.  Performed at [facility], [place] [address], [place], [place] [address]   Basic metabolic panel     Status: Abnormal   Collection Time: 08/21/21  1:45 AM  Result Value Ref Range   Sodium 133 (L) 135 - 145 mmol/L    Potassium 4.5 3.5 - 5.1 mmol/L   Chloride 102 98 - 111 mmol/L   CO2 20 (L) 22 - 32 mmol/L   Glucose, Bld 199 (H) 70 - 99 mg/dL    Comment: Glucose reference range applies only to samples taken after fasting for at least 8 hours.   BUN 34 (H) 8 - 23 mg/dL   Creatinine, Ser 2.13 (H) 0.61 - 1.24 mg/dL   Calcium 9.3 8.9 - 10.3 mg/dL   GFR, Estimated 32 (L) >60 mL/min    Comment: (NOTE) Calculated using the CKD-EPI Creatinine Equation (2021)    Anion gap 11 5 - 15    Comment: Performed at [facility] [address] [place], [place] [address]  CBC with Differential/Platelet     Status: Abnormal   Collection Time: 08/21/21  1:45 AM  Result Value Ref Range   WBC 10.3 4.0 - 10.5 K/uL   RBC 5.01 4.22 - 5.81 MIL/uL   Hemoglobin 16.0 13.0 - 17.0 g/dL   HCT 48.8 39.0 - 52.0 %   MCV 97.4 80.0 - 100.0 fL   MCH 31.9 26.0 - 34.0 pg   MCHC 32.8 30.0 - 36.0 g/dL   RDW 13.2 11.5 - 15.5 %   Platelets 211 150 - 400 K/uL   nRBC 0.0 0.0 - 0.2 %   Neutrophils Relative % 85 %   Neutro Abs 8.8 (H) 1.7 - 7.7 K/uL   Lymphocytes Relative 7 %   Lymphs Abs 0.7 0.7 - 4.0 K/uL   Monocytes Relative 7 %   Monocytes Absolute 0.8 0.1 -  1.0 K/uL   Eosinophils Relative 0 %   Eosinophils Absolute 0.0 0.0 - 0.5 K/uL   Basophils Relative 0 %   Basophils Absolute 0.0 0.0 - 0.1 K/uL   Immature Granulocytes 1 %   Abs Immature Granulocytes 0.06 0.00 - 0.07 K/uL    Comment: Performed at [facility] [address] [place], [place] [address]  APTT     Status: None   Collection Time: 08/21/21  1:45 AM  Result Value Ref Range   aPTT 29 24 - 36 seconds    Comment: Performed at [facility] [address] [place] [address] [place], [place] [address]  Protime-INR     Status: None   Collection Time: 08/21/21  1:45 AM  Result Value Ref Range   Prothrombin Time 12.7 11.4 - 15.2 seconds   INR 1.0 0.8 - 1.2    Comment: (NOTE) INR goal varies based on device and disease states. Performed at [facility],  [place] [address], [place], [place] [address]   Troponin I (High Sensitivity)     Status: Abnormal   Collection Time: 08/21/21  1:45 AM  Result Value Ref Range   Troponin I (High Sensitivity) 30 (H) <18 ng/L    Comment: (NOTE) Elevated high sensitivity troponin I (hsTnI) values and significant  changes across serial measurements may suggest ACS but many other  chronic and acute conditions are known to elevate hsTnI results.  Refer to the "Links" section for chest pain algorithms and additional  guidance. Performed at [facility], [place] [address], [place], [place] [address]   Troponin I (High Sensitivity)     Status: Abnormal   Collection Time: 08/21/21  3:20 AM  Result Value Ref Range   Troponin I (High Sensitivity) 618 (HH) <18 ng/L    Comment: CRITICAL RESULT CALLED TO, READ BACK BY AND VERIFIED WITH: Erasmo Score B,RN 08/21/21 0441 WAYK Performed at [facility], [place] [address], [place], [place] [address]     Medications and allergies   Allergies  Allergen Reactions   Clonazepam Other (See Comments)    Headache    Lisinopril Other (See Comments)    headache     No outpatient medications have been marked as taking for the 08/21/21 encounter [facility] Encounter).    Scheduled Meds: Continuous Infusions:  heparin 1,000 Units/hr (08/21/21 0338)   nitroGLYCERIN 50 mcg/min (08/21/21 0430)   PRN Meds:.   No intake/output data recorded. No intake/output data recorded.    Radiology:  [facility] Chest Port 1 View 08/21/2021 Cardiac shadow is mildly enlarged. Changes of prior coronary bypass grafting are again seen. Lungs are well aerated bilaterally. Mild elevation of the left hemidiaphragm is seen. No focal infiltrate or sizable effusion is noted. No bony abnormality is seen. IMPRESSION: No active disease.   Cardiac Studies:   Coronary angiogram 12/05/2018: Left main diffusely diseased and occluded proximal LAD and circumflex pulmonary artery.  RCA occluded in the proximal  segment.  SVG to RCA is patent, diffusely diseased PL branch with high-grade stenosis of 90 to 99% and PDA has about a 60 to 70% mid stenosis.  Small vessels. LAD is diffusely diseased.  It is occluded in the proximal segment.  Supplied by LIMA to LAD.  Distal to the insertion, there is a 60 to 70% diffuse disease.  Small vessel distally. Free radial graft to OM1 is widely patent.  Distal circumflex has a high-grade lesion, small vessel. SVG to D1 is occluded. Normal LV  systolic function, mild posterior hypokinesis.  Normal LVEDP.  EF estimated at 55%.   PCV MYOCARDIAL PERFUSION WITH LEXISCAN 11/24/2020 Lexiscan nuclear stress test performed using 1-day protocol. Rest and stress EKG showed sinus rhythm, IVCD/tachycardia. Normal myocardial perfusion. Stress LVEF 25%. High risk study due to low stress LVEF. Further discuss with Dr. Einar Gip, suspects that the LVEF is an error, likely secondary to known LBBB.    Echocardiogram 07/08/2021:  Left ventricle cavity is mildly dilated. Mild concentric hypertrophy of the left ventricle. Moderate global hypokinesis. LVEF 30-35%. Abnormal septal wall motion due to left bundle branch block. Indeterminate diastolic filling pattern.  Moderate (Grade II) mitral regurgitation.  Mild tricuspid regurgitation.  Mild pulmonic regurgitation.  No evidence of pulmonary hypertension.  Compared to previous study on 11/18/2020, LVEF appears lower (reported 45-50% then).  EKG: 08/21/2021: Sinus tachycardia at a rate of 118 bpm.  Left bundle branch block, no further analysis. 06/29/2021: Sinus rhythm at a rate of 71 bpm.  Left atrial enlargement.  Left bundle branch block, no further analysis.  Assessment   BRITTANY AMIRAULT  is a 75 y.o. with known coronary artery disease s/p CABG in 5625, chronic systolic heart failure, left bundle branch block that was noted  2014, hypertension, diabetes, hyperlipidemia and chronic kidney disease. History of Covid 19 infection during  March 2022.    NSTEMI, known CAD s/p CABG (6389) Chronic systolic heart failure Left bundle branch block Hypertension Hyperlipidemia CKD Diabetes mellitus  Recommendations:   NSTEMI, known CAD s/p CABG (2012) Continue NTG drip and heparin drip.  Continue home statin therapy. Hold metoprolol given soft blood pressure.  Given chest pain, elevated troponin, and known history of CAD recommend proceeding with cardiac catheterization.   The left heart catheterization procedure was explained to the patient in detail. The indication, alternatives, risks and benefits were reviewed. Complications including but not limited to bleeding, infection, acute kidney injury, blood transfusion, heart rhythm disturbances, contrast (dye) reaction, damage to the arteries or nerves in the legs or hands, cerebrovascular accident, myocardial infarction, need for emergent bypass surgery, blood clots in the legs, possible need for emergent blood transfusion, and rarely death were reviewed and discussed with the patient. The patient voices understanding and wishes to proceed.  Chronic systolic heart failure Echocardiogram 07/08/2021: LVEF 30-35% Patient appears euvolemic on exam. Blood pressure is presently soft given NTG drip. Will therefore hold home amlodipine, Imdur, metoprolol.  Will plan to resume and uptitrate guideline directed medical therapy as hemodynamics and renal function allow.  Presently euvolemic on exam. Will obtain BNP.   Left bundle branch block Patient has known history of left bundle branch block.   Hypertension Blood pressure is presently soft. Will hold home antihypertensive medications.   Hyperlipidemia Continue home statin therapy   CKD Cr mildly elevated at 2.13 compared to 1.99 07/29/21  Continue to monitor BMP   Diabetes mellitus Resume home insulin therapy with sliding scale coverage per protocol.    Patient was seen in collaboration with Dr. Einar Gip. He also reviewed patient's  chart and examined the patient. Dr. Einar Gip is in agreement of the plan.    Alethia Berthold, PA-C 08/21/2021, 8:06 AM Office: [phone]

## 2021-08-21 NOTE — Progress Notes (Signed)
ANTICOAGULATION CONSULT NOTE - Follow Up Consult  Pharmacy Consult for Heparin Indication: chest pain/ACS  Allergies  Allergen Reactions   Clonazepam Other (See Comments)    Headache    Lisinopril Other (See Comments)    headache    Patient Measurements: Height: 6\' 2"  (188 cm) Weight: 87.7 kg (193 lb 5.5 oz) IBW/kg (Calculated) : 82.2 Heparin dosing weight: 87.7 kg  Vital Signs: Temp: 97.8 F (36.6 C) (10/07 0059) Temp Source: Oral (10/07 0059) BP: 130/86 (10/07 1100) Pulse Rate: 91 (10/07 1100)  Labs: Recent Labs    08/21/21 0145 08/21/21 0320 08/21/21 1003  HGB 16.0  --   --   HCT 48.8  --   --   PLT 211  --   --   APTT 29  --   --   LABPROT 12.7  --   --   INR 1.0  --   --   HEPARINUNFRC  --   --  0.22*  CREATININE 2.13*  --   --   TROPONINIHS 30* 618*  --      Estimated Creatinine Clearance: 34.8 mL/min (A) (by C-G formula based on SCr of 2.13 mg/dL (H)).   Medical History: Past Medical History:  Diagnosis Date   Anxiety    Arthritis    back and knees   Bronchitis    hx of 2015   Cancer ([place])    prostate   Cataract    immature on right   Chronic back pain    CKD (chronic kidney disease)    Constipation    takes Colace at bedtime   Coronary artery disease    takes Plavix daily but is on hold for surgery   Depression    Diabetes mellitus without complication (HCC)    takes Levemir and Humalog daily   Dizziness    Fall    hx of with injury to left shoulder    GERD (gastroesophageal reflux disease)    takes Protonix daily and Zantac   H/O urinary frequency    Hemorrhoids    History of bladder infections    History of kidney stones    Hyperlipidemia    takes Zocor every evening   Hypertension    takes Metoprolol daily   Hypothyroidism    takes Synthroid daily   Left bundle branch block    Myocardial infarction [facility])    many yrs before 2002   Night muscle spasms    takes Robaxin 4 x day    Nocturia     Medications:  No current  facility-administered medications on file prior to encounter.   Current Outpatient Medications on File Prior to Encounter  Medication Sig Dispense Refill   acetaminophen (TYLENOL) 650 MG CR tablet Take 1,300 mg by mouth every 8 (eight) hours as needed for pain. PRN     amLODipine (NORVASC) 5 MG tablet Take 2 tablets (10 mg total) by mouth daily. 180 tablet 2   aspirin EC 81 MG tablet Take 81 mg by mouth at bedtime.      azithromycin (ZITHROMAX Z-PAK) 250 MG tablet Take 2 tabs today, then 1 tab until gone (Patient taking differently: Take 250 mg by mouth daily. Take 2 tablets today, then 1 tablet until finished.) 6 each 0   Budeson-Glycopyrrol-Formoterol (BREZTRI AEROSPHERE) 160-9-4.8 MCG/ACT AERO Inhale 2 puffs into the lungs in the morning and at bedtime. 1 g 0   busPIRone (BUSPAR) 15 MG tablet Take 30 mg by mouth 2 (two) times daily.  clopidogrel (PLAVIX) 75 MG tablet Take 75 mg by mouth at bedtime.     dapagliflozin propanediol (FARXIGA) 10 MG TABS tablet Take 1 tablet (10 mg total) by mouth daily before breakfast. 30 tablet 3   docusate sodium (COLACE) 100 MG capsule Take 400 mg by mouth at bedtime.     famotidine (PEPCID) 40 MG tablet Take 40 mg by mouth daily.     fluticasone (FLONASE) 50 MCG/ACT nasal spray Place 1 spray into both nostrils daily.     furosemide (LASIX) 40 MG tablet Take 1 tablet (40 mg total) by mouth daily as needed for fluid or edema. 30 tablet 3   insulin detemir (LEVEMIR) 100 UNIT/ML injection Inject 0.16 mLs (16 Units total) into the skin 2 (two) times daily. (Patient taking differently: Inject 16-18 Units into the skin 2 (two) times daily.) 10 mL 1   insulin lispro (HUMALOG) 100 UNIT/ML injection Inject 0.05-0.09 mLs (5-9 Units total) into the skin 2 (two) times daily. 5 units at lunch, and 9 units after dinner. (Patient taking differently: No sig reported) 10 mL 1   isosorbide mononitrate (IMDUR) 120 MG 24 hr tablet TAKE 1 TABLET(120 MG) BY MOUTH DAILY (Patient  taking differently: Take 120 mg by mouth daily.) 90 tablet 3   levothyroxine (SYNTHROID, LEVOTHROID) 100 MCG tablet Take 100 mcg by mouth See admin instructions. Take 1 tablet (100 mcg) by mouth daily in the morning, except on Sundays     metoprolol (LOPRESSOR) 50 MG tablet Take 50 mg by mouth 2 (two) times daily.      montelukast (SINGULAIR) 10 MG tablet Take 1 tablet (10 mg total) by mouth at bedtime. 30 tablet 11   nitroGLYCERIN (NITROSTAT) 0.4 MG SL tablet Place 1 tablet (0.4 mg total) under the tongue every 5 (five) minutes as needed for chest pain. 15 tablet 3   pantoprazole (PROTONIX) 40 MG tablet Take 40 mg by mouth at bedtime.      predniSONE (DELTASONE) 10 MG tablet Take 4 tablets (40 mg total) by mouth daily with breakfast for 2 days, THEN 3 tablets (30 mg total) daily with breakfast for 2 days, THEN 2 tablets (20 mg total) daily with breakfast for 2 days, THEN 1 tablet (10 mg total) daily with breakfast for 2 days. 20 tablet 0   ranolazine (RANEXA) 500 MG 12 hr tablet Take 2 tablets by mouth twice daily (Patient taking differently: Take 500 mg by mouth 2 (two) times daily.) 180 tablet 1   rosuvastatin (CRESTOR) 20 MG tablet TAKE 1 TABLET BY MOUTH EVERY DAY. DISCONTINUE SIMVASTATIN (Patient taking differently: Take 20 mg by mouth daily.) 90 tablet 3   B-D ULTRAFINE III SHORT PEN 31G X 8 MM MISC USE UTD  0   Budeson-Glycopyrrol-Formoterol (BREZTRI AEROSPHERE) 160-9-4.8 MCG/ACT AERO Inhale 2 puffs into the lungs in the morning and at bedtime. (Patient not taking: Reported on 08/21/2021) 5.9 g 0   ONE TOUCH ULTRA TEST test strip       Assessment: 75 y.o. male with chest pain for heparin. Cards consulted. No AC PTA  1st HL 0.22, subtherapeutic  Goal of Therapy: Heparin level 0.3-0.7 units/ml Monitor platelets by anticoagulation protocol: Yes   Plan: Increase heparin infusion to 1200 units/hr (~2.2u/kg/hr increase) Check heparin level in 6 hour Monitor daily HL and CBC  Joetta Manners,  PharmD, [facility] Emergency Medicine Clinical Pharmacist ED RPh Phone: [place]: [phone]

## 2021-08-21 NOTE — ED Notes (Signed)
MD Wickline at bedside. 

## 2021-08-21 NOTE — ED Provider Notes (Signed)
[facility] EMERGENCY DEPARTMENT Provider Note   CSN: [id number] Arrival date & time: 08/21/21  0048     History Chief Complaint  Patient presents with   Chest Pain    Jesus Young is a 75 y.o. male.  The history is provided by the patient.  Chest Pain Pain location:  L chest Pain quality: aching   Pain severity:  Moderate Onset quality:  Gradual Timing:  Constant Progression:  Worsening Chronicity:  New Relieved by:  Aspirin and nitroglycerin Worsened by:  Exertion Associated symptoms: cough and diaphoresis   Associated symptoms: no abdominal pain, no back pain, no fever, no lower extremity edema, no shortness of breath, no syncope, no vomiting and no weakness   Risk factors: coronary artery disease   Patient with known history of CAD presents for chest pain and left arm pain.  Patient reports he was outside and he was "moving fast" and began having dull/aching left-sided chest pain.  He also reports pain in the left arm which he has had previously with angina.  He also reported he felt "clammy " After about 3 nitroglycerin his pain started to improve.  He reports mild cough that he has had chronically, no new shortness of breath.  No pleuritic pain is reported.  No ripping or tearing sensation is reported    Past Medical History:  Diagnosis Date   Anxiety    Arthritis    back and knees   Bronchitis    hx of 2015   Cancer ([place])    prostate   Cataract    immature on right   Chronic back pain    CKD (chronic kidney disease)    Constipation    takes Colace at bedtime   Coronary artery disease    takes Plavix daily but is on hold for surgery   Depression    Diabetes mellitus without complication (HCC)    takes Levemir and Humalog daily   Dizziness    Fall    hx of with injury to left shoulder    GERD (gastroesophageal reflux disease)    takes Protonix daily and Zantac   H/O urinary frequency    Hemorrhoids    History of bladder  infections    History of kidney stones    Hyperlipidemia    takes Zocor every evening   Hypertension    takes Metoprolol daily   Hypothyroidism    takes Synthroid daily   Left bundle branch block    Myocardial infarction ([place])    many yrs before 2002   Night muscle spasms    takes Robaxin 4 x day    Nocturia     Patient Active Problem List   Diagnosis Date Noted   Unstable angina ([place]) 08/21/2021   Left inguinal hernia 05/06/2020   Tinea cruris 01/13/2019   Left bundle branch block 01/10/2019   History of coronary artery bypass graft 01/10/2019   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   Essential hypertension 12/06/2018   CAD (coronary artery disease) 12/05/2018   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[place]) 12/04/2018   Nasal congestion 03/16/2016   Non-seasonal allergic rhinitis 03/16/2016   Renal calculus 06/23/2015   Renal calculus, left     Past Surgical History:  Procedure Laterality Date   CARDIAC CATHETERIZATION  05/17/02   COLONOSCOPY     CORONARY ANGIOPLASTY     x 1    CORONARY ARTERY BYPASS GRAFT  2002   x  5   eyelid surgery      INGUINAL HERNIA REPAIR Left 05/06/2020   Procedure: LEFT INGUINAL HERNIA REPAIR;  Surgeon: Donnie Mesa, MD;  Location: [facility];  Service: General;  Laterality: Left;   INSERTION OF MESH Left 05/06/2020   Procedure: INSERTION OF MESH;  Surgeon: Donnie Mesa, MD;  Location: [place];  Service: General;  Laterality: Left;   LEFT HEART CATH AND CORS/GRAFTS ANGIOGRAPHY N/A 12/05/2018   Procedure: LEFT HEART CATH AND CORS/GRAFTS ANGIOGRAPHY;  Surgeon: Adrian Prows, MD;  Location: [place] CV LAB;  Service: Cardiovascular;  Laterality: N/A;   LUMBAR LAMINECTOMY/DECOMPRESSION MICRODISCECTOMY Bilateral 07/18/2013   Procedure: Bilateral Lumbar four-five Lumbar Laminotomy/Microdiskectomy;  Surgeon: Hosie Spangle, MD;  Location: [place] NEURO ORS;  Service: Neurosurgery;  Laterality: Bilateral;  Bilateral Lumbar four-five Lumbar  Laminotomy/Microdiskectomy   NEPHROLITHOTOMY Left 06/23/2015   Procedure: NEPHROLITHOTOMY PERCUTANEOUS;  Surgeon: [name], MD;  Location: [facility] OR[facility];  Service: Urology;  Laterality: Left;   PROSTATECTOMY  10/2009   skin spots removed from back -precancerous     TONSILLECTOMY         Family History  Problem Relation Age of Onset   Heart disease Father    Heart attack Father    Thyroid disease Sister     Social History   Tobacco Use   Smoking status: Never   Smokeless tobacco: Never  Vaping Use   Vaping Use: Never used  Substance Use Topics   Alcohol use: No   Drug use: No    Home Medications Prior to Admission medications   Medication Sig Start Date End Date Taking? Authorizing Provider  acetaminophen (TYLENOL) 650 MG CR tablet Take 1,300 mg by mouth every 8 (eight) hours as needed for pain. PRN    [provider]  amLODipine (NORVASC) 5 MG tablet Take 2 tablets (10 mg total) by mouth daily. 12/30/20   Cantwell, Celeste C, PA-C  aspirin EC 81 MG tablet Take 81 mg by mouth at bedtime.     [provider]  azithromycin (ZITHROMAX Z-PAK) 250 MG tablet Take 2 tabs today, then 1 tab until gone 08/19/21   Martyn Ehrich, NP  B-D ULTRAFINE III SHORT PEN 31G X 8 MM MISC USE UTD 01/18/17   [provider]  Budeson-Glycopyrrol-Formoterol (BREZTRI AEROSPHERE) 160-9-4.8 MCG/ACT AERO Inhale 2 puffs into the lungs in the morning and at bedtime. 06/25/21   Margaretha Seeds, MD  Budeson-Glycopyrrol-Formoterol (BREZTRI AEROSPHERE) 160-9-4.8 MCG/ACT AERO Inhale 2 puffs into the lungs in the morning and at bedtime. 07/27/21   Margaretha Seeds, MD  busPIRone (BUSPAR) 15 MG tablet Take 30 mg by mouth 2 (two) times daily. 07/23/20   [provider]  clopidogrel (PLAVIX) 75 MG tablet Take 75 mg by mouth at bedtime. 05/26/15   [provider]  dapagliflozin propanediol (FARXIGA) 10 MG TABS tablet Take 1 tablet (10 mg total) by mouth daily before breakfast.  07/16/21   Cantwell, Celeste C, PA-C  docusate sodium (COLACE) 100 MG capsule Take 400 mg by mouth at bedtime.    [provider]  famotidine (PEPCID) 40 MG tablet Take 40 mg by mouth daily. 05/06/20   [provider]  fluticasone (FLONASE) 50 MCG/ACT nasal spray Place 1 spray into both nostrils daily.    [provider]  furosemide (LASIX) 40 MG tablet Take 1 tablet (40 mg total) by mouth daily as needed for fluid or edema. Patient not taking: Reported on 07/30/2021 07/16/21 11/13/21  Alethia Berthold,  PA-C  insulin detemir (LEVEMIR) 100 UNIT/ML injection Inject 0.16 mLs (16 Units total) into the skin 2 (two) times daily. Patient taking differently: Inject 18 Units into the skin 2 (two) times daily. 18 units in am, 22units at hs 10/30/15   Ward, Kristen N, DO  insulin lispro (HUMALOG) 100 UNIT/ML injection Inject 0.05-0.09 mLs (5-9 Units total) into the skin 2 (two) times daily. 5 units at lunch, and 9 units after dinner. Patient taking differently: Inject 5-15 Units into the skin 2 (two) times daily. 5-15 units as needed  (sliding scale insulin) 10/30/15   Ward, Delice Bison, DO  isosorbide mononitrate (IMDUR) 120 MG 24 hr tablet TAKE 1 TABLET(120 MG) BY MOUTH DAILY 02/16/21   Adrian Prows, MD  levothyroxine (SYNTHROID, LEVOTHROID) 100 MCG tablet Take 100 mcg by mouth See admin instructions. Take 1 tablet (100 mcg) by mouth daily in the morning, except on Sundays 11/16/16   [provider]  metoprolol (LOPRESSOR) 50 MG tablet Take 50 mg by mouth 2 (two) times daily.     [provider]  montelukast (SINGULAIR) 10 MG tablet Take 1 tablet (10 mg total) by mouth at bedtime. 01/14/21   Margaretha Seeds, MD  nitroGLYCERIN (NITROSTAT) 0.4 MG SL tablet Place 1 tablet (0.4 mg total) under the tongue every 5 (five) minutes as needed for chest pain. 11/12/20   Cantwell, Celeste C, PA-C  ONE TOUCH ULTRA TEST test strip  01/21/17   [provider]  pantoprazole (PROTONIX)  40 MG tablet Take 40 mg by mouth at bedtime.  11/16/16   [provider]  predniSONE (DELTASONE) 10 MG tablet Take 4 tablets (40 mg total) by mouth daily with breakfast for 2 days, THEN 3 tablets (30 mg total) daily with breakfast for 2 days, THEN 2 tablets (20 mg total) daily with breakfast for 2 days, THEN 1 tablet (10 mg total) daily with breakfast for 2 days. 08/19/21 08/27/21  Martyn Ehrich, NP  ranolazine (RANEXA) 500 MG 12 hr tablet Take 2 tablets by mouth twice daily 08/05/21   Cantwell, Celeste C, PA-C  rosuvastatin (CRESTOR) 20 MG tablet TAKE 1 TABLET BY MOUTH EVERY DAY. DISCONTINUE SIMVASTATIN 12/24/20   Cantwell, Celeste C, PA-C    Allergies    Clonazepam and Lisinopril  Review of Systems   Review of Systems  Constitutional:  Positive for diaphoresis. Negative for fever.  Respiratory:  Positive for cough. Negative for shortness of breath.   Cardiovascular:  Positive for chest pain. Negative for syncope.  Gastrointestinal:  Negative for abdominal pain, blood in stool and vomiting.  Musculoskeletal:  Negative for back pain.  Neurological:  Negative for weakness.  All other systems reviewed and are negative.  Physical Exam Updated Vital Signs BP (!) 134/93   Pulse (!) 108   Temp 97.8 F (36.6 C) (Oral)   Resp 15   SpO2 97%   Physical Exam CONSTITUTIONAL: Elderly, no acute distress HEAD: Normocephalic/atraumatic EYES: EOMI/PERRL ENMT: Mucous membranes moist NECK: supple no meningeal signs SPINE/BACK:entire spine nontender CV: S1/S2 noted, tach LUNGS: Lungs are clear to auscultation bilaterally, no apparent distress ABDOMEN: soft, nontender, no rebound or guarding, bowel sounds noted throughout abdomen GU:no cva tenderness NEURO: Pt is awake/alert/appropriate, moves all extremitiesx4.  No facial droop.   EXTREMITIES: pulses normal/equalx4, full ROM SKIN: warm, color normal PSYCH: no abnormalities of mood noted, alert and oriented to situation  ED Results /  Procedures / Treatments   Labs (all labs ordered are listed, but only abnormal  results are displayed) Labs Reviewed  BASIC METABOLIC PANEL - Abnormal; Notable for the following components:      Result Value   Sodium 133 (*)    CO2 20 (*)    Glucose, Bld 199 (*)    BUN 34 (*)    Creatinine, Ser 2.13 (*)    GFR, Estimated 32 (*)    All other components within normal limits  CBC WITH DIFFERENTIAL/PLATELET - Abnormal; Notable for the following components:   Neutro Abs 8.8 (*)    All other components within normal limits  TROPONIN I (HIGH SENSITIVITY) - Abnormal; Notable for the following components:   Troponin I (High Sensitivity) 30 (*)    All other components within normal limits  RESP PANEL BY RT-PCR (FLU A&B, COVID) ARPGX2  APTT  PROTIME-INR  HEPARIN LEVEL (UNFRACTIONATED)  TROPONIN I (HIGH SENSITIVITY)    EKG EKG Interpretation  Date/Time:  Friday August 21 2021 00:58:51 EDT Ventricular Rate:  118 PR Interval:  137 QRS Duration: 174 QT Interval:  445 QTC Calculation: 624 R Axis:   -89 Text Interpretation: Sinus tachycardia Right atrial enlargement Left bundle branch block LBBB present 2016 but ST changes more pronounced now compared to then Confirmed by Merrily Pew [id number]) on 08/21/2021 1:03:00 AM  Radiology DG Chest Port 1 View  Result Date: 08/21/2021 CLINICAL DATA:  Chest pain for several hours, initial encounter EXAM: PORTABLE CHEST 1 VIEW COMPARISON:  10/29/2020, CT from 01/09/2021 FINDINGS: Cardiac shadow is mildly enlarged. Changes of prior coronary bypass grafting are again seen. Lungs are well aerated bilaterally. Mild elevation of the left hemidiaphragm is seen. No focal infiltrate or sizable effusion is noted. No bony abnormality is seen. IMPRESSION: No active disease. Electronically Signed   By: Inez Catalina M.D.   On: 08/21/2021 01:44    Procedures .Critical Care Performed by: Ripley Fraise, MD Authorized by: Ripley Fraise, MD   Critical care  provider statement:    Critical care time (minutes):  40   Critical care start time:  08/21/2021 2:30 AM   Critical care end time:  08/21/2021 3:10 AM   Critical care time was exclusive of:  Separately billable procedures and treating other patients   Critical care was necessary to treat or prevent imminent or life-threatening deterioration of the following conditions:  Cardiac failure   Critical care was time spent personally by me on the following activities:  Re-evaluation of patient's condition, pulse oximetry, ordering and review of radiographic studies, ordering and review of laboratory studies, ordering and performing treatments and interventions, discussions with consultants, development of treatment plan with patient or surrogate, evaluation of patient's response to treatment, obtaining history from patient or surrogate and review of old charts   I assumed direction of critical care for this patient from another provider in my specialty: no     Care discussed with: admitting provider     Medications Ordered in ED Medications  nitroGLYCERIN 50 mg in dextrose 5 % 250 mL (0.2 mg/mL) infusion (40 mcg/min Intravenous Rate/Dose Change 08/21/21 0308)  metoprolol tartrate (LOPRESSOR) tablet 50 mg (has no administration in time range)  heparin bolus via infusion 4,000 Units (has no administration in time range)  heparin ADULT infusion 100 units/mL (25000 units/284mL) (has no administration in time range)  ondansetron (ZOFRAN) injection 4 mg (4 mg Intravenous Given 08/21/21 0154)    ED Course  I have reviewed the triage vital signs and the nursing notes.  Pertinent labs & imaging results that were available during my  care of the patient were reviewed by me and considered in my medical decision making (see chart for details).    MDM Rules/Calculators/A&P                           Patient with known history of CAD presents with chest pain.  He reports this episode occurred after he was exerting  himself.  He reports he has not had angina recently and this is the worst episode he can recall having.  Patient has known history of left bundle branch block.  On his EKG reveals sinus tachycardia with an LBBB Patient denies any pleuritic or tearing type pain.  He has had this pain previously but the most severe he is ever had.  Patient is tachycardic, but he is known to be on a beta-blocker and will start this. I have low suspicion for pulmonary embolism/dissection  Discussed the case with Dr. Terri Skains  on-call for his cardiologist Discussed the patient is currently on a nitroglycerin drip.  We will continue to escalate the nitro drip as needed for his pain and as  much as his blood pressure will tolerate.  Dr. Terri Skains  requests  starting heparin and he will admit the patient  No acute findings were noted on my evaluation of chest x-ray.  Labs reveal mildly elevated troponin in setting of unstable angina, as well as mildly worsening renal insufficiency. Final Clinical Impression(s) / ED Diagnoses Final diagnoses:  Unstable angina [facility])  Renal insufficiency    Rx / DC Orders ED Discharge Orders     None        Ripley Fraise, MD 08/21/21 [id number]

## 2021-08-21 NOTE — Progress Notes (Signed)
ANTICOAGULATION CONSULT NOTE - Initial Consult  Pharmacy Consult for Heparin Indication: chest pain/ACS  Allergies  Allergen Reactions   Clonazepam Other (See Comments)    Headache    Lisinopril Other (See Comments)    headache    Patient Measurements:    Vital Signs: Temp: 97.8 F (36.6 C) (10/07 0059) Temp Source: Oral (10/07 0059) BP: 134/93 (10/07 0230) Pulse Rate: 108 (10/07 0230)  Labs: Recent Labs    08/21/21 0145  HGB 16.0  HCT 48.8  PLT 211  APTT 29  LABPROT 12.7  INR 1.0  CREATININE 2.13*  TROPONINIHS 30*    CrCl cannot be calculated (Unknown ideal weight.).   Medical History: Past Medical History:  Diagnosis Date   Anxiety    Arthritis    back and knees   Bronchitis    hx of 2015   Cancer ([place])    prostate   Cataract    immature on right   Chronic back pain    CKD (chronic kidney disease)    Constipation    takes Colace at bedtime   Coronary artery disease    takes Plavix daily but is on hold for surgery   Depression    Diabetes mellitus without complication (HCC)    takes Levemir and Humalog daily   Dizziness    Fall    hx of with injury to left shoulder    GERD (gastroesophageal reflux disease)    takes Protonix daily and Zantac   H/O urinary frequency    Hemorrhoids    History of bladder infections    History of kidney stones    Hyperlipidemia    takes Zocor every evening   Hypertension    takes Metoprolol daily   Hypothyroidism    takes Synthroid daily   Left bundle branch block    Myocardial infarction [facility])    many yrs before 2002   Night muscle spasms    takes Robaxin 4 x day    Nocturia     Medications:  No current facility-administered medications on file prior to encounter.   Current Outpatient Medications on File Prior to Encounter  Medication Sig Dispense Refill   acetaminophen (TYLENOL) 650 MG CR tablet Take 1,300 mg by mouth every 8 (eight) hours as needed for pain. PRN     amLODipine (NORVASC) 5 MG  tablet Take 2 tablets (10 mg total) by mouth daily. 180 tablet 2   aspirin EC 81 MG tablet Take 81 mg by mouth at bedtime.      azithromycin (ZITHROMAX Z-PAK) 250 MG tablet Take 2 tabs today, then 1 tab until gone 6 each 0   B-D ULTRAFINE III SHORT PEN 31G X 8 MM MISC USE UTD  0   Budeson-Glycopyrrol-Formoterol (BREZTRI AEROSPHERE) 160-9-4.8 MCG/ACT AERO Inhale 2 puffs into the lungs in the morning and at bedtime. 1 g 0   Budeson-Glycopyrrol-Formoterol (BREZTRI AEROSPHERE) 160-9-4.8 MCG/ACT AERO Inhale 2 puffs into the lungs in the morning and at bedtime. 5.9 g 0   busPIRone (BUSPAR) 15 MG tablet Take 30 mg by mouth 2 (two) times daily.     clopidogrel (PLAVIX) 75 MG tablet Take 75 mg by mouth at bedtime.     dapagliflozin propanediol (FARXIGA) 10 MG TABS tablet Take 1 tablet (10 mg total) by mouth daily before breakfast. 30 tablet 3   docusate sodium (COLACE) 100 MG capsule Take 400 mg by mouth at bedtime.     famotidine (PEPCID) 40 MG tablet Take 40 mg by  mouth daily.     fluticasone (FLONASE) 50 MCG/ACT nasal spray Place 1 spray into both nostrils daily.     furosemide (LASIX) 40 MG tablet Take 1 tablet (40 mg total) by mouth daily as needed for fluid or edema. (Patient not taking: Reported on 07/30/2021) 30 tablet 3   insulin detemir (LEVEMIR) 100 UNIT/ML injection Inject 0.16 mLs (16 Units total) into the skin 2 (two) times daily. (Patient taking differently: Inject 18 Units into the skin 2 (two) times daily. 18 units in am, 22units at hs) 10 mL 1   insulin lispro (HUMALOG) 100 UNIT/ML injection Inject 0.05-0.09 mLs (5-9 Units total) into the skin 2 (two) times daily. 5 units at lunch, and 9 units after dinner. (Patient taking differently: Inject 5-15 Units into the skin 2 (two) times daily. 5-15 units as needed  (sliding scale insulin)) 10 mL 1   isosorbide mononitrate (IMDUR) 120 MG 24 hr tablet TAKE 1 TABLET(120 MG) BY MOUTH DAILY 90 tablet 3   levothyroxine (SYNTHROID, LEVOTHROID) 100 MCG  tablet Take 100 mcg by mouth See admin instructions. Take 1 tablet (100 mcg) by mouth daily in the morning, except on Sundays     metoprolol (LOPRESSOR) 50 MG tablet Take 50 mg by mouth 2 (two) times daily.      montelukast (SINGULAIR) 10 MG tablet Take 1 tablet (10 mg total) by mouth at bedtime. 30 tablet 11   nitroGLYCERIN (NITROSTAT) 0.4 MG SL tablet Place 1 tablet (0.4 mg total) under the tongue every 5 (five) minutes as needed for chest pain. 15 tablet 3   ONE TOUCH ULTRA TEST test strip      pantoprazole (PROTONIX) 40 MG tablet Take 40 mg by mouth at bedtime.      predniSONE (DELTASONE) 10 MG tablet Take 4 tablets (40 mg total) by mouth daily with breakfast for 2 days, THEN 3 tablets (30 mg total) daily with breakfast for 2 days, THEN 2 tablets (20 mg total) daily with breakfast for 2 days, THEN 1 tablet (10 mg total) daily with breakfast for 2 days. 20 tablet 0   ranolazine (RANEXA) 500 MG 12 hr tablet Take 2 tablets by mouth twice daily 180 tablet 1   rosuvastatin (CRESTOR) 20 MG tablet TAKE 1 TABLET BY MOUTH EVERY DAY. DISCONTINUE SIMVASTATIN 90 tablet 3     Assessment: 75 y.o. male with chest pain for heparin  Goal of Therapy:  Heparin level 0.3-0.7 units/ml Monitor platelets by anticoagulation protocol: Yes   Plan:  Heparin 4000 units IV bolus, then start heparin 1000 units/hr Check heparin level in 6 hours.   Caryl Pina 08/21/2021,3:06 AM

## 2021-08-21 NOTE — ED Provider Notes (Signed)
Pt reports he did not take his evening metoprolol dosing which could explain his tachycardia   Ripley Fraise, MD 08/21/21 [id number]

## 2021-08-22 DIAGNOSIS — I200 Unstable angina: Secondary | ICD-10-CM | POA: Diagnosis present

## 2021-08-22 DIAGNOSIS — R778 Other specified abnormalities of plasma proteins: Secondary | ICD-10-CM

## 2021-08-22 DIAGNOSIS — Z888 Allergy status to other drugs, medicaments and biological substances status: Secondary | ICD-10-CM | POA: Diagnosis not present

## 2021-08-22 DIAGNOSIS — I088 Other rheumatic multiple valve diseases: Secondary | ICD-10-CM | POA: Diagnosis present

## 2021-08-22 DIAGNOSIS — Z79890 Hormone replacement therapy (postmenopausal): Secondary | ICD-10-CM | POA: Diagnosis not present

## 2021-08-22 DIAGNOSIS — Z8349 Family history of other endocrine, nutritional and metabolic diseases: Secondary | ICD-10-CM | POA: Diagnosis not present

## 2021-08-22 DIAGNOSIS — Z8616 Personal history of COVID-19: Secondary | ICD-10-CM | POA: Diagnosis not present

## 2021-08-22 DIAGNOSIS — E785 Hyperlipidemia, unspecified: Secondary | ICD-10-CM | POA: Diagnosis present

## 2021-08-22 DIAGNOSIS — Z951 Presence of aortocoronary bypass graft: Secondary | ICD-10-CM | POA: Diagnosis not present

## 2021-08-22 DIAGNOSIS — N1832 Chronic kidney disease, stage 3b: Secondary | ICD-10-CM

## 2021-08-22 DIAGNOSIS — E782 Mixed hyperlipidemia: Secondary | ICD-10-CM

## 2021-08-22 DIAGNOSIS — I255 Ischemic cardiomyopathy: Secondary | ICD-10-CM

## 2021-08-22 DIAGNOSIS — Z955 Presence of coronary angioplasty implant and graft: Secondary | ICD-10-CM

## 2021-08-22 DIAGNOSIS — Z7982 Long term (current) use of aspirin: Secondary | ICD-10-CM | POA: Diagnosis not present

## 2021-08-22 DIAGNOSIS — K219 Gastro-esophageal reflux disease without esophagitis: Secondary | ICD-10-CM | POA: Diagnosis present

## 2021-08-22 DIAGNOSIS — Z794 Long term (current) use of insulin: Secondary | ICD-10-CM | POA: Diagnosis not present

## 2021-08-22 DIAGNOSIS — E039 Hypothyroidism, unspecified: Secondary | ICD-10-CM | POA: Diagnosis present

## 2021-08-22 DIAGNOSIS — Z7902 Long term (current) use of antithrombotics/antiplatelets: Secondary | ICD-10-CM | POA: Diagnosis not present

## 2021-08-22 DIAGNOSIS — Z20822 Contact with and (suspected) exposure to covid-19: Secondary | ICD-10-CM | POA: Diagnosis present

## 2021-08-22 DIAGNOSIS — I5023 Acute on chronic systolic (congestive) heart failure: Secondary | ICD-10-CM | POA: Diagnosis present

## 2021-08-22 DIAGNOSIS — I25110 Atherosclerotic heart disease of native coronary artery with unstable angina pectoris: Secondary | ICD-10-CM | POA: Diagnosis present

## 2021-08-22 DIAGNOSIS — I214 Non-ST elevation (NSTEMI) myocardial infarction: Secondary | ICD-10-CM | POA: Diagnosis present

## 2021-08-22 DIAGNOSIS — I130 Hypertensive heart and chronic kidney disease with heart failure and stage 1 through stage 4 chronic kidney disease, or unspecified chronic kidney disease: Secondary | ICD-10-CM | POA: Diagnosis present

## 2021-08-22 DIAGNOSIS — E1122 Type 2 diabetes mellitus with diabetic chronic kidney disease: Secondary | ICD-10-CM | POA: Diagnosis present

## 2021-08-22 DIAGNOSIS — Z8249 Family history of ischemic heart disease and other diseases of the circulatory system: Secondary | ICD-10-CM | POA: Diagnosis not present

## 2021-08-22 DIAGNOSIS — I447 Left bundle-branch block, unspecified: Secondary | ICD-10-CM | POA: Diagnosis present

## 2021-08-22 DIAGNOSIS — Z87442 Personal history of urinary calculi: Secondary | ICD-10-CM | POA: Diagnosis not present

## 2021-08-22 DIAGNOSIS — I252 Old myocardial infarction: Secondary | ICD-10-CM | POA: Diagnosis not present

## 2021-08-22 LAB — BASIC METABOLIC PANEL
Anion gap: 9 (ref 5–15)
BUN: 26 mg/dL — ABNORMAL HIGH (ref 8–23)
CO2: 19 mmol/L — ABNORMAL LOW (ref 22–32)
Calcium: 8.6 mg/dL — ABNORMAL LOW (ref 8.9–10.3)
Chloride: 107 mmol/L (ref 98–111)
Creatinine, Ser: 1.8 mg/dL — ABNORMAL HIGH (ref 0.61–1.24)
GFR, Estimated: 39 mL/min — ABNORMAL LOW (ref >60)
Glucose, Bld: 185 mg/dL — ABNORMAL HIGH (ref 70–99)
Potassium: 4.2 mmol/L (ref 3.5–5.1)
Sodium: 135 mmol/L (ref 135–145)

## 2021-08-22 LAB — CBC
HCT: 48.8 % (ref 39.0–52.0)
Hemoglobin: 15.9 g/dL (ref 13.0–17.0)
MCH: 31.4 pg (ref 26.0–34.0)
MCHC: 32.6 g/dL (ref 30.0–36.0)
MCV: 96.4 fL (ref 80.0–100.0)
Platelets: 214 10*3/uL (ref 150–400)
RBC: 5.06 MIL/uL (ref 4.22–5.81)
RDW: 13.6 % (ref 11.5–15.5)
WBC: 7.8 10*3/uL (ref 4.0–10.5)
nRBC: 0 % (ref 0.0–0.2)

## 2021-08-22 LAB — GLUCOSE, CAPILLARY
Glucose-Capillary: 196 mg/dL — ABNORMAL HIGH (ref 70–99)
Glucose-Capillary: 214 mg/dL — ABNORMAL HIGH (ref 70–99)
Glucose-Capillary: 87 mg/dL (ref 70–99)

## 2021-08-22 NOTE — Plan of Care (Signed)
  Problem: Education: Goal: Knowledge of General Education information will improve Description: Including pain rating scale, medication(s)/side effects and non-pharmacologic comfort measures Outcome: Completed/Met   Problem: Health Behavior/Discharge Planning: Goal: Ability to manage health-related needs will improve Outcome: Completed/Met   Problem: Clinical Measurements: Goal: Ability to maintain clinical measurements within normal limits will improve Outcome: Completed/Met Goal: Will remain free from infection Outcome: Completed/Met Goal: Diagnostic test results will improve Outcome: Completed/Met Goal: Respiratory complications will improve Outcome: Completed/Met Goal: Cardiovascular complication will be avoided Outcome: Completed/Met   Problem: Activity: Goal: Risk for activity intolerance will decrease Outcome: Completed/Met   Problem: Nutrition: Goal: Adequate nutrition will be maintained Outcome: Completed/Met   Problem: Coping: Goal: Level of anxiety will decrease Outcome: Completed/Met   Problem: Elimination: Goal: Will not experience complications related to bowel motility Outcome: Completed/Met Goal: Will not experience complications related to urinary retention Outcome: Completed/Met   Problem: Pain Managment: Goal: General experience of comfort will improve Outcome: Completed/Met   Problem: Safety: Goal: Ability to remain free from injury will improve Outcome: Completed/Met   Problem: Skin Integrity: Goal: Risk for impaired skin integrity will decrease Outcome: Completed/Met   Problem: Education: Goal: Understanding of CV disease, CV risk reduction, and recovery process will improve Outcome: Completed/Met Goal: Individualized Educational Video(s) Outcome: Completed/Met   Problem: Activity: Goal: Ability to return to baseline activity level will improve Outcome: Completed/Met   Problem: Cardiovascular: Goal: Ability to achieve and maintain  adequate cardiovascular perfusion will improve Outcome: Completed/Met Goal: Vascular access site(s) Level 0-1 will be maintained Outcome: Completed/Met   Problem: Health Behavior/Discharge Planning: Goal: Ability to safely manage health-related needs after discharge will improve Outcome: Completed/Met

## 2021-08-22 NOTE — Discharge Summary (Signed)
Physician Discharge Summary  [name] MRN: [id number] DOB/AGE: Nov 15, 1946 75 y.o.  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  Hospital Course:   75 y.o. Caucasian male  with known coronary artery disease s/p CABG in 2012, ischemic cardiomyopathy, chronic HFrEF, left bundle branch block that was noted  2014, hypertension with chronic kidney disease, diabetes, hyperlipidemia and history of Covid 19 infection during March 2022.  Presented to [facility] ED with anginal discomfort and a peak troponin of 618.  EKG noted sinus tachycardia and normal chest x-ray.  However patient continued to have anginal discomfort despite being on a nitro drip in the setting of non-STEMI and was taken to the Cath Lab urgently to evaluate his coronary anatomy and disease burden.  Patient was noted to have disease in the SVG to the RCA graft and underwent successful PCI intervention with restoration of TIMI-3 flow.  Patient was monitored into central overnight, he did not have any chest pain or anginal equivalents.  He remains euvolemic and not in congestive heart failure.  No dysrhythmias on telemetry.  Patient renal function improving post left heart catheterization. Patient has been ambulating on the floor with rehab and nursing staff.  Patient wishes to go home.  Discharge Exam: Today's Vitals   08/21/21 2348 08/22/21 0000 08/22/21 0353 08/22/21 0716  BP: 122/75 126/87 138/80 140/77  Pulse: 78 73 79 78  Resp: 20 20 20 16   Temp: 97.9 F (36.6 C)  98 F (36.7 C) 98.1 F (36.7 C)  TempSrc: Oral  Oral Oral  SpO2: 94% 91% 98% 98%  Weight:      Height:      PainSc:    0-No pain    Body mass index is 24.82 kg/m.  CONSTITUTIONAL: Well-developed and well-nourished. No acute distress.  SKIN: Skin is warm and dry. No rash noted. No cyanosis. No pallor. No jaundice HEAD: Normocephalic and atraumatic.  EYES: No scleral icterus MOUTH/THROAT: Moist oral membranes.  NECK: No JVD present. No thyromegaly noted. No carotid bruits  LYMPHATIC: No visible cervical adenopathy.  CHEST Normal respiratory effort. No intercostal retractions  LUNGS: Clear to auscultation bilaterally no stridor. No wheezes. No rales.  CARDIOVASCULAR: Regular rate and rhythm, positive S1-S2, no murmurs rubs or gallops appreciated ABDOMINAL: Soft, nontender, nondistended, positive bowel sounds in all 4 quadrants, no apparent ascites.  EXTREMITIES: No peripheral edema.  Ecchymosis noted at the right femoral arteriotomy site, no hematoma or bruit appreciated the ecchymosis area is demarcated.  HEMATOLOGIC: No significant bruising NEUROLOGIC: Oriented to person, place, and time. Nonfocal. Normal muscle tone.  PSYCHIATRIC: Normal mood and affect. Normal behavior. Cooperative   Recommendations on discharge:   #1 Dual antiplatelet therapy #2 Careful monitoring and observing his right femoral site as it currently has ecchymosis without any significant hematoma or bruit.  Femoral care site instructions provided. #3 Up titration of GDMT as hemodynamics and laboratory values allow. #4 Patient is educated on the importance of secondary prevention and improving his modifiable cardiovascular risk factors. #5 patient will need follow-up blood work and if renal function is stable consider addition of ARB/Arni, MRA.  #6 Will need outpatient echo to reevaluate LVEF given the recent NSTEMI  Did the patient have an acute  coronary syndrome (MI, NSTEMI, STEMI, etc) this admission?: Yes                               AHA/ACC Clinical Performance & Quality Measures: Aspirin prescribed? - Yes ADP Receptor Inhibitor  (Plavix/Clopidogrel, Brilinta/Ticagrelor or Effient/Prasugrel) prescribed (includes medically managed patients)? - Yes Beta Blocker prescribed? - Yes High Intensity Statin (Lipitor 40-80mg  or Crestor 20-40mg ) prescribed? - Yes EF assessed during THIS hospitalization? - No - LV Gram due to CKD. Recent echo 06/2021 For EF <40%, was ACEI/ARB prescribed? - No - Reason:  Allergic to ACEi and given the renal function and A/CHFrEF focusing on diuresis and will consider ARB/ARNi as outpatient.  For EF <40%, Aldosterone Antagonist (Spironolactone or Eplerenone) prescribed? - No - Reason:  given the renal function and A/C HFrEF focusing on diuresis and will consider MRA as outpatient. Cardiac Rehab Phase II ordered (including medically managed patients)? - Yes  CARDIAC DATABASE:  Left Heart Catheterization 08/21/21: Comparison 11/24/2018 LV: 124/7, EDP 13 mmHg.  Ao 122/70, mean 92 mmHg.  No pressure gradient across the aortic valve. LM: Diffusely diseased with occluded proximal LAD and proximal circumflex with minor branches evident. RCA: Occluded in the midsegment and severely diseased proximally. SVG to RCA: Proximal to mid segment has a ulcerated 80 to 90% stenosis.  Native vessel itself in the PL branch has a diffuse 90% stenosis and PDA has 60 to 70% stenosis.  This is unchanged from prior cardiac catheterization. Successful direct stenting with use of a spider filter wire for distal protection and implantation of a 4.0 x 16 mm Synergy XD at 16 atmospheric pressure, 60 seconds, stenosis reduced to 0%.  TIMI-3 to TIMI-3 flow. SVG to D1: Occluded. Free radial graft to OM1: Widely patent. LIMA to LAD: Widely patent.  There is mild to moderate disease in the native LAD which appears to have improved from prior cardiac catheterization.  Recommendation: Patient will be placed on dual antiplatelet therapy.  He will need to be on diuretics in the morning, please start Carrington Clamp next week once renal function is  stable.  He can potentially go home either tomorrow if renal function is stable and he is hemodynamically stable.  60 mL contrast utilized.   PCV MYOCARDIAL PERFUSION WITH LEXISCAN 11/24/2020 Lexiscan nuclear stress test performed using 1-day protocol. Rest and stress EKG showed sinus rhythm, IVCD/tachycardia. Normal myocardial perfusion. Stress LVEF 25%. High risk study due to low stress LVEF. Further discuss with Dr. Einar Gip, suspects that the LVEF is an error, likely secondary to known LBBB.    Echocardiogram 07/08/2021:  Left ventricle cavity is mildly dilated. Mild concentric hypertrophy of the left ventricle. Moderate global hypokinesis. LVEF 30-35%. Abnormal septal wall motion due to left bundle branch block. Indeterminate diastolic filling pattern.  Moderate (Grade II) mitral regurgitation.  Mild tricuspid regurgitation.  Mild pulmonic regurgitation.  No evidence of pulmonary hypertension.  Compared to previous study on 11/18/2020, LVEF appears lower (reported 45-50% then).   EKG: 08/21/2021: Sinus tachycardia at a rate of 118 bpm.  Left bundle branch block, no further analysis. 06/29/2021: Sinus rhythm at a rate of 71 bpm.  Left atrial enlargement.  Left bundle branch block, no further analysis.  Labs:   Lab Results  Component Value Date   WBC 7.8 08/22/2021   HGB 15.9 08/22/2021   HCT 48.8 08/22/2021   MCV 96.4 08/22/2021   PLT 214 08/22/2021    Recent Labs  Lab  08/22/21 0115  NA 135  K 4.2  CL 107  CO2 19*  BUN 26*  CREATININE 1.80*  CALCIUM 8.6*  GLUCOSE 185*    Lipid Panel  No results found for: CHOL, TRIG, HDL, CHOLHDL, VLDL, LDLCALC  BNP (last 3 results) Recent Labs    06/29/21 1303 07/06/21 1144 08/21/21 1003  BNP 536.1* 384.5* 1,283.9*    HEMOGLOBIN A1C Lab Results  Component Value Date   HGBA1C 6.6 (H) 08/21/2021   MPG 142.72 08/21/2021    Cardiac Panel (last 3 results) No results for input(s): CKTOTAL, CKMB, TROPONINI, RELINDX in the last  8760 hours.  No results found for: CKTOTAL, CKMB, CKMBINDEX, TROPONINI   TSH No results for input(s): TSH in the last 8760 hours.  Radiology: CARDIAC CATHETERIZATION  Result Date: 08/21/2021 Left Heart Catheterization 08/21/21: Comparison 11/24/2018 LV: 124/7, EDP 13 mmHg.  Ao 122/70, mean 92 mmHg.  No pressure gradient across the aortic valve. LM: Diffusely diseased with occluded proximal LAD and proximal circumflex with minor branches evident. RCA: Occluded in the midsegment and severely diseased proximally. SVG to RCA: Proximal to mid segment has a ulcerated 80 to 90% stenosis.  Native vessel itself in the PL branch has a diffuse 90% stenosis and PDA has 60 to 70% stenosis.  This is unchanged from prior cardiac catheterization. Successful direct stenting with use of a spider filter wire for distal protection and implantation of a 4.0 x 16 mm Synergy XD at 16 atmospheric pressure, 60 seconds, stenosis reduced to 0%.  TIMI-3 to TIMI-3 flow. SVG to D1: Occluded. Free radial graft to OM1: Widely patent. LIMA to LAD: Widely patent.  There is mild to moderate disease in the native LAD which appears to have improved from prior cardiac catheterization. Recommendation: Patient will be placed on dual antiplatelet therapy.  He will need to be on diuretics in the morning, please start Carrington Clamp next week once renal function is stable.  He can potentially go home either tomorrow if renal function is stable and he is hemodynamically stable.  60 mL contrast utilized.   DG Chest Port 1 View  Result Date: 08/21/2021 CLINICAL DATA:  Chest pain for several hours, initial encounter EXAM: PORTABLE CHEST 1 VIEW COMPARISON:  10/29/2020, CT from 01/09/2021 FINDINGS: Cardiac shadow is mildly enlarged. Changes of prior coronary bypass grafting are again seen. Lungs are well aerated bilaterally. Mild elevation of the left hemidiaphragm is seen. No focal infiltrate or sizable effusion is noted. No bony abnormality is seen.  IMPRESSION: No active disease. Electronically Signed   By: Inez Catalina M.D.   On: 08/21/2021 01:44      FOLLOW UP PLANS AND APPOINTMENTS Discharge Instructions     AMB Referral to Cardiac Rehabilitation - Phase II   Complete by: As directed    Diagnosis:  NSTEMI Coronary Stents     After initial evaluation and assessments completed: Virtual Based Care may be provided alone or in conjunction with Phase 2 Cardiac Rehab based on patient barriers.: Yes      Allergies as of 08/22/2021       Reactions   Clonazepam Other (See Comments)   Headache   Lisinopril Other (See Comments)   headache        Medication List     STOP taking these medications    clopidogrel 75 MG tablet Commonly known as: PLAVIX   predniSONE 10 MG tablet Commonly known as: DELTASONE       TAKE these medications    acetaminophen 650  MG CR tablet Commonly known as: TYLENOL Take 1,300 mg by mouth every 8 (eight) hours as needed for pain. PRN   amLODipine 5 MG tablet Commonly known as: NORVASC Take 2 tablets (10 mg total) by mouth daily.   aspirin EC 81 MG tablet Take 81 mg by mouth at bedtime.   azithromycin 250 MG tablet Commonly known as: Zithromax Z-Pak Take 2 tabs today, then 1 tab until gone What changed:  how much to take how to take this when to take this additional instructions   B-D ULTRAFINE III SHORT PEN 31G X 8 MM Misc Generic drug: Insulin Pen Needle USE UTD   Breztri Aerosphere 160-9-4.8 MCG/ACT Aero Generic drug: Budeson-Glycopyrrol-Formoterol Inhale 2 puffs into the lungs in the morning and at bedtime.   busPIRone 15 MG tablet Commonly known as: BUSPAR Take 30 mg by mouth 2 (two) times daily.   dapagliflozin propanediol 10 MG Tabs tablet Commonly known as: Farxiga Take 1 tablet (10 mg total) by mouth daily before breakfast.   docusate sodium 100 MG capsule Commonly known as: COLACE Take 400 mg by mouth at bedtime.   famotidine 40 MG tablet Commonly known  as: PEPCID Take 40 mg by mouth daily.   fluticasone 50 MCG/ACT nasal spray Commonly known as: FLONASE Place 1 spray into both nostrils daily.   furosemide 40 MG tablet Commonly known as: LASIX Take 1 tablet (40 mg total) by mouth daily as needed for fluid or edema.   insulin detemir 100 UNIT/ML injection Commonly known as: LEVEMIR Inject 0.16 mLs (16 Units total) into the skin 2 (two) times daily. What changed: how much to take   insulin lispro 100 UNIT/ML injection Commonly known as: HUMALOG Inject 0.05-0.09 mLs (5-9 Units total) into the skin 2 (two) times daily. 5 units at lunch, and 9 units after dinner. What changed:  how much to take additional instructions   isosorbide mononitrate 120 MG 24 hr tablet Commonly known as: IMDUR TAKE 1 TABLET(120 MG) BY MOUTH DAILY What changed: See the new instructions.   levothyroxine 100 MCG tablet Commonly known as: SYNTHROID Take 100 mcg by mouth See admin instructions. Take 1 tablet (100 mcg) by mouth daily in the morning, except on Sundays   metoprolol tartrate 50 MG tablet Commonly known as: LOPRESSOR Take 50 mg by mouth 2 (two) times daily.   montelukast 10 MG tablet Commonly known as: SINGULAIR Take 1 tablet (10 mg total) by mouth at bedtime.   nitroGLYCERIN 0.4 MG SL tablet Commonly known as: NITROSTAT Place 1 tablet (0.4 mg total) under the tongue every 5 (five) minutes as needed for chest pain.   ONE TOUCH ULTRA TEST test strip Generic drug: glucose blood   pantoprazole 40 MG tablet Commonly known as: PROTONIX Take 40 mg by mouth at bedtime.   ranolazine 500 MG 12 hr tablet Commonly known as: RANEXA Take 2 tablets by mouth twice daily What changed: how much to take   rosuvastatin 20 MG tablet Commonly known as: CRESTOR TAKE 1 TABLET BY MOUTH EVERY DAY. DISCONTINUE SIMVASTATIN What changed: See the new instructions.   ticagrelor 90 MG Tabs tablet Commonly known as: BRILINTA Take 1 tablet (90 mg total) by  mouth 2 (two) times daily.        Follow-up Information     Alethia Berthold, PA-C Follow up on 08/27/2021.   Specialty: Cardiology Why: 08/27/21 @9 :15am Contact information: [address] [address] [phone]  Total time spent: 41 minutes.  Rex Kras, [place], [facility]  Pager: [phone] Office: [phone]

## 2021-08-22 NOTE — Progress Notes (Signed)
Pt got discharged to home, discharge instructions provided and patient showed understanding to it, IV taken out,Telemonitor DC,pt left unit in wheelchair with all of the belongings accompanied with a family member (wife)  Kary Kos Voucher is provided before pt left to the hospital  Kataryna Mcquilkin,RN

## 2021-08-22 NOTE — Progress Notes (Signed)
CARDIAC REHAB PHASE I   Offered to walk with pt. Pt states ambulation in hallway this morning without difficulty. Stent/MI education completed with pt. Pt educated on importance of ASA, Brilinta, and NTG. Pt given MI book along with heart healthy and diabetic diets. Reviewed site care, restrictions, and exercise guidelines. Encouraged daily weights. Will refer to CRP II [place] with knowledge pt does not want to participate. Hopeful for d/c today.  [id number] Rufina Falco, RN BSN 08/22/2021 8:51 AM

## 2021-08-24 ENCOUNTER — Encounter (HOSPITAL_COMMUNITY): Payer: Self-pay | Admitting: Cardiology

## 2021-08-24 ENCOUNTER — Telehealth: Payer: Self-pay

## 2021-08-24 NOTE — Telephone Encounter (Signed)
Location of hospitalization: [place] Reason for hospitalization: unstable angina  Date of discharge: 08/22/21 Date of first communication with patient: today Person contacting patient: Baxter Flattery  Current symptoms: NA Do you understand why you were in the Hospital: Yes Questions regarding discharge instructions: None Where were you discharged to: Home Medications reviewed: Yes Allergies reviewed: Yes Dietary changes reviewed: Yes. Discussed low fat and low salt diet.  Referals reviewed: NA Activities of Daily Living: Able to with mild limitations Any transportation issues/concerns: None Any patient concerns: None Confirmed importance & date/time of Follow up appt: Yes Confirmed with patient if condition begins to worsen call. Pt was given the office number and encouraged to call back with questions or concerns: Yes

## 2021-08-25 ENCOUNTER — Telehealth: Payer: Self-pay | Admitting: Student

## 2021-08-25 NOTE — Telephone Encounter (Signed)
Pt needs a call back about restrictions after heart cath.

## 2021-08-25 NOTE — Telephone Encounter (Signed)
Called and spoke to pt, pt voiced understanding.

## 2021-08-26 NOTE — Progress Notes (Signed)
Primary Physician:  Merrilee Seashore, MD   [name], male    DOB: Jun 29, 1946, 75 y.o.   MRN: [id number]  Subjective:    Chief Complaint  Patient presents with   Chronic systolic (congestive) heart failure    Hospitalization Follow-up   NSTEMI    HPI: Jesus Young  is a 75 y.o. male  with known coronary artery disease s/p CABG in 2012,  left bundle branch block that was noted  2014, hypertension, diabetes, hyperlipidemia and chronic kidney disease. History of Covid 19 infection during March 2022.   Due to complaints of left arm pain with exertion, underwent lexiscan nuclear stress test on 08/07/2018 considered high risk study due to LVEF; however, on echocardiogram had normal LVEF.  No significant changes were noted compared to the 2014, but in view of continued symptoms despite medical management, he underwent coronary angiogram on 12/05/2018 and patient was noted to have small vessel and microvascular disease. Patient again presented with exertional symptoms, therefore underwent repeat echocardiogram and nuclear stress test 11/2020.  Stress test was high risk given low stress LVEF, however this was reviewed with Dr. Einar Gip who felt it was likely an error related to known left bundle branch block as echocardiogram at that time revealed LVEF 45-50%.  Due to worsening shortness of breath he underwent repeat echocardiogram 06/2021 which revealed LVEF had reduced further to 30-35%.  Patient then presented to [facility] emergency department with unstable angina 08/21/2021.  Patient subsequently underwent urgent left heart catheterization which revealed disease in SVG to RCA graft, underwent successful PCI and discharged home on aspirin and Brilinta.  Patient reports he is feeling well overall since PCI.  He has had no recurrence of significant chest and left arm pain.  He does report a few episodes of mild left arm discomfort as well as mild nausea which she rates on a scale  of 1/10 in severity.  Patient denies dyspnea, orthopnea, palpitations, syncope, near syncope, leg swelling.  Past Medical History:  Diagnosis Date   Anxiety    Arthritis    back and knees   Bronchitis    hx of 2015   Cancer ([place])    prostate   Cataract    immature on right   Chronic back pain    CKD (chronic kidney disease)    Constipation    takes Colace at bedtime   Coronary artery disease    takes Plavix daily but is on hold for surgery   Depression    Diabetes mellitus without complication (HCC)    takes Levemir and Humalog daily   Dizziness    Fall    hx of with injury to left shoulder    GERD (gastroesophageal reflux disease)    takes Protonix daily and Zantac   H/O urinary frequency    Hemorrhoids    History of bladder infections    History of kidney stones    Hyperlipidemia    takes Zocor every evening   Hypertension    takes Metoprolol daily   Hypothyroidism    takes Synthroid daily   Left bundle branch block    Myocardial infarction [facility])    many yrs before 2002   Night muscle spasms    takes Robaxin 4 x day    Nocturia    Past Surgical History:  Procedure Laterality Date   CARDIAC CATHETERIZATION  05/17/02   COLONOSCOPY     CORONARY ANGIOPLASTY     x 1  CORONARY ARTERY BYPASS GRAFT  2002   x 5   eyelid surgery      INGUINAL HERNIA REPAIR Left 05/06/2020   Procedure: LEFT INGUINAL HERNIA REPAIR;  Surgeon: Donnie Mesa, MD;  Location: [place];  Service: General;  Laterality: Left;   INSERTION OF MESH Left 05/06/2020   Procedure: INSERTION OF MESH;  Surgeon: Donnie Mesa, MD;  Location: [place];  Service: General;  Laterality: Left;   LEFT HEART CATH AND CORS/GRAFTS ANGIOGRAPHY N/A 12/05/2018   Procedure: LEFT HEART CATH AND CORS/GRAFTS ANGIOGRAPHY;  Surgeon: Adrian Prows, MD;  Location: [place] [facility];  Service: Cardiovascular;  Laterality: N/A;   LEFT HEART CATH AND CORS/GRAFTS ANGIOGRAPHY N/A 08/21/2021   Procedure: LEFT HEART CATH AND  CORS/GRAFTS ANGIOGRAPHY;  Surgeon: Adrian Prows, MD;  Location: [place] [facility];  Service: Cardiovascular;  Laterality: N/A;   LUMBAR LAMINECTOMY/DECOMPRESSION MICRODISCECTOMY Bilateral 07/18/2013   Procedure: Bilateral Lumbar four-five Lumbar Laminotomy/Microdiskectomy;  Surgeon: Hosie Spangle, MD;  Location: [place] [facility];  Service: Neurosurgery;  Laterality: Bilateral;  Bilateral Lumbar four-five Lumbar Laminotomy/Microdiskectomy   NEPHROLITHOTOMY Left 06/23/2015   Procedure: NEPHROLITHOTOMY PERCUTANEOUS;  Surgeon: [name], MD;  Location: [facility];  Service: Urology;  Laterality: Left;   PROSTATECTOMY  10/2009   skin spots removed from back -precancerous     TONSILLECTOMY     Family History  Problem Relation Age of Onset   Heart disease Father    Heart attack Father    Thyroid disease Sister    Social History   Tobacco Use   Smoking status: Never   Smokeless tobacco: Never  Substance Use Topics   Alcohol use: No  Martial status: Married  ROS   Review of Systems  Constitutional: Negative for malaise/fatigue and weight gain.  Cardiovascular:  Negative for chest pain, claudication, dyspnea on exertion, leg swelling, near-syncope, orthopnea, palpitations, paroxysmal nocturnal dyspnea and syncope.  Respiratory:  Negative for shortness of breath.   Musculoskeletal:        Occasional mild left arm discomfort, improved following PCI     Objective:  Blood pressure (!) 143/62, pulse 65, temperature 97.7 F (36.5 C), temperature source Temporal, resp. rate 16, height 6\' 2"  (1.88 m), weight 191 lb (86.6 kg), SpO2 98 %. Body mass index is 24.52 kg/m.   Physical Exam Vitals reviewed.  Constitutional:      General: He is not in acute distress.    Appearance: He is well-developed.  Cardiovascular:     Rate and Rhythm: Normal rate and regular rhythm.     Pulses: Intact distal pulses.          Carotid pulses are 2+ on the right side and 2+ on the left side.      Radial pulses are  2+ on the right side and 2+ on the left side.       Femoral pulses are 2+ on the right side and 2+ on the left side.      Popliteal pulses are 2+ on the right side and 2+ on the left side.       Dorsalis pedis pulses are 2+ on the right side and 2+ on the left side.       Posterior tibial pulses are 2+ on the right side and 2+ on the left side.     Heart sounds: Normal heart sounds, S1 normal and S2 normal. No murmur heard.   No gallop.     Comments: Femoral access site with surrounding ecchymosis and small  hematoma. No erythema, warmth, swelling, or bruit.  Pulmonary:     Effort: Pulmonary effort is normal. No accessory muscle usage or respiratory distress.     Breath sounds: Normal breath sounds. No wheezing, rhonchi or rales.  Musculoskeletal:        General: Normal range of motion.     Cervical back: Normal range of motion.     Right lower leg: Edema (trace) present.     Left lower leg: Edema (trace) present.  Skin:    General: Skin is warm and dry.  Neurological:     Mental Status: He is alert.   Laboratory examination:    CMP Latest Ref Rng & Units 08/22/2021 08/21/2021 07/29/2021  Glucose 70 - 99 mg/dL 185(H) 199(H) 139(H)  BUN 8 - 23 mg/dL 26(H) 34(H) 29(H)  Creatinine 0.61 - 1.24 mg/dL 1.80(H) 2.13(H) 1.99(H)  Sodium 135 - 145 mmol/L 135 133(L) 135  Potassium 3.5 - 5.1 mmol/L 4.2 4.5 4.6  Chloride 98 - 111 mmol/L 107 102 100  CO2 22 - 32 mmol/L 19(L) 20(L) 20  Calcium 8.9 - 10.3 mg/dL 8.6(L) 9.3 9.5  Total Protein 6.5 - 8.1 g/dL - - -  Total Bilirubin 0.3 - 1.2 mg/dL - - -  Alkaline Phos 38 - 126 U/L - - -  AST 15 - 41 U/L - - -  ALT 17 - 63 U/L - - -   CBC Latest Ref Rng & Units 08/22/2021 08/21/2021 04/30/2020  WBC 4.0 - 10.5 K/uL 7.8 10.3 6.0  Hemoglobin 13.0 - 17.0 g/dL 15.9 16.0 16.0  Hematocrit 39.0 - 52.0 % 48.8 48.8 51.0  Platelets 150 - 400 K/uL 214 211 181   Lipid Panel  No results found for: CHOL, TRIG, HDL, CHOLHDL, VLDL, LDLCALC, LDLDIRECT HEMOGLOBIN  A1C Lab Results  Component Value Date   HGBA1C 6.6 (H) 08/21/2021   MPG 142.72 08/21/2021   TSH No results for input(s): TSH in the last 8760 hours.  External Labs:   10/21/2020: HDL 33, LDL 70, total cholesterol 132, triglycerides 167 TSH 1.62 BUN 28  04/30/2020: A1c 6.9%, serum creatinine 1.76  Allergies   Allergies  Allergen Reactions   Clonazepam Other (See Comments)    Headache    Lisinopril Other (See Comments)    headache    Medications Prior to Visit:   Outpatient Medications Prior to Visit  Medication Sig Dispense Refill   acetaminophen (TYLENOL) 650 MG CR tablet Take 1,300 mg by mouth every 8 (eight) hours as needed for pain. PRN     amLODipine (NORVASC) 5 MG tablet Take 2 tablets (10 mg total) by mouth daily. 180 tablet 2   aspirin EC 81 MG tablet Take 81 mg by mouth at bedtime.      azithromycin (ZITHROMAX Z-PAK) 250 MG tablet Take 2 tabs today, then 1 tab until gone (Patient taking differently: Take 250 mg by mouth daily. Take 2 tablets today, then 1 tablet until finished.) 6 each 0   B-D ULTRAFINE III SHORT PEN 31G X 8 MM MISC USE UTD  0   Budeson-Glycopyrrol-Formoterol (BREZTRI AEROSPHERE) 160-9-4.8 MCG/ACT AERO Inhale 2 puffs into the lungs in the morning and at bedtime. 1 g 0   busPIRone (BUSPAR) 15 MG tablet Take 30 mg by mouth 2 (two) times daily.     dapagliflozin propanediol (FARXIGA) 10 MG TABS tablet Take 1 tablet (10 mg total) by mouth daily before breakfast. 30 tablet 3   docusate sodium (COLACE) 100 MG capsule Take 400 mg  by mouth at bedtime.     famotidine (PEPCID) 40 MG tablet Take 40 mg by mouth daily.     fluticasone (FLONASE) 50 MCG/ACT nasal spray Place 1 spray into both nostrils daily.     furosemide (LASIX) 40 MG tablet Take 1 tablet (40 mg total) by mouth daily as needed for fluid or edema. 30 tablet 3   insulin detemir (LEVEMIR) 100 UNIT/ML injection Inject 0.16 mLs (16 Units total) into the skin 2 (two) times daily. (Patient taking  differently: Inject 16-18 Units into the skin 2 (two) times daily.) 10 mL 1   insulin lispro (HUMALOG) 100 UNIT/ML injection Inject 0.05-0.09 mLs (5-9 Units total) into the skin 2 (two) times daily. 5 units at lunch, and 9 units after dinner. (Patient taking differently: Inject 5-15 Units into the skin 2 (two) times daily. 5-15 units as needed  (sliding scale insulin)) 10 mL 1   isosorbide mononitrate (IMDUR) 120 MG 24 hr tablet TAKE 1 TABLET(120 MG) BY MOUTH DAILY (Patient taking differently: Take 120 mg by mouth daily.) 90 tablet 3   levothyroxine (SYNTHROID, LEVOTHROID) 100 MCG tablet Take 100 mcg by mouth See admin instructions. Take 1 tablet (100 mcg) by mouth daily in the morning, except on Sundays     metoprolol (LOPRESSOR) 50 MG tablet Take 50 mg by mouth 2 (two) times daily.      montelukast (SINGULAIR) 10 MG tablet Take 1 tablet (10 mg total) by mouth at bedtime. 30 tablet 11   nitroGLYCERIN (NITROSTAT) 0.4 MG SL tablet Place 1 tablet (0.4 mg total) under the tongue every 5 (five) minutes as needed for chest pain. 15 tablet 3   ONE TOUCH ULTRA TEST test strip      pantoprazole (PROTONIX) 40 MG tablet Take 40 mg by mouth at bedtime.      ranolazine (RANEXA) 500 MG 12 hr tablet Take 2 tablets by mouth twice daily (Patient taking differently: Take 500 mg by mouth 2 (two) times daily.) 180 tablet 1   rosuvastatin (CRESTOR) 20 MG tablet TAKE 1 TABLET BY MOUTH EVERY DAY. DISCONTINUE SIMVASTATIN (Patient taking differently: Take 20 mg by mouth daily.) 90 tablet 3   ticagrelor (BRILINTA) 90 MG TABS tablet Take 1 tablet (90 mg total) by mouth 2 (two) times daily. 60 tablet 0   No facility-administered medications prior to visit.   Final Medications at End of Visit    Current Meds  Medication Sig   acetaminophen (TYLENOL) 650 MG CR tablet Take 1,300 mg by mouth every 8 (eight) hours as needed for pain. PRN   amLODipine (NORVASC) 5 MG tablet Take 2 tablets (10 mg total) by mouth daily.   aspirin  EC 81 MG tablet Take 81 mg by mouth at bedtime.    azithromycin (ZITHROMAX Z-PAK) 250 MG tablet Take 2 tabs today, then 1 tab until gone (Patient taking differently: Take 250 mg by mouth daily. Take 2 tablets today, then 1 tablet until finished.)   B-D ULTRAFINE III SHORT PEN 31G X 8 MM MISC USE UTD   Budeson-Glycopyrrol-Formoterol (BREZTRI AEROSPHERE) 160-9-4.8 MCG/ACT AERO Inhale 2 puffs into the lungs in the morning and at bedtime.   busPIRone (BUSPAR) 15 MG tablet Take 30 mg by mouth 2 (two) times daily.   dapagliflozin propanediol (FARXIGA) 10 MG TABS tablet Take 1 tablet (10 mg total) by mouth daily before breakfast.   docusate sodium (COLACE) 100 MG capsule Take 400 mg by mouth at bedtime.   famotidine (PEPCID) 40 MG tablet Take  40 mg by mouth daily.   fluticasone (FLONASE) 50 MCG/ACT nasal spray Place 1 spray into both nostrils daily.   furosemide (LASIX) 40 MG tablet Take 1 tablet (40 mg total) by mouth daily as needed for fluid or edema.   insulin detemir (LEVEMIR) 100 UNIT/ML injection Inject 0.16 mLs (16 Units total) into the skin 2 (two) times daily. (Patient taking differently: Inject 16-18 Units into the skin 2 (two) times daily.)   insulin lispro (HUMALOG) 100 UNIT/ML injection Inject 0.05-0.09 mLs (5-9 Units total) into the skin 2 (two) times daily. 5 units at lunch, and 9 units after dinner. (Patient taking differently: Inject 5-15 Units into the skin 2 (two) times daily. 5-15 units as needed  (sliding scale insulin))   isosorbide mononitrate (IMDUR) 120 MG 24 hr tablet TAKE 1 TABLET(120 MG) BY MOUTH DAILY (Patient taking differently: Take 120 mg by mouth daily.)   levothyroxine (SYNTHROID, LEVOTHROID) 100 MCG tablet Take 100 mcg by mouth See admin instructions. Take 1 tablet (100 mcg) by mouth daily in the morning, except on Sundays   metoprolol (LOPRESSOR) 50 MG tablet Take 50 mg by mouth 2 (two) times daily.    montelukast (SINGULAIR) 10 MG tablet Take 1 tablet (10 mg total) by  mouth at bedtime.   nitroGLYCERIN (NITROSTAT) 0.4 MG SL tablet Place 1 tablet (0.4 mg total) under the tongue every 5 (five) minutes as needed for chest pain.   ONE TOUCH ULTRA TEST test strip    pantoprazole (PROTONIX) 40 MG tablet Take 40 mg by mouth at bedtime.    ranolazine (RANEXA) 500 MG 12 hr tablet Take 2 tablets by mouth twice daily (Patient taking differently: Take 500 mg by mouth 2 (two) times daily.)   rosuvastatin (CRESTOR) 20 MG tablet TAKE 1 TABLET BY MOUTH EVERY DAY. DISCONTINUE SIMVASTATIN (Patient taking differently: Take 20 mg by mouth daily.)   ticagrelor (BRILINTA) 90 MG TABS tablet Take 1 tablet (90 mg total) by mouth 2 (two) times daily.   Radiology:  No results found.  Cardiac Studies:    Coronary angiogram 12/05/2018: Left main diffusely diseased and occluded proximal LAD and circumflex pulmonary artery.  RCA occluded in the proximal segment.  SVG to RCA is patent, diffusely diseased PL branch with high-grade stenosis of 90 to 99% and PDA has about a 60 to 70% mid stenosis.  Small vessels. LAD is diffusely diseased.  It is occluded in the proximal segment.  Supplied by LIMA to LAD.  Distal to the insertion, there is a 60 to 70% diffuse disease.  Small vessel distally. Free radial graft to OM1 is widely patent.  Distal circumflex has a high-grade lesion, small vessel. SVG to D1 is occluded. Normal LV systolic function, mild posterior hypokinesis.  Normal LVEDP.  EF estimated at 55%.   PCV MYOCARDIAL PERFUSION WITH LEXISCAN 11/24/2020 Lexiscan nuclear stress test performed using 1-day protocol. Rest and stress EKG showed sinus rhythm, IVCD/tachycardia. Normal myocardial perfusion. Stress LVEF 25%. High risk study due to low stress LVEF. Further discuss with Dr. Einar Gip, suspects that the LVEF is an error, likely secondary to known LBBB.   Echocardiogram 07/08/2021:  Left ventricle cavity is mildly dilated. Mild concentric hypertrophy of the left ventricle. Moderate  global hypokinesis. LVEF 30-35%. Abnormal septal wall motion due to left bundle branch block. Indeterminate diastolic filling pattern.  Moderate (Grade II) mitral regurgitation.  Mild tricuspid regurgitation.  Mild pulmonic regurgitation.  No evidence of pulmonary hypertension.  Compared to previous study on 11/18/2020, LVEF appears  lower (reported 45-50% then).  Left Heart Catheterization 08/21/21: Comparison 11/24/2018 LV: 124/7, EDP 13 mmHg.  Ao 122/70, mean 92 mmHg.  No pressure gradient across the aortic valve. LM: Diffusely diseased with occluded proximal LAD and proximal circumflex with minor branches evident. RCA: Occluded in the midsegment and severely diseased proximally. SVG to RCA: Proximal to mid segment has a ulcerated 80 to 90% stenosis.  Native vessel itself in the PL branch has a diffuse 90% stenosis and PDA has 60 to 70% stenosis.  This is unchanged from prior cardiac catheterization. Successful direct stenting with use of a spider filter wire for distal protection and implantation of a 4.0 x 16 mm Synergy XD at 16 atmospheric pressure, 60 seconds, stenosis reduced to 0%.  TIMI-3 to TIMI-3 flow. SVG to D1: Occluded. Free radial graft to OM1: Widely patent. LIMA to LAD: Widely patent.  There is mild to moderate disease in the native LAD which appears to have improved from prior cardiac catheterization.  Recommendation: Patient will be placed on dual antiplatelet therapy.  He will need to be on diuretics in the morning, please start Carrington Clamp next week once renal function is stable.  He can potentially go home either tomorrow if renal function is stable and he is hemodynamically stable.  60 mL contrast utilized.  EKG:   08/27/2021: Sinus rhythm at a rate of 68 bpm.  Left atrial enlargement.  Left bundle branch block, no further analysis.  Compared to EKG 06/29/2021, no significant change.  Assessment:     ICD-10-CM   1. Coronary artery disease involving native coronary artery of  native heart with other form of angina pectoris ([place])  X52.841 Basic metabolic panel    Brain natriuretic peptide    2. Coronary artery disease of bypass graft of native heart with stable angina pectoris (HCC)  L24.401 Basic metabolic panel    Brain natriuretic peptide    3. Chronic systolic (congestive) heart failure (HCC)  I50.22 PCV ECHOCARDIOGRAM COMPLETE    4. CKD stage 3 due to type 2 diabetes mellitus (HCC)  E11.22    N18.30     5. Non-ST elevation (NSTEMI) myocardial infarction (HCC)  I21.4 EKG 12-Lead      No orders of the defined types were placed in this encounter.  There are no discontinued medications.  Recommendations:   Jesus Young  is a 75 y.o. with known coronary artery disease s/p CABG in 2012,  left bundle branch block that was noted  2014, hypertension, diabetes, hyperlipidemia and chronic kidney disease. History of Covid 19 infection during March 2022.   Due to complaints of left arm pain with exertion, underwent lexiscan nuclear stress test on 08/07/2018 considered high risk study due to LVEF; however, on echocardiogram had normal LVEF.  No significant changes were noted compared to the 2014, but in view of continued symptoms despite medical management, he underwent coronary angiogram on 12/05/2018 and patient was noted to have small vessel and microvascular disease. Patient again presented with exertional symptoms, therefore underwent repeat echocardiogram and nuclear stress test 11/2020.  Stress test was high risk given low stress LVEF, however this was reviewed with Dr. Einar Gip who felt it was likely an error related to known left bundle branch block as echocardiogram at that time revealed LVEF 45-50%.  Due to worsening shortness of breath he underwent repeat echocardiogram 06/2021 which revealed LVEF had reduced further to 30-35%.  Patient then presented to [facility] emergency department with unstable angina 08/21/2021.  Patient subsequently underwent  urgent left  heart catheterization which revealed disease in SVG to RCA graft, underwent successful PCI and discharged home on aspirin and Brilinta, which she is tolerating without bleeding diathesis, will continue this.  Patient has continued to have some residual mild left arm discomfort, significantly improved following PCI.  Patient's blood pressure is elevated and he has trace edema on exam.  We will repeat BMP and plan to start prandial if renal function allows.  We will plan to repeat echocardiogram in 3 months following PCI.  Follow-up in 6 weeks, sooner if needed for CAD, HFrEF.   Alethia Berthold, PA-C 08/27/2021, 11:55 AM Office: [phone]

## 2021-08-27 ENCOUNTER — Telehealth: Payer: Self-pay

## 2021-08-27 ENCOUNTER — Other Ambulatory Visit: Payer: Self-pay

## 2021-08-27 ENCOUNTER — Ambulatory Visit: Payer: Medicare Other | Admitting: Student

## 2021-08-27 ENCOUNTER — Encounter: Payer: Self-pay | Admitting: Student

## 2021-08-27 VITALS — BP 143/62 | HR 65 | Temp 97.7°F | Resp 16 | Ht 74.0 in | Wt 191.0 lb

## 2021-08-27 DIAGNOSIS — I5022 Chronic systolic (congestive) heart failure: Secondary | ICD-10-CM

## 2021-08-27 DIAGNOSIS — N183 Chronic kidney disease, stage 3 unspecified: Secondary | ICD-10-CM

## 2021-08-27 DIAGNOSIS — E1122 Type 2 diabetes mellitus with diabetic chronic kidney disease: Secondary | ICD-10-CM | POA: Diagnosis not present

## 2021-08-27 DIAGNOSIS — I25708 Atherosclerosis of coronary artery bypass graft(s), unspecified, with other forms of angina pectoris: Secondary | ICD-10-CM | POA: Diagnosis not present

## 2021-08-27 DIAGNOSIS — I214 Non-ST elevation (NSTEMI) myocardial infarction: Secondary | ICD-10-CM

## 2021-08-27 DIAGNOSIS — I25118 Atherosclerotic heart disease of native coronary artery with other forms of angina pectoris: Secondary | ICD-10-CM

## 2021-08-27 NOTE — Telephone Encounter (Signed)
Called patient to discuss [employer] Lpa study. Phone just rang and was never able to leave a message. Will attempt to call back at a later date

## 2021-08-28 DIAGNOSIS — R600 Localized edema: Secondary | ICD-10-CM | POA: Diagnosis not present

## 2021-08-28 DIAGNOSIS — I25118 Atherosclerotic heart disease of native coronary artery with other forms of angina pectoris: Secondary | ICD-10-CM | POA: Diagnosis not present

## 2021-08-28 DIAGNOSIS — I25708 Atherosclerosis of coronary artery bypass graft(s), unspecified, with other forms of angina pectoris: Secondary | ICD-10-CM | POA: Diagnosis not present

## 2021-08-29 LAB — BASIC METABOLIC PANEL
BUN/Creatinine Ratio: 13 (ref 10–24)
BUN: 25 mg/dL (ref 8–27)
CO2: 21 mmol/L (ref 20–29)
Calcium: 9.5 mg/dL (ref 8.6–10.2)
Chloride: 103 mmol/L (ref 96–106)
Creatinine, Ser: 1.87 mg/dL — ABNORMAL HIGH (ref 0.76–1.27)
Glucose: 92 mg/dL (ref 70–99)
Potassium: 5.2 mmol/L (ref 3.5–5.2)
Sodium: 141 mmol/L (ref 134–144)
eGFR: 37 mL/min/{1.73_m2} — ABNORMAL LOW (ref >59)

## 2021-08-29 LAB — BRAIN NATRIURETIC PEPTIDE: BNP: 784.1 pg/mL — ABNORMAL HIGH (ref 0.0–100.0)

## 2021-09-01 ENCOUNTER — Telehealth: Payer: Self-pay | Admitting: Pulmonary Disease

## 2021-09-01 MED ORDER — BUDESONIDE-FORMOTEROL FUMARATE 160-4.5 MCG/ACT IN AERO: 2.0000 | INHALATION_SPRAY | Freq: Two times a day (BID) | RESPIRATORY_TRACT | 6 refills | Status: DC

## 2021-09-01 NOTE — Telephone Encounter (Signed)
JE please advise on the symbicort.  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 THIS IS SAMPLE --If the inhaler helps, please call our office and we will prescribe Symbicort 54mcg or 160 mg strength for regular use --CONTINUE montelukast 10 mg daily

## 2021-09-01 NOTE — Telephone Encounter (Signed)
Rx for the symbicort has been sent to the pharmacy per pts request.  I have called and LM on VM for the pt

## 2021-09-01 NOTE — Telephone Encounter (Signed)
Please order Symbicort 160-4.5 mcg TWO puffs TWICE a day

## 2021-09-07 ENCOUNTER — Other Ambulatory Visit: Payer: Self-pay | Admitting: Student

## 2021-09-19 ENCOUNTER — Other Ambulatory Visit: Payer: Self-pay | Admitting: Cardiology

## 2021-09-21 DIAGNOSIS — I2510 Atherosclerotic heart disease of native coronary artery without angina pectoris: Secondary | ICD-10-CM | POA: Diagnosis not present

## 2021-09-21 DIAGNOSIS — N390 Urinary tract infection, site not specified: Secondary | ICD-10-CM | POA: Diagnosis not present

## 2021-09-22 ENCOUNTER — Encounter: Payer: Self-pay | Admitting: Podiatry

## 2021-09-22 ENCOUNTER — Other Ambulatory Visit: Payer: Self-pay

## 2021-09-22 ENCOUNTER — Ambulatory Visit: Payer: Medicare Other | Admitting: Podiatry

## 2021-09-22 DIAGNOSIS — D2372 Other benign neoplasm of skin of left lower limb, including hip: Secondary | ICD-10-CM

## 2021-09-22 DIAGNOSIS — B351 Tinea unguium: Secondary | ICD-10-CM

## 2021-09-22 DIAGNOSIS — E119 Type 2 diabetes mellitus without complications: Secondary | ICD-10-CM | POA: Diagnosis not present

## 2021-09-22 DIAGNOSIS — M79676 Pain in unspecified toe(s): Secondary | ICD-10-CM | POA: Diagnosis not present

## 2021-09-22 DIAGNOSIS — D2371 Other benign neoplasm of skin of right lower limb, including hip: Secondary | ICD-10-CM

## 2021-09-22 DIAGNOSIS — D689 Coagulation defect, unspecified: Secondary | ICD-10-CM

## 2021-09-22 NOTE — Progress Notes (Signed)
He presents today chief complaint of painful elongated toenails.  Objective: Toenails are long thick yellow dystrophic onychomycotic pulses remain palpable no open lesions or wounds.  Assessment: Pain in limb secondary onychomycosis.  History of diabetes mellitus without complications.  Does have a history of heart disease.  Plan: Debridement of toenails 1 through 5 bilateral.

## 2021-09-23 ENCOUNTER — Ambulatory Visit: Payer: Medicare Other | Admitting: Student

## 2021-09-23 DIAGNOSIS — M25559 Pain in unspecified hip: Secondary | ICD-10-CM | POA: Diagnosis not present

## 2021-09-23 DIAGNOSIS — M2550 Pain in unspecified joint: Secondary | ICD-10-CM | POA: Diagnosis not present

## 2021-09-23 DIAGNOSIS — R5383 Other fatigue: Secondary | ICD-10-CM | POA: Diagnosis not present

## 2021-09-23 DIAGNOSIS — M791 Myalgia, unspecified site: Secondary | ICD-10-CM | POA: Diagnosis not present

## 2021-09-28 ENCOUNTER — Ambulatory Visit (INDEPENDENT_AMBULATORY_CARE_PROVIDER_SITE_OTHER): Payer: Medicare Other | Admitting: Pulmonary Disease

## 2021-09-28 ENCOUNTER — Other Ambulatory Visit: Payer: Self-pay

## 2021-09-28 DIAGNOSIS — I700 Atherosclerosis of aorta: Secondary | ICD-10-CM | POA: Diagnosis not present

## 2021-09-28 DIAGNOSIS — E782 Mixed hyperlipidemia: Secondary | ICD-10-CM | POA: Diagnosis not present

## 2021-09-28 DIAGNOSIS — R053 Chronic cough: Secondary | ICD-10-CM

## 2021-09-28 DIAGNOSIS — I25118 Atherosclerotic heart disease of native coronary artery with other forms of angina pectoris: Secondary | ICD-10-CM | POA: Diagnosis not present

## 2021-09-28 DIAGNOSIS — R5383 Other fatigue: Secondary | ICD-10-CM | POA: Diagnosis not present

## 2021-09-28 DIAGNOSIS — M25559 Pain in unspecified hip: Secondary | ICD-10-CM | POA: Diagnosis not present

## 2021-09-28 DIAGNOSIS — M791 Myalgia, unspecified site: Secondary | ICD-10-CM | POA: Diagnosis not present

## 2021-09-28 DIAGNOSIS — E039 Hypothyroidism, unspecified: Secondary | ICD-10-CM | POA: Diagnosis not present

## 2021-09-28 DIAGNOSIS — E1121 Type 2 diabetes mellitus with diabetic nephropathy: Secondary | ICD-10-CM | POA: Diagnosis not present

## 2021-09-28 DIAGNOSIS — I129 Hypertensive chronic kidney disease with stage 1 through stage 4 chronic kidney disease, or unspecified chronic kidney disease: Secondary | ICD-10-CM | POA: Diagnosis not present

## 2021-09-28 DIAGNOSIS — M2550 Pain in unspecified joint: Secondary | ICD-10-CM | POA: Diagnosis not present

## 2021-09-28 DIAGNOSIS — J4521 Mild intermittent asthma with (acute) exacerbation: Secondary | ICD-10-CM

## 2021-09-28 DIAGNOSIS — U099 Post covid-19 condition, unspecified: Secondary | ICD-10-CM | POA: Diagnosis not present

## 2021-09-28 MED ORDER — PREDNISONE 10 MG PO TABS: ORAL_TABLET | ORAL | 0 refills | Status: AC

## 2021-09-28 MED ORDER — AZITHROMYCIN 250 MG PO TABS: 250.0000 mg | ORAL_TABLET | Freq: Every day | ORAL | 0 refills | Status: DC

## 2021-09-28 NOTE — Progress Notes (Signed)
Virtual Visit via Telephone Note  I connected with Jesus Young on 09/28/21 at 10:30 AM EST by telephone and verified that I am speaking with the correct person using two identifiers.  Location: Patient: Home Provider: [facility]   I discussed the limitations, risks, security and privacy concerns of performing an evaluation and management service by telephone and the availability of in person appointments. I also discussed with the patient that there may be a patient responsible charge related to this service. The patient expressed understanding and agreed to proceed.   I discussed the assessment and treatment plan with the patient. The patient was provided an opportunity to ask questions and all were answered. The patient agreed with the plan and demonstrated an understanding of the instructions.   The patient was advised to call back or seek an in-person evaluation if the symptoms worsen or if the condition fails to improve as anticipated.  I provided 25 minutes of non-face-to-face time during this encounter.   Geneva Barrero Rodman Pickle, MD  Subjective:   [name] GENDER: male DOB: Mar 15, 1946, MRN: [id number]   HPI  Chief Complaint  Patient presents with   Follow-up    Cough    Reason for Visit: Follow-up for cough  Mr. Coben Godshall is a 75 year old male never smoker with CAD s/p CABG, brittle DM2, HTN, allergic rhinitis who presents for follow-up  Synopsis: He had been seen by his PCP regarding chronic cough for >5 years. On benadryl in the last 2 months which seems to help decrease in frequency. He still has nasal congestion and runny nose. Not currently on nasal spray. Currently on famotidine and protonix daily though he does have occasional bloating and gas feeling. In the mornings he reports vague chest fullness/pressure/tightness. In the last three months, when he coughs in the morning, he will bring up possibly 1/3 of dixie cup of sputum.    12/29/20 Since our last visit, he has been taking benadryl 25 mg twice a day. Cough with minimal sputum production. Though improved cough does seem to occur when eating. On prior telephone call he reports that he will sometimes choke on food and need help from his wife to help. He has slowed down his eating to compensate. He does still have nasal congestion as well even with benadryl use. He completed PFTs for this visit and felt some difficulty with the test due to narrowing his airflow into the mouthpiece.  06/25/21 Since our our last visit, he tested positive for COVID-19 in March 2022. He reports increased shortness of breath that began in the last few months. Also having intermittent arm pain. He has a dry hacking cough that began two weeks ago. Compliant with montelukast. Not on any inhalers. He reports that he will sometimes wheeze when waking up or when laying down.   07/27/21 Since our last visit, he was seen by cardiology and started on lasix for heart failure. After starting diuretics, he immediately had improvement with his shortness of breath. He never started the [place] and has not felt that he needed. He has a daily cough that varies from intensity from day to day. Half the time he will produce a brownish. No wheezing. Usually occurs in the morning that can be more intense and will continue throughout the day  09/28/21 Telephone Since our last visit he has completed his Breztri sample. Did not fill Symbicort due to cost ($100). His cough initially improved off inhalers however developed  worsening cough due to viral infection. Not sure if inhalers helped or if symptoms due to infection. Currently has productive cough and shortness of breath  Review of Systems  Constitutional:  Negative for chills, diaphoresis, fever, malaise/fatigue and weight loss.  HENT:  Negative for congestion.   Respiratory:  Positive for cough and shortness of breath. Negative for hemoptysis, sputum production  and wheezing.   Cardiovascular:  Negative for chest pain, palpitations and leg swelling.   Social History: Never smoker  Second hand smoke Insurance underwriter  Past Medical History:  Diagnosis Date   Anxiety    Arthritis    back and knees   Bronchitis    hx of 2015   Cancer ([place])    prostate   Cataract    immature on right   Chronic back pain    CKD (chronic kidney disease)    Constipation    takes Colace at bedtime   Coronary artery disease    takes Plavix daily but is on hold for surgery   Depression    Diabetes mellitus without complication (HCC)    takes Levemir and Humalog daily   Dizziness    Fall    hx of with injury to left shoulder    GERD (gastroesophageal reflux disease)    takes Protonix daily and Zantac   H/O urinary frequency    Hemorrhoids    History of bladder infections    History of kidney stones    Hyperlipidemia    takes Zocor every evening   Hypertension    takes Metoprolol daily   Hypothyroidism    takes Synthroid daily   Left bundle branch block    Myocardial infarction ([place])    many yrs before 2002   Night muscle spasms    takes Robaxin 4 x day    Nocturia      Allergies  Allergen Reactions   Clonazepam Other (See Comments)    Headache    Lisinopril Other (See Comments)    headache     Outpatient Medications Prior to Visit  Medication Sig Dispense Refill   acetaminophen (TYLENOL) 650 MG CR tablet Take 1,300 mg by mouth every 8 (eight) hours as needed for pain. PRN     amLODipine (NORVASC) 5 MG tablet TAKE 2 TABLETS(10 MG) BY MOUTH DAILY 180 tablet 0   aspirin EC 81 MG tablet Take 81 mg by mouth at bedtime.      azithromycin (ZITHROMAX Z-PAK) 250 MG tablet Take 2 tabs today, then 1 tab until gone (Patient taking differently: Take 250 mg by mouth daily. Take 2 tablets today, then 1 tablet until finished.) 6 each 0   B-D ULTRAFINE III SHORT PEN 31G X 8 MM MISC USE UTD  0   BRILINTA 90 MG TABS tablet TAKE 1 TABLET(90  MG) BY MOUTH TWICE DAILY 60 tablet 0   Budeson-Glycopyrrol-Formoterol (BREZTRI AEROSPHERE) 160-9-4.8 MCG/ACT AERO Inhale 2 puffs into the lungs in the morning and at bedtime. 1 g 0   budesonide-formoterol (SYMBICORT) 160-4.5 MCG/ACT inhaler Inhale 2 puffs into the lungs in the morning and at bedtime. 10.2 g 6   busPIRone (BUSPAR) 15 MG tablet Take 30 mg by mouth 2 (two) times daily.     dapagliflozin propanediol (FARXIGA) 10 MG TABS tablet Take 1 tablet (10 mg total) by mouth daily before breakfast. 30 tablet 3   docusate sodium (COLACE) 100 MG capsule Take 400 mg by mouth at bedtime.     famotidine (PEPCID) 40 MG tablet  Take 40 mg by mouth daily.     fluticasone (FLONASE) 50 MCG/ACT nasal spray Place 1 spray into both nostrils daily.     furosemide (LASIX) 40 MG tablet Take 1 tablet (40 mg total) by mouth daily as needed for fluid or edema. 30 tablet 3   insulin detemir (LEVEMIR) 100 UNIT/ML injection Inject 0.16 mLs (16 Units total) into the skin 2 (two) times daily. (Patient taking differently: Inject 16-18 Units into the skin 2 (two) times daily.) 10 mL 1   insulin lispro (HUMALOG) 100 UNIT/ML injection Inject 0.05-0.09 mLs (5-9 Units total) into the skin 2 (two) times daily. 5 units at lunch, and 9 units after dinner. (Patient taking differently: Inject 5-15 Units into the skin 2 (two) times daily. 5-15 units as needed  (sliding scale insulin)) 10 mL 1   isosorbide mononitrate (IMDUR) 120 MG 24 hr tablet TAKE 1 TABLET(120 MG) BY MOUTH DAILY (Patient taking differently: Take 120 mg by mouth daily.) 90 tablet 3   levothyroxine (SYNTHROID, LEVOTHROID) 100 MCG tablet Take 100 mcg by mouth See admin instructions. Take 1 tablet (100 mcg) by mouth daily in the morning, except on Sundays     metoprolol (LOPRESSOR) 50 MG tablet Take 50 mg by mouth 2 (two) times daily.      montelukast (SINGULAIR) 10 MG tablet Take 1 tablet (10 mg total) by mouth at bedtime. 30 tablet 11   nitroGLYCERIN (NITROSTAT) 0.4 MG  SL tablet Place 1 tablet (0.4 mg total) under the tongue every 5 (five) minutes as needed for chest pain. 15 tablet 3   ONE TOUCH ULTRA TEST test strip      pantoprazole (PROTONIX) 40 MG tablet Take 40 mg by mouth at bedtime.      ranolazine (RANEXA) 500 MG 12 hr tablet Take 2 tablets by mouth twice daily (Patient taking differently: Take 500 mg by mouth 2 (two) times daily.) 180 tablet 1   rosuvastatin (CRESTOR) 20 MG tablet TAKE 1 TABLET BY MOUTH EVERY DAY. DISCONTINUE SIMVASTATIN (Patient taking differently: Take 20 mg by mouth daily.) 90 tablet 3   No facility-administered medications prior to visit.    Objective:   There were no vitals filed for this visit.    Physical Exam: Respiratory: Able to speak full sentences. No audible wheezing Psych: Normal mood, normal affect  Data Reviewed:  Imaging: CXR 10/30/15 S/p CABG. No infiltrate, effusion or edema CT Chest 01/09/21 - S/p CABG changes. Mild biapical pleuroparenchymal scarring. Scattered calcified granulomas. Subsegmental atelectasis in the lung bases. CXR 08/21/21 - No infiltrate, effusion or edema. S/p CABG. Mild left hemidiaphragm elevation  PFT: 12/29/20 FVC 3.63 (68%) FEV1 2.88 (73%) Ratio 76  TLC 75% DLCO 70% Interpretation: Mild restrictive defect with mildly reduced DLCO   Labs: Absolute Eos 10/30/15 200  Echocardiogram: 07/08/21 EF 30-35%, septal wall motion due to LBBB    Assessment & Plan:   Discussion: 75 year old male never smoker with COVID-19 long hauler, s/p CABG, chronic systolic heart failure, DM2, HTN and allergic rhinitis who presents for follow-up. Some improvement on triple therapy inhaler however discontinued due to cost. In active exacerbation with cough and shortness of breath.  COVID-19 long hauler manifested as wheezing and cough - worsening Mild pleuroparenchymal scarring and atelectasis --START azithromycin x 5 days --START prednisone taper --Consider restarting inhaler in future if lower  cost option available --CONTINUE montelukast 10 mg daily  Allergic Rhinitis --CONTINUE benadryl 25 mg twice daily --CONTINUE flonase (generic: fluticasone). One spray per nare  nightly  Health Maintenance Immunization History  Administered Date(s) Administered   DTaP 11/25/1994   Influenza, High Dose Seasonal PF 10/02/2014, 08/01/2015, 07/20/2016   Influenza, Quadrivalent, Recombinant, Inj, Pf 07/26/2017, 07/17/2019   Influenza,inj,Quad PF,6-35 Mos 01/23/2020   Influenza-Unspecified 10/11/2011, 12/04/2012, 09/24/2013, 08/15/2018   PFIZER Comirnaty(Gray Top)Covid-19 Tri-Sucrose Vaccine 04/21/2021   PFIZER(Purple Top)SARS-COV-2 Vaccination 01/02/2020, 01/23/2020, 08/11/2020   Pneumococcal Conjugate-13 05/24/2013   Pneumococcal Polysaccharide-23 06/12/2019   Tdap 05/24/2013   CT Lung Screen - not indicated  No orders of the defined types were placed in this encounter. Meds ordered this encounter  Medications   predniSONE (DELTASONE) 10 MG tablet    Sig: Take 4 tablets (40 mg total) by mouth daily with breakfast for 2 days, THEN 3 tablets (30 mg total) daily with breakfast for 2 days, THEN 2 tablets (20 mg total) daily with breakfast for 2 days, THEN 1 tablet (10 mg total) daily with breakfast for 2 days.    Dispense:  20 tablet    Refill:  0   azithromycin (ZITHROMAX) 250 MG tablet    Sig: Take 1 tablet (250 mg total) by mouth daily.    Dispense:  6 tablet    Refill:  0    No follow-ups on file.  I have spent a total time of 25-minutes on the day of the appointment reviewing prior documentation, coordinating care and discussing medical diagnosis and plan with the patient/family. Past medical history, allergies, medications were reviewed. Pertinent imaging, labs and tests included in this note have been reviewed and interpreted independently by me.  Harper Woods, MD [facility] 09/28/2021 1:32 PM  Office Number [phone]

## 2021-09-30 ENCOUNTER — Encounter: Payer: Self-pay | Admitting: Pulmonary Disease

## 2021-09-30 DIAGNOSIS — R053 Chronic cough: Secondary | ICD-10-CM | POA: Insufficient documentation

## 2021-10-02 ENCOUNTER — Other Ambulatory Visit: Payer: Self-pay | Admitting: Nephrology

## 2021-10-02 DIAGNOSIS — I129 Hypertensive chronic kidney disease with stage 1 through stage 4 chronic kidney disease, or unspecified chronic kidney disease: Secondary | ICD-10-CM | POA: Diagnosis not present

## 2021-10-02 DIAGNOSIS — N1832 Chronic kidney disease, stage 3b: Secondary | ICD-10-CM | POA: Diagnosis not present

## 2021-10-02 DIAGNOSIS — D631 Anemia in chronic kidney disease: Secondary | ICD-10-CM | POA: Diagnosis not present

## 2021-10-02 DIAGNOSIS — N189 Chronic kidney disease, unspecified: Secondary | ICD-10-CM

## 2021-10-02 DIAGNOSIS — N2581 Secondary hyperparathyroidism of renal origin: Secondary | ICD-10-CM

## 2021-10-06 DIAGNOSIS — R059 Cough, unspecified: Secondary | ICD-10-CM | POA: Diagnosis not present

## 2021-10-07 ENCOUNTER — Ambulatory Visit: Payer: Medicare Other | Admitting: Student

## 2021-10-12 ENCOUNTER — Telehealth: Payer: Self-pay | Admitting: Student

## 2021-10-13 NOTE — Progress Notes (Deleted)
Primary Physician:  Merrilee Seashore, MD   [name], male    DOB: 1946/02/10, 75 y.o.   MRN: [id number]  Subjective:    No chief complaint on file.   HPI: Jesus Young  is a 75 y.o. male  with known coronary artery disease s/p CABG in 2012,  left bundle branch block that was noted  2014, hypertension, diabetes, hyperlipidemia and chronic kidney disease. History of Covid 19 infection during March 2022.   Due to complaints of left arm pain with exertion, underwent lexiscan nuclear stress test on 08/07/2018 considered high risk study due to LVEF; however, on echocardiogram had normal LVEF.  No significant changes were noted compared to the 2014, but in view of continued symptoms despite medical management, he underwent coronary angiogram on 12/05/2018 and patient was noted to have small vessel and microvascular disease. Patient again presented with exertional symptoms, therefore underwent repeat echocardiogram and nuclear stress test 11/2020.  Stress test was high risk given low stress LVEF, however this was reviewed with Dr. Einar Gip who felt it was likely an error related to known left bundle branch block as echocardiogram at that time revealed LVEF 45-50%.  Due to worsening shortness of breath he underwent repeat echocardiogram 06/2021 which revealed LVEF had reduced further to 30-35%.  Patient then presented to [facility] emergency department with unstable angina 08/21/2021.  Patient subsequently underwent urgent left heart catheterization which revealed disease in SVG to RCA graft, underwent successful PCI and discharged home on aspirin and Brilinta.  Patient presents for 6-week follow-up.  At last office visit patient's blood pressure was mildly elevated and he had trace edema on exam, therefore had hoped to start [place], unfortunately renal function did not allow this as it has not recovered. ***  ***  Past Medical History:  Diagnosis Date   Anxiety    Arthritis     back and knees   Bronchitis    hx of 2015   Cancer ([place])    prostate   Cataract    immature on right   Chronic back pain    CKD (chronic kidney disease)    Constipation    takes Colace at bedtime   Coronary artery disease    takes Plavix daily but is on hold for surgery   Depression    Diabetes mellitus without complication (HCC)    takes Levemir and Humalog daily   Dizziness    Fall    hx of with injury to left shoulder    GERD (gastroesophageal reflux disease)    takes Protonix daily and Zantac   H/O urinary frequency    Hemorrhoids    History of bladder infections    History of kidney stones    Hyperlipidemia    takes Zocor every evening   Hypertension    takes Metoprolol daily   Hypothyroidism    takes Synthroid daily   Left bundle branch block    Myocardial infarction ([place])    many yrs before 2002   Night muscle spasms    takes Robaxin 4 x day    Nocturia    Past Surgical History:  Procedure Laterality Date   CARDIAC CATHETERIZATION  05/17/02   COLONOSCOPY     CORONARY ANGIOPLASTY     x 1    CORONARY ARTERY BYPASS GRAFT  2002   x 5   eyelid surgery      INGUINAL HERNIA REPAIR Left 05/06/2020   Procedure: LEFT INGUINAL HERNIA REPAIR;  Surgeon: Donnie Mesa, MD;  Location: [place];  Service: General;  Laterality: Left;   INSERTION OF MESH Left 05/06/2020   Procedure: INSERTION OF MESH;  Surgeon: Donnie Mesa, MD;  Location: [place];  Service: General;  Laterality: Left;   LEFT HEART CATH AND CORS/GRAFTS ANGIOGRAPHY N/A 12/05/2018   Procedure: LEFT HEART CATH AND CORS/GRAFTS ANGIOGRAPHY;  Surgeon: Adrian Prows, MD;  Location: [place] [facility];  Service: Cardiovascular;  Laterality: N/A;   LEFT HEART CATH AND CORS/GRAFTS ANGIOGRAPHY N/A 08/21/2021   Procedure: LEFT HEART CATH AND CORS/GRAFTS ANGIOGRAPHY;  Surgeon: Adrian Prows, MD;  Location: [place] [facility];  Service: Cardiovascular;  Laterality: N/A;   LUMBAR LAMINECTOMY/DECOMPRESSION MICRODISCECTOMY Bilateral  07/18/2013   Procedure: Bilateral Lumbar four-five Lumbar Laminotomy/Microdiskectomy;  Surgeon: Hosie Spangle, MD;  Location: [place] [facility];  Service: Neurosurgery;  Laterality: Bilateral;  Bilateral Lumbar four-five Lumbar Laminotomy/Microdiskectomy   NEPHROLITHOTOMY Left 06/23/2015   Procedure: NEPHROLITHOTOMY PERCUTANEOUS;  Surgeon: [name], MD;  Location: [facility];  Service: Urology;  Laterality: Left;   PROSTATECTOMY  10/2009   skin spots removed from back -precancerous     TONSILLECTOMY     Family History  Problem Relation Age of Onset   Heart disease Father    Heart attack Father    Thyroid disease Sister    Social History   Tobacco Use   Smoking status: Never   Smokeless tobacco: Never  Substance Use Topics   Alcohol use: No  Martial status: Married  ROS   Review of Systems  Constitutional: Negative for malaise/fatigue and weight gain.  Cardiovascular:  Negative for chest pain, claudication, dyspnea on exertion, leg swelling, near-syncope, orthopnea, palpitations, paroxysmal nocturnal dyspnea and syncope.  Respiratory:  Negative for shortness of breath.   Musculoskeletal:        Occasional mild left arm discomfort, improved following PCI     Objective:  There were no vitals taken for this visit. There is no height or weight on file to calculate BMI.   Physical Exam Vitals reviewed.  Constitutional:      General: He is not in acute distress.    Appearance: He is well-developed.  Cardiovascular:     Rate and Rhythm: Normal rate and regular rhythm.     Pulses: Intact distal pulses.          Carotid pulses are 2+ on the right side and 2+ on the left side.      Radial pulses are 2+ on the right side and 2+ on the left side.       Femoral pulses are 2+ on the right side and 2+ on the left side.      Popliteal pulses are 2+ on the right side and 2+ on the left side.       Dorsalis pedis pulses are 2+ on the right side and 2+ on the left side.       Posterior tibial  pulses are 2+ on the right side and 2+ on the left side.     Heart sounds: Normal heart sounds, S1 normal and S2 normal. No murmur heard.   No gallop.     Comments: Femoral access site with surrounding ecchymosis and small hematoma. No erythema, warmth, swelling, or bruit.  Pulmonary:     Effort: Pulmonary effort is normal. No accessory muscle usage or respiratory distress.     Breath sounds: Normal breath sounds. No wheezing, rhonchi or rales.  Musculoskeletal:        General:  Normal range of motion.     Cervical back: Normal range of motion.     Right lower leg: Edema (trace) present.     Left lower leg: Edema (trace) present.  Skin:    General: Skin is warm and dry.  Neurological:     Mental Status: He is alert.   Laboratory examination:    CMP Latest Ref Rng & Units 08/28/2021 08/22/2021 08/21/2021  Glucose 70 - 99 mg/dL 92 185(H) 199(H)  BUN 8 - 27 mg/dL 25 26(H) 34(H)  Creatinine 0.76 - 1.27 mg/dL 1.87(H) 1.80(H) 2.13(H)  Sodium 134 - 144 mmol/L 141 135 133(L)  Potassium 3.5 - 5.2 mmol/L 5.2 4.2 4.5  Chloride 96 - 106 mmol/L 103 107 102  CO2 20 - 29 mmol/L 21 19(L) 20(L)  Calcium 8.6 - 10.2 mg/dL 9.5 8.6(L) 9.3  Total Protein 6.5 - 8.1 g/dL - - -  Total Bilirubin 0.3 - 1.2 mg/dL - - -  Alkaline Phos 38 - 126 U/L - - -  AST 15 - 41 U/L - - -  ALT 17 - 63 U/L - - -   CBC Latest Ref Rng & Units 08/22/2021 08/21/2021 04/30/2020  WBC 4.0 - 10.5 K/uL 7.8 10.3 6.0  Hemoglobin 13.0 - 17.0 g/dL 15.9 16.0 16.0  Hematocrit 39.0 - 52.0 % 48.8 48.8 51.0  Platelets 150 - 400 K/uL 214 211 181   Lipid Panel  No results found for: CHOL, TRIG, HDL, CHOLHDL, VLDL, LDLCALC, LDLDIRECT HEMOGLOBIN A1C Lab Results  Component Value Date   HGBA1C 6.6 (H) 08/21/2021   MPG 142.72 08/21/2021   TSH No results for input(s): TSH in the last 8760 hours.  External Labs:   10/21/2020: HDL 33, LDL 70, total cholesterol 132, triglycerides 167 TSH 1.62 BUN 28  04/30/2020: A1c 6.9%, serum  creatinine 1.76  Allergies   Allergies  Allergen Reactions   Clonazepam Other (See Comments)    Headache    Lisinopril Other (See Comments)    headache    Medications Prior to Visit:   Outpatient Medications Prior to Visit  Medication Sig Dispense Refill   acetaminophen (TYLENOL) 650 MG CR tablet Take 1,300 mg by mouth every 8 (eight) hours as needed for pain. PRN     amLODipine (NORVASC) 5 MG tablet TAKE 2 TABLETS(10 MG) BY MOUTH DAILY 180 tablet 0   aspirin EC 81 MG tablet Take 81 mg by mouth at bedtime.      azithromycin (ZITHROMAX Z-PAK) 250 MG tablet Take 2 tabs today, then 1 tab until gone (Patient taking differently: Take 250 mg by mouth daily. Take 2 tablets today, then 1 tablet until finished.) 6 each 0   azithromycin (ZITHROMAX) 250 MG tablet Take 1 tablet (250 mg total) by mouth daily. 6 tablet 0   B-D ULTRAFINE III SHORT PEN 31G X 8 MM MISC USE UTD  0   BRILINTA 90 MG TABS tablet TAKE 1 TABLET(90 MG) BY MOUTH TWICE DAILY 60 tablet 0   Budeson-Glycopyrrol-Formoterol (BREZTRI AEROSPHERE) 160-9-4.8 MCG/ACT AERO Inhale 2 puffs into the lungs in the morning and at bedtime. 1 g 0   budesonide-formoterol (SYMBICORT) 160-4.5 MCG/ACT inhaler Inhale 2 puffs into the lungs in the morning and at bedtime. 10.2 g 6   busPIRone (BUSPAR) 15 MG tablet Take 30 mg by mouth 2 (two) times daily.     dapagliflozin propanediol (FARXIGA) 10 MG TABS tablet Take 1 tablet (10 mg total) by mouth daily before breakfast. 30 tablet 3   docusate  sodium (COLACE) 100 MG capsule Take 400 mg by mouth at bedtime.     famotidine (PEPCID) 40 MG tablet Take 40 mg by mouth daily.     fluticasone (FLONASE) 50 MCG/ACT nasal spray Place 1 spray into both nostrils daily.     furosemide (LASIX) 40 MG tablet Take 1 tablet (40 mg total) by mouth daily as needed for fluid or edema. 30 tablet 3   insulin detemir (LEVEMIR) 100 UNIT/ML injection Inject 0.16 mLs (16 Units total) into the skin 2 (two) times daily. (Patient  taking differently: Inject 16-18 Units into the skin 2 (two) times daily.) 10 mL 1   insulin lispro (HUMALOG) 100 UNIT/ML injection Inject 0.05-0.09 mLs (5-9 Units total) into the skin 2 (two) times daily. 5 units at lunch, and 9 units after dinner. (Patient taking differently: Inject 5-15 Units into the skin 2 (two) times daily. 5-15 units as needed  (sliding scale insulin)) 10 mL 1   isosorbide mononitrate (IMDUR) 120 MG 24 hr tablet TAKE 1 TABLET(120 MG) BY MOUTH DAILY (Patient taking differently: Take 120 mg by mouth daily.) 90 tablet 3   levothyroxine (SYNTHROID, LEVOTHROID) 100 MCG tablet Take 100 mcg by mouth See admin instructions. Take 1 tablet (100 mcg) by mouth daily in the morning, except on Sundays     metoprolol (LOPRESSOR) 50 MG tablet Take 50 mg by mouth 2 (two) times daily.      montelukast (SINGULAIR) 10 MG tablet Take 1 tablet (10 mg total) by mouth at bedtime. 30 tablet 11   nitroGLYCERIN (NITROSTAT) 0.4 MG SL tablet Place 1 tablet (0.4 mg total) under the tongue every 5 (five) minutes as needed for chest pain. 15 tablet 3   ONE TOUCH ULTRA TEST test strip      pantoprazole (PROTONIX) 40 MG tablet Take 40 mg by mouth at bedtime.      ranolazine (RANEXA) 500 MG 12 hr tablet Take 2 tablets by mouth twice daily (Patient taking differently: Take 500 mg by mouth 2 (two) times daily.) 180 tablet 1   rosuvastatin (CRESTOR) 20 MG tablet TAKE 1 TABLET BY MOUTH EVERY DAY. DISCONTINUE SIMVASTATIN (Patient taking differently: Take 20 mg by mouth daily.) 90 tablet 3   No facility-administered medications prior to visit.   Final Medications at End of Visit    No outpatient medications have been marked as taking for the 10/14/21 encounter (Appointment) with Rayetta Pigg, Milayah Krell C, PA-C.   Radiology:  No results found.  Cardiac Studies:    Coronary angiogram 12/05/2018: Left main diffusely diseased and occluded proximal LAD and circumflex pulmonary artery.  RCA occluded in the proximal  segment.  SVG to RCA is patent, diffusely diseased PL branch with high-grade stenosis of 90 to 99% and PDA has about a 60 to 70% mid stenosis.  Small vessels. LAD is diffusely diseased.  It is occluded in the proximal segment.  Supplied by LIMA to LAD.  Distal to the insertion, there is a 60 to 70% diffuse disease.  Small vessel distally. Free radial graft to OM1 is widely patent.  Distal circumflex has a high-grade lesion, small vessel. SVG to D1 is occluded. Normal LV systolic function, mild posterior hypokinesis.  Normal LVEDP.  EF estimated at 55%.   PCV MYOCARDIAL PERFUSION WITH LEXISCAN 11/24/2020 Lexiscan nuclear stress test performed using 1-day protocol. Rest and stress EKG showed sinus rhythm, IVCD/tachycardia. Normal myocardial perfusion. Stress LVEF 25%. High risk study due to low stress LVEF. Further discuss with Dr. Einar Gip, suspects that the  LVEF is an error, likely secondary to known LBBB.   Echocardiogram 07/08/2021:  Left ventricle cavity is mildly dilated. Mild concentric hypertrophy of the left ventricle. Moderate global hypokinesis. LVEF 30-35%. Abnormal septal wall motion due to left bundle branch block. Indeterminate diastolic filling pattern.  Moderate (Grade II) mitral regurgitation.  Mild tricuspid regurgitation.  Mild pulmonic regurgitation.  No evidence of pulmonary hypertension.  Compared to previous study on 11/18/2020, LVEF appears lower (reported 45-50% then).  Left Heart Catheterization 08/21/21: Comparison 11/24/2018 LV: 124/7, EDP 13 mmHg.  Ao 122/70, mean 92 mmHg.  No pressure gradient across the aortic valve. LM: Diffusely diseased with occluded proximal LAD and proximal circumflex with minor branches evident. RCA: Occluded in the midsegment and severely diseased proximally. SVG to RCA: Proximal to mid segment has a ulcerated 80 to 90% stenosis.  Native vessel itself in the PL branch has a diffuse 90% stenosis and PDA has 60 to 70% stenosis.  This is unchanged  from prior cardiac catheterization. Successful direct stenting with use of a spider filter wire for distal protection and implantation of a 4.0 x 16 mm Synergy XD at 16 atmospheric pressure, 60 seconds, stenosis reduced to 0%.  TIMI-3 to TIMI-3 flow. SVG to D1: Occluded. Free radial graft to OM1: Widely patent. LIMA to LAD: Widely patent.  There is mild to moderate disease in the native LAD which appears to have improved from prior cardiac catheterization.  Recommendation: Patient will be placed on dual antiplatelet therapy.  He will need to be on diuretics in the morning, please start [place] Clamp next week once renal function is stable.  He can potentially go home either tomorrow if renal function is stable and he is hemodynamically stable.  60 mL contrast utilized.  EKG:   08/27/2021: Sinus rhythm at a rate of 68 bpm.  Left atrial enlargement.  Left bundle branch block, no further analysis.  Compared to EKG 06/29/2021, no significant change.  Assessment:   No diagnosis found.   No orders of the defined types were placed in this encounter.  There are no discontinued medications.  Recommendations:   RIVEN MABILE  is a 75 y.o. with known coronary artery disease s/p CABG in 2012,  left bundle branch block that was noted  2014, hypertension, diabetes, hyperlipidemia and chronic kidney disease. History of Covid 19 infection during March 2022.   Due to complaints of left arm pain with exertion, underwent lexiscan nuclear stress test on 08/07/2018 considered high risk study due to LVEF; however, on echocardiogram had normal LVEF.  No significant changes were noted compared to the 2014, but in view of continued symptoms despite medical management, he underwent coronary angiogram on 12/05/2018 and patient was noted to have small vessel and microvascular disease. Patient again presented with exertional symptoms, therefore underwent repeat echocardiogram and nuclear stress test 11/2020.  Stress test  was high risk given low stress LVEF, however this was reviewed with Dr. Einar Gip who felt it was likely an error related to known left bundle branch block as echocardiogram at that time revealed LVEF 45-50%.  Due to worsening shortness of breath he underwent repeat echocardiogram 06/2021 which revealed LVEF had reduced further to 30-35%.  Patient then presented to [facility] emergency department with unstable angina 08/21/2021.  Patient subsequently underwent urgent left heart catheterization which revealed disease in SVG to RCA graft, underwent successful PCI and discharged home on aspirin and Brilinta.   Patient presents for 6-week follow-up.  At last office visit patient's blood pressure  was mildly elevated and he had trace edema on exam, therefore had hoped to start [place], unfortunately renal function did not allow this as it has not recovered. ***  ***  , which she is tolerating without bleeding diathesis, will continue this.  Patient has continued to have some residual mild left arm discomfort, significantly improved following PCI.  Patient's blood pressure is elevated and he has trace edema on exam.  We will repeat BMP and plan to start prandial if renal function allows.  We will plan to repeat echocardiogram in 3 months following PCI.  Follow-up in 6 weeks, sooner if needed for CAD, HFrEF.   Alethia Berthold, PA-C 10/13/2021, 2:24 PM Office: [phone]

## 2021-10-14 ENCOUNTER — Ambulatory Visit: Payer: Medicare Other | Admitting: Student

## 2021-10-14 DIAGNOSIS — I5022 Chronic systolic (congestive) heart failure: Secondary | ICD-10-CM

## 2021-10-14 DIAGNOSIS — I25708 Atherosclerosis of coronary artery bypass graft(s), unspecified, with other forms of angina pectoris: Secondary | ICD-10-CM

## 2021-10-22 ENCOUNTER — Other Ambulatory Visit: Payer: Self-pay | Admitting: Cardiology

## 2021-10-23 ENCOUNTER — Ambulatory Visit
Admission: RE | Admit: 2021-10-23 | Discharge: 2021-10-23 | Disposition: A | Discharge status: Home / Self Care | Payer: Medicare Other | Source: Ambulatory Visit | Attending: Nephrology | Admitting: Nephrology

## 2021-10-23 DIAGNOSIS — D631 Anemia in chronic kidney disease: Secondary | ICD-10-CM

## 2021-10-23 DIAGNOSIS — N1832 Chronic kidney disease, stage 3b: Secondary | ICD-10-CM

## 2021-10-23 DIAGNOSIS — I129 Hypertensive chronic kidney disease with stage 1 through stage 4 chronic kidney disease, or unspecified chronic kidney disease: Secondary | ICD-10-CM

## 2021-10-23 DIAGNOSIS — N2581 Secondary hyperparathyroidism of renal origin: Secondary | ICD-10-CM

## 2021-10-23 DIAGNOSIS — N281 Cyst of kidney, acquired: Secondary | ICD-10-CM | POA: Diagnosis not present

## 2021-10-23 DIAGNOSIS — N189 Chronic kidney disease, unspecified: Secondary | ICD-10-CM

## 2021-11-02 NOTE — Progress Notes (Signed)
Primary Physician:  Merrilee Seashore, MD   [name], male    DOB: [birth year], 75 y.o.   MRN: [id number]  Subjective:    Chief Complaint  Patient presents with   Coronary Artery Disease   Chronic systolic heart failure   Shortness of Breath    HPI: Jesus Young  is a 75 y.o. male  with known coronary artery disease s/p CABG in 2012,  left bundle branch block that was noted  2014, hypertension, diabetes, hyperlipidemia and chronic kidney disease. History of Covid 19 infection during March 2022.   Due to complaints of left arm pain with exertion, underwent lexiscan nuclear stress test on 08/07/2018 considered high risk study due to LVEF; however, on echocardiogram had normal LVEF.  No significant changes were noted compared to the 2014, but in view of continued symptoms despite medical management, he underwent coronary angiogram on 12/05/2018 and patient was noted to have small vessel and microvascular disease. Patient again presented with exertional symptoms, therefore underwent repeat echocardiogram and nuclear stress test 11/2020.  Stress test was high risk given low stress LVEF, however this was reviewed with Dr. Einar Gip who felt it was likely an error related to known left bundle branch block as echocardiogram at that time revealed LVEF 45-50%.  Due to worsening shortness of breath he underwent repeat echocardiogram 06/2021 which revealed LVEF had reduced further to 30-35%.  Patient then presented to [facility] emergency department with unstable angina 08/21/2021.  Patient subsequently underwent urgent left heart catheterization which revealed disease in SVG to RCA graft, underwent successful PCI and discharged home on aspirin and Brilinta.  Patient presents for 6-week follow-up.  At last office visit patient's blood pressure was mildly elevated and he had trace edema on exam, therefore had hoped to start [place], unfortunately renal function did not allow this as it  had not recovered.  Patient is feeling relatively well.  He does state 1 episode of chest/left arm pain which prompted him to take nitroglycerin.  He also notices occasional episodes of brief shortness of breath.  These tend to be with quick movements, like standing up.  However he is walking 10 to 15 minutes daily without issue, only mild dyspnea which has improved since cardiac catheterization.  Since last office visit he has seen Dr. Posey Pronto with nephrology, he will continue to follow him for management of CKD stage IIIb.  Patient has not taken Lasix since last office visit.  He is currently scheduled for repeat echocardiogram next month.  Past Medical History:  Diagnosis Date   Anxiety    Arthritis    back and knees   Bronchitis    hx of 2015   Cancer ([place])    prostate   Cataract    immature on right   Chronic back pain    CKD (chronic kidney disease)    Constipation    takes Colace at bedtime   Coronary artery disease    takes Plavix daily but is on hold for surgery   Depression    Diabetes mellitus without complication (HCC)    takes Levemir and Humalog daily   Dizziness    Fall    hx of with injury to left shoulder    GERD (gastroesophageal reflux disease)    takes Protonix daily and Zantac   H/O urinary frequency    Hemorrhoids    History of bladder infections    History of kidney stones    Hyperlipidemia  takes Zocor every evening   Hypertension    takes Metoprolol daily   Hypothyroidism    takes Synthroid daily   Left bundle branch block    Myocardial infarction [facility])    many yrs before 2002   Night muscle spasms    takes Robaxin 4 x day    Nocturia    Past Surgical History:  Procedure Laterality Date   CARDIAC CATHETERIZATION  05/17/02   COLONOSCOPY     CORONARY ANGIOPLASTY     x 1    CORONARY ARTERY BYPASS GRAFT  2002   x 5   eyelid surgery      INGUINAL HERNIA REPAIR Left 05/06/2020   Procedure: LEFT INGUINAL HERNIA REPAIR;  Surgeon: Donnie Mesa,  MD;  Location: [place];  Service: General;  Laterality: Left;   INSERTION OF MESH Left 05/06/2020   Procedure: INSERTION OF MESH;  Surgeon: Donnie Mesa, MD;  Location: [place];  Service: General;  Laterality: Left;   LEFT HEART CATH AND CORS/GRAFTS ANGIOGRAPHY N/A 12/05/2018   Procedure: LEFT HEART CATH AND CORS/GRAFTS ANGIOGRAPHY;  Surgeon: Adrian Prows, MD;  Location: [place] CV LAB;  Service: Cardiovascular;  Laterality: N/A;   LEFT HEART CATH AND CORS/GRAFTS ANGIOGRAPHY N/A 08/21/2021   Procedure: LEFT HEART CATH AND CORS/GRAFTS ANGIOGRAPHY;  Surgeon: Adrian Prows, MD;  Location: [place] CV LAB;  Service: Cardiovascular;  Laterality: N/A;   LUMBAR LAMINECTOMY/DECOMPRESSION MICRODISCECTOMY Bilateral 07/18/2013   Procedure: Bilateral Lumbar four-five Lumbar Laminotomy/Microdiskectomy;  Surgeon: Hosie Spangle, MD;  Location: [place] [facility];  Service: Neurosurgery;  Laterality: Bilateral;  Bilateral Lumbar four-five Lumbar Laminotomy/Microdiskectomy   NEPHROLITHOTOMY Left 06/23/2015   Procedure: NEPHROLITHOTOMY PERCUTANEOUS;  Surgeon: [name], MD;  Location: [facility];  Service: Urology;  Laterality: Left;   PROSTATECTOMY  10/2009   skin spots removed from back -precancerous     TONSILLECTOMY     Family History  Problem Relation Age of Onset   Heart disease Father    Heart attack Father    Thyroid disease Sister    Social History   Tobacco Use   Smoking status: Never   Smokeless tobacco: Never  Substance Use Topics   Alcohol use: No  Martial status: Married  ROS   Review of Systems  Constitutional: Negative for malaise/fatigue and weight gain.  Cardiovascular:  Positive for chest pain (single epsiode). Negative for claudication, dyspnea on exertion, leg swelling, near-syncope, orthopnea, palpitations, paroxysmal nocturnal dyspnea and syncope.  Respiratory:  Positive for shortness of breath (occasional, non-exertional).   Musculoskeletal:        Occasional mild left arm  discomfort, improved following PCI     Objective:  Blood pressure 127/74, pulse 66, temperature 97.8 F (36.6 C), temperature source Temporal, height 6\' 2"  (1.88 m), weight 188 lb (85.3 kg), SpO2 99 %. Body mass index is 24.14 kg/m.   Physical Exam Vitals reviewed.  Constitutional:      General: He is not in acute distress.    Appearance: He is well-developed.  Cardiovascular:     Rate and Rhythm: Normal rate and regular rhythm.     Pulses: Intact distal pulses.          Carotid pulses are 2+ on the right side and 2+ on the left side.      Radial pulses are 2+ on the right side and 2+ on the left side.       Femoral pulses are 2+ on the right side and 2+ on the left side.  Popliteal pulses are 2+ on the right side and 2+ on the left side.       Dorsalis pedis pulses are 2+ on the right side and 2+ on the left side.       Posterior tibial pulses are 2+ on the right side and 2+ on the left side.     Heart sounds: Normal heart sounds, S1 normal and S2 normal. No murmur heard.   No gallop.  Pulmonary:     Effort: Pulmonary effort is normal. No accessory muscle usage or respiratory distress.     Breath sounds: Normal breath sounds. No wheezing, rhonchi or rales.  Musculoskeletal:        General: Normal range of motion.     Cervical back: Normal range of motion.     Right lower leg: No edema.     Left lower leg: No edema.  Neurological:     Mental Status: He is alert.   Laboratory examination:    CMP Latest Ref Rng & Units 08/28/2021 08/22/2021 08/21/2021  Glucose 70 - 99 mg/dL 92 185(H) 199(H)  BUN 8 - 27 mg/dL 25 26(H) 34(H)  Creatinine 0.76 - 1.27 mg/dL 1.87(H) 1.80(H) 2.13(H)  Sodium 134 - 144 mmol/L 141 135 133(L)  Potassium 3.5 - 5.2 mmol/L 5.2 4.2 4.5  Chloride 96 - 106 mmol/L 103 107 102  CO2 20 - 29 mmol/L 21 19(L) 20(L)  Calcium 8.6 - 10.2 mg/dL 9.5 8.6(L) 9.3  Total Protein 6.5 - 8.1 g/dL - - -  Total Bilirubin 0.3 - 1.2 mg/dL - - -  Alkaline Phos 38 - 126 U/L -  - -  AST 15 - 41 U/L - - -  ALT 17 - 63 U/L - - -   CBC Latest Ref Rng & Units 08/22/2021 08/21/2021 04/30/2020  WBC 4.0 - 10.5 K/uL 7.8 10.3 6.0  Hemoglobin 13.0 - 17.0 g/dL 15.9 16.0 16.0  Hematocrit 39.0 - 52.0 % 48.8 48.8 51.0  Platelets 150 - 400 K/uL 214 211 181   Lipid Panel  No results found for: CHOL, TRIG, HDL, CHOLHDL, VLDL, LDLCALC, LDLDIRECT HEMOGLOBIN A1C Lab Results  Component Value Date   HGBA1C 6.6 (H) 08/21/2021   MPG 142.72 08/21/2021   TSH No results for input(s): TSH in the last 8760 hours.  External Labs:  10/02/2021: BUN 33, creatinine 1.84, GFR 38, sodium 137, potassium 4.6 Hgb 17.3, HCT 51.5, MCV 94, platelet 216  10/21/2020: HDL 33, LDL 70, total cholesterol 132, triglycerides 167 TSH 1.62 BUN 28  04/30/2020: A1c 6.9%, serum creatinine 1.76  Allergies   Allergies  Allergen Reactions   Tetanus Antitoxin Other (See Comments)   Clonazepam Other (See Comments)    Headache    Lisinopril Other (See Comments)    headache    Medications Prior to Visit:   Outpatient Medications Prior to Visit  Medication Sig Dispense Refill   acetaminophen (TYLENOL) 650 MG CR tablet Take 1,300 mg by mouth in the morning and at bedtime.     amLODipine (NORVASC) 5 MG tablet TAKE 2 TABLETS(10 MG) BY MOUTH DAILY 180 tablet 0   aspirin EC 81 MG tablet Take 81 mg by mouth at bedtime.      B-D ULTRAFINE III SHORT PEN 31G X 8 MM MISC USE UTD  0   BRILINTA 90 MG TABS tablet TAKE 1 TABLET(90 MG) BY MOUTH TWICE DAILY 60 tablet 0   busPIRone (BUSPAR) 15 MG tablet Take 30 mg by mouth 2 (two) times daily.  dapagliflozin propanediol (FARXIGA) 10 MG TABS tablet Take 1 tablet (10 mg total) by mouth daily before breakfast. 30 tablet 3   docusate sodium (COLACE) 100 MG capsule Take 400 mg by mouth at bedtime.     famotidine (PEPCID) 40 MG tablet Take 40 mg by mouth daily.     furosemide (LASIX) 40 MG tablet Take 1 tablet (40 mg total) by mouth daily as needed for fluid or edema.  30 tablet 3   insulin detemir (LEVEMIR) 100 UNIT/ML injection Inject 0.16 mLs (16 Units total) into the skin 2 (two) times daily. (Patient taking differently: Inject 16-18 Units into the skin 2 (two) times daily.) 10 mL 1   insulin lispro (HUMALOG) 100 UNIT/ML injection Inject 0.05-0.09 mLs (5-9 Units total) into the skin 2 (two) times daily. 5 units at lunch, and 9 units after dinner. (Patient taking differently: Inject 5-15 Units into the skin 2 (two) times daily. 5-15 units as needed  (sliding scale insulin)) 10 mL 1   isosorbide mononitrate (IMDUR) 120 MG 24 hr tablet TAKE 1 TABLET(120 MG) BY MOUTH DAILY (Patient taking differently: Take 120 mg by mouth daily.) 90 tablet 3   levothyroxine (SYNTHROID, LEVOTHROID) 100 MCG tablet Take 100 mcg by mouth See admin instructions. Take 1 tablet (100 mcg) by mouth daily in the morning, except on Sundays     metoprolol (LOPRESSOR) 50 MG tablet Take 50 mg by mouth 2 (two) times daily.      montelukast (SINGULAIR) 10 MG tablet Take 1 tablet (10 mg total) by mouth at bedtime. 30 tablet 11   nitroGLYCERIN (NITROSTAT) 0.4 MG SL tablet Place 1 tablet (0.4 mg total) under the tongue every 5 (five) minutes as needed for chest pain. 15 tablet 3   ONE TOUCH ULTRA TEST test strip      pantoprazole (PROTONIX) 40 MG tablet Take 40 mg by mouth at bedtime.      ranolazine (RANEXA) 500 MG 12 hr tablet Take 2 tablets by mouth twice daily (Patient taking differently: Take 500 mg by mouth 2 (two) times daily.) 180 tablet 1   rosuvastatin (CRESTOR) 20 MG tablet TAKE 1 TABLET BY MOUTH EVERY DAY. DISCONTINUE SIMVASTATIN (Patient taking differently: Take 20 mg by mouth daily.) 90 tablet 3   fluticasone (FLONASE) 50 MCG/ACT nasal spray Place 1 spray into both nostrils daily.     azithromycin (ZITHROMAX Z-PAK) 250 MG tablet Take 2 tabs today, then 1 tab until gone (Patient taking differently: Take 250 mg by mouth daily. Take 2 tablets today, then 1 tablet until finished.) 6 each 0    azithromycin (ZITHROMAX) 250 MG tablet Take 1 tablet (250 mg total) by mouth daily. 6 tablet 0   Budeson-Glycopyrrol-Formoterol (BREZTRI AEROSPHERE) 160-9-4.8 MCG/ACT AERO Inhale 2 puffs into the lungs in the morning and at bedtime. (Patient not taking: Reported on 11/03/2021) 1 g 0   budesonide-formoterol (SYMBICORT) 160-4.5 MCG/ACT inhaler Inhale 2 puffs into the lungs in the morning and at bedtime. 10.2 g 6   No facility-administered medications prior to visit.   Final Medications at End of Visit    Current Meds  Medication Sig   acetaminophen (TYLENOL) 650 MG CR tablet Take 1,300 mg by mouth in the morning and at bedtime.   amLODipine (NORVASC) 5 MG tablet TAKE 2 TABLETS(10 MG) BY MOUTH DAILY   aspirin EC 81 MG tablet Take 81 mg by mouth at bedtime.    B-D ULTRAFINE III SHORT PEN 31G X 8 MM MISC USE UTD  BRILINTA 90 MG TABS tablet TAKE 1 TABLET(90 MG) BY MOUTH TWICE DAILY   busPIRone (BUSPAR) 15 MG tablet Take 30 mg by mouth 2 (two) times daily.   dapagliflozin propanediol (FARXIGA) 10 MG TABS tablet Take 1 tablet (10 mg total) by mouth daily before breakfast.   docusate sodium (COLACE) 100 MG capsule Take 400 mg by mouth at bedtime.   famotidine (PEPCID) 40 MG tablet Take 40 mg by mouth daily.   furosemide (LASIX) 40 MG tablet Take 1 tablet (40 mg total) by mouth daily as needed for fluid or edema.   insulin detemir (LEVEMIR) 100 UNIT/ML injection Inject 0.16 mLs (16 Units total) into the skin 2 (two) times daily. (Patient taking differently: Inject 16-18 Units into the skin 2 (two) times daily.)   insulin lispro (HUMALOG) 100 UNIT/ML injection Inject 0.05-0.09 mLs (5-9 Units total) into the skin 2 (two) times daily. 5 units at lunch, and 9 units after dinner. (Patient taking differently: Inject 5-15 Units into the skin 2 (two) times daily. 5-15 units as needed  (sliding scale insulin))   isosorbide mononitrate (IMDUR) 120 MG 24 hr tablet TAKE 1 TABLET(120 MG) BY MOUTH DAILY (Patient  taking differently: Take 120 mg by mouth daily.)   levothyroxine (SYNTHROID, LEVOTHROID) 100 MCG tablet Take 100 mcg by mouth See admin instructions. Take 1 tablet (100 mcg) by mouth daily in the morning, except on Sundays   metoprolol (LOPRESSOR) 50 MG tablet Take 50 mg by mouth 2 (two) times daily.    montelukast (SINGULAIR) 10 MG tablet Take 1 tablet (10 mg total) by mouth at bedtime.   nitroGLYCERIN (NITROSTAT) 0.4 MG SL tablet Place 1 tablet (0.4 mg total) under the tongue every 5 (five) minutes as needed for chest pain.   ONE TOUCH ULTRA TEST test strip    pantoprazole (PROTONIX) 40 MG tablet Take 40 mg by mouth at bedtime.    ranolazine (RANEXA) 500 MG 12 hr tablet Take 2 tablets by mouth twice daily (Patient taking differently: Take 500 mg by mouth 2 (two) times daily.)   rosuvastatin (CRESTOR) 20 MG tablet TAKE 1 TABLET BY MOUTH EVERY DAY. DISCONTINUE SIMVASTATIN (Patient taking differently: Take 20 mg by mouth daily.)   [DISCONTINUED] fluticasone (FLONASE) 50 MCG/ACT nasal spray Place 1 spray into both nostrils daily.   Radiology:  No results found.  Cardiac Studies:    Coronary angiogram 12/05/2018: Left main diffusely diseased and occluded proximal LAD and circumflex pulmonary artery.  RCA occluded in the proximal segment.  SVG to RCA is patent, diffusely diseased PL branch with high-grade stenosis of 90 to 99% and PDA has about a 60 to 70% mid stenosis.  Small vessels. LAD is diffusely diseased.  It is occluded in the proximal segment.  Supplied by LIMA to LAD.  Distal to the insertion, there is a 60 to 70% diffuse disease.  Small vessel distally. Free radial graft to OM1 is widely patent.  Distal circumflex has a high-grade lesion, small vessel. SVG to D1 is occluded. Normal LV systolic function, mild posterior hypokinesis.  Normal LVEDP.  EF estimated at 55%.   PCV MYOCARDIAL PERFUSION WITH LEXISCAN 11/24/2020 Lexiscan nuclear stress test performed using 1-day protocol. Rest  and stress EKG showed sinus rhythm, IVCD/tachycardia. Normal myocardial perfusion. Stress LVEF 25%. High risk study due to low stress LVEF. Further discuss with Dr. Einar Gip, suspects that the LVEF is an error, likely secondary to known LBBB.   Echocardiogram 07/08/2021:  Left ventricle cavity is mildly dilated. Mild concentric  hypertrophy of the left ventricle. Moderate global hypokinesis. LVEF 30-35%. Abnormal septal wall motion due to left bundle branch block. Indeterminate diastolic filling pattern.  Moderate (Grade II) mitral regurgitation.  Mild tricuspid regurgitation.  Mild pulmonic regurgitation.  No evidence of pulmonary hypertension.  Compared to previous study on 11/18/2020, LVEF appears lower (reported 45-50% then).  Left Heart Catheterization 08/21/21: Comparison 11/24/2018 LV: 124/7, EDP 13 mmHg.  Ao 122/70, mean 92 mmHg.  No pressure gradient across the aortic valve. LM: Diffusely diseased with occluded proximal LAD and proximal circumflex with minor branches evident. RCA: Occluded in the midsegment and severely diseased proximally. SVG to RCA: Proximal to mid segment has a ulcerated 80 to 90% stenosis.  Native vessel itself in the PL branch has a diffuse 90% stenosis and PDA has 60 to 70% stenosis.  This is unchanged from prior cardiac catheterization. Successful direct stenting with use of a spider filter wire for distal protection and implantation of a 4.0 x 16 mm Synergy XD at 16 atmospheric pressure, 60 seconds, stenosis reduced to 0%.  TIMI-3 to TIMI-3 flow. SVG to D1: Occluded. Free radial graft to OM1: Widely patent. LIMA to LAD: Widely patent.  There is mild to moderate disease in the native LAD which appears to have improved from prior cardiac catheterization.  Recommendation: Patient will be placed on dual antiplatelet therapy.  He will need to be on diuretics in the morning, please start Carrington Clamp next week once renal function is stable.  He can potentially go home either  tomorrow if renal function is stable and he is hemodynamically stable.  60 mL contrast utilized.  EKG:   08/27/2021: Sinus rhythm at a rate of 68 bpm.  Left atrial enlargement.  Left bundle branch block, no further analysis.  Compared to EKG 06/29/2021, no significant change.  Assessment:     ICD-10-CM   1. Coronary artery disease of bypass graft of native heart with stable angina pectoris (HCC)  I25.708     2. Chronic systolic (congestive) heart failure (HCC)  I50.22     3. CKD stage 3 due to type 2 diabetes mellitus ([place])  E11.22    N18.30       No orders of the defined types were placed in this encounter.   Medications Discontinued During This Encounter  Medication Reason   azithromycin (ZITHROMAX Z-PAK) 250 MG tablet    azithromycin (ZITHROMAX) 250 MG tablet    Budeson-Glycopyrrol-Formoterol (BREZTRI AEROSPHERE) 160-9-4.8 MCG/ACT AERO    budesonide-formoterol (SYMBICORT) 160-4.5 MCG/ACT inhaler    fluticasone (FLONASE) 50 MCG/ACT nasal spray     Recommendations:   Jesus Young  is a 75 y.o. with known coronary artery disease s/p CABG in 2012,  left bundle branch block that was noted  2014, hypertension, diabetes, hyperlipidemia and chronic kidney disease. History of Covid 19 infection during March 2022.   Due to complaints of left arm pain with exertion, underwent lexiscan nuclear stress test on 08/07/2018 considered high risk study due to LVEF; however, on echocardiogram had normal LVEF.  No significant changes were noted compared to the 2014, but in view of continued symptoms despite medical management, he underwent coronary angiogram on 12/05/2018 and patient was noted to have small vessel and microvascular disease. Patient again presented with exertional symptoms, therefore underwent repeat echocardiogram and nuclear stress test 11/2020.  Stress test was high risk given low stress LVEF, however this was reviewed with Dr. Einar Gip who felt it was likely an error related to  known left bundle  branch block as echocardiogram at that time revealed LVEF 45-50%.  Due to worsening shortness of breath he underwent repeat echocardiogram 06/2021 which revealed LVEF had reduced further to 30-35%.  Patient then presented to [facility] emergency department with unstable angina 08/21/2021.  Patient subsequently underwent urgent left heart catheterization which revealed disease in SVG to RCA graft, underwent successful PCI and discharged home on aspirin and Brilinta.   Patient presents for 6-week follow-up.  At last office visit patient's blood pressure was mildly elevated and he had trace edema on exam, therefore had hoped to start [place], unfortunately renal function did not allow this as it has not recovered.  Renal and has now stabilized with baseline CKD, stage IIIb.  We will therefore start valsartan 40 mg once daily repeat BMP in 1 week.  Patient has repeat echocardiogram scheduled next month to reevaluate LVEF.  We will reconsider how aggressive we need to uptitrate guideline directed medical therapy based on repeat echocardiogram.  In regard to patient's anginal symptoms, now stable.  Suspect his shortness of breath is related to underlying anxiety after recent ACS.  He has recently started BuSpar, which has mildly improved his symptoms.  We will obtain BNP to evaluate for heart failure as underlying etiology of shortness of breath, although my suspicion is low as patient is euvolemic on exam.  Could consider up titration of antianxiety treatment if BNP is normal.  Follow-up in 3 months, sooner if needed.   Alethia Berthold, PA-C 11/03/2021, 12:42 PM Office: [phone]

## 2021-11-03 ENCOUNTER — Other Ambulatory Visit: Payer: Self-pay

## 2021-11-03 ENCOUNTER — Encounter: Payer: Self-pay | Admitting: Student

## 2021-11-03 ENCOUNTER — Ambulatory Visit: Payer: Medicare Other | Admitting: Student

## 2021-11-03 VITALS — BP 127/74 | HR 66 | Temp 97.8°F | Ht 74.0 in | Wt 188.0 lb

## 2021-11-03 DIAGNOSIS — I25708 Atherosclerosis of coronary artery bypass graft(s), unspecified, with other forms of angina pectoris: Secondary | ICD-10-CM

## 2021-11-03 DIAGNOSIS — E1122 Type 2 diabetes mellitus with diabetic chronic kidney disease: Secondary | ICD-10-CM | POA: Diagnosis not present

## 2021-11-03 DIAGNOSIS — N183 Chronic kidney disease, stage 3 unspecified: Secondary | ICD-10-CM | POA: Diagnosis not present

## 2021-11-03 DIAGNOSIS — I5022 Chronic systolic (congestive) heart failure: Secondary | ICD-10-CM

## 2021-11-03 MED ORDER — VALSARTAN 40 MG PO TABS: 40.0000 mg | ORAL_TABLET | Freq: Every day | ORAL | 3 refills | Status: DC

## 2021-11-05 ENCOUNTER — Other Ambulatory Visit: Payer: Self-pay | Admitting: Student

## 2021-11-05 DIAGNOSIS — I25118 Atherosclerotic heart disease of native coronary artery with other forms of angina pectoris: Secondary | ICD-10-CM

## 2021-11-12 DIAGNOSIS — I5022 Chronic systolic (congestive) heart failure: Secondary | ICD-10-CM | POA: Diagnosis not present

## 2021-11-12 DIAGNOSIS — E1122 Type 2 diabetes mellitus with diabetic chronic kidney disease: Secondary | ICD-10-CM | POA: Diagnosis not present

## 2021-11-12 DIAGNOSIS — N183 Chronic kidney disease, stage 3 unspecified: Secondary | ICD-10-CM | POA: Diagnosis not present

## 2021-11-13 LAB — BASIC METABOLIC PANEL
BUN/Creatinine Ratio: 17 (ref 10–24)
BUN: 34 mg/dL — ABNORMAL HIGH (ref 8–27)
CO2: 23 mmol/L (ref 20–29)
Calcium: 9.5 mg/dL (ref 8.6–10.2)
Chloride: 106 mmol/L (ref 96–106)
Creatinine, Ser: 2.02 mg/dL — ABNORMAL HIGH (ref 0.76–1.27)
Glucose: 71 mg/dL (ref 70–99)
Potassium: 4.9 mmol/L (ref 3.5–5.2)
Sodium: 140 mmol/L (ref 134–144)
eGFR: 34 mL/min/{1.73_m2} — ABNORMAL LOW (ref >59)

## 2021-11-13 LAB — BRAIN NATRIURETIC PEPTIDE: BNP: 431.4 pg/mL — ABNORMAL HIGH (ref 0.0–100.0)

## 2021-11-17 ENCOUNTER — Telehealth: Payer: Self-pay | Admitting: Student

## 2021-11-17 DIAGNOSIS — N1832 Chronic kidney disease, stage 3b: Secondary | ICD-10-CM | POA: Diagnosis not present

## 2021-11-18 NOTE — Progress Notes (Signed)
Called and spoke to pt, pt voiced understanding.

## 2021-11-21 ENCOUNTER — Other Ambulatory Visit: Payer: Self-pay | Admitting: Cardiology

## 2021-11-23 ENCOUNTER — Other Ambulatory Visit: Payer: Self-pay

## 2021-11-23 DIAGNOSIS — N183 Chronic kidney disease, stage 3 unspecified: Secondary | ICD-10-CM | POA: Diagnosis not present

## 2021-11-23 DIAGNOSIS — E1122 Type 2 diabetes mellitus with diabetic chronic kidney disease: Secondary | ICD-10-CM | POA: Diagnosis not present

## 2021-11-24 ENCOUNTER — Encounter: Payer: Self-pay | Admitting: Podiatry

## 2021-11-24 ENCOUNTER — Ambulatory Visit: Payer: Medicare Other | Admitting: Podiatry

## 2021-11-24 ENCOUNTER — Other Ambulatory Visit: Payer: Self-pay

## 2021-11-24 DIAGNOSIS — J432 Centrilobular emphysema: Secondary | ICD-10-CM | POA: Insufficient documentation

## 2021-11-24 DIAGNOSIS — M79676 Pain in unspecified toe(s): Secondary | ICD-10-CM

## 2021-11-24 DIAGNOSIS — E119 Type 2 diabetes mellitus without complications: Secondary | ICD-10-CM | POA: Diagnosis not present

## 2021-11-24 DIAGNOSIS — F419 Anxiety disorder, unspecified: Secondary | ICD-10-CM | POA: Insufficient documentation

## 2021-11-24 DIAGNOSIS — N401 Enlarged prostate with lower urinary tract symptoms: Secondary | ICD-10-CM | POA: Insufficient documentation

## 2021-11-24 DIAGNOSIS — D2371 Other benign neoplasm of skin of right lower limb, including hip: Secondary | ICD-10-CM | POA: Diagnosis not present

## 2021-11-24 DIAGNOSIS — D2372 Other benign neoplasm of skin of left lower limb, including hip: Secondary | ICD-10-CM | POA: Diagnosis not present

## 2021-11-24 DIAGNOSIS — M5136 Other intervertebral disc degeneration, lumbar region: Secondary | ICD-10-CM | POA: Insufficient documentation

## 2021-11-24 DIAGNOSIS — D689 Coagulation defect, unspecified: Secondary | ICD-10-CM

## 2021-11-24 DIAGNOSIS — Z0000 Encounter for general adult medical examination without abnormal findings: Secondary | ICD-10-CM | POA: Insufficient documentation

## 2021-11-24 DIAGNOSIS — E7800 Pure hypercholesterolemia, unspecified: Secondary | ICD-10-CM | POA: Insufficient documentation

## 2021-11-24 DIAGNOSIS — B351 Tinea unguium: Secondary | ICD-10-CM | POA: Diagnosis not present

## 2021-11-24 DIAGNOSIS — I129 Hypertensive chronic kidney disease with stage 1 through stage 4 chronic kidney disease, or unspecified chronic kidney disease: Secondary | ICD-10-CM | POA: Insufficient documentation

## 2021-11-24 DIAGNOSIS — K2100 Gastro-esophageal reflux disease with esophagitis, without bleeding: Secondary | ICD-10-CM | POA: Insufficient documentation

## 2021-11-24 DIAGNOSIS — K589 Irritable bowel syndrome without diarrhea: Secondary | ICD-10-CM | POA: Insufficient documentation

## 2021-11-24 DIAGNOSIS — C61 Malignant neoplasm of prostate: Secondary | ICD-10-CM | POA: Insufficient documentation

## 2021-11-24 DIAGNOSIS — E039 Hypothyroidism, unspecified: Secondary | ICD-10-CM | POA: Insufficient documentation

## 2021-11-24 DIAGNOSIS — E1165 Type 2 diabetes mellitus with hyperglycemia: Secondary | ICD-10-CM | POA: Insufficient documentation

## 2021-11-24 DIAGNOSIS — M51369 Other intervertebral disc degeneration, lumbar region without mention of lumbar back pain or lower extremity pain: Secondary | ICD-10-CM | POA: Insufficient documentation

## 2021-11-24 LAB — BMP8+EGFR
BUN/Creatinine Ratio: 14 (ref 10–24)
BUN: 32 mg/dL — ABNORMAL HIGH (ref 8–27)
CO2: 21 mmol/L (ref 20–29)
Calcium: 9.4 mg/dL (ref 8.6–10.2)
Chloride: 101 mmol/L (ref 96–106)
Creatinine, Ser: 2.23 mg/dL — ABNORMAL HIGH (ref 0.76–1.27)
Glucose: 187 mg/dL — ABNORMAL HIGH (ref 70–99)
Potassium: 4.8 mmol/L (ref 3.5–5.2)
Sodium: 137 mmol/L (ref 134–144)
eGFR: 30 mL/min/{1.73_m2} — ABNORMAL LOW (ref >59)

## 2021-11-24 NOTE — Progress Notes (Signed)
He presents today chief complaint of painful lesions and painful elongated toenails.  Objective: Vital signs are stable he is alert and oriented x3 pulses are strong palpable bilateral neurologic sensorium is intact he has normal muscle strength his toenails are long thick yellow dystrophic-like mycotic particularly hallux bilaterally are very thick.  He also has some mild reactive hyperkeratosis plantar aspect bilateral foot no ulcerations.  Assessment: Pain in limb secondary to onychomycosis and benign skin lesions diabetes without complications.  Plan: Debrided benign skin lesions and debrided nails 1 through 5 bilateral.  Follow-up with him in 3 months

## 2021-11-26 ENCOUNTER — Other Ambulatory Visit: Payer: Self-pay

## 2021-11-26 ENCOUNTER — Ambulatory Visit: Payer: Medicare Other

## 2021-11-26 DIAGNOSIS — I5022 Chronic systolic (congestive) heart failure: Secondary | ICD-10-CM

## 2021-11-27 ENCOUNTER — Other Ambulatory Visit: Payer: Self-pay | Admitting: Student

## 2021-11-27 DIAGNOSIS — E1122 Type 2 diabetes mellitus with diabetic chronic kidney disease: Secondary | ICD-10-CM

## 2021-11-27 NOTE — Progress Notes (Signed)
Called pt no answer, could not leave a vm due of it being full

## 2021-11-30 NOTE — Progress Notes (Signed)
Tried calling patient no answer wasn't able to leave vm

## 2021-12-01 NOTE — Progress Notes (Signed)
Called and spoke to pts wife, she voiced understanding and will relay the message to the pt.

## 2021-12-04 DIAGNOSIS — Z961 Presence of intraocular lens: Secondary | ICD-10-CM | POA: Diagnosis not present

## 2021-12-04 DIAGNOSIS — E119 Type 2 diabetes mellitus without complications: Secondary | ICD-10-CM | POA: Diagnosis not present

## 2021-12-13 ENCOUNTER — Other Ambulatory Visit: Payer: Self-pay | Admitting: Student

## 2021-12-13 DIAGNOSIS — E782 Mixed hyperlipidemia: Secondary | ICD-10-CM

## 2021-12-21 ENCOUNTER — Other Ambulatory Visit: Payer: Self-pay | Admitting: Student

## 2021-12-21 DIAGNOSIS — I25118 Atherosclerotic heart disease of native coronary artery with other forms of angina pectoris: Secondary | ICD-10-CM

## 2021-12-23 ENCOUNTER — Other Ambulatory Visit: Payer: Self-pay | Admitting: Cardiology

## 2021-12-28 DIAGNOSIS — I25118 Atherosclerotic heart disease of native coronary artery with other forms of angina pectoris: Secondary | ICD-10-CM | POA: Diagnosis not present

## 2021-12-28 DIAGNOSIS — R5383 Other fatigue: Secondary | ICD-10-CM | POA: Diagnosis not present

## 2021-12-28 DIAGNOSIS — I700 Atherosclerosis of aorta: Secondary | ICD-10-CM | POA: Diagnosis not present

## 2021-12-28 DIAGNOSIS — E1121 Type 2 diabetes mellitus with diabetic nephropathy: Secondary | ICD-10-CM | POA: Diagnosis not present

## 2021-12-28 DIAGNOSIS — E039 Hypothyroidism, unspecified: Secondary | ICD-10-CM | POA: Diagnosis not present

## 2021-12-28 DIAGNOSIS — I129 Hypertensive chronic kidney disease with stage 1 through stage 4 chronic kidney disease, or unspecified chronic kidney disease: Secondary | ICD-10-CM | POA: Diagnosis not present

## 2021-12-28 DIAGNOSIS — E782 Mixed hyperlipidemia: Secondary | ICD-10-CM | POA: Diagnosis not present

## 2021-12-28 DIAGNOSIS — Z0000 Encounter for general adult medical examination without abnormal findings: Secondary | ICD-10-CM | POA: Diagnosis not present

## 2021-12-29 DIAGNOSIS — N1832 Chronic kidney disease, stage 3b: Secondary | ICD-10-CM | POA: Diagnosis not present

## 2021-12-29 DIAGNOSIS — N2581 Secondary hyperparathyroidism of renal origin: Secondary | ICD-10-CM | POA: Diagnosis not present

## 2021-12-29 DIAGNOSIS — I129 Hypertensive chronic kidney disease with stage 1 through stage 4 chronic kidney disease, or unspecified chronic kidney disease: Secondary | ICD-10-CM | POA: Diagnosis not present

## 2021-12-29 DIAGNOSIS — D631 Anemia in chronic kidney disease: Secondary | ICD-10-CM | POA: Diagnosis not present

## 2022-01-01 ENCOUNTER — Other Ambulatory Visit: Payer: Self-pay | Admitting: Student

## 2022-01-01 DIAGNOSIS — E1122 Type 2 diabetes mellitus with diabetic chronic kidney disease: Secondary | ICD-10-CM | POA: Diagnosis not present

## 2022-01-01 DIAGNOSIS — N183 Chronic kidney disease, stage 3 unspecified: Secondary | ICD-10-CM | POA: Diagnosis not present

## 2022-01-02 ENCOUNTER — Other Ambulatory Visit: Payer: Self-pay | Admitting: Student

## 2022-01-02 LAB — BASIC METABOLIC PANEL
BUN/Creatinine Ratio: 12 (ref 10–24)
BUN: 27 mg/dL (ref 8–27)
CO2: 24 mmol/L (ref 20–29)
Calcium: 9.8 mg/dL (ref 8.6–10.2)
Chloride: 102 mmol/L (ref 96–106)
Creatinine, Ser: 2.23 mg/dL — ABNORMAL HIGH (ref 0.76–1.27)
Glucose: 111 mg/dL — ABNORMAL HIGH (ref 70–99)
Potassium: 5 mmol/L (ref 3.5–5.2)
Sodium: 140 mmol/L (ref 134–144)
eGFR: 30 mL/min/{1.73_m2} — ABNORMAL LOW (ref >59)

## 2022-01-04 DIAGNOSIS — J432 Centrilobular emphysema: Secondary | ICD-10-CM | POA: Diagnosis not present

## 2022-01-04 DIAGNOSIS — D751 Secondary polycythemia: Secondary | ICD-10-CM | POA: Diagnosis not present

## 2022-01-04 DIAGNOSIS — E782 Mixed hyperlipidemia: Secondary | ICD-10-CM | POA: Diagnosis not present

## 2022-01-04 DIAGNOSIS — N1832 Chronic kidney disease, stage 3b: Secondary | ICD-10-CM | POA: Diagnosis not present

## 2022-01-04 DIAGNOSIS — I700 Atherosclerosis of aorta: Secondary | ICD-10-CM | POA: Diagnosis not present

## 2022-01-04 DIAGNOSIS — I129 Hypertensive chronic kidney disease with stage 1 through stage 4 chronic kidney disease, or unspecified chronic kidney disease: Secondary | ICD-10-CM | POA: Diagnosis not present

## 2022-01-04 DIAGNOSIS — I5042 Chronic combined systolic (congestive) and diastolic (congestive) heart failure: Secondary | ICD-10-CM | POA: Diagnosis not present

## 2022-01-04 DIAGNOSIS — I25118 Atherosclerotic heart disease of native coronary artery with other forms of angina pectoris: Secondary | ICD-10-CM | POA: Diagnosis not present

## 2022-01-04 DIAGNOSIS — D692 Other nonthrombocytopenic purpura: Secondary | ICD-10-CM | POA: Diagnosis not present

## 2022-01-04 DIAGNOSIS — I130 Hypertensive heart and chronic kidney disease with heart failure and stage 1 through stage 4 chronic kidney disease, or unspecified chronic kidney disease: Secondary | ICD-10-CM | POA: Diagnosis not present

## 2022-01-04 DIAGNOSIS — E1121 Type 2 diabetes mellitus with diabetic nephropathy: Secondary | ICD-10-CM | POA: Diagnosis not present

## 2022-01-04 DIAGNOSIS — Z0000 Encounter for general adult medical examination without abnormal findings: Secondary | ICD-10-CM | POA: Diagnosis not present

## 2022-01-05 ENCOUNTER — Other Ambulatory Visit: Payer: Self-pay | Admitting: Student

## 2022-01-05 ENCOUNTER — Telehealth: Payer: Self-pay | Admitting: Hematology and Oncology

## 2022-01-05 NOTE — Telephone Encounter (Signed)
Scheduled appt per 2/21 referral. Pt is aware of appt date and time. Pt is aware to arrive 15 mins prior to appt time and to bring and updated insurance card. Pt is aware of appt location.   ?

## 2022-01-05 NOTE — Progress Notes (Signed)
Called and spoke with patient regarding his lab results. Patient will hold Wilder Glade, ( he stated that you wanted him to replace [place] with a fluid pill), please advise, and his next appointment with Nephrologist is 02/05/22.

## 2022-01-07 NOTE — Progress Notes (Signed)
She should have stopped valsartan. He should not been taking valsartna or farxiga presently. Please call patient and do compelte med rec and let me know when that is done.

## 2022-01-07 NOTE — Progress Notes (Signed)
[facility] NOTE  Patient Care Team: Merrilee Seashore, MD as PCP - General (Internal Medicine) Adrian Prows, MD as Consulting Physician (Cardiology)  CHIEF COMPLAINTS/PURPOSE OF CONSULTATION:  Newly diagnosed polycythemia  HISTORY OF PRESENTING ILLNESS:  Jesus Young 76 y.o. male is here because of recent diagnosis of polycythemia. He presents to the clinic today for initial evaluation and discussion of treatment options.  He had acute coronary syndrome and underwent stent placement and since then he has felt much better.  However he continues to have mild to moderate fatigue issues.  Denies any headaches or lightheadedness.  He does have blurred vision related to eye issues.  I reviewed her records extensively and collaborated the history with the patient.  MEDICAL HISTORY:  Past Medical History:  Diagnosis Date   Anxiety    Arthritis    back and knees   Bronchitis    hx of 2015   Cancer ([place])    prostate   Cataract    immature on right   Chronic back pain    CKD (chronic kidney disease)    Constipation    takes Colace at bedtime   Coronary artery disease    takes Plavix daily but is on hold for surgery   Depression    Diabetes mellitus without complication (HCC)    takes Levemir and Humalog daily   Dizziness    Fall    hx of with injury to left shoulder    GERD (gastroesophageal reflux disease)    takes Protonix daily and Zantac   H/O urinary frequency    Hemorrhoids    History of bladder infections    History of kidney stones    Hyperlipidemia    takes Zocor every evening   Hypertension    takes Metoprolol daily   Hypothyroidism    takes Synthroid daily   Left bundle branch block    Myocardial infarction ([place])    many yrs before 2002   Night muscle spasms    takes Robaxin 4 x day    Nocturia     SURGICAL HISTORY: Past Surgical History:  Procedure Laterality Date   CARDIAC CATHETERIZATION  05/17/02   COLONOSCOPY      CORONARY ANGIOPLASTY     x 1    CORONARY ARTERY BYPASS GRAFT  2002   x 5   eyelid surgery      INGUINAL HERNIA REPAIR Left 05/06/2020   Procedure: LEFT INGUINAL HERNIA REPAIR;  Surgeon: Donnie Mesa, MD;  Location: [facility];  Service: General;  Laterality: Left;   INSERTION OF MESH Left 05/06/2020   Procedure: INSERTION OF MESH;  Surgeon: Donnie Mesa, MD;  Location: [facility];  Service: General;  Laterality: Left;   LEFT HEART CATH AND CORS/GRAFTS ANGIOGRAPHY N/A 12/05/2018   Procedure: LEFT HEART CATH AND CORS/GRAFTS ANGIOGRAPHY;  Surgeon: Adrian Prows, MD;  Location: [facility];  Service: Cardiovascular;  Laterality: N/A;   LEFT HEART CATH AND CORS/GRAFTS ANGIOGRAPHY N/A 08/21/2021   Procedure: LEFT HEART CATH AND CORS/GRAFTS ANGIOGRAPHY;  Surgeon: Adrian Prows, MD;  Location: [facility];  Service: Cardiovascular;  Laterality: N/A;   LUMBAR LAMINECTOMY/DECOMPRESSION MICRODISCECTOMY Bilateral 07/18/2013   Procedure: Bilateral Lumbar four-five Lumbar Laminotomy/Microdiskectomy;  Surgeon: Hosie Spangle, MD;  Location: [facility];  Service: Neurosurgery;  Laterality: Bilateral;  Bilateral Lumbar four-five Lumbar Laminotomy/Microdiskectomy   NEPHROLITHOTOMY Left 06/23/2015   Procedure: NEPHROLITHOTOMY PERCUTANEOUS;  Surgeon: [name], MD;  Location: [facility];  Service: Urology;  Laterality: Left;   PROSTATECTOMY  10/2009   skin spots removed from back -precancerous     TONSILLECTOMY      SOCIAL HISTORY: Social History   Socioeconomic History   Marital status: Married    Spouse name: Not on file   Number of children: 0   Years of education: Not on file   Highest education level: Not on file  Occupational History   Not on file  Tobacco Use   Smoking status: Never   Smokeless tobacco: Never  Vaping Use   Vaping Use: Never used  Substance and Sexual Activity   Alcohol use: No   Drug use: No   Sexual activity: Not Currently  Other Topics Concern   Not on file  Social  History Narrative   Not on file   Social Determinants of Health   Financial Resource Strain: Not on file  Food Insecurity: Not on file  Transportation Needs: Not on file  Physical Activity: Not on file  Stress: Not on file  Social Connections: Not on file  Intimate Partner Violence: Not on file    FAMILY HISTORY: Family History  Problem Relation Age of Onset   Heart disease Father    Heart attack Father    Thyroid disease Sister     ALLERGIES:  is allergic to tetanus antitoxin, clonazepam, and lisinopril.  MEDICATIONS:  Current Outpatient Medications  Medication Sig Dispense Refill   acetaminophen (TYLENOL) 650 MG CR tablet Take 1,300 mg by mouth in the morning and at bedtime.     amLODipine (NORVASC) 5 MG tablet TAKE 2 TABLETS(10 MG) BY MOUTH DAILY 180 tablet 0   aspirin EC 81 MG tablet Take 81 mg by mouth at bedtime.      B-D ULTRAFINE III SHORT PEN 31G X 8 MM MISC USE UTD  0   BRILINTA 90 MG TABS tablet TAKE 1 TABLET(90 MG) BY MOUTH TWICE DAILY 60 tablet 0   busPIRone (BUSPAR) 15 MG tablet Take 30 mg by mouth 2 (two) times daily.     clopidogrel (PLAVIX) 75 MG tablet Take 75 mg by mouth daily.     docusate sodium (COLACE) 100 MG capsule Take 400 mg by mouth at bedtime.     famotidine (PEPCID) 40 MG tablet Take 40 mg by mouth daily.     FARXIGA 10 MG TABS tablet TAKE 1 TABLET BY MOUTH BEFORE BREAKFAST. 30 tablet 3   furosemide (LASIX) 40 MG tablet Take 1 tablet (40 mg total) by mouth daily as needed for fluid or edema. 30 tablet 3   insulin detemir (LEVEMIR) 100 UNIT/ML injection Inject 0.16 mLs (16 Units total) into the skin 2 (two) times daily. (Patient taking differently: Inject 16-18 Units into the skin 2 (two) times daily.) 10 mL 1   insulin lispro (HUMALOG) 100 UNIT/ML injection Inject 0.05-0.09 mLs (5-9 Units total) into the skin 2 (two) times daily. 5 units at lunch, and 9 units after dinner. (Patient taking differently: Inject 5-15 Units into the skin 2 (two) times  daily. 5-15 units as needed  (sliding scale insulin)) 10 mL 1   isosorbide mononitrate (IMDUR) 120 MG 24 hr tablet TAKE 1 TABLET(120 MG) BY MOUTH DAILY (Patient taking differently: Take 120 mg by mouth daily.) 90 tablet 3   levothyroxine (SYNTHROID, LEVOTHROID) 100 MCG tablet Take 100 mcg by mouth See admin instructions. Take 1 tablet (100 mcg) by mouth daily in the morning, except on Sundays     metoprolol (LOPRESSOR) 50 MG tablet Take  50 mg by mouth 2 (two) times daily.      montelukast (SINGULAIR) 10 MG tablet Take 1 tablet (10 mg total) by mouth at bedtime. 30 tablet 11   nitroGLYCERIN (NITROSTAT) 0.4 MG SL tablet Place 1 tablet (0.4 mg total) under the tongue every 5 (five) minutes as needed for chest pain. 15 tablet 3   ONE TOUCH ULTRA TEST test strip      pantoprazole (PROTONIX) 40 MG tablet Take 40 mg by mouth at bedtime.      ranolazine (RANEXA) 500 MG 12 hr tablet Take 2 tablets by mouth twice daily 180 tablet 1   rosuvastatin (CRESTOR) 20 MG tablet TAKE 1 TABLET BY MOUTH EVERY DAY. DISCONTINUE SIMVASTATIN 90 tablet 3   No current facility-administered medications for this visit.    REVIEW OF SYSTEMS:   Constitutional: Denies fevers, chills or abnormal night sweats   All other systems were reviewed with the patient and are negative.  PHYSICAL EXAMINATION: ECOG PERFORMANCE STATUS: 1 - Symptomatic but completely ambulatory  Vitals:   01/08/22 1544  BP: (!) 154/81  Pulse: 75  Resp: 18  Temp: (!) 97.3 F (36.3 C)  SpO2: 99%   Filed Weights   01/08/22 1544  Weight: 187 lb 1.6 oz (84.9 kg)       LABORATORY DATA:  I have reviewed the data as listed Lab Results  Component Value Date   WBC 7.8 08/22/2021   HGB 15.9 08/22/2021   HCT 48.8 08/22/2021   MCV 96.4 08/22/2021   PLT 214 08/22/2021   Lab Results  Component Value Date   NA 140 01/01/2022   K 5.0 01/01/2022   CL 102 01/01/2022   CO2 24 01/01/2022    RADIOGRAPHIC STUDIES: I have personally reviewed the  radiological reports and agreed with the findings in the report.  ASSESSMENT AND PLAN:  Polycythemia, secondary Lab review: 08/22/2021: WBC 7.8, hemoglobin 15.9, platelets 214 12/28/2021: WBC 6.2, hemoglobin 18.4, hematocrit 55.8, platelets 219  I discussed with the patient extensively the differential diagnosis of polycythemia 1. Primary polycythemia due to clonal stem cell abnormality 2. Secondary polycythemia due to cause that include hypoxia, heart or lung problems, altitude, athletics, erythropoietin producing lesion/tumors etc  Recommendation: 1. JAK-2 mutation testing to evaluate polycythemia vera 2. Erythropoietin level   We discussed the importance of hydration.  Because he has kidney issues he was recommended not to consume too much liquids.  However I felt that at least 60 ounces of water per day would be beneficial.  Indications for phlebotomy 1. Primary polycythemia with hematocrit over 55 2. Secondary polycythemia with severe symptoms which include strokelike symptoms, severe recurrent headaches, severe fatigue.  (Patient has fatigue related to his cardiac issues)  Patient does not have any clear-cut symptoms that would require phlebotomy at this time. Ultrasound of the kidney 2022: Benign  I will call him next week to discuss the results of the blood work and come up with a treatment plan  All questions were answered. The patient knows to call the clinic with any problems, questions or concerns.   Rulon Eisenmenger, MD, MPH 01/08/2022    I, Thana Ates, am acting as scribe for Nicholas Lose, MD.  I have reviewed the above documentation for accuracy and completeness, and I agree with the above.

## 2022-01-08 ENCOUNTER — Inpatient Hospital Stay: Payer: Medicare Other

## 2022-01-08 ENCOUNTER — Other Ambulatory Visit: Payer: Self-pay

## 2022-01-08 ENCOUNTER — Inpatient Hospital Stay: Payer: Medicare Other | Attending: Hematology and Oncology | Admitting: Hematology and Oncology

## 2022-01-08 DIAGNOSIS — K219 Gastro-esophageal reflux disease without esophagitis: Secondary | ICD-10-CM | POA: Insufficient documentation

## 2022-01-08 DIAGNOSIS — E039 Hypothyroidism, unspecified: Secondary | ICD-10-CM | POA: Diagnosis not present

## 2022-01-08 DIAGNOSIS — D45 Polycythemia vera: Secondary | ICD-10-CM | POA: Insufficient documentation

## 2022-01-08 DIAGNOSIS — Z7984 Long term (current) use of oral hypoglycemic drugs: Secondary | ICD-10-CM | POA: Diagnosis not present

## 2022-01-08 DIAGNOSIS — C61 Malignant neoplasm of prostate: Secondary | ICD-10-CM | POA: Diagnosis not present

## 2022-01-08 DIAGNOSIS — E785 Hyperlipidemia, unspecified: Secondary | ICD-10-CM | POA: Insufficient documentation

## 2022-01-08 DIAGNOSIS — Z794 Long term (current) use of insulin: Secondary | ICD-10-CM | POA: Diagnosis not present

## 2022-01-08 DIAGNOSIS — N189 Chronic kidney disease, unspecified: Secondary | ICD-10-CM | POA: Diagnosis not present

## 2022-01-08 DIAGNOSIS — H538 Other visual disturbances: Secondary | ICD-10-CM | POA: Insufficient documentation

## 2022-01-08 DIAGNOSIS — Z79899 Other long term (current) drug therapy: Secondary | ICD-10-CM | POA: Insufficient documentation

## 2022-01-08 DIAGNOSIS — Z7982 Long term (current) use of aspirin: Secondary | ICD-10-CM | POA: Diagnosis not present

## 2022-01-08 DIAGNOSIS — I129 Hypertensive chronic kidney disease with stage 1 through stage 4 chronic kidney disease, or unspecified chronic kidney disease: Secondary | ICD-10-CM | POA: Diagnosis not present

## 2022-01-08 DIAGNOSIS — E119 Type 2 diabetes mellitus without complications: Secondary | ICD-10-CM | POA: Diagnosis not present

## 2022-01-08 DIAGNOSIS — I2510 Atherosclerotic heart disease of native coronary artery without angina pectoris: Secondary | ICD-10-CM | POA: Insufficient documentation

## 2022-01-08 DIAGNOSIS — D751 Secondary polycythemia: Secondary | ICD-10-CM

## 2022-01-08 DIAGNOSIS — I252 Old myocardial infarction: Secondary | ICD-10-CM | POA: Diagnosis not present

## 2022-01-08 NOTE — Assessment & Plan Note (Signed)
Lab review: 08/22/2021: WBC 7.8, hemoglobin 15.9, platelets 214 12/28/2021: WBC 6.2, hemoglobin 18.4, hematocrit 55.8, platelets 219  I discussed with the patient extensively the differential diagnosis of polycythemia 1. Primary polycythemia due to clonal stem cell abnormality 2. Secondary polycythemia due to cause that include hypoxia, heart or lung problems, altitude, athletics, erythropoietin producing lesion/tumors etc  Recommendation: 1. JAK-2 mutation testing to evaluate polycythemia vera 2. Erythropoietin level 3. Drink 8 glasses of water per day  Indications for phlebotomy 1. Primary polycythemia with hematocrit over 55 2. Secondary polycythemia with severe symptoms which include strokelike symptoms, severe recurrent headaches, severe fatigue.  Patient does not have any clear-cut symptoms that would require phlebotomy at this time. If the JAK-2 mutation is normal and erythropoietin level is normal, a bone marrow biopsy may be considered. If the erythropoietin is elevated, he will need ultrasound of the liver and kidney for further evaluation.   I provided patient written information on polycythemia.

## 2022-01-09 LAB — ERYTHROPOIETIN: Erythropoietin: 16.9 m[IU]/mL (ref 2.6–18.5)

## 2022-01-14 DIAGNOSIS — E119 Type 2 diabetes mellitus without complications: Secondary | ICD-10-CM | POA: Diagnosis not present

## 2022-01-14 DIAGNOSIS — I10 Essential (primary) hypertension: Secondary | ICD-10-CM | POA: Diagnosis not present

## 2022-01-14 DIAGNOSIS — D751 Secondary polycythemia: Secondary | ICD-10-CM | POA: Diagnosis not present

## 2022-01-14 LAB — JAK 2 EXON 12 (GENPATH)

## 2022-01-15 NOTE — Progress Notes (Signed)
Called patient, Jesus Young, Jesus Young

## 2022-01-15 NOTE — Progress Notes (Signed)
?  HEMATOLOGY-ONCOLOGY TELEPHONE VISIT PROGRESS NOTE ? ?I connected with Hebert Soho on 01/18/2022 at 11:15 AM EST by telephone and verified that I am speaking with the correct person using two identifiers.  ?I discussed the limitations, risks, security and privacy concerns of performing an evaluation and management service by telephone and the availability of in person appointments.  ?I also discussed with the patient that there may be a patient responsible charge related to this service. The patient expressed understanding and agreed to proceed.  ? ?History of Present Illness: Jesus Young is a 76 y.o. male with above-mentioned history of polycythemia. He presents via telephone today for follow-up. He had labs for polycythemia and is connecting with me by phone to discuss results ? ?Observations/Objective:  ? ?  ?Assessment Plan:  ?Polycythemia vera ([place]) ?Lab review: ?08/22/2021: WBC 7.8, hemoglobin 15.9, platelets 214 ?12/28/2021: WBC 6.2, hemoglobin 18.4, hematocrit 55.8, platelets 219  ?Erythropoietin levels: 16.9: Normal ? 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 ? ?I discussed the assessment and treatment plan with the patient. The patient was provided an opportunity to ask questions and all were answered. The patient agreed with the plan and demonstrated an understanding of the instructions. The patient was advised to call back or seek an in-person evaluation if the symptoms worsen or if the condition fails to improve as anticipated.  ? ?Total time spent: 15 mins including non-face to face time and time spent for planning, charting and coordination of care ? ?Rulon Eisenmenger, MD ?01/18/2022  ? ? I, Thana Ates, am acting as scribe for Nicholas Lose, MD. ? ?I have reviewed the above documentation for accuracy and  completeness, and I agree with the above. ?  ? ?

## 2022-01-18 ENCOUNTER — Inpatient Hospital Stay: Payer: Medicare Other | Attending: Hematology and Oncology | Admitting: Hematology and Oncology

## 2022-01-18 DIAGNOSIS — D45 Polycythemia vera: Secondary | ICD-10-CM

## 2022-01-18 NOTE — Assessment & Plan Note (Addendum)
Lab review: ?08/22/2021: WBC 7.8, hemoglobin 15.9, platelets 214 ?12/28/2021: WBC 6.2, hemoglobin 18.4, hematocrit 55.8, platelets 219  ?Erythropoietin levels: 16.9: Normal ? 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

## 2022-01-19 ENCOUNTER — Encounter: Payer: Self-pay | Admitting: Podiatry

## 2022-01-19 ENCOUNTER — Telehealth: Payer: Self-pay | Admitting: Hematology and Oncology

## 2022-01-19 ENCOUNTER — Other Ambulatory Visit: Payer: Self-pay

## 2022-01-19 ENCOUNTER — Ambulatory Visit: Payer: Medicare Other | Admitting: Podiatry

## 2022-01-19 DIAGNOSIS — D2372 Other benign neoplasm of skin of left lower limb, including hip: Secondary | ICD-10-CM | POA: Diagnosis not present

## 2022-01-19 DIAGNOSIS — D2371 Other benign neoplasm of skin of right lower limb, including hip: Secondary | ICD-10-CM | POA: Diagnosis not present

## 2022-01-19 DIAGNOSIS — E119 Type 2 diabetes mellitus without complications: Secondary | ICD-10-CM | POA: Diagnosis not present

## 2022-01-19 DIAGNOSIS — B351 Tinea unguium: Secondary | ICD-10-CM | POA: Diagnosis not present

## 2022-01-19 DIAGNOSIS — M79676 Pain in unspecified toe(s): Secondary | ICD-10-CM | POA: Diagnosis not present

## 2022-01-19 DIAGNOSIS — L84 Corns and callosities: Secondary | ICD-10-CM | POA: Diagnosis not present

## 2022-01-19 DIAGNOSIS — D689 Coagulation defect, unspecified: Secondary | ICD-10-CM | POA: Diagnosis not present

## 2022-01-19 DIAGNOSIS — D751 Secondary polycythemia: Secondary | ICD-10-CM | POA: Insufficient documentation

## 2022-01-19 DIAGNOSIS — D692 Other nonthrombocytopenic purpura: Secondary | ICD-10-CM | POA: Insufficient documentation

## 2022-01-19 NOTE — Telephone Encounter (Signed)
Scheduled appointment per 3/6 los. Talked with patient's wife Vaughan Basta) about the patient's upcoming appointment. Wife is aware. ?

## 2022-01-19 NOTE — Progress Notes (Signed)
Finally spoke with patient today, and NO he has not been taking Valsartan nor the [place]. He did have another question though. Patient stopped taking the Plavix when he started the [place] originally. Now, he is asking should he go back on the Plavix. Please advise.

## 2022-01-19 NOTE — Progress Notes (Signed)
He presents today for follow-up of his nails and calluses. ? ?Objective: Vital signs are stable he is alert oriented x3 pulses remain palpable.  He has some edema about his legs.  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 ? 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 ? ?Plan: Debridement of nails bilateral. ?

## 2022-01-21 ENCOUNTER — Other Ambulatory Visit: Payer: Self-pay | Admitting: Cardiology

## 2022-01-21 NOTE — Progress Notes (Signed)
Patient should be taking aspirin and Brilinta, not Plavix.  Please do complete medication reconciliation as his med list appears to be wrong.  Also please advise patient to make appointment with nephrology as his creatinine has remained elevated compared to 2 months ago.   Please send recent BMP to nephrologist

## 2022-01-22 ENCOUNTER — Telehealth: Payer: Self-pay | Admitting: Student

## 2022-01-22 NOTE — Progress Notes (Signed)
Spoke with patient wife, they will wait for our call on Monday, in regards to Nephrologist.

## 2022-01-22 NOTE — Progress Notes (Signed)
Patient is aware he can take Furosemide as needed.  Patient's Nephrologist if Dr. Elmarie Shiley. He also stated that he called to check after we spoke, and he did not have anything scheduled, but was told he would get a callback to schedule an appointment for sometime in June. He is concerned that June may be to far out and would like your opinion. Please advise.

## 2022-01-22 NOTE — Telephone Encounter (Signed)
Called patient, he wanted to add Vitamin D3 29mg QD.

## 2022-01-22 NOTE — Telephone Encounter (Signed)
Pt called in and stated that he was speaking with someone regarding his medications. He said that he forgot one and he needs a call back so he can discuss it with a MA ?

## 2022-01-22 NOTE — Progress Notes (Signed)
See last message

## 2022-01-22 NOTE — Progress Notes (Signed)
Medication reconciliation has been done, I placed list on your desk and he said he knows for sure he has an appointment, but can not find the date card right now, but will have something for you by the time he comes in to his next appointment with you on 02/01/2022. Patient said he has not been taking Furosemide and asked if he can take it if he gets short of breath? Please advise.

## 2022-01-22 NOTE — Progress Notes (Signed)
Present medications look good, he may continue the same.  Patient can take furosemide as needed, but if he notices he is needing it more than 3 days in a row please have him call our office so that we can order follow-up labs.  Can you please get and send me contact information for his nephrologist office.  I would like to call them and touch base

## 2022-01-23 ENCOUNTER — Other Ambulatory Visit: Payer: Self-pay | Admitting: Pulmonary Disease

## 2022-01-26 ENCOUNTER — Other Ambulatory Visit: Payer: Self-pay | Admitting: Student

## 2022-01-26 DIAGNOSIS — I5022 Chronic systolic (congestive) heart failure: Secondary | ICD-10-CM

## 2022-01-26 DIAGNOSIS — E1122 Type 2 diabetes mellitus with diabetic chronic kidney disease: Secondary | ICD-10-CM

## 2022-01-26 NOTE — Progress Notes (Signed)
Spoke with Dr. Elmarie Shiley who requests that labs be sent to him later this week and will determine further recommendations from there. Agrees with holding Farxiga and valsartan.  ?

## 2022-01-28 ENCOUNTER — Other Ambulatory Visit: Payer: Self-pay | Admitting: Student

## 2022-01-28 DIAGNOSIS — I5022 Chronic systolic (congestive) heart failure: Secondary | ICD-10-CM | POA: Diagnosis not present

## 2022-01-28 DIAGNOSIS — E1122 Type 2 diabetes mellitus with diabetic chronic kidney disease: Secondary | ICD-10-CM | POA: Diagnosis not present

## 2022-01-28 DIAGNOSIS — N183 Chronic kidney disease, stage 3 unspecified: Secondary | ICD-10-CM | POA: Diagnosis not present

## 2022-01-29 LAB — BASIC METABOLIC PANEL
BUN/Creatinine Ratio: 14 (ref 10–24)
BUN: 26 mg/dL (ref 8–27)
CO2: 23 mmol/L (ref 20–29)
Calcium: 8.9 mg/dL (ref 8.6–10.2)
Chloride: 105 mmol/L (ref 96–106)
Creatinine, Ser: 1.84 mg/dL — ABNORMAL HIGH (ref 0.76–1.27)
Glucose: 77 mg/dL (ref 70–99)
Potassium: 4.4 mmol/L (ref 3.5–5.2)
Sodium: 141 mmol/L (ref 134–144)
eGFR: 38 mL/min/{1.73_m2} — ABNORMAL LOW (ref >59)

## 2022-01-29 LAB — BRAIN NATRIURETIC PEPTIDE: BNP: 611.6 pg/mL — ABNORMAL HIGH (ref 0.0–100.0)

## 2022-02-01 ENCOUNTER — Encounter: Payer: Self-pay | Admitting: Student

## 2022-02-01 ENCOUNTER — Ambulatory Visit: Payer: Medicare Other | Admitting: Student

## 2022-02-01 ENCOUNTER — Other Ambulatory Visit: Payer: Self-pay

## 2022-02-01 VITALS — BP 113/57 | HR 71 | Temp 98.0°F | Resp 17 | Ht 74.0 in | Wt 189.6 lb

## 2022-02-01 DIAGNOSIS — I5022 Chronic systolic (congestive) heart failure: Secondary | ICD-10-CM

## 2022-02-01 DIAGNOSIS — I25708 Atherosclerosis of coronary artery bypass graft(s), unspecified, with other forms of angina pectoris: Secondary | ICD-10-CM | POA: Diagnosis not present

## 2022-02-01 DIAGNOSIS — N183 Chronic kidney disease, stage 3 unspecified: Secondary | ICD-10-CM | POA: Diagnosis not present

## 2022-02-01 DIAGNOSIS — E1122 Type 2 diabetes mellitus with diabetic chronic kidney disease: Secondary | ICD-10-CM | POA: Diagnosis not present

## 2022-02-01 MED ORDER — NITROGLYCERIN 0.4 MG SL SUBL
0.4000 mg | SUBLINGUAL_TABLET | SUBLINGUAL | 3 refills | Status: DC | PRN
Start: 2022-02-01 — End: 2022-09-13

## 2022-02-01 NOTE — Progress Notes (Signed)
Mutual patient, lab results.

## 2022-02-01 NOTE — Progress Notes (Signed)
? ?Primary Physician:  Merrilee Seashore, MD ? ? ?[name], male    DOB: [birth year], 76 y.o.   MRN: [id number] ? ?Subjective:  ? ? ?Chief Complaint  ?Patient presents with  ? hf  ?  3 month  ? Coronary Artery Disease  ? ? ?HPI: Jesus Young  is a 76 y.o. male  with known coronary artery disease s/p CABG in 2012,  left bundle branch block that was noted  2014, hypertension, diabetes, hyperlipidemia and chronic kidney disease. History of Covid 19 infection during March 2022.  ? ?Due to complaints of left arm pain with exertion, underwent lexiscan nuclear stress test on 08/07/2018 considered high risk study due to LVEF; however, on echocardiogram had normal LVEF.  No significant changes were noted compared to the 2014, but in view of continued symptoms despite medical management, he underwent coronary angiogram on 12/05/2018 and patient was noted to have small vessel and microvascular disease. Patient again presented with exertional symptoms, therefore underwent repeat echocardiogram and nuclear stress test 11/2020.  Stress test was high risk given low stress LVEF, however this was reviewed with Dr. Einar Gip who felt it was likely an error related to known left bundle branch block as echocardiogram at that time revealed LVEF 45-50%.  Due to worsening shortness of breath he underwent repeat echocardiogram 06/2021 which revealed LVEF had reduced further to 30-35%. ? ?Patient then presented to [facility] emergency department with unstable angina 08/21/2021.  Patient subsequently underwent urgent left heart catheterization which revealed disease in SVG to RCA graft, underwent successful PCI and discharged home on aspirin and Brilinta.  Patient presented for follow-up after admission and valsartan was rechallenged, however patient's renal function deteriorated, valsartan was stopped, and creatinine remained elevated.  Wilder Glade was subsequently discontinued given worsening renal function.  Repeat BMP  following discontinuation of valsartan and Farxiga showed improvement of creatinine.  Patient is followed by nephrology who has been working closely with our cardiology team to manage patient, which is greatly appreciated. ? ?Patient presents today for follow-up.  He has developed intermittent dyspnea on exertion as well as leg swelling over the last few weeks.  Denies chest pain, orthopnea. ? ?Past Medical History:  ?Diagnosis Date  ? Anxiety   ? Arthritis   ? back and knees  ? Bronchitis   ? hx of 2015  ? Cancer [facility])   ? prostate  ? Cataract   ? immature on right  ? Chronic back pain   ? CKD (chronic kidney disease)   ? Constipation   ? takes Colace at bedtime  ? Coronary artery disease   ? takes Plavix daily but is on hold for surgery  ? Depression   ? Diabetes mellitus without complication ([facility])   ? takes Levemir and Humalog daily  ? Dizziness   ? Fall   ? hx of with injury to left shoulder   ? GERD (gastroesophageal reflux disease)   ? takes Protonix daily and Zantac  ? H/O urinary frequency   ? Hemorrhoids   ? History of bladder infections   ? History of kidney stones   ? Hyperlipidemia   ? takes Zocor every evening  ? Hypertension   ? takes Metoprolol daily  ? Hypothyroidism   ? takes Synthroid daily  ? Left bundle branch block   ? Myocardial infarction [facility])   ? 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 Night muscle spasms   ? takes Robaxin 4 x day   ? Nocturia   ? ?  Past Surgical History:  ?Procedure Laterality Date  ? CARDIAC CATHETERIZATION  05/17/02  ? COLONOSCOPY    ? CORONARY ANGIOPLASTY    ? x 1   ? CORONARY ARTERY BYPASS GRAFT  2002  ? x 5  ? eyelid surgery     ? INGUINAL HERNIA REPAIR Left 05/06/2020  ? Procedure: LEFT INGUINAL HERNIA REPAIR;  Surgeon: Donnie Mesa, MD;  Location: [place];  Service: General;  Laterality: Left;  ? INSERTION OF MESH Left 05/06/2020  ? Procedure: INSERTION OF MESH;  Surgeon: Donnie Mesa, MD;  Location: [place];  Service: General;  Laterality: Left;  ? LEFT HEART CATH AND CORS/GRAFTS  ANGIOGRAPHY N/A 12/05/2018  ? Procedure: LEFT HEART CATH AND CORS/GRAFTS ANGIOGRAPHY;  Surgeon: Adrian Prows, MD;  Location: [place] [facility];  Service: Cardiovascular;  Laterality: N/A;  ? LEFT HEART CATH AND CORS/GRAFTS ANGIOGRAPHY N/A 08/21/2021  ? Procedure: LEFT HEART CATH AND CORS/GRAFTS ANGIOGRAPHY;  Surgeon: Adrian Prows, MD;  Location: [place] [facility];  Service: Cardiovascular;  Laterality: N/A;  ? LUMBAR LAMINECTOMY/DECOMPRESSION MICRODISCECTOMY Bilateral 07/18/2013  ? Procedure: Bilateral Lumbar four-five Lumbar Laminotomy/Microdiskectomy;  Surgeon: Hosie Spangle, MD;  Location: [place] [facility];  Service: Neurosurgery;  Laterality: Bilateral;  Bilateral Lumbar four-five Lumbar Laminotomy/Microdiskectomy  ? NEPHROLITHOTOMY Left 06/23/2015  ? Procedure: NEPHROLITHOTOMY PERCUTANEOUS;  Surgeon: [name], MD;  Location: [facility];  Service: Urology;  Laterality: Left;  ? PROSTATECTOMY  10/2009  ? skin spots removed from back -precancerous    ? TONSILLECTOMY    ? ?Family History  ?Problem Relation Age of Onset  ? Heart disease Father   ? Heart attack Father   ? Thyroid disease Sister   ? ?Social History  ? ?Tobacco Use  ? Smoking status: Never  ? Smokeless tobacco: Never  ?Substance Use Topics  ? Alcohol use: No  ?Martial status: Married ? ?ROS  ? ?Review of Systems  ?Constitutional: Negative for malaise/fatigue and weight gain.  ?Cardiovascular:  Positive for chest pain (single epsiode). Negative for claudication, dyspnea on exertion, leg swelling, near-syncope, orthopnea, palpitations, paroxysmal nocturnal dyspnea and syncope.  ?Respiratory:  Positive for shortness of breath (occasional, non-exertional).   ?Musculoskeletal:   ?     Occasional mild left arm discomfort, improved following PCI  ?   ?Objective:  ?Blood pressure (!) 113/57, pulse 71, temperature 98 ?F (36.7 ?C), temperature source Temporal, resp. rate 17, height '6\' 2"'$  (1.88 m), weight 189 lb 9.6 oz (86 kg), SpO2 98 %. Body mass index is 24.34  kg/m?. ?  ?Physical Exam ?Vitals reviewed.  ?Constitutional:   ?   General: He is not in acute distress. ?   Appearance: He is well-developed.  ?Cardiovascular:  ?   Rate and Rhythm: Normal rate and regular rhythm.  ?   Pulses: Intact distal pulses.     ?     Carotid pulses are 2+ on the right side and 2+ on the left side. ?     Radial pulses are 2+ on the right side and 2+ on the left side.  ?     Femoral pulses are 2+ on the right side and 2+ on the left side. ?     Popliteal pulses are 2+ on the right side and 2+ on the left side.  ?     Dorsalis pedis pulses are 2+ on the right side and 2+ on the left side.  ?     Posterior tibial pulses are 2+ on the right side and  2+ on the left side.  ?   Heart sounds: Normal heart sounds, S1 normal and S2 normal. No murmur heard. ?  No gallop.  ?Pulmonary:  ?   Effort: Pulmonary effort is normal. No accessory muscle usage or respiratory distress.  ?   Breath sounds: Normal breath sounds. No wheezing, rhonchi or rales.  ?Musculoskeletal:     ?   General: Normal range of motion.  ?   Cervical back: Normal range of motion.  ?   Right lower leg: No edema.  ?   Left lower leg: No edema.  ?Neurological:  ?   Mental Status: He is alert.  ? ?Laboratory examination:  ? ? ? ?  Latest Ref Rng & Units 01/28/2022  ?  3:00 PM 01/01/2022  ?  2:03 PM 11/23/2021  ?  3:14 PM  ?CMP  ?Glucose 70 - 99 mg/dL 77   111   187    ?BUN 8 - 27 mg/dL 26   27   32    ?Creatinine 0.76 - 1.27 mg/dL 1.84   2.23   2.23    ?Sodium 134 - 144 mmol/L 141   140   137    ?Potassium 3.5 - 5.2 mmol/L 4.4   5.0   4.8    ?Chloride 96 - 106 mmol/L 105   102   101    ?CO2 20 - 29 mmol/L '23   24   21    '$ ?Calcium 8.6 - 10.2 mg/dL 8.9   9.8   9.4    ? ? ?  Latest Ref Rng & Units 08/22/2021  ?  1:15 AM 08/21/2021  ?  1:45 AM 04/30/2020  ? 11:00 AM  ?CBC  ?WBC 4.0 - 10.5 K/uL 7.8   10.3   6.0    ?Hemoglobin 13.0 - 17.0 g/dL 15.9   16.0   16.0    ?Hematocrit 39.0 - 52.0 % 48.8   48.8   51.0    ?Platelets 150 - 400 K/uL 214   211    181    ? ?Lipid Panel  ?No results found for: CHOL, TRIG, HDL, CHOLHDL, VLDL, LDLCALC, LDLDIRECT ?HEMOGLOBIN A1C ?Lab Results  ?Component Value Date  ? HGBA1C 6.6 (H) 08/21/2021  ? MPG 142.72 08/21/2021  ? ?TSH ?No r

## 2022-02-01 NOTE — Progress Notes (Signed)
Please send results to Dr. Posey Pronto patient's nephrologist

## 2022-02-05 ENCOUNTER — Other Ambulatory Visit: Payer: Self-pay | Admitting: Cardiology

## 2022-02-09 DIAGNOSIS — E039 Hypothyroidism, unspecified: Secondary | ICD-10-CM | POA: Diagnosis not present

## 2022-02-09 DIAGNOSIS — E1165 Type 2 diabetes mellitus with hyperglycemia: Secondary | ICD-10-CM | POA: Diagnosis not present

## 2022-02-09 DIAGNOSIS — I129 Hypertensive chronic kidney disease with stage 1 through stage 4 chronic kidney disease, or unspecified chronic kidney disease: Secondary | ICD-10-CM | POA: Diagnosis not present

## 2022-02-09 DIAGNOSIS — E7800 Pure hypercholesterolemia, unspecified: Secondary | ICD-10-CM | POA: Diagnosis not present

## 2022-02-09 DIAGNOSIS — D751 Secondary polycythemia: Secondary | ICD-10-CM | POA: Diagnosis not present

## 2022-02-22 DIAGNOSIS — M791 Myalgia, unspecified site: Secondary | ICD-10-CM | POA: Diagnosis not present

## 2022-02-22 DIAGNOSIS — R5383 Other fatigue: Secondary | ICD-10-CM | POA: Diagnosis not present

## 2022-02-22 DIAGNOSIS — M2550 Pain in unspecified joint: Secondary | ICD-10-CM | POA: Diagnosis not present

## 2022-02-23 ENCOUNTER — Telehealth: Payer: Self-pay

## 2022-02-23 NOTE — Telephone Encounter (Signed)
Please advise him to reach out to Dr. Serita Grit office directly and let him know I will give him a call when I have some down time. Just didn't want to wait until I called him back to let him know to call Dr. Ena Dawley office

## 2022-02-23 NOTE — Telephone Encounter (Signed)
Called pt and he is aware. 

## 2022-02-24 ENCOUNTER — Other Ambulatory Visit: Payer: Self-pay | Admitting: Student

## 2022-02-26 ENCOUNTER — Telehealth: Payer: Self-pay

## 2022-02-26 NOTE — Telephone Encounter (Signed)
I have called Dr. Serita Grit office directly and requested that they follow up regarding an appointment. Coralyn Mark the scheduler should be reaching out

## 2022-03-04 DIAGNOSIS — E1165 Type 2 diabetes mellitus with hyperglycemia: Secondary | ICD-10-CM | POA: Diagnosis not present

## 2022-03-08 DIAGNOSIS — I130 Hypertensive heart and chronic kidney disease with heart failure and stage 1 through stage 4 chronic kidney disease, or unspecified chronic kidney disease: Secondary | ICD-10-CM | POA: Diagnosis not present

## 2022-03-08 DIAGNOSIS — E1121 Type 2 diabetes mellitus with diabetic nephropathy: Secondary | ICD-10-CM | POA: Diagnosis not present

## 2022-03-08 DIAGNOSIS — E782 Mixed hyperlipidemia: Secondary | ICD-10-CM | POA: Diagnosis not present

## 2022-03-08 DIAGNOSIS — N1832 Chronic kidney disease, stage 3b: Secondary | ICD-10-CM | POA: Diagnosis not present

## 2022-03-09 ENCOUNTER — Other Ambulatory Visit: Payer: Self-pay | Admitting: Student

## 2022-03-12 DIAGNOSIS — N1832 Chronic kidney disease, stage 3b: Secondary | ICD-10-CM | POA: Diagnosis not present

## 2022-03-15 ENCOUNTER — Ambulatory Visit: Payer: Medicare Other | Admitting: Student

## 2022-03-15 DIAGNOSIS — E782 Mixed hyperlipidemia: Secondary | ICD-10-CM | POA: Diagnosis not present

## 2022-03-15 DIAGNOSIS — I129 Hypertensive chronic kidney disease with stage 1 through stage 4 chronic kidney disease, or unspecified chronic kidney disease: Secondary | ICD-10-CM | POA: Diagnosis not present

## 2022-03-15 DIAGNOSIS — J432 Centrilobular emphysema: Secondary | ICD-10-CM | POA: Diagnosis not present

## 2022-03-15 DIAGNOSIS — D45 Polycythemia vera: Secondary | ICD-10-CM | POA: Diagnosis not present

## 2022-03-15 DIAGNOSIS — N1832 Chronic kidney disease, stage 3b: Secondary | ICD-10-CM | POA: Diagnosis not present

## 2022-03-15 DIAGNOSIS — I130 Hypertensive heart and chronic kidney disease with heart failure and stage 1 through stage 4 chronic kidney disease, or unspecified chronic kidney disease: Secondary | ICD-10-CM | POA: Diagnosis not present

## 2022-03-15 DIAGNOSIS — E1121 Type 2 diabetes mellitus with diabetic nephropathy: Secondary | ICD-10-CM | POA: Diagnosis not present

## 2022-03-15 DIAGNOSIS — I700 Atherosclerosis of aorta: Secondary | ICD-10-CM | POA: Diagnosis not present

## 2022-03-15 DIAGNOSIS — D692 Other nonthrombocytopenic purpura: Secondary | ICD-10-CM | POA: Diagnosis not present

## 2022-03-15 DIAGNOSIS — I25118 Atherosclerotic heart disease of native coronary artery with other forms of angina pectoris: Secondary | ICD-10-CM | POA: Diagnosis not present

## 2022-03-15 DIAGNOSIS — I5042 Chronic combined systolic (congestive) and diastolic (congestive) heart failure: Secondary | ICD-10-CM | POA: Diagnosis not present

## 2022-03-16 ENCOUNTER — Encounter: Payer: Self-pay | Admitting: Student

## 2022-03-16 ENCOUNTER — Ambulatory Visit: Payer: Medicare Other | Admitting: Student

## 2022-03-16 VITALS — BP 117/60 | HR 64 | Temp 98.0°F | Resp 17 | Ht 74.0 in | Wt 185.6 lb

## 2022-03-16 DIAGNOSIS — I25708 Atherosclerosis of coronary artery bypass graft(s), unspecified, with other forms of angina pectoris: Secondary | ICD-10-CM | POA: Diagnosis not present

## 2022-03-16 DIAGNOSIS — I5022 Chronic systolic (congestive) heart failure: Secondary | ICD-10-CM | POA: Diagnosis not present

## 2022-03-16 NOTE — Progress Notes (Signed)
? ?Primary Physician:  Merrilee Seashore, MD ? ? ?[name], male    DOB: 09-14-1946, 76 y.o.   MRN: [id number] ? ?Subjective:  ? ? ?Chief Complaint  ?Patient presents with  ? Coronary Artery Disease  ? hf  ?  6 weeks  ? ? ?HPI: Jesus Young  is a 76 y.o. male  with known coronary artery disease s/p CABG in 2012,  left bundle branch block that was noted  2014, hypertension, diabetes, hyperlipidemia and chronic kidney disease. History of Covid 19 infection during March 2022.  ? ?Patient echocardiogram in 06/2021 revealed LVEF of 30-35% had reduced further compared to 11/2020.  Patient then presented to [facility] emergency department with unstable angina 08/2021 and underwent successful PCI to SVG-RCA graft.  Since then uptitration of guideline directed medical therapy has been limited due to worsening renal function. ? ?Patient presents for 6-week follow-up.  He was seen by PCP yesterday who noted proBNP to be elevated as well as creatinine continuing to increase, PCP advised patient to take Lasix 80 mg once daily.  Patient has upcoming appointment with nephrology. ? ?Overall Jesus Young is feeling improved compared to last office visit with more energy and less shortness of breath.  Denies orthopnea, swelling.  He is able to exert himself more recently without issue. ? ? ?Patient did have labs done with nephrology at the end of last week and is awaiting results. ? ?Past Medical History:  ?Diagnosis Date  ? Anxiety   ? Arthritis   ? back and knees  ? Bronchitis   ? hx of 2015  ? Cancer [facility])   ? prostate  ? Cataract   ? immature on right  ? Chronic back pain   ? CKD (chronic kidney disease)   ? Constipation   ? takes Colace at bedtime  ? Coronary artery disease   ? takes Plavix daily but is on hold for surgery  ? Depression   ? Diabetes mellitus without complication ([place])   ? takes Levemir and Humalog daily  ? Dizziness   ? Fall   ? hx of with injury to left shoulder   ? GERD  (gastroesophageal reflux disease)   ? takes Protonix daily and Zantac  ? H/O urinary frequency   ? Hemorrhoids   ? History of bladder infections   ? History of kidney stones   ? Hyperlipidemia   ? takes Zocor every evening  ? Hypertension   ? takes Metoprolol daily  ? Hypothyroidism   ? takes Synthroid daily  ? Left bundle branch block   ? Myocardial infarction [facility])   ? 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 Night muscle spasms   ? takes Robaxin 4 x day   ? Nocturia   ? ?Past Surgical History:  ?Procedure Laterality Date  ? CARDIAC CATHETERIZATION  05/17/02  ? COLONOSCOPY    ? CORONARY ANGIOPLASTY    ? x 1   ? CORONARY ARTERY BYPASS GRAFT  2002  ? x 5  ? eyelid surgery     ? INGUINAL HERNIA REPAIR Left 05/06/2020  ? Procedure: LEFT INGUINAL HERNIA REPAIR;  Surgeon: Donnie Mesa, MD;  Location: [place];  Service: General;  Laterality: Left;  ? INSERTION OF MESH Left 05/06/2020  ? Procedure: INSERTION OF MESH;  Surgeon: Donnie Mesa, MD;  Location: [place];  Service: General;  Laterality: Left;  ? LEFT HEART CATH AND CORS/GRAFTS ANGIOGRAPHY N/A 12/05/2018  ? Procedure: LEFT HEART CATH AND CORS/GRAFTS  ANGIOGRAPHY;  Surgeon: Adrian Prows, MD;  Location: [place] CV [facility];  Service: Cardiovascular;  Laterality: N/A;  ? LEFT HEART CATH AND CORS/GRAFTS ANGIOGRAPHY N/A 08/21/2021  ? Procedure: LEFT HEART CATH AND CORS/GRAFTS ANGIOGRAPHY;  Surgeon: Adrian Prows, MD;  Location: [place] [facility];  Service: Cardiovascular;  Laterality: N/A;  ? LUMBAR LAMINECTOMY/DECOMPRESSION MICRODISCECTOMY Bilateral 07/18/2013  ? Procedure: Bilateral Lumbar four-five Lumbar Laminotomy/Microdiskectomy;  Surgeon: Hosie Spangle, MD;  Location: [place] NE[facility];  Service: Neurosurgery;  Laterality: Bilateral;  Bilateral Lumbar four-five Lumbar Laminotomy/Microdiskectomy  ? NEPHROLITHOTOMY Left 06/23/2015  ? Procedure: NEPHROLITHOTOMY PERCUTANEOUS;  Surgeon: [name], MD;  Location: [facility];  Service: Urology;  Laterality: Left;  ? PROSTATECTOMY  10/2009  ? skin spots  removed from back -precancerous    ? TONSILLECTOMY    ? ?Family History  ?Problem Relation Age of Onset  ? Heart disease Father   ? Heart attack Father   ? Thyroid disease Sister   ? ?Social History  ? ?Tobacco Use  ? Smoking status: Never  ? Smokeless tobacco: Never  ?Substance Use Topics  ? Alcohol use: No  ?Martial status: Married ? ?ROS  ? ?Review of Systems  ?Constitutional: Negative for malaise/fatigue and weight gain.  ?Cardiovascular:  Positive for dyspnea on exertion (improved). Negative for chest pain, claudication, leg swelling, near-syncope, orthopnea, palpitations, paroxysmal nocturnal dyspnea and syncope.  ?Musculoskeletal:   ?     No mild left arm discomfort  ?   ?Objective:  ?Blood pressure 117/60, pulse 64, temperature 98 ?F (36.7 ?C), temperature source Temporal, resp. rate 17, height '6\' 2"'$  (1.88 m), weight 185 lb 9.6 oz (84.2 kg), SpO2 96 %. Body mass index is 23.83 kg/m?. ?  ?Physical Exam ?Vitals reviewed.  ?Constitutional:   ?   General: He is not in acute distress. ?   Appearance: He is well-developed.  ?Cardiovascular:  ?   Rate and Rhythm: Normal rate and regular rhythm.  ?   Pulses: Intact distal pulses.     ?     Carotid pulses are 2+ on the right side and 2+ on the left side. ?     Radial pulses are 2+ on the right side and 2+ on the left side.  ?     Femoral pulses are 2+ on the right side and 2+ on the left side. ?     Popliteal pulses are 2+ on the right side and 2+ on the left side.  ?     Dorsalis pedis pulses are 2+ on the right side and 2+ on the left side.  ?     Posterior tibial pulses are 2+ on the right side and 2+ on the left side.  ?   Heart sounds: Normal heart sounds, S1 normal and S2 normal. No murmur heard. ?  No gallop.  ?Pulmonary:  ?   Effort: Pulmonary effort is normal. No accessory muscle usage or respiratory distress.  ?   Breath sounds: Normal breath sounds. No wheezing, rhonchi or rales.  ?Musculoskeletal:  ?   Cervical back: Normal range of motion.  ?   Right  lower leg: Edema (trace) present.  ?   Left lower leg: Edema (trace) present.  ?Neurological:  ?   Mental Status: He is alert.  ? ?Laboratory examination:  ? ? ? ?  Latest Ref Rng & Units 01/28/2022  ?  3:00 PM 01/01/2022  ?  2:03 PM 11/23/2021  ?  3:14 PM  ?CMP  ?Glucose 70 -  99 mg/dL 77   111   187    ?BUN 8 - 27 mg/dL 26   27   32    ?Creatinine 0.76 - 1.27 mg/dL 1.84   2.23   2.23    ?Sodium 134 - 144 mmol/L 141   140   137    ?Potassium 3.5 - 5.2 mmol/L 4.4   5.0   4.8    ?Chloride 96 - 106 mmol/L 105   102   101    ?CO2 20 - 29 mmol/L '23   24   21    '$ ?Calcium 8.6 - 10.2 mg/dL 8.9   9.8   9.4    ? ? ?  Latest Ref Rng & Units 08/22/2021  ?  1:15 AM 08/21/2021  ?  1:45 AM 04/30/2020  ? 11:00 AM  ?CBC  ?WBC 4.0 - 10.5 K/uL 7.8   10.3   6.0    ?Hemoglobin 13.0 - 17.0 g/dL 15.9   16.0   16.0    ?Hematocrit 39.0 - 52.0 % 48.8   48.8   51.0    ?Platelets 150 - 400 K/uL 214   211   181    ? ?Lipid Panel  ?No results found for: CHOL, TRIG, HDL, CHOLHDL, VLDL, LDLCALC, LDLDIRECT ? ?HEMOGLOBIN A1C ?Lab Results  ?Component Value Date  ? HGBA1C 6.6 (H) 08/21/2021  ? MPG 142.72 08/21/2021  ? ?TSH ?No results for input(s): TSH in the last 8760 hours. ? ?External Labs:  ?03/08/2022: ?Total cholesterol 134, HDL 36, LDL 56, triglycerides 209 ?BUN 40, creatinine 2.51, GFR 24, potassium 3.8, sodium 142 ?Hgb 16.3, HCT 48.1, MCV 93.6, platelet 204 ?NT proBNP 3892 ? ?10/02/2021: ?BUN 33, creatinine 1.84, GFR 38, sodium 137, potassium 4.6 ?Hgb 17.3, HCT 51.5, MCV 94, platelet 216 ? ?10/21/2020: ?HDL 33, LDL 70, total cholesterol 132, triglycerides 167 ?TSH 1.62 ?BUN 28 ? ?04/30/2020: ?A1c 6.9%, serum creatinine 1.76 ? ?Allergies  ? ?Allergies  ?Allergen Reactions  ? Tetanus Antitoxin Other (See Comments)  ? Clonazepam Other (See Comments)  ?  Headache ?  ? Lisinopril Other (See Comments)  ?  headache  ?  ?Medications Prior to Visit:  ? ?Outpatient Medications Prior to Visit  ?Medication Sig Dispense Refill  ? acetaminophen (TYLENOL) 650 MG CR  tablet Take 1,300 mg by mouth in the morning and at bedtime.    ? amLODipine (NORVASC) 5 MG tablet TAKE 2 TABLETS(10 MG) BY MOUTH DAILY 180 tablet 0  ? aspirin EC 81 MG tablet Take 81 mg by mouth at bedtime.     ? B

## 2022-03-17 ENCOUNTER — Other Ambulatory Visit: Payer: Self-pay | Admitting: Student

## 2022-03-22 ENCOUNTER — Other Ambulatory Visit: Payer: Self-pay | Admitting: Student

## 2022-03-22 DIAGNOSIS — I25118 Atherosclerotic heart disease of native coronary artery with other forms of angina pectoris: Secondary | ICD-10-CM

## 2022-03-29 IMAGING — DX DG CHEST 1V PORT
1 series · 1 of 1 positions shown · non-contrast
Comparison: 10/29/2020, CT from 01/09/2021

CLINICAL DATA: Chest pain for several hours, initial encounter

EXAM:
PORTABLE CHEST 1 VIEW

[chest]
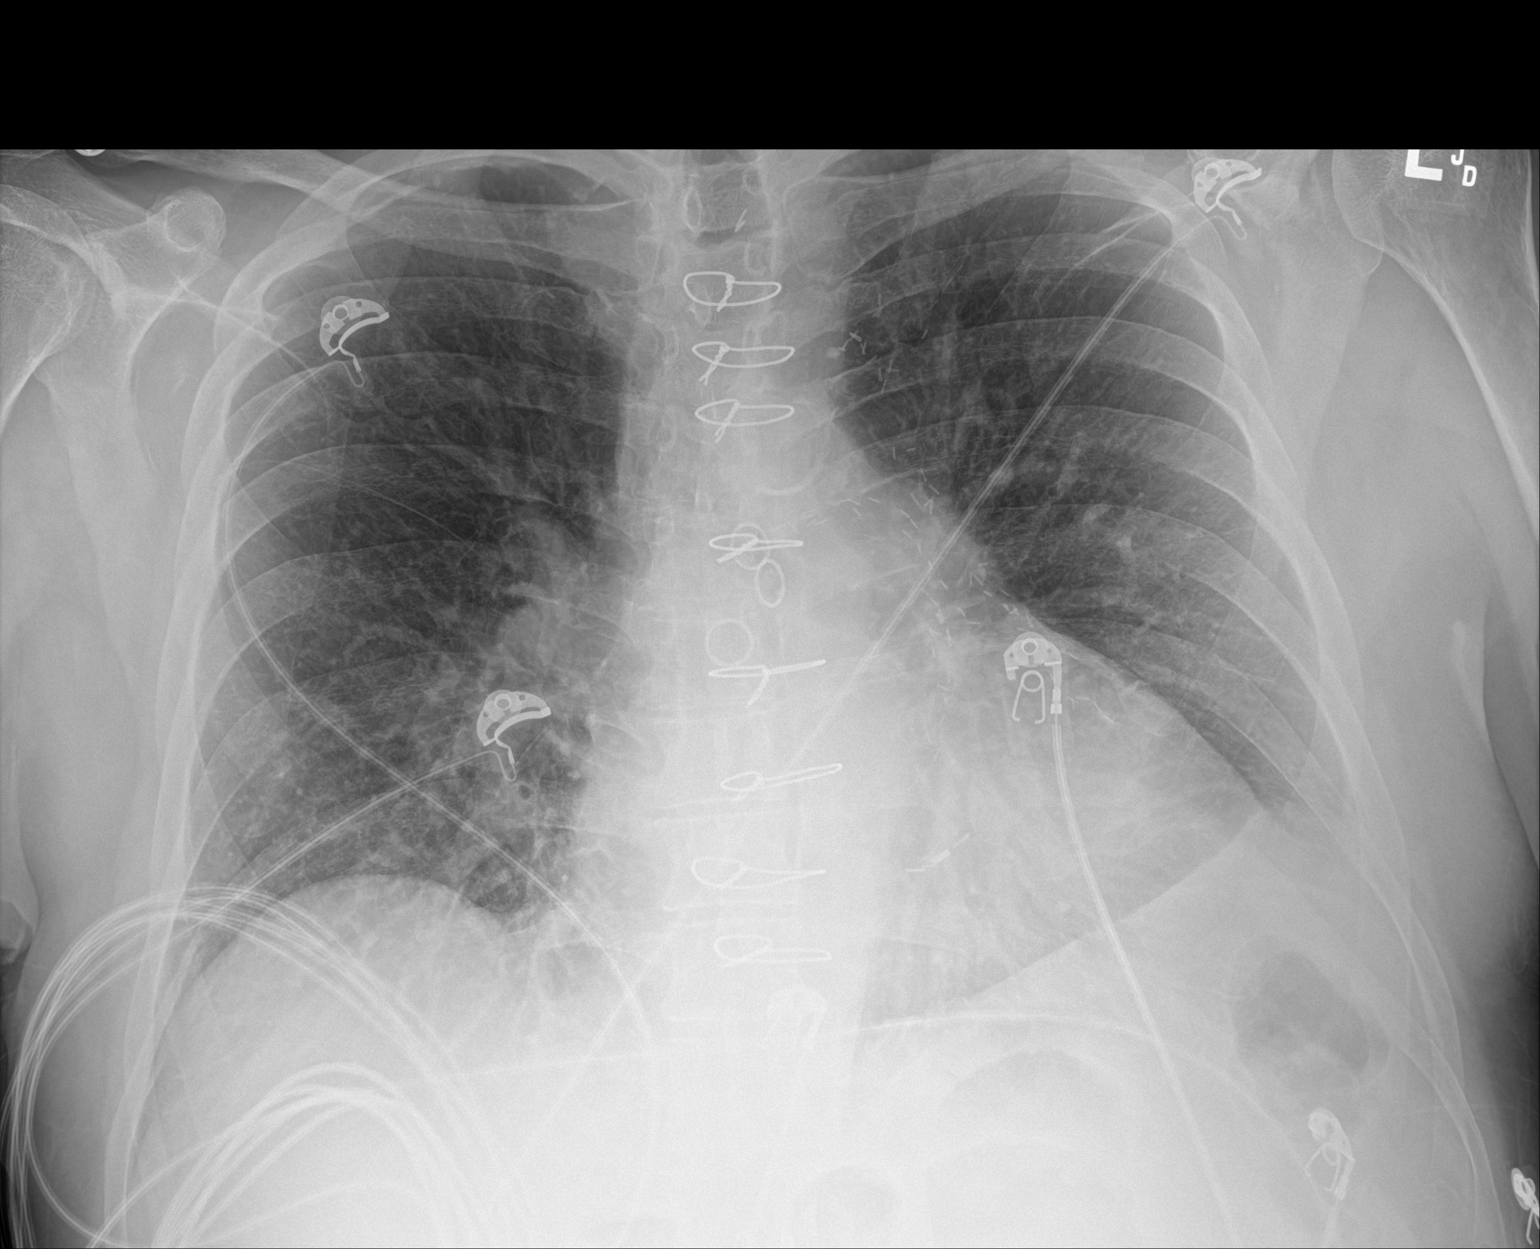

[1 of 1 positions shown; findings below may reference images not displayed]

FINDINGS: Cardiac shadow is mildly enlarged. Changes of prior coronary bypass
grafting are again seen. Lungs are well aerated bilaterally. Mild
elevation of the left hemidiaphragm is seen. No focal infiltrate or
sizable effusion is noted. No bony abnormality is seen.
IMPRESSION: No active disease.

## 2022-04-03 DIAGNOSIS — E1165 Type 2 diabetes mellitus with hyperglycemia: Secondary | ICD-10-CM | POA: Diagnosis not present

## 2022-04-09 DIAGNOSIS — N1832 Chronic kidney disease, stage 3b: Secondary | ICD-10-CM | POA: Diagnosis not present

## 2022-04-15 DIAGNOSIS — I129 Hypertensive chronic kidney disease with stage 1 through stage 4 chronic kidney disease, or unspecified chronic kidney disease: Secondary | ICD-10-CM | POA: Diagnosis not present

## 2022-04-15 DIAGNOSIS — I5022 Chronic systolic (congestive) heart failure: Secondary | ICD-10-CM | POA: Diagnosis not present

## 2022-04-15 DIAGNOSIS — D631 Anemia in chronic kidney disease: Secondary | ICD-10-CM | POA: Diagnosis not present

## 2022-04-15 DIAGNOSIS — N2581 Secondary hyperparathyroidism of renal origin: Secondary | ICD-10-CM | POA: Diagnosis not present

## 2022-04-15 DIAGNOSIS — N184 Chronic kidney disease, stage 4 (severe): Secondary | ICD-10-CM | POA: Diagnosis not present

## 2022-04-16 ENCOUNTER — Other Ambulatory Visit: Payer: Self-pay | Admitting: Student

## 2022-04-22 ENCOUNTER — Encounter: Payer: Self-pay | Admitting: Podiatry

## 2022-04-22 ENCOUNTER — Ambulatory Visit: Payer: Medicare Other | Admitting: Podiatry

## 2022-04-22 DIAGNOSIS — D2372 Other benign neoplasm of skin of left lower limb, including hip: Secondary | ICD-10-CM

## 2022-04-22 DIAGNOSIS — M79676 Pain in unspecified toe(s): Secondary | ICD-10-CM | POA: Diagnosis not present

## 2022-04-22 DIAGNOSIS — D2371 Other benign neoplasm of skin of right lower limb, including hip: Secondary | ICD-10-CM

## 2022-04-22 DIAGNOSIS — E119 Type 2 diabetes mellitus without complications: Secondary | ICD-10-CM | POA: Diagnosis not present

## 2022-04-22 DIAGNOSIS — B351 Tinea unguium: Secondary | ICD-10-CM | POA: Diagnosis not present

## 2022-04-22 DIAGNOSIS — D689 Coagulation defect, unspecified: Secondary | ICD-10-CM | POA: Diagnosis not present

## 2022-04-22 NOTE — Progress Notes (Signed)
He presents today chief complaint of painful elongated toenails.  Objective: Pulses remain strong and palpable.  Capillary fill time is immediate.  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  Assessment: Pain in limb secondary to onychomycosis.  Plan: Debridement of toenails 1 through 5 bilateral.

## 2022-05-03 DIAGNOSIS — E1165 Type 2 diabetes mellitus with hyperglycemia: Secondary | ICD-10-CM | POA: Diagnosis not present

## 2022-05-08 ENCOUNTER — Other Ambulatory Visit: Payer: Self-pay | Admitting: Student

## 2022-05-08 DIAGNOSIS — I25118 Atherosclerotic heart disease of native coronary artery with other forms of angina pectoris: Secondary | ICD-10-CM

## 2022-05-23 ENCOUNTER — Other Ambulatory Visit: Payer: Self-pay | Admitting: Cardiology

## 2022-05-23 ENCOUNTER — Other Ambulatory Visit: Payer: Self-pay | Admitting: Student

## 2022-06-02 DIAGNOSIS — E1165 Type 2 diabetes mellitus with hyperglycemia: Secondary | ICD-10-CM | POA: Diagnosis not present

## 2022-06-09 ENCOUNTER — Encounter: Payer: Self-pay | Admitting: Student

## 2022-06-09 ENCOUNTER — Ambulatory Visit: Payer: Medicare Other | Admitting: Student

## 2022-06-09 VITALS — BP 106/57 | HR 72 | Temp 97.6°F | Resp 16 | Ht 74.0 in | Wt 178.0 lb

## 2022-06-09 DIAGNOSIS — E782 Mixed hyperlipidemia: Secondary | ICD-10-CM | POA: Diagnosis not present

## 2022-06-09 DIAGNOSIS — I5022 Chronic systolic (congestive) heart failure: Secondary | ICD-10-CM | POA: Diagnosis not present

## 2022-06-09 DIAGNOSIS — N183 Chronic kidney disease, stage 3 unspecified: Secondary | ICD-10-CM | POA: Diagnosis not present

## 2022-06-09 DIAGNOSIS — E1122 Type 2 diabetes mellitus with diabetic chronic kidney disease: Secondary | ICD-10-CM

## 2022-06-09 DIAGNOSIS — I25708 Atherosclerosis of coronary artery bypass graft(s), unspecified, with other forms of angina pectoris: Secondary | ICD-10-CM

## 2022-06-09 DIAGNOSIS — E039 Hypothyroidism, unspecified: Secondary | ICD-10-CM | POA: Diagnosis not present

## 2022-06-09 DIAGNOSIS — E1165 Type 2 diabetes mellitus with hyperglycemia: Secondary | ICD-10-CM | POA: Diagnosis not present

## 2022-06-09 MED ORDER — ISOSORBIDE MONONITRATE ER 60 MG PO TB24: 60.0000 mg | ORAL_TABLET | Freq: Every day | ORAL | 3 refills | Status: DC

## 2022-06-09 NOTE — Progress Notes (Signed)
Primary Physician:  Merrilee Seashore, MD   [name], male    DOB: March 03, 1946, 76 y.o.   MRN: [id number]  Subjective:    Chief Complaint  Patient presents with   Congestive Heart Failure   Coronary Artery Disease   Follow-up    3 month    HPI: Jesus Young  is a 76 y.o. male  with known coronary artery disease s/p CABG in 2012,  left bundle branch block that was noted  2014, hypertension, diabetes, hyperlipidemia and chronic kidney disease. History of Covid 19 infection during March 2022.   Patient echocardiogram in 06/2021 revealed LVEF of 30-35% had reduced further compared to 11/2020.  Patient then presented to [facility] emergency department with unstable angina 08/2021 and underwent successful PCI to SVG-RCA graft.  Since then uptitration of guideline directed medical therapy has been limited due to worsening renal function.  Patient was last seen in the office 03/16/2022 at which time BNP was elevated but patient had improved symptomatically and appeared euvolemic on exam. Given continued worsening renal function and upcoming appointment with nephrology no changes were made at last office visit. He now present for 3 month follow up.  Patient reports from a symptom standpoint he is feeling quite well with only minimal dyspnea on exertion and no significant chest pain.  He is currently taking Lasix 80 mg p.o. daily.  Denies orthopnea, PND, leg edema.  He has upcoming appointment with nephrology scheduled for next week.  His blood pressure is soft in the office today, however he denies dizziness.  Past Medical History:  Diagnosis Date   Anxiety    Arthritis    back and knees   Bronchitis    hx of 2015   Cancer ([place])    prostate   Cataract    immature on right   Chronic back pain    CKD (chronic kidney disease)    Constipation    takes Colace at bedtime   Coronary artery disease    takes Plavix daily but is on hold for surgery   Depression     Diabetes mellitus without complication (HCC)    takes Levemir and Humalog daily   Dizziness    Fall    hx of with injury to left shoulder    GERD (gastroesophageal reflux disease)    takes Protonix daily and Zantac   H/O urinary frequency    Hemorrhoids    History of bladder infections    History of kidney stones    Hyperlipidemia    takes Zocor every evening   Hypertension    takes Metoprolol daily   Hypothyroidism    takes Synthroid daily   Left bundle branch block    Myocardial infarction [facility])    many yrs before 2002   Night muscle spasms    takes Robaxin 4 x day    Nocturia    Past Surgical History:  Procedure Laterality Date   CARDIAC CATHETERIZATION  05/17/02   COLONOSCOPY     CORONARY ANGIOPLASTY     x 1    CORONARY ARTERY BYPASS GRAFT  2002   x 5   eyelid surgery      INGUINAL HERNIA REPAIR Left 05/06/2020   Procedure: LEFT INGUINAL HERNIA REPAIR;  Surgeon: Donnie Mesa, MD;  Location: [place];  Service: General;  Laterality: Left;   INSERTION OF MESH Left 05/06/2020   Procedure: INSERTION OF MESH;  Surgeon: Donnie Mesa, MD;  Location: [place];  Service: General;  Laterality: Left;   LEFT HEART CATH AND CORS/GRAFTS ANGIOGRAPHY N/A 12/05/2018   Procedure: LEFT HEART CATH AND CORS/GRAFTS ANGIOGRAPHY;  Surgeon: Adrian Prows, MD;  Location: [place] CV [facility];  Service: Cardiovascular;  Laterality: N/A;   LEFT HEART CATH AND CORS/GRAFTS ANGIOGRAPHY N/A 08/21/2021   Procedure: LEFT HEART CATH AND CORS/GRAFTS ANGIOGRAPHY;  Surgeon: Adrian Prows, MD;  Location: [place] [facility];  Service: Cardiovascular;  Laterality: N/A;   LUMBAR LAMINECTOMY/DECOMPRESSION MICRODISCECTOMY Bilateral 07/18/2013   Procedure: Bilateral Lumbar four-five Lumbar Laminotomy/Microdiskectomy;  Surgeon: Hosie Spangle, MD;  Location: [place] [facility];  Service: Neurosurgery;  Laterality: Bilateral;  Bilateral Lumbar four-five Lumbar Laminotomy/Microdiskectomy   NEPHROLITHOTOMY Left 06/23/2015   Procedure:  NEPHROLITHOTOMY PERCUTANEOUS;  Surgeon: [name], MD;  Location: [facility];  Service: Urology;  Laterality: Left;   PROSTATECTOMY  10/2009   skin spots removed from back -precancerous     TONSILLECTOMY     Family History  Problem Relation Age of Onset   Heart disease Father    Heart attack Father    Thyroid disease Sister    Social History   Tobacco Use   Smoking status: Never   Smokeless tobacco: Never  Substance Use Topics   Alcohol use: No  Martial status: Married  ROS   Review of Systems  Constitutional: Negative for malaise/fatigue and weight gain.  Cardiovascular:  Positive for dyspnea on exertion (improved). Negative for chest pain, claudication, leg swelling, near-syncope, orthopnea, palpitations, paroxysmal nocturnal dyspnea and syncope.      Objective:  Blood pressure (!) 106/57, pulse 72, temperature 97.6 F (36.4 C), resp. rate 16, height '6\' 2"'$  (1.88 m), weight 178 lb (80.7 kg), SpO2 96 %. Body mass index is 22.85 kg/m.   Physical Exam Vitals reviewed.  Constitutional:      General: He is not in acute distress.    Appearance: He is well-developed.  Cardiovascular:     Rate and Rhythm: Normal rate and regular rhythm.     Pulses: Intact distal pulses.          Carotid pulses are 2+ on the right side and 2+ on the left side.      Radial pulses are 2+ on the right side and 2+ on the left side.       Femoral pulses are 2+ on the right side and 2+ on the left side.      Popliteal pulses are 2+ on the right side and 2+ on the left side.       Dorsalis pedis pulses are 2+ on the right side and 2+ on the left side.       Posterior tibial pulses are 2+ on the right side and 2+ on the left side.     Heart sounds: Normal heart sounds, S1 normal and S2 normal. No murmur heard.    No gallop.  Pulmonary:     Effort: Pulmonary effort is normal. No accessory muscle usage or respiratory distress.     Breath sounds: Normal breath sounds. No wheezing, rhonchi or rales.   Musculoskeletal:     Cervical back: Normal range of motion.     Right lower leg: No edema.     Left lower leg: No edema.  Neurological:     Mental Status: He is alert.    Laboratory examination:       Latest Ref Rng & Units 01/28/2022    3:00 PM 01/01/2022    2:03 PM 11/23/2021    3:14  PM  CMP  Glucose 70 - 99 mg/dL 77  111  187   BUN 8 - 27 mg/dL 26  27  32   Creatinine 0.76 - 1.27 mg/dL 1.84  2.23  2.23   Sodium 134 - 144 mmol/L 141  140  137   Potassium 3.5 - 5.2 mmol/L 4.4  5.0  4.8   Chloride 96 - 106 mmol/L 105  102  101   CO2 20 - 29 mmol/L '23  24  21   '$ Calcium 8.6 - 10.2 mg/dL 8.9  9.8  9.4       Latest Ref Rng & Units 08/22/2021    1:15 AM 08/21/2021    1:45 AM 04/30/2020   11:00 AM  CBC  WBC 4.0 - 10.5 K/uL 7.8  10.3  6.0   Hemoglobin 13.0 - 17.0 g/dL 15.9  16.0  16.0   Hematocrit 39.0 - 52.0 % 48.8  48.8  51.0   Platelets 150 - 400 K/uL 214  211  181    Lipid Panel  No results found for: "CHOL", "TRIG", "HDL", "CHOLHDL", "VLDL", "LDLCALC", "LDLDIRECT"  HEMOGLOBIN A1C Lab Results  Component Value Date   HGBA1C 6.6 (H) 08/21/2021   MPG 142.72 08/21/2021   TSH No results for input(s): "TSH" in the last 8760 hours.  External Labs:  03/08/2022: Total cholesterol 134, HDL 36, LDL 56, triglycerides 209 BUN 40, creatinine 2.51, GFR 24, potassium 3.8, sodium 142 Hgb 16.3, HCT 48.1, MCV 93.6, platelet 204 NT proBNP 3892  10/02/2021: BUN 33, creatinine 1.84, GFR 38, sodium 137, potassium 4.6 Hgb 17.3, HCT 51.5, MCV 94, platelet 216  10/21/2020: HDL 33, LDL 70, total cholesterol 132, triglycerides 167 TSH 1.62 BUN 28  04/30/2020: A1c 6.9%, serum creatinine 1.76  Allergies   Allergies  Allergen Reactions   Tetanus Antitoxin Other (See Comments)   Clonazepam Other (See Comments)    Headache    Lisinopril Other (See Comments)    headache    Medications Prior to Visit:   Outpatient Medications Prior to Visit  Medication Sig Dispense Refill    acetaminophen (TYLENOL) 650 MG CR tablet Take 1,300 mg by mouth in the morning and at bedtime.     aspirin EC 81 MG tablet Take 81 mg by mouth at bedtime.      B-D ULTRAFINE III SHORT PEN 31G X 8 MM MISC USE UTD  0   BRILINTA 90 MG TABS tablet TAKE 1 TABLET(90 MG) BY MOUTH TWICE DAILY 60 tablet 0   busPIRone (BUSPAR) 10 MG tablet Take 10 mg by mouth 2 (two) times daily.     Docusate Sodium (GENTLE STOOL SOFTENER PO) Take 100 mg by mouth daily.     furosemide (LASIX) 40 MG tablet Take 1 tablet (40 mg total) by mouth daily as needed for fluid or edema. (Patient taking differently: Take 80 mg by mouth daily as needed for fluid or edema.) 30 tablet 3   insulin detemir (LEVEMIR) 100 UNIT/ML injection Inject 0.16 mLs (16 Units total) into the skin 2 (two) times daily. (Patient taking differently: Inject 18 Units into the skin 2 (two) times daily.) 10 mL 1   insulin lispro (HUMALOG) 100 UNIT/ML injection Inject 0.05-0.09 mLs (5-9 Units total) into the skin 2 (two) times daily. 5 units at lunch, and 9 units after dinner. (Patient taking differently: Inject 5-15 Units into the skin 2 (two) times daily. 5-15 units as needed  (sliding scale insulin)) 10 mL 1   levothyroxine (SYNTHROID, LEVOTHROID)  100 MCG tablet Take 100 mcg by mouth See admin instructions. Take 1 tablet (100 mcg) by mouth daily in the morning, except on Sundays     meclizine (ANTIVERT) 25 MG tablet Take 25 mg by mouth 3 (three) times daily as needed.     metoprolol (LOPRESSOR) 50 MG tablet Take 50 mg by mouth 2 (two) times daily.      Multiple Vitamin (MULTIVITAMIN) tablet Take 1 tablet by mouth daily.     Multiple Vitamins-Minerals (CENTRUM SILVER) tablet Take 1 tablet by mouth daily.     nitroGLYCERIN (NITROSTAT) 0.4 MG SL tablet Place 1 tablet (0.4 mg total) under the tongue every 5 (five) minutes as needed for chest pain. 15 tablet 3   ONE TOUCH ULTRA TEST test strip      pantoprazole (PROTONIX) 40 MG tablet Take 40 mg by mouth at bedtime.       ranolazine (RANEXA) 1000 MG SR tablet Take 1 tablet (1,000 mg total) by mouth 2 (two) times daily. 180 tablet 3   rosuvastatin (CRESTOR) 40 MG tablet Take 80 mg by mouth daily.     isosorbide mononitrate (IMDUR) 120 MG 24 hr tablet TAKE 1 TABLET(120 MG) BY MOUTH DAILY 90 tablet 3   amLODipine (NORVASC) 5 MG tablet TAKE 2 TABLETS(10 MG) BY MOUTH DAILY 180 tablet 0   famotidine (PEPCID) 40 MG tablet Take 40 mg by mouth daily.     No facility-administered medications prior to visit.   Final Medications at End of Visit    Current Meds  Medication Sig   acetaminophen (TYLENOL) 650 MG CR tablet Take 1,300 mg by mouth in the morning and at bedtime.   aspirin EC 81 MG tablet Take 81 mg by mouth at bedtime.    B-D ULTRAFINE III SHORT PEN 31G X 8 MM MISC USE UTD   BRILINTA 90 MG TABS tablet TAKE 1 TABLET(90 MG) BY MOUTH TWICE DAILY   busPIRone (BUSPAR) 10 MG tablet Take 10 mg by mouth 2 (two) times daily.   Docusate Sodium (GENTLE STOOL SOFTENER PO) Take 100 mg by mouth daily.   furosemide (LASIX) 40 MG tablet Take 1 tablet (40 mg total) by mouth daily as needed for fluid or edema. (Patient taking differently: Take 80 mg by mouth daily as needed for fluid or edema.)   insulin detemir (LEVEMIR) 100 UNIT/ML injection Inject 0.16 mLs (16 Units total) into the skin 2 (two) times daily. (Patient taking differently: Inject 18 Units into the skin 2 (two) times daily.)   insulin lispro (HUMALOG) 100 UNIT/ML injection Inject 0.05-0.09 mLs (5-9 Units total) into the skin 2 (two) times daily. 5 units at lunch, and 9 units after dinner. (Patient taking differently: Inject 5-15 Units into the skin 2 (two) times daily. 5-15 units as needed  (sliding scale insulin))   isosorbide mononitrate (IMDUR) 60 MG 24 hr tablet Take 1 tablet (60 mg total) by mouth daily.   levothyroxine (SYNTHROID, LEVOTHROID) 100 MCG tablet Take 100 mcg by mouth See admin instructions. Take 1 tablet (100 mcg) by mouth daily in the morning,  except on Sundays   meclizine (ANTIVERT) 25 MG tablet Take 25 mg by mouth 3 (three) times daily as needed.   metoprolol (LOPRESSOR) 50 MG tablet Take 50 mg by mouth 2 (two) times daily.    Multiple Vitamin (MULTIVITAMIN) tablet Take 1 tablet by mouth daily.   Multiple Vitamins-Minerals (CENTRUM SILVER) tablet Take 1 tablet by mouth daily.   nitroGLYCERIN (NITROSTAT) 0.4 MG SL tablet  Place 1 tablet (0.4 mg total) under the tongue every 5 (five) minutes as needed for chest pain.   ONE TOUCH ULTRA TEST test strip    pantoprazole (PROTONIX) 40 MG tablet Take 40 mg by mouth at bedtime.    ranolazine (RANEXA) 1000 MG SR tablet Take 1 tablet (1,000 mg total) by mouth 2 (two) times daily.   rosuvastatin (CRESTOR) 40 MG tablet Take 80 mg by mouth daily.   [DISCONTINUED] isosorbide mononitrate (IMDUR) 120 MG 24 hr tablet TAKE 1 TABLET(120 MG) BY MOUTH DAILY   Radiology:  No results found.  Cardiac Studies:    Coronary angiogram 12/05/2018: Left main diffusely diseased and occluded proximal LAD and circumflex pulmonary artery.  RCA occluded in the proximal segment.  SVG to RCA is patent, diffusely diseased PL branch with high-grade stenosis of 90 to 99% and PDA has about a 60 to 70% mid stenosis.  Small vessels. LAD is diffusely diseased.  It is occluded in the proximal segment.  Supplied by LIMA to LAD.  Distal to the insertion, there is a 60 to 70% diffuse disease.  Small vessel distally. Free radial graft to OM1 is widely patent.  Distal circumflex has a high-grade lesion, small vessel. SVG to D1 is occluded. Normal LV systolic function, mild posterior hypokinesis.  Normal LVEDP.  EF estimated at 55%.   PCV MYOCARDIAL PERFUSION WITH LEXISCAN 11/24/2020 Lexiscan nuclear stress test performed using 1-day protocol. Rest and stress EKG showed sinus rhythm, IVCD/tachycardia. Normal myocardial perfusion. Stress LVEF 25%. High risk study due to low stress LVEF. Further discuss with Dr. Einar Gip, suspects  that the LVEF is an error, likely secondary to known LBBB.   Left Heart Catheterization 08/21/21: Comparison 11/24/2018 LV: 124/7, EDP 13 mmHg.  Ao 122/70, mean 92 mmHg.  No pressure gradient across the aortic valve. LM: Diffusely diseased with occluded proximal LAD and proximal circumflex with minor branches evident. RCA: Occluded in the midsegment and severely diseased proximally. SVG to RCA: Proximal to mid segment has a ulcerated 80 to 90% stenosis.  Native vessel itself in the PL branch has a diffuse 90% stenosis and PDA has 60 to 70% stenosis.  This is unchanged from prior cardiac catheterization. Successful direct stenting with use of a spider filter wire for distal protection and implantation of a 4.0 x 16 mm Synergy XD at 16 atmospheric pressure, 60 seconds, stenosis reduced to 0%.  TIMI-3 to TIMI-3 flow. SVG to D1: Occluded. Free radial graft to OM1: Widely patent. LIMA to LAD: Widely patent.  There is mild to moderate disease in the native LAD which appears to have improved from prior cardiac catheterization.  Recommendation: Patient will be placed on dual antiplatelet therapy.  He will need to be on diuretics in the morning, please start Carrington Clamp next week once renal function is stable.  He can potentially go home either tomorrow if renal function is stable and he is hemodynamically stable.  60 mL contrast utilized.  Echocardiogram 11/26/2021:  Mildly depressed LV systolic function with visual EF 40-45%. Left  ventricle cavity is mildly dilated. Mild left ventricular hypertrophy.  Doppler evidence of grade I (impaired) diastolic dysfunction, normal LAP.  Left ventricle regional wall motion findings: Basal anteroseptal, Basal  anterior, Basal inferoseptal, Mid anteroseptal, Mid anterior, Mid  inferoseptal and Apical anterior hypokinesis.  Mild (Grade I) mitral regurgitation.  Mild pulmonic regurgitation.  Compared to study 07/08/2021 LVEF improved from 30-35% to 40-45% with RWMA,   Moderate MR is now mild, otherwise no significant change.  EKG:   08/27/2021: Sinus rhythm at a rate of 68 bpm.  Left atrial enlargement.  Left bundle branch block, no further analysis.  Compared to EKG 06/29/2021, no significant change.  Assessment:     ICD-10-CM   1. Chronic systolic (congestive) heart failure (HCC)  I50.22     2. Coronary artery disease of bypass graft of native heart with stable angina pectoris (HCC)  I25.708     3. CKD stage 3 due to type 2 diabetes mellitus (HCC)  E11.22    N18.30       Meds ordered this encounter  Medications   isosorbide mononitrate (IMDUR) 60 MG 24 hr tablet    Sig: Take 1 tablet (60 mg total) by mouth daily.    Dispense:  30 tablet    Refill:  3    Medications Discontinued During This Encounter  Medication Reason   amLODipine (NORVASC) 5 MG tablet    famotidine (PEPCID) 40 MG tablet    isosorbide mononitrate (IMDUR) 120 MG 24 hr tablet Dose change     Recommendations:   Jesus Young  is a 76 y.o. with known coronary artery disease s/p CABG in 2012,  left bundle branch block that was noted  2014, hypertension, diabetes, hyperlipidemia and chronic kidney disease. History of Covid 19 infection during March 2022.   Patient echocardiogram in 06/2021 revealed LVEF of 30-35% had reduced further compared to 11/2020.  Patient then presented to [facility] emergency department with unstable angina 08/2021 and underwent successful PCI to SVG-RCA graft.  Since then uptitration of guideline directed medical therapy has been limited due to worsening renal function.  Patient was last seen in the office 03/16/2022 at which time BNP was elevated but patient had improved symptomatically and appeared euvolemic on exam. Given continued worsening renal function and upcoming appointment with nephrology no changes were made at last office visit. He now present for 3 month follow up.  She is feeling well overall without significant symptoms and no  clinical evidence of acute heart failure.  He appears euvolemic on exam.  Patient is no longer on any Entresto/ARB/ACE inhibitor given continued worsening renal function.  Given low blood pressure will reduce isosorbide mononitrate from 120 mg to 60 mg p.o. daily.  Patient will monitor for recurrence of chest pain.  If he develops recurrence of chest pain could consider going back to 120 mg of Imdur and reducing metoprolol instead.  I would like to reduce antihypertensive medications in order to allow his blood pressure to increase some.  Will defer recheck of blood pressure to nephrology as he has an appointment with Dr. Posey Pronto next week.  Follow-up in 3 months, sooner if needed.   Alethia Berthold, PA-C 06/09/2022, 2:31 PM Office: [phone]

## 2022-06-16 DIAGNOSIS — N184 Chronic kidney disease, stage 4 (severe): Secondary | ICD-10-CM | POA: Diagnosis not present

## 2022-06-17 ENCOUNTER — Ambulatory Visit: Payer: Medicare Other | Admitting: Student

## 2022-06-24 DIAGNOSIS — N184 Chronic kidney disease, stage 4 (severe): Secondary | ICD-10-CM | POA: Diagnosis not present

## 2022-06-24 DIAGNOSIS — D631 Anemia in chronic kidney disease: Secondary | ICD-10-CM | POA: Diagnosis not present

## 2022-06-24 DIAGNOSIS — N2581 Secondary hyperparathyroidism of renal origin: Secondary | ICD-10-CM | POA: Diagnosis not present

## 2022-06-24 DIAGNOSIS — I129 Hypertensive chronic kidney disease with stage 1 through stage 4 chronic kidney disease, or unspecified chronic kidney disease: Secondary | ICD-10-CM | POA: Diagnosis not present

## 2022-06-28 ENCOUNTER — Telehealth: Payer: Self-pay

## 2022-06-28 ENCOUNTER — Other Ambulatory Visit: Payer: Self-pay

## 2022-06-28 MED ORDER — TICAGRELOR 90 MG PO TABS: ORAL_TABLET | ORAL | 3 refills | Status: DC

## 2022-06-28 NOTE — Telephone Encounter (Signed)
Needs refill on Brilinta. Completely out

## 2022-07-01 DIAGNOSIS — U071 COVID-19: Secondary | ICD-10-CM | POA: Diagnosis not present

## 2022-07-21 DIAGNOSIS — N184 Chronic kidney disease, stage 4 (severe): Secondary | ICD-10-CM | POA: Diagnosis not present

## 2022-08-04 DIAGNOSIS — N2581 Secondary hyperparathyroidism of renal origin: Secondary | ICD-10-CM | POA: Diagnosis not present

## 2022-08-04 DIAGNOSIS — I129 Hypertensive chronic kidney disease with stage 1 through stage 4 chronic kidney disease, or unspecified chronic kidney disease: Secondary | ICD-10-CM | POA: Diagnosis not present

## 2022-08-04 DIAGNOSIS — Z23 Encounter for immunization: Secondary | ICD-10-CM | POA: Diagnosis not present

## 2022-08-04 DIAGNOSIS — D631 Anemia in chronic kidney disease: Secondary | ICD-10-CM | POA: Diagnosis not present

## 2022-08-04 DIAGNOSIS — N184 Chronic kidney disease, stage 4 (severe): Secondary | ICD-10-CM | POA: Diagnosis not present

## 2022-08-18 NOTE — Progress Notes (Signed)
Patient Care Team: Merrilee Seashore, MD as PCP - General (Internal Medicine) Adrian Prows, MD as Consulting Physician (Cardiology)  DIAGNOSIS:  Encounter Diagnosis  Name Primary?   Polycythemia vera ([place])     CHIEF COMPLIANT: Follow-up polycythemia  INTERVAL HISTORY: Jesus Young is a 76 y.o. male with above-mentioned history of polycythemia. She presents to the clinic for a follow-up. His main concern is he might be taking too much of the lasix.  He does not have any signs or symptoms of elevated hemoglobin.  He denies any headaches or shortness of breath or fatigue.  He has been getting phlebotomies every 6 months.  His last phlebotomy was about a month ago.  He goes to the [facility] to donate his blood.  ALLERGIES:  is allergic to tetanus antitoxin, clonazepam, and lisinopril.  MEDICATIONS:  Current Outpatient Medications  Medication Sig Dispense Refill   acetaminophen (TYLENOL) 650 MG CR tablet Take 1,300 mg by mouth in the morning and at bedtime.     aspirin EC 81 MG tablet Take 81 mg by mouth at bedtime.      B-D ULTRAFINE III SHORT PEN 31G X 8 MM MISC USE UTD  0   busPIRone (BUSPAR) 10 MG tablet Take 10 mg by mouth 2 (two) times daily.     Calcium Citrate-Vitamin D 315-5 MG-MCG TABS Take by mouth.     Docusate Sodium (GENTLE STOOL SOFTENER PO) Take 100 mg by mouth daily.     famotidine (PEPCID) 40 MG tablet Take 1 tablet by mouth at bedtime.     insulin detemir (LEVEMIR) 100 UNIT/ML injection Inject 0.16 mLs (16 Units total) into the skin 2 (two) times daily. (Patient taking differently: Inject 18 Units into the skin 2 (two) times daily.) 10 mL 1   insulin lispro (HUMALOG) 100 UNIT/ML injection Inject 0.05-0.09 mLs (5-9 Units total) into the skin 2 (two) times daily. 5 units at lunch, and 9 units after dinner. (Patient taking differently: Inject 5-15 Units into the skin 2 (two) times daily. 5-15 units as needed  (sliding scale insulin)) 10 mL 1   isosorbide mononitrate  (IMDUR) 60 MG 24 hr tablet Take 1 tablet (60 mg total) by mouth daily. 30 tablet 3   LAGEVRIO 200 MG CAPS capsule SMARTSIG:4 Capsule(s) By Mouth Every 12 Hours     levothyroxine (SYNTHROID) 88 MCG tablet Take 88 mcg by mouth daily.     levothyroxine (SYNTHROID, LEVOTHROID) 100 MCG tablet Take 100 mcg by mouth See admin instructions. Take 1 tablet (100 mcg) by mouth daily in the morning, except on Sundays     meclizine (ANTIVERT) 25 MG tablet Take 25 mg by mouth 3 (three) times daily as needed.     metoprolol (LOPRESSOR) 50 MG tablet Take 50 mg by mouth 2 (two) times daily.      Multiple Vitamin (MULTIVITAMIN) tablet Take 1 tablet by mouth daily.     Multiple Vitamins-Minerals (CENTRUM SILVER) tablet Take 1 tablet by mouth daily.     nitroGLYCERIN (NITROSTAT) 0.4 MG SL tablet Place 1 tablet (0.4 mg total) under the tongue every 5 (five) minutes as needed for chest pain. 15 tablet 3   ONE TOUCH ULTRA TEST test strip      pantoprazole (PROTONIX) 40 MG tablet Take 40 mg by mouth at bedtime.      pantoprazole (PROTONIX) 40 MG tablet Take by mouth.     predniSONE (DELTASONE) 10 MG tablet      ranolazine (RANEXA) 1000 MG SR  tablet Take 1 tablet (1,000 mg total) by mouth 2 (two) times daily. 180 tablet 3   rosuvastatin (CRESTOR) 40 MG tablet Take 80 mg by mouth daily.     ticagrelor (BRILINTA) 90 MG TABS tablet TAKE 1 TABLET(90 MG) BY MOUTH TWICE DAILY 60 tablet 3   furosemide (LASIX) 40 MG tablet Take 1 tablet (40 mg total) by mouth daily as needed for fluid or edema. (Patient taking differently: Take 80 mg by mouth daily as needed for fluid or edema. Take) 30 tablet 3   No current facility-administered medications for this visit.    PHYSICAL EXAMINATION: ECOG PERFORMANCE STATUS: 1 - Symptomatic but completely ambulatory  Vitals:   08/23/22 1112  BP: (!) 109/50  Pulse: 67  Resp: 18  Temp: (!) 97.2 F (36.2 C)  SpO2: 98%   Filed Weights   08/23/22 1112  Weight: 172 lb 4.8 oz (78.2 kg)       LABORATORY DATA:  I have reviewed the data as listed    Latest Ref Rng & Units 08/23/2022   10:35 AM 01/28/2022    3:00 PM 01/01/2022    2:03 PM  CMP  Glucose 70 - 99 mg/dL 202  77  111   BUN 8 - 23 mg/dL 47  26  27   Creatinine 0.61 - 1.24 mg/dL 2.97  1.84  2.23   Sodium 135 - 145 mmol/L 133  141  140   Potassium 3.5 - 5.1 mmol/L 3.3  4.4  5.0   Chloride 98 - 111 mmol/L 92  105  102   CO2 22 - 32 mmol/L 32  23  24   Calcium 8.9 - 10.3 mg/dL 9.3  8.9  9.8   Total Protein 6.5 - 8.1 g/dL 7.4     Total Bilirubin 0.3 - 1.2 mg/dL 0.7     Alkaline Phos 38 - 126 U/L 41     AST 15 - 41 U/L 22     ALT 0 - 44 U/L 22       Lab Results  Component Value Date   WBC 5.9 08/23/2022   HGB 14.3 08/23/2022   HCT 41.7 08/23/2022   MCV 92.7 08/23/2022   PLT 199 08/23/2022   NEUTROABS 4.3 08/23/2022    ASSESSMENT & PLAN:  Polycythemia vera ([place]) Lab review: 08/22/2021: WBC 7.8, hemoglobin 15.9, platelets 214 12/28/2021: WBC 6.2, hemoglobin 18.4, hematocrit 55.8, platelets 219  Erythropoietin levels: 16.9: Normal 08/23/2022: Hemoglobin 14.3   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 Posey Pronto.  He tells me that he takes 80 mg of Lasix daily.  This is also getting difficult for him because he feels that he is mostly dehydrated.  He will discuss this with Dr. Posey Pronto.  Return to clinic in 1 year for follow-up with labs.   Orders Placed This Encounter  Procedures   CBC with Differential ([place] Only)    Standing Status:   Future    Standing Expiration Date:   08/24/2023   CMP ([place] only)    Standing Status:   Future    Standing Expiration Date:   08/24/2023   Ferritin    Standing Status:   Future    Standing Expiration Date:   08/23/2023   The patient has a good understanding of the overall plan. he agrees with it. he will  call with any  problems that may develop before the next visit here. Total time spent: 20 mins including face to face time and time spent for planning, charting and co-ordination of care   Harriette Ohara, MD 08/23/22    I Gardiner Coins am scribing for Dr. Lindi Adie  I have reviewed the above documentation for accuracy and completeness, and I agree with the above.

## 2022-08-23 ENCOUNTER — Other Ambulatory Visit: Payer: Self-pay

## 2022-08-23 ENCOUNTER — Other Ambulatory Visit: Payer: Self-pay | Admitting: Hematology and Oncology

## 2022-08-23 ENCOUNTER — Inpatient Hospital Stay: Payer: Medicare Other | Admitting: Hematology and Oncology

## 2022-08-23 ENCOUNTER — Inpatient Hospital Stay: Payer: Medicare Other | Attending: Hematology and Oncology

## 2022-08-23 DIAGNOSIS — N189 Chronic kidney disease, unspecified: Secondary | ICD-10-CM | POA: Insufficient documentation

## 2022-08-23 DIAGNOSIS — D45 Polycythemia vera: Secondary | ICD-10-CM | POA: Insufficient documentation

## 2022-08-23 DIAGNOSIS — Z79899 Other long term (current) drug therapy: Secondary | ICD-10-CM | POA: Diagnosis not present

## 2022-08-23 DIAGNOSIS — Z79890 Hormone replacement therapy (postmenopausal): Secondary | ICD-10-CM | POA: Diagnosis not present

## 2022-08-23 LAB — CMP (CANCER CENTER ONLY)
ALT: 22 U/L (ref 0–44)
AST: 22 U/L (ref 15–41)
Albumin: 4.3 g/dL (ref 3.5–5.0)
Alkaline Phosphatase: 41 U/L (ref 38–126)
Anion gap: 9 (ref 5–15)
BUN: 47 mg/dL — ABNORMAL HIGH (ref 8–23)
CO2: 32 mmol/L (ref 22–32)
Calcium: 9.3 mg/dL (ref 8.9–10.3)
Chloride: 92 mmol/L — ABNORMAL LOW (ref 98–111)
Creatinine: 2.97 mg/dL — ABNORMAL HIGH (ref 0.61–1.24)
GFR, Estimated: 21 mL/min — ABNORMAL LOW (ref >60)
Glucose, Bld: 202 mg/dL — ABNORMAL HIGH (ref 70–99)
Potassium: 3.3 mmol/L — ABNORMAL LOW (ref 3.5–5.1)
Sodium: 133 mmol/L — ABNORMAL LOW (ref 135–145)
Total Bilirubin: 0.7 mg/dL (ref 0.3–1.2)
Total Protein: 7.4 g/dL (ref 6.5–8.1)

## 2022-08-23 LAB — IRON AND IRON BINDING CAPACITY (CC-WL,HP ONLY)
Iron: 75 ug/dL (ref 45–182)
Saturation Ratios: 25 % (ref 17.9–39.5)
TIBC: 301 ug/dL (ref 250–450)
UIBC: 226 ug/dL (ref 117–376)

## 2022-08-23 LAB — CBC WITH DIFFERENTIAL (CANCER CENTER ONLY)
Abs Immature Granulocytes: 0.03 10*3/uL (ref 0.00–0.07)
Basophils Absolute: 0 10*3/uL (ref 0.0–0.1)
Basophils Relative: 1 %
Eosinophils Absolute: 0.3 10*3/uL (ref 0.0–0.5)
Eosinophils Relative: 6 %
HCT: 41.7 % (ref 39.0–52.0)
Hemoglobin: 14.3 g/dL (ref 13.0–17.0)
Immature Granulocytes: 1 %
Lymphocytes Relative: 10 %
Lymphs Abs: 0.6 10*3/uL — ABNORMAL LOW (ref 0.7–4.0)
MCH: 31.8 pg (ref 26.0–34.0)
MCHC: 34.3 g/dL (ref 30.0–36.0)
MCV: 92.7 fL (ref 80.0–100.0)
Monocytes Absolute: 0.7 10*3/uL (ref 0.1–1.0)
Monocytes Relative: 11 %
Neutro Abs: 4.3 10*3/uL (ref 1.7–7.7)
Neutrophils Relative %: 71 %
Platelet Count: 199 10*3/uL (ref 150–400)
RBC: 4.5 MIL/uL (ref 4.22–5.81)
RDW: 13.1 % (ref 11.5–15.5)
WBC Count: 5.9 10*3/uL (ref 4.0–10.5)
nRBC: 0 % (ref 0.0–0.2)

## 2022-08-23 LAB — FERRITIN: Ferritin: 76 ng/mL (ref 24–336)

## 2022-08-23 NOTE — Assessment & Plan Note (Signed)
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

## 2022-08-26 ENCOUNTER — Ambulatory Visit: Payer: Medicare Other | Admitting: Cardiology

## 2022-08-26 ENCOUNTER — Encounter: Payer: Self-pay | Admitting: Cardiology

## 2022-08-26 VITALS — BP 119/61 | HR 76 | Temp 97.7°F | Resp 16 | Ht 74.0 in | Wt 173.0 lb

## 2022-08-26 DIAGNOSIS — I447 Left bundle-branch block, unspecified: Secondary | ICD-10-CM

## 2022-08-26 DIAGNOSIS — J432 Centrilobular emphysema: Secondary | ICD-10-CM | POA: Diagnosis not present

## 2022-08-26 DIAGNOSIS — I10 Essential (primary) hypertension: Secondary | ICD-10-CM | POA: Diagnosis not present

## 2022-08-26 DIAGNOSIS — I255 Ischemic cardiomyopathy: Secondary | ICD-10-CM | POA: Diagnosis not present

## 2022-08-26 DIAGNOSIS — I5042 Chronic combined systolic (congestive) and diastolic (congestive) heart failure: Secondary | ICD-10-CM | POA: Diagnosis not present

## 2022-08-26 DIAGNOSIS — N179 Acute kidney failure, unspecified: Secondary | ICD-10-CM | POA: Diagnosis not present

## 2022-08-26 DIAGNOSIS — I25810 Atherosclerosis of coronary artery bypass graft(s) without angina pectoris: Secondary | ICD-10-CM

## 2022-08-26 DIAGNOSIS — Z955 Presence of coronary angioplasty implant and graft: Secondary | ICD-10-CM

## 2022-08-26 DIAGNOSIS — N1832 Chronic kidney disease, stage 3b: Secondary | ICD-10-CM | POA: Diagnosis not present

## 2022-08-26 DIAGNOSIS — I5022 Chronic systolic (congestive) heart failure: Secondary | ICD-10-CM

## 2022-08-26 DIAGNOSIS — I25708 Atherosclerosis of coronary artery bypass graft(s), unspecified, with other forms of angina pectoris: Secondary | ICD-10-CM

## 2022-08-26 DIAGNOSIS — E782 Mixed hyperlipidemia: Secondary | ICD-10-CM

## 2022-08-26 DIAGNOSIS — I130 Hypertensive heart and chronic kidney disease with heart failure and stage 1 through stage 4 chronic kidney disease, or unspecified chronic kidney disease: Secondary | ICD-10-CM | POA: Diagnosis not present

## 2022-08-26 DIAGNOSIS — E1121 Type 2 diabetes mellitus with diabetic nephropathy: Secondary | ICD-10-CM | POA: Diagnosis not present

## 2022-08-26 DIAGNOSIS — Z23 Encounter for immunization: Secondary | ICD-10-CM | POA: Diagnosis not present

## 2022-08-26 NOTE — Progress Notes (Signed)
ID:  Jesus Young, DOB Apr 01, 1946, MRN [id number]  PCP:  Merrilee Seashore, MD  Cardiologist:  Rex Kras, DO, [facility] (established care 08/26/2022) Former Cardiology Providers: Lawerance Cruel, PA Nephrologist: Dr. Elmarie Shiley  Chief Complaint  Patient presents with   Coronary Artery Disease   Heart failure management    Follow-up    HPI  Jesus Young is a 76 y.o. Caucasian male whose past medical history and cardiovascular risk factors include: CAD status post CABG in 2012, LBBB, HTN, IDDM, hyperlipidemia, chronic kidney disease, polycythemia, history of COVID-19 infection, advanced age.  Known history of CAD with status post CABG in 2012.  Presented to the ED in October 2022 with unstable angina and underwent PCI to the SVG to RCA graft.  Since then his medications have been uptitrated but limited due to underlying renal function.  At baseline patient does have residual angina for which he takes sublingual nitroglycerin tablets on as-needed basis.  However, the intensity, frequency, and/or duration has not been progressive.  His overall functional capacity remains relatively stable.  Some of his chest pain episodes may be psychosomatic per patient.  He recently had labs with oncology earlier this week which notes worsening renal function.  I do not have any recent labs available for comparison.  When asked what his dose of Lasix is patient states that nephrology has requested that he take 80 mg p.o. twice daily.  Clinically patient is euvolemic and not in overt heart failure.  ALLERGIES: Allergies  Allergen Reactions   Clonazepam Other (See Comments)    Headache    Lisinopril Other (See Comments)    headache    MEDICATION LIST PRIOR TO VISIT: Current Meds  Medication Sig   acetaminophen (TYLENOL) 650 MG CR tablet Take 1,300 mg by mouth in the morning and at bedtime.   aspirin EC 81 MG tablet Take 81 mg by mouth at bedtime.    B-D ULTRAFINE III SHORT PEN 31G X 8  MM MISC USE UTD   busPIRone (BUSPAR) 10 MG tablet Take 20 mg by mouth 2 (two) times daily.   Calcium Citrate-Vitamin D 315-5 MG-MCG TABS Take 1 tablet by mouth daily at 12 noon.   Docusate Sodium (GENTLE STOOL SOFTENER PO) Take 600 mg by mouth daily.   insulin detemir (LEVEMIR) 100 UNIT/ML injection Inject 0.16 mLs (16 Units total) into the skin 2 (two) times daily. (Patient taking differently: Inject 20-25 Units into the skin See admin instructions. Inject 20 units into the skin in the morning and 25 units at night)   insulin lispro (HUMALOG) 100 UNIT/ML injection Inject 0.05-0.09 mLs (5-9 Units total) into the skin 2 (two) times daily. 5 units at lunch, and 9 units after dinner. (Patient taking differently: Inject 5-15 Units into the skin 2 (two) times daily. 5-15 units as needed  (sliding scale insulin))   levothyroxine (SYNTHROID) 88 MCG tablet Take 88 mcg by mouth daily.   meclizine (ANTIVERT) 25 MG tablet Take 25 mg by mouth 3 (three) times daily as needed (for vertigo).   Multiple Vitamins-Minerals (CENTRUM SILVER) tablet Take 1 tablet by mouth daily.   nitroGLYCERIN (NITROSTAT) 0.4 MG SL tablet Place 1 tablet (0.4 mg total) under the tongue every 5 (five) minutes as needed for chest pain.   ONE TOUCH ULTRA TEST test strip    pantoprazole (PROTONIX) 40 MG tablet Take 40 mg by mouth at bedtime.    rosuvastatin (CRESTOR) 40 MG tablet Take 40 mg by mouth at bedtime.  ticagrelor (BRILINTA) 90 MG TABS tablet TAKE 1 TABLET(90 MG) BY MOUTH TWICE DAILY (Patient taking differently: Take 90 mg by mouth 2 (two) times daily.)   [DISCONTINUED] famotidine (PEPCID) 40 MG tablet Take 40 mg by mouth at bedtime.   [DISCONTINUED] furosemide (LASIX) 40 MG tablet Take 1 tablet (40 mg total) by mouth daily as needed for fluid or edema. (Patient not taking: Reported on 08/27/2022)   [DISCONTINUED] isosorbide mononitrate (IMDUR) 60 MG 24 hr tablet Take 1 tablet (60 mg total) by mouth daily.   [DISCONTINUED]  metoprolol (LOPRESSOR) 50 MG tablet Take 50 mg by mouth 2 (two) times daily.    [DISCONTINUED] predniSONE (DELTASONE) 10 MG tablet Take 10 mg by mouth daily as needed (for inflammation).   [DISCONTINUED] ranolazine (RANEXA) 1000 MG SR tablet Take 1 tablet (1,000 mg total) by mouth 2 (two) times daily.     PAST MEDICAL HISTORY: Past Medical History:  Diagnosis Date   Anxiety    Arthritis    back and knees   Bronchitis    hx of 2015   Cancer ([place])    prostate   Cataract    immature on right   Chronic back pain    CKD (chronic kidney disease)    Constipation    takes Colace at bedtime   Coronary artery disease    takes Plavix daily but is on hold for surgery   Depression    Diabetes mellitus without complication (HCC)    takes Levemir and Humalog daily   Dizziness    Fall    hx of with injury to left shoulder    GERD (gastroesophageal reflux disease)    takes Protonix daily and Zantac   H/O urinary frequency    Hemorrhoids    History of bladder infections    History of kidney stones    Hyperlipidemia    takes Zocor every evening   Hypertension    takes Metoprolol daily   Hypothyroidism    takes Synthroid daily   Left bundle branch block    Myocardial infarction ([place])    many yrs before 2002   Night muscle spasms    takes Robaxin 4 x day    Nocturia     PAST SURGICAL HISTORY: Past Surgical History:  Procedure Laterality Date   CARDIAC CATHETERIZATION  05/17/02   COLONOSCOPY     CORONARY ANGIOPLASTY     x 1    CORONARY ARTERY BYPASS GRAFT  2002   x 5   eyelid surgery      INGUINAL HERNIA REPAIR Left 05/06/2020   Procedure: LEFT INGUINAL HERNIA REPAIR;  Surgeon: Donnie Mesa, MD;  Location: [place];  Service: General;  Laterality: Left;   INSERTION OF MESH Left 05/06/2020   Procedure: INSERTION OF MESH;  Surgeon: Donnie Mesa, MD;  Location: [place];  Service: General;  Laterality: Left;   LEFT HEART CATH AND CORS/GRAFTS ANGIOGRAPHY N/A 12/05/2018   Procedure:  LEFT HEART CATH AND CORS/GRAFTS ANGIOGRAPHY;  Surgeon: Adrian Prows, MD;  Location: [place] [facility];  Service: Cardiovascular;  Laterality: N/A;   LEFT HEART CATH AND CORS/GRAFTS ANGIOGRAPHY N/A 08/21/2021   Procedure: LEFT HEART CATH AND CORS/GRAFTS ANGIOGRAPHY;  Surgeon: Adrian Prows, MD;  Location: [place] [facility];  Service: Cardiovascular;  Laterality: N/A;   LUMBAR LAMINECTOMY/DECOMPRESSION MICRODISCECTOMY Bilateral 07/18/2013   Procedure: Bilateral Lumbar four-five Lumbar Laminotomy/Microdiskectomy;  Surgeon: Hosie Spangle, MD;  Location: [place] [facility];  Service: Neurosurgery;  Laterality: Bilateral;  Bilateral Lumbar four-five  Lumbar Laminotomy/Microdiskectomy   NEPHROLITHOTOMY Left 06/23/2015   Procedure: NEPHROLITHOTOMY PERCUTANEOUS;  Surgeon: [name], MD;  Location: [facility];  Service: Urology;  Laterality: Left;   PROSTATECTOMY  10/2009   skin spots removed from back -precancerous     TONSILLECTOMY      FAMILY HISTORY: The patient family history includes Heart attack in his father; Heart disease in his father; Thyroid disease in his sister.  SOCIAL HISTORY:  The patient  reports that he has never smoked. He has never used smokeless tobacco. He reports that he does not drink alcohol and does not use drugs.  REVIEW OF SYSTEMS: Review of Systems  Cardiovascular:  Negative for chest pain, claudication, dyspnea on exertion, irregular heartbeat, leg swelling, near-syncope, orthopnea, palpitations, paroxysmal nocturnal dyspnea and syncope.  Respiratory:  Negative for shortness of breath.   Hematologic/Lymphatic: Negative for bleeding problem.  Musculoskeletal:  Negative for muscle cramps and myalgias.  Neurological:  Negative for dizziness and light-headedness.   PHYSICAL EXAM:    08/29/2022    3:00 PM 08/29/2022    8:53 AM 08/29/2022    6:46 AM  Vitals with BMI  Weight 173 lbs 5 oz    BMI 94.85    Systolic  462 703  Diastolic  72 73  Pulse  84 82    Physical Exam   Constitutional: No distress.  Age appropriate, hemodynamically stable.   Neck: No JVD present.  Cardiovascular: Normal rate, regular rhythm, S1 normal, S2 normal, intact distal pulses and normal pulses. Exam reveals no gallop, no S3 and no S4.  No murmur heard. Pulses:      Dorsalis pedis pulses are 2+ on the right side and 2+ on the left side.       Posterior tibial pulses are 2+ on the right side and 2+ on the left side.  Pulmonary/Chest: Effort normal and breath sounds normal. No stridor. He has no wheezes. He has no rales.  Abdominal: Soft. Bowel sounds are normal. He exhibits no distension. There is no abdominal tenderness.  Musculoskeletal:        General: No edema.     Cervical back: Neck supple.  Neurological: He is alert and oriented to person, place, and time. He has intact cranial nerves (2-12).  Skin: Skin is warm and moist.   CARDIAC DATABASE: EKG: 08/26/2022: Sinus rhythm, 78 bpm, left axis, left bundle branch block, ST-T changes likely secondary to BBB but ischemia cannot be ruled out. No significant change compared to ECG dated 02/01/2022.   Echocardiogram: 11/26/2021: Mildly depressed LV systolic function with visual EF 40-45%. Left ventricle cavity is mildly dilated. Mild left ventricular hypertrophy. Doppler evidence of grade I (impaired) diastolic dysfunction, normal LAP. Left ventricle regional wall motion findings: Basal anteroseptal, Basal anterior, Basal inferoseptal, Mid anteroseptal, Mid anterior, Mid inferoseptal and Apical anterior hypokinesis. Mild (Grade I) mitral regurgitation. Mild pulmonic regurgitation. Compared to study 07/08/2021 LVEF improved from 30-35% to 40-45% with RWMA, Moderate MR is now mild, otherwise no significant change.  Stress Testing: Lexiscan Tetrofosmin stress test 11/24/2020: Lexiscan nuclear stress test performed using 1-day protocol. Rest and stress EKG showed sinus rhythm, IVCD/tachycardia. Normal myocardial perfusion. Stress LVEF  25%. High risk study due to low stress LVEF.  Heart Catheterization: 08/21/2021 LV: 124/7, EDP 13 mmHg.  Ao 122/70, mean 92 mmHg.  No pressure gradient across the aortic valve. LM: Diffusely diseased with occluded proximal LAD and proximal circumflex with minor branches evident. RCA: Occluded in the midsegment and severely diseased proximally. SVG  to RCA: Proximal to mid segment has a ulcerated 80 to 90% stenosis.  Native vessel itself in the PL branch has a diffuse 90% stenosis and PDA has 60 to 70% stenosis.  This is unchanged from prior cardiac catheterization. Successful direct stenting with use of a spider filter wire for distal protection and implantation of a 4.0 x 16 mm Synergy XD at 16 atmospheric pressure, 60 seconds, stenosis reduced to 0%.  TIMI-3 to TIMI-3 flow. SVG to D1: Occluded. Free radial graft to OM1: Widely patent. LIMA to LAD: Widely patent.  There is mild to moderate disease in the native LAD which appears to have improved from prior cardiac catheterization.  Recommendation: Patient will be placed on dual antiplatelet therapy.  He will need to be on diuretics in the morning, please start Carrington Clamp next week once renal function is stable.  He can potentially go home either tomorrow if renal function is stable and he is hemodynamically stable.  60 mL contrast utilized.  LABORATORY DATA:    Latest Ref Rng & Units 08/29/2022    2:29 AM 08/28/2022    6:42 AM 08/27/2022    8:19 AM  CBC  WBC 4.0 - 10.5 K/uL 5.8  5.6    Hemoglobin 13.0 - 17.0 g/dL 12.1  12.2  11.9   Hematocrit 39.0 - 52.0 % 35.4  36.1  35.0   Platelets 150 - 400 K/uL 167  170         Latest Ref Rng & Units 08/29/2022    2:29 AM 08/28/2022    6:42 AM 08/27/2022    3:09 AM  CMP  Glucose 70 - 99 mg/dL 150  104  327   BUN 8 - 23 mg/dL 28  34  44   Creatinine 0.61 - 1.24 mg/dL 2.33  2.51  3.09   Sodium 135 - 145 mmol/L 135  134  133   Potassium 3.5 - 5.1 mmol/L 3.5  3.4  2.9   Chloride 98 - 111 mmol/L  106  103  93   CO2 22 - 32 mmol/L '22  25  26   '$ Calcium 8.9 - 10.3 mg/dL 8.7  8.5  8.7     Lipid Panel     Component Value Date/Time   CHOL 109 08/29/2022 0229   TRIG 214 (H) 08/29/2022 0229   HDL 29 (L) 08/29/2022 0229   CHOLHDL 3.8 08/29/2022 0229   VLDL 43 (H) 08/29/2022 0229   LDLCALC 37 08/29/2022 0229   LDLDIRECT 51 08/29/2022 0229    No components found for: "NTPROBNP" No results for input(s): "PROBNP" in the last 8760 hours. Recent Labs    08/27/22 0819  TSH 2.142    BMP Recent Labs    08/27/22 0309 08/28/22 0642 08/29/22 0229  NA 133* 134* 135  K 2.9* 3.4* 3.5  CL 93* 103 106  CO2 '26 25 22  '$ GLUCOSE 327* 104* 150*  BUN 44* 34* 28*  CREATININE 3.09* 2.51* 2.33*  CALCIUM 8.7* 8.5* 8.7*  GFRNONAA 20* 26* 28*    HEMOGLOBIN A1C Lab Results  Component Value Date   HGBA1C 7.7 (H) 08/27/2022   MPG 174.29 08/27/2022   External Labs: Collected: 08/26/2022 provided by the patient performed at [facility] AST 24, ALT 33, alkaline phosphatase 46. BUN 40, creatinine 2.79. BUN/creatinine ratio 14.3. Chloride 95  IMPRESSION:    ICD-10-CM   1. Chronic HFrEF (heart failure with reduced ejection fraction) (HCC)  I50.22 EKG 12-Lead    2. AKI (acute kidney injury) (  [place])  N17.9     3. Ischemic cardiomyopathy  I25.5     4. Coronary artery disease of bypass graft of native heart with stable angina pectoris (HCC)  I25.708     5. Status post insertion of drug eluting coronary artery stent  Z95.5     6. Mixed hyperlipidemia  E78.2     7. LBBB (left bundle branch block)  I44.7     8. Benign hypertension  I10        RECOMMENDATIONS: Jesus Young is a 76 y.o. Caucasian male whose past medical history and cardiac risk factors include: CAD status post CABG in 2012, LBBB, HTN, IDDM, hyperlipidemia, chronic kidney disease, polycythemia, history of COVID-19 infection, advanced age.  Chronic HFrEF (heart failure with reduced ejection fraction) (HCC) Stage C, NYHA  class II/III. Clinically euvolemic. Currently on beta-blockers, Lasix. Not on ACE inhibitor/ARB/ARNI/MRA/SGLT2 inhibitors likely due to underlying renal function. Patient is transitioning care to my practice.  We will follow his creatinine and if room we will uptitrate GDMT.  AKI (acute kidney injury) ([place]) While the patient was in the office I was concerned that he had developed acute kidney injury as his labs from 08/23/2022 noted a serum creatinine of 2.9.  At the time of the office visit I requested him to hold off taking Lasix 80 mg p.o. twice daily until he talks to his nephrologist to have a better understanding of his baseline renal function. The labs noted above from 08/26/2022 were obtained after the office visit.  Ischemic cardiomyopathy /history of CABG with stable angina pectoris /status post PCI SVG to RCA  Currently not on appropriate GDMT due to his underlying renal function. Antianginal therapy includes metoprolol, Ranexa, Imdur. EKG shows sinus mechanism with underlying left bundle with ST-T changes cannot entirely rule out ischemia. Most recent echocardiogram reviewed. We will uptitrate GDMT once renal function improves.  Mixed hyperlipidemia Currently on rosuvastatin.   He denies myalgia or other side effects.  Benign hypertension Office blood pressures are very well controlled. We will hold off Lasix as discussed above. I have asked him to keep a log of his blood pressures for now. May be a good candidate for remote patient monitoring given his cardiomyopathy/stable angina/HFrEF.  FINAL MEDICATION LIST END OF ENCOUNTER: No orders of the defined types were placed in this encounter.   Medications Discontinued During This Encounter  Medication Reason   LAGEVRIO 200 MG CAPS capsule    levothyroxine (SYNTHROID, LEVOTHROID) 100 MCG tablet Change in therapy   Multiple Vitamin (MULTIVITAMIN) tablet Change in therapy   pantoprazole (PROTONIX) 40 MG tablet Duplicate      Current Outpatient Medications:    acetaminophen (TYLENOL) 650 MG CR tablet, Take 1,300 mg by mouth in the morning and at bedtime., Disp: , Rfl:    aspirin EC 81 MG tablet, Take 81 mg by mouth at bedtime. , Disp: , Rfl:    B-D ULTRAFINE III SHORT PEN 31G X 8 MM MISC, USE UTD, Disp: , Rfl: 0   busPIRone (BUSPAR) 10 MG tablet, Take 20 mg by mouth 2 (two) times daily., Disp: , Rfl:    Calcium Citrate-Vitamin D 315-5 MG-MCG TABS, Take 1 tablet by mouth daily at 12 noon., Disp: , Rfl:    Docusate Sodium (GENTLE STOOL SOFTENER PO), Take 600 mg by mouth daily., Disp: , Rfl:    insulin detemir (LEVEMIR) 100 UNIT/ML injection, Inject 0.16 mLs (16 Units total) into the skin 2 (two) times daily. (Patient taking differently: Inject  20-25 Units into the skin See admin instructions. Inject 20 units into the skin in the morning and 25 units at night), Disp: 10 mL, Rfl: 1   insulin lispro (HUMALOG) 100 UNIT/ML injection, Inject 0.05-0.09 mLs (5-9 Units total) into the skin 2 (two) times daily. 5 units at lunch, and 9 units after dinner. (Patient taking differently: Inject 5-15 Units into the skin 2 (two) times daily. 5-15 units as needed  (sliding scale insulin)), Disp: 10 mL, Rfl: 1   levothyroxine (SYNTHROID) 88 MCG tablet, Take 88 mcg by mouth daily., Disp: , Rfl:    meclizine (ANTIVERT) 25 MG tablet, Take 25 mg by mouth 3 (three) times daily as needed (for vertigo)., Disp: , Rfl:    Multiple Vitamins-Minerals (CENTRUM SILVER) tablet, Take 1 tablet by mouth daily., Disp: , Rfl:    nitroGLYCERIN (NITROSTAT) 0.4 MG SL tablet, Place 1 tablet (0.4 mg total) under the tongue every 5 (five) minutes as needed for chest pain., Disp: 15 tablet, Rfl: 3   ONE TOUCH ULTRA TEST test strip, , Disp: , Rfl:    pantoprazole (PROTONIX) 40 MG tablet, Take 40 mg by mouth at bedtime. , Disp: , Rfl:    rosuvastatin (CRESTOR) 40 MG tablet, Take 40 mg by mouth at bedtime., Disp: , Rfl:    ticagrelor (BRILINTA) 90 MG TABS tablet,  TAKE 1 TABLET(90 MG) BY MOUTH TWICE DAILY (Patient taking differently: Take 90 mg by mouth 2 (two) times daily.), Disp: 60 tablet, Rfl: 3   famotidine (PEPCID) 20 MG tablet, Take 1 tablet (20 mg total) by mouth at bedtime., Disp: 30 tablet, Rfl: 0   furosemide (LASIX) 40 MG tablet, Take 1 tablet (40 mg total) by mouth daily., Disp: 30 tablet, Rfl: 3   isosorbide mononitrate (IMDUR) 60 MG 24 hr tablet, Take 1 tablet (60 mg total) by mouth daily., Disp: 30 tablet, Rfl: 3   metoprolol succinate (TOPROL XL) 50 MG 24 hr tablet, Take 1 tablet (50 mg total) by mouth daily. Take with or immediately following a meal., Disp: 30 tablet, Rfl: 0   ranolazine (RANEXA) 500 MG 12 hr tablet, Take 1 tablet (500 mg total) by mouth 2 (two) times daily., Disp: 60 tablet, Rfl: 0  Orders Placed This Encounter  Procedures   EKG 12-Lead    There are no Patient Instructions on file for this visit.   --Continue cardiac medications as reconciled in final medication list. --Return in about 6 months (around 02/25/2023) for Follow up, CAD, heart failure management.. or sooner if needed. --Continue follow-up with your primary care physician regarding the management of your other chronic comorbid conditions.  Patient's questions and concerns were addressed to his satisfaction. He voices understanding of the instructions provided during this encounter.   This note was created using a voice recognition software as a result there may be grammatical errors inadvertently enclosed that do not reflect the nature of this encounter. Every attempt is made to correct such errors.  Rex Kras, [place], [facility]  Pager: [phone] Office: [phone]

## 2022-08-27 ENCOUNTER — Inpatient Hospital Stay (HOSPITAL_COMMUNITY)
Admission: EM | Admit: 2022-08-27 | Discharge: 2022-08-29 | DRG: 281 | Disposition: A | Discharge status: Home / Self Care | Payer: Medicare Other | Attending: Family Medicine | Admitting: Family Medicine

## 2022-08-27 ENCOUNTER — Observation Stay (HOSPITAL_COMMUNITY): Payer: Medicare Other

## 2022-08-27 ENCOUNTER — Encounter (HOSPITAL_COMMUNITY): Payer: Self-pay | Admitting: Internal Medicine

## 2022-08-27 ENCOUNTER — Emergency Department (HOSPITAL_COMMUNITY): Payer: Medicare Other

## 2022-08-27 ENCOUNTER — Other Ambulatory Visit: Payer: Self-pay

## 2022-08-27 DIAGNOSIS — R7989 Other specified abnormal findings of blood chemistry: Secondary | ICD-10-CM | POA: Diagnosis present

## 2022-08-27 DIAGNOSIS — E119 Type 2 diabetes mellitus without complications: Secondary | ICD-10-CM

## 2022-08-27 DIAGNOSIS — I3139 Other pericardial effusion (noninflammatory): Secondary | ICD-10-CM | POA: Diagnosis not present

## 2022-08-27 DIAGNOSIS — R9431 Abnormal electrocardiogram [ECG] [EKG]: Secondary | ICD-10-CM | POA: Diagnosis present

## 2022-08-27 DIAGNOSIS — F419 Anxiety disorder, unspecified: Secondary | ICD-10-CM | POA: Diagnosis present

## 2022-08-27 DIAGNOSIS — E1165 Type 2 diabetes mellitus with hyperglycemia: Secondary | ICD-10-CM | POA: Diagnosis present

## 2022-08-27 DIAGNOSIS — I447 Left bundle-branch block, unspecified: Secondary | ICD-10-CM | POA: Diagnosis present

## 2022-08-27 DIAGNOSIS — I214 Non-ST elevation (NSTEMI) myocardial infarction: Secondary | ICD-10-CM | POA: Diagnosis not present

## 2022-08-27 DIAGNOSIS — N1832 Chronic kidney disease, stage 3b: Secondary | ICD-10-CM | POA: Diagnosis not present

## 2022-08-27 DIAGNOSIS — N179 Acute kidney failure, unspecified: Secondary | ICD-10-CM | POA: Diagnosis present

## 2022-08-27 DIAGNOSIS — N281 Cyst of kidney, acquired: Secondary | ICD-10-CM | POA: Diagnosis not present

## 2022-08-27 DIAGNOSIS — Z8249 Family history of ischemic heart disease and other diseases of the circulatory system: Secondary | ICD-10-CM

## 2022-08-27 DIAGNOSIS — N189 Chronic kidney disease, unspecified: Secondary | ICD-10-CM | POA: Diagnosis not present

## 2022-08-27 DIAGNOSIS — I130 Hypertensive heart and chronic kidney disease with heart failure and stage 1 through stage 4 chronic kidney disease, or unspecified chronic kidney disease: Secondary | ICD-10-CM | POA: Diagnosis not present

## 2022-08-27 DIAGNOSIS — D631 Anemia in chronic kidney disease: Secondary | ICD-10-CM | POA: Diagnosis not present

## 2022-08-27 DIAGNOSIS — D751 Secondary polycythemia: Secondary | ICD-10-CM | POA: Diagnosis present

## 2022-08-27 DIAGNOSIS — K219 Gastro-esophageal reflux disease without esophagitis: Secondary | ICD-10-CM | POA: Diagnosis not present

## 2022-08-27 DIAGNOSIS — E039 Hypothyroidism, unspecified: Secondary | ICD-10-CM | POA: Diagnosis present

## 2022-08-27 DIAGNOSIS — E1122 Type 2 diabetes mellitus with diabetic chronic kidney disease: Secondary | ICD-10-CM | POA: Diagnosis present

## 2022-08-27 DIAGNOSIS — I252 Old myocardial infarction: Secondary | ICD-10-CM

## 2022-08-27 DIAGNOSIS — I2510 Atherosclerotic heart disease of native coronary artery without angina pectoris: Secondary | ICD-10-CM | POA: Diagnosis present

## 2022-08-27 DIAGNOSIS — Z9079 Acquired absence of other genital organ(s): Secondary | ICD-10-CM

## 2022-08-27 DIAGNOSIS — D45 Polycythemia vera: Secondary | ICD-10-CM | POA: Diagnosis present

## 2022-08-27 DIAGNOSIS — E7800 Pure hypercholesterolemia, unspecified: Secondary | ICD-10-CM | POA: Diagnosis not present

## 2022-08-27 DIAGNOSIS — I10 Essential (primary) hypertension: Secondary | ICD-10-CM | POA: Diagnosis present

## 2022-08-27 DIAGNOSIS — Z8349 Family history of other endocrine, nutritional and metabolic diseases: Secondary | ICD-10-CM

## 2022-08-27 DIAGNOSIS — I5042 Chronic combined systolic (congestive) and diastolic (congestive) heart failure: Secondary | ICD-10-CM | POA: Diagnosis present

## 2022-08-27 DIAGNOSIS — Z794 Long term (current) use of insulin: Secondary | ICD-10-CM

## 2022-08-27 DIAGNOSIS — E871 Hypo-osmolality and hyponatremia: Secondary | ICD-10-CM | POA: Diagnosis present

## 2022-08-27 DIAGNOSIS — Z8546 Personal history of malignant neoplasm of prostate: Secondary | ICD-10-CM

## 2022-08-27 DIAGNOSIS — Z951 Presence of aortocoronary bypass graft: Secondary | ICD-10-CM

## 2022-08-27 DIAGNOSIS — N184 Chronic kidney disease, stage 4 (severe): Secondary | ICD-10-CM | POA: Diagnosis not present

## 2022-08-27 DIAGNOSIS — I499 Cardiac arrhythmia, unspecified: Secondary | ICD-10-CM | POA: Diagnosis not present

## 2022-08-27 DIAGNOSIS — E876 Hypokalemia: Secondary | ICD-10-CM | POA: Diagnosis not present

## 2022-08-27 DIAGNOSIS — I209 Angina pectoris, unspecified: Secondary | ICD-10-CM

## 2022-08-27 DIAGNOSIS — Z79890 Hormone replacement therapy (postmenopausal): Secondary | ICD-10-CM

## 2022-08-27 DIAGNOSIS — R079 Chest pain, unspecified: Secondary | ICD-10-CM | POA: Diagnosis not present

## 2022-08-27 DIAGNOSIS — Z955 Presence of coronary angioplasty implant and graft: Secondary | ICD-10-CM

## 2022-08-27 DIAGNOSIS — I340 Nonrheumatic mitral (valve) insufficiency: Secondary | ICD-10-CM | POA: Diagnosis not present

## 2022-08-27 DIAGNOSIS — I25118 Atherosclerotic heart disease of native coronary artery with other forms of angina pectoris: Secondary | ICD-10-CM | POA: Diagnosis present

## 2022-08-27 DIAGNOSIS — Z743 Need for continuous supervision: Secondary | ICD-10-CM | POA: Diagnosis not present

## 2022-08-27 DIAGNOSIS — Z8616 Personal history of COVID-19: Secondary | ICD-10-CM | POA: Diagnosis not present

## 2022-08-27 DIAGNOSIS — Z79899 Other long term (current) drug therapy: Secondary | ICD-10-CM

## 2022-08-27 DIAGNOSIS — Z7902 Long term (current) use of antithrombotics/antiplatelets: Secondary | ICD-10-CM | POA: Diagnosis not present

## 2022-08-27 DIAGNOSIS — R0789 Other chest pain: Secondary | ICD-10-CM | POA: Diagnosis not present

## 2022-08-27 DIAGNOSIS — Z7982 Long term (current) use of aspirin: Secondary | ICD-10-CM

## 2022-08-27 LAB — BASIC METABOLIC PANEL
Anion gap: 14 (ref 5–15)
BUN: 44 mg/dL — ABNORMAL HIGH (ref 8–23)
CO2: 26 mmol/L (ref 22–32)
Calcium: 8.7 mg/dL — ABNORMAL LOW (ref 8.9–10.3)
Chloride: 93 mmol/L — ABNORMAL LOW (ref 98–111)
Creatinine, Ser: 3.09 mg/dL — ABNORMAL HIGH (ref 0.61–1.24)
GFR, Estimated: 20 mL/min — ABNORMAL LOW (ref >60)
Glucose, Bld: 327 mg/dL — ABNORMAL HIGH (ref 70–99)
Potassium: 2.9 mmol/L — ABNORMAL LOW (ref 3.5–5.1)
Sodium: 133 mmol/L — ABNORMAL LOW (ref 135–145)

## 2022-08-27 LAB — CBG MONITORING, ED
Glucose-Capillary: 200 mg/dL — ABNORMAL HIGH (ref 70–99)
Glucose-Capillary: 73 mg/dL (ref 70–99)

## 2022-08-27 LAB — HEMOGLOBIN A1C
Hgb A1c MFr Bld: 7.7 % — ABNORMAL HIGH (ref 4.8–5.6)
Mean Plasma Glucose: 174.29 mg/dL

## 2022-08-27 LAB — TROPONIN I (HIGH SENSITIVITY)
Troponin I (High Sensitivity): 26 ng/L — ABNORMAL HIGH (ref <18)
Troponin I (High Sensitivity): 59 ng/L — ABNORMAL HIGH (ref <18)
Troponin I (High Sensitivity): 107 ng/L (ref <18)

## 2022-08-27 LAB — GLUCOSE, CAPILLARY: Glucose-Capillary: 237 mg/dL — ABNORMAL HIGH (ref 70–99)

## 2022-08-27 LAB — MAGNESIUM: Magnesium: 1.9 mg/dL (ref 1.7–2.4)

## 2022-08-27 LAB — CBC
HCT: 36.3 % — ABNORMAL LOW (ref 39.0–52.0)
Hemoglobin: 12.4 g/dL — ABNORMAL LOW (ref 13.0–17.0)
MCH: 31.9 pg (ref 26.0–34.0)
MCHC: 34.2 g/dL (ref 30.0–36.0)
MCV: 93.3 fL (ref 80.0–100.0)
Platelets: 167 10*3/uL (ref 150–400)
RBC: 3.89 MIL/uL — ABNORMAL LOW (ref 4.22–5.81)
RDW: 13.3 % (ref 11.5–15.5)
WBC: 5.4 10*3/uL (ref 4.0–10.5)
nRBC: 0 % (ref 0.0–0.2)

## 2022-08-27 LAB — ECHOCARDIOGRAM COMPLETE
AR max vel: 2.93 cm2
AV Area VTI: 2.88 cm2
AV Area mean vel: 2.41 cm2
AV Mean grad: 2 mmHg
AV Peak grad: 2.9 mmHg
Ao pk vel: 0.85 m/s
Area-P 1/2: 5.23 cm2
Calc EF: 18.2 %
S' Lateral: 4.5 cm
Single Plane A2C EF: 23.5 %
Single Plane A4C EF: 17.7 %

## 2022-08-27 LAB — URINALYSIS, ROUTINE W REFLEX MICROSCOPIC
Bacteria, UA: NONE SEEN
Bilirubin Urine: NEGATIVE
Glucose, UA: 50 mg/dL — AB
Hgb urine dipstick: NEGATIVE
Ketones, ur: NEGATIVE mg/dL
Leukocytes,Ua: NEGATIVE
Nitrite: NEGATIVE
Protein, ur: 30 mg/dL — AB
Specific Gravity, Urine: 1.012 (ref 1.005–1.030)
pH: 5 (ref 5.0–8.0)

## 2022-08-27 LAB — HEPARIN LEVEL (UNFRACTIONATED): Heparin Unfractionated: 0.26 IU/mL — ABNORMAL LOW (ref 0.30–0.70)

## 2022-08-27 LAB — CREATININE, URINE, RANDOM: Creatinine, Urine: 58 mg/dL

## 2022-08-27 LAB — BRAIN NATRIURETIC PEPTIDE: B Natriuretic Peptide: 972.2 pg/mL — ABNORMAL HIGH (ref 0.0–100.0)

## 2022-08-27 LAB — SODIUM, URINE, RANDOM: Sodium, Ur: 38 mmol/L

## 2022-08-27 LAB — HEMOGLOBIN AND HEMATOCRIT, BLOOD
HCT: 35 % — ABNORMAL LOW (ref 39.0–52.0)
Hemoglobin: 11.9 g/dL — ABNORMAL LOW (ref 13.0–17.0)

## 2022-08-27 LAB — TSH: TSH: 2.142 u[IU]/mL (ref 0.350–4.500)

## 2022-08-27 MED ORDER — INSULIN DETEMIR 100 UNIT/ML ~~LOC~~ SOLN
20.0000 [IU] | Freq: Every evening | SUBCUTANEOUS | Status: DC
  Administered 2022-08-27 – 2022-08-28 (×2): 20 [IU] via SUBCUTANEOUS
  Filled 2022-08-27 (×5): qty 0.2

## 2022-08-27 MED ORDER — PERFLUTREN LIPID MICROSPHERE
1.0000 mL | INTRAVENOUS | Status: AC | PRN
  Administered 2022-08-27: 4 mL via INTRAVENOUS

## 2022-08-27 MED ORDER — LEVOTHYROXINE SODIUM 88 MCG PO TABS
88.0000 ug | ORAL_TABLET | Freq: Every day | ORAL | Status: DC
  Administered 2022-08-27 – 2022-08-29 (×3): 88 ug via ORAL
  Filled 2022-08-27 (×3): qty 1

## 2022-08-27 MED ORDER — FAMOTIDINE 20 MG PO TABS
40.0000 mg | ORAL_TABLET | Freq: Every day | ORAL | Status: DC
  Administered 2022-08-27: 40 mg via ORAL
  Filled 2022-08-27: qty 2

## 2022-08-27 MED ORDER — POTASSIUM CHLORIDE 10 MEQ/100ML IV SOLN
10.0000 meq | INTRAVENOUS | Status: AC
  Administered 2022-08-27 (×2): 10 meq via INTRAVENOUS
  Filled 2022-08-27 (×2): qty 100

## 2022-08-27 MED ORDER — RANOLAZINE ER 500 MG PO TB12
1000.0000 mg | ORAL_TABLET | Freq: Two times a day (BID) | ORAL | Status: DC
  Administered 2022-08-27 – 2022-08-28 (×3): 1000 mg via ORAL
  Filled 2022-08-27 (×3): qty 2

## 2022-08-27 MED ORDER — INSULIN DETEMIR 100 UNIT/ML ~~LOC~~ SOLN
25.0000 [IU] | Freq: Every morning | SUBCUTANEOUS | Status: DC
  Filled 2022-08-27 (×2): qty 0.25

## 2022-08-27 MED ORDER — METOPROLOL TARTRATE 50 MG PO TABS
50.0000 mg | ORAL_TABLET | Freq: Two times a day (BID) | ORAL | Status: DC
  Administered 2022-08-27 – 2022-08-29 (×5): 50 mg via ORAL
  Filled 2022-08-27 (×2): qty 1
  Filled 2022-08-27: qty 2
  Filled 2022-08-27 (×2): qty 1

## 2022-08-27 MED ORDER — INSULIN DETEMIR 100 UNIT/ML ~~LOC~~ SOLN: 20.0000 [IU] | SUBCUTANEOUS | Status: DC

## 2022-08-27 MED ORDER — MORPHINE SULFATE (PF) 4 MG/ML IV SOLN
2.0000 mg | Freq: Once | INTRAVENOUS | Status: AC
  Administered 2022-08-27: 2 mg via INTRAVENOUS
  Filled 2022-08-27: qty 1

## 2022-08-27 MED ORDER — ISOSORBIDE MONONITRATE ER 60 MG PO TB24
60.0000 mg | ORAL_TABLET | Freq: Every day | ORAL | Status: DC
  Administered 2022-08-27 – 2022-08-29 (×3): 60 mg via ORAL
  Filled 2022-08-27: qty 1
  Filled 2022-08-27: qty 2
  Filled 2022-08-27: qty 1

## 2022-08-27 MED ORDER — NITROGLYCERIN IN D5W 200-5 MCG/ML-% IV SOLN: 0.0000 ug/min | INTRAVENOUS | Status: DC

## 2022-08-27 MED ORDER — ROSUVASTATIN CALCIUM 20 MG PO TABS
40.0000 mg | ORAL_TABLET | Freq: Every day | ORAL | Status: DC
  Administered 2022-08-27 – 2022-08-28 (×2): 40 mg via ORAL
  Filled 2022-08-27 (×2): qty 2

## 2022-08-27 MED ORDER — ASPIRIN 81 MG PO TBEC
81.0000 mg | DELAYED_RELEASE_TABLET | Freq: Every day | ORAL | Status: DC
  Administered 2022-08-27 – 2022-08-28 (×2): 81 mg via ORAL
  Filled 2022-08-27 (×2): qty 1

## 2022-08-27 MED ORDER — ALBUTEROL SULFATE (2.5 MG/3ML) 0.083% IN NEBU: 2.5000 mg | INHALATION_SOLUTION | Freq: Four times a day (QID) | RESPIRATORY_TRACT | Status: DC | PRN

## 2022-08-27 MED ORDER — CALCIUM CITRATE-VITAMIN D 315-5 MG-MCG PO TABS: 1.0000 | ORAL_TABLET | Freq: Every day | ORAL | Status: DC

## 2022-08-27 MED ORDER — ACETAMINOPHEN 325 MG PO TABS
650.0000 mg | ORAL_TABLET | Freq: Three times a day (TID) | ORAL | Status: DC | PRN
  Filled 2022-08-27: qty 2

## 2022-08-27 MED ORDER — SODIUM CHLORIDE 0.9% FLUSH
3.0000 mL | Freq: Two times a day (BID) | INTRAVENOUS | Status: DC
  Administered 2022-08-27 – 2022-08-29 (×3): 3 mL via INTRAVENOUS

## 2022-08-27 MED ORDER — MAGNESIUM SULFATE 2 GM/50ML IV SOLN
2.0000 g | Freq: Once | INTRAVENOUS | Status: AC
  Administered 2022-08-27: 2 g via INTRAVENOUS
  Filled 2022-08-27: qty 50

## 2022-08-27 MED ORDER — SODIUM CHLORIDE 0.9 % IV SOLN: INTRAVENOUS | Status: DC

## 2022-08-27 MED ORDER — TRIMETHOBENZAMIDE HCL 100 MG/ML IM SOLN: 200.0000 mg | Freq: Four times a day (QID) | INTRAMUSCULAR | Status: DC | PRN

## 2022-08-27 MED ORDER — DOCUSATE SODIUM 100 MG PO CAPS
600.0000 mg | ORAL_CAPSULE | Freq: Every day | ORAL | Status: DC | PRN
  Administered 2022-08-27: 600 mg via ORAL
  Filled 2022-08-27: qty 6

## 2022-08-27 MED ORDER — TICAGRELOR 90 MG PO TABS
90.0000 mg | ORAL_TABLET | Freq: Two times a day (BID) | ORAL | Status: DC
  Administered 2022-08-27 – 2022-08-29 (×5): 90 mg via ORAL
  Filled 2022-08-27 (×6): qty 1

## 2022-08-27 MED ORDER — ACETAMINOPHEN 650 MG RE SUPP: 650.0000 mg | Freq: Three times a day (TID) | RECTAL | Status: DC | PRN

## 2022-08-27 MED ORDER — DOCUSATE SODIUM 100 MG PO CAPS
100.0000 mg | ORAL_CAPSULE | Freq: Every day | ORAL | Status: DC | PRN
  Administered 2022-08-27: 100 mg via ORAL
  Filled 2022-08-27: qty 1

## 2022-08-27 MED ORDER — BUSPIRONE HCL 5 MG PO TABS
20.0000 mg | ORAL_TABLET | Freq: Two times a day (BID) | ORAL | Status: DC
  Administered 2022-08-27 – 2022-08-29 (×5): 20 mg via ORAL
  Filled 2022-08-27: qty 2
  Filled 2022-08-27 (×4): qty 4

## 2022-08-27 MED ORDER — POTASSIUM CHLORIDE CRYS ER 20 MEQ PO TBCR
40.0000 meq | EXTENDED_RELEASE_TABLET | Freq: Once | ORAL | Status: AC
  Administered 2022-08-27: 40 meq via ORAL
  Filled 2022-08-27: qty 2

## 2022-08-27 MED ORDER — MECLIZINE HCL 25 MG PO TABS: 25.0000 mg | ORAL_TABLET | Freq: Three times a day (TID) | ORAL | Status: DC | PRN

## 2022-08-27 MED ORDER — ACETAMINOPHEN ER 650 MG PO TBCR: 1300.0000 mg | EXTENDED_RELEASE_TABLET | Freq: Two times a day (BID) | ORAL | Status: DC

## 2022-08-27 MED ORDER — HEPARIN BOLUS VIA INFUSION
4000.0000 [IU] | Freq: Once | INTRAVENOUS | Status: AC
  Administered 2022-08-27: 4000 [IU] via INTRAVENOUS
  Filled 2022-08-27: qty 4000

## 2022-08-27 MED ORDER — HEPARIN (PORCINE) 25000 UT/250ML-% IV SOLN
1200.0000 [IU]/h | INTRAVENOUS | Status: DC
  Administered 2022-08-27: 900 [IU]/h via INTRAVENOUS
  Administered 2022-08-28: 1050 [IU]/h via INTRAVENOUS
  Administered 2022-08-29: 1200 [IU]/h via INTRAVENOUS
  Filled 2022-08-27 (×3): qty 250

## 2022-08-27 MED ORDER — INSULIN ASPART 100 UNIT/ML IJ SOLN
0.0000 [IU] | Freq: Three times a day (TID) | INTRAMUSCULAR | Status: DC
  Administered 2022-08-27: 3 [IU] via SUBCUTANEOUS
  Administered 2022-08-27: 5 [IU] via SUBCUTANEOUS
  Administered 2022-08-28 (×2): 3 [IU] via SUBCUTANEOUS
  Administered 2022-08-29: 2 [IU] via SUBCUTANEOUS

## 2022-08-27 NOTE — ED Notes (Signed)
Report given and care endorsed to Awanda Mink, RN

## 2022-08-27 NOTE — Consult Note (Signed)
CARDIOLOGY CONSULT NOTE  [name] MRN: [id number] DOB/AGE: 1946-04-23 76 y.o.  Admit date: 08/27/2022 Attending physician: Norval Morton, MD Primary Physician:  Merrilee Seashore, MD Outpatient Cardiologist: Rex Kras, DO, [facility] Inpatient Cardiologist: Rex Kras, DO, [facility]  Reason of consultation: Chest Pain  Referring physician: Montine Circle PA-C  Chief complaint: Chest pain  HPI:  Jesus Young is a 76 y.o. Caucasian male who presents with a chief complaint of " chest pain." His past medical history and cardiovascular risk factors include: CAD status post CABG 2012, LBBB, hypertension, insulin-dependent diabetes, hyperlipidemia, chronic kidney disease, polycythemia, history of COVID-19 infection, advanced age.  Patient is accompanied by his wife in ER room 30 at the time of evaluation.  He was last seen in the office yesterday and was doing well.  Patient has stable angina for which she required sublingual nitroglycerin tablets intermittently.  The discomfort is usually resolved after taking 1 sublingual nitroglycerin tablet.  No changes were made to his medical regimen yesterday.  However, yesterday night he will have substernal and left-sided chest pain at rest and with effort related activities around the house.  The pain did not resolve after the third nitro tablet and it radiated to the left shoulder and arm.  Since the pain was different compared to his baseline he came to the ED for further evaluation and management.  Spoke to the ED physician assistant Mr. Marlon Pel patient was placed on IV heparin drip.  Nitro drip was also recommended but since he is asymptomatic it was held off.  I also reviewed the EKG with interventional cardiologist on-call who reinforces that the presenting EKG is not a STEMI.  At the time of evaluation patient did not have any chest pain, shortness of breath, lower extremity swelling, orthopnea, PND, near-syncope,  syncope.  ALLERGIES: Allergies  Allergen Reactions   Clonazepam Other (See Comments)    Headache    Lisinopril Other (See Comments)    headache    PAST MEDICAL HISTORY: Past Medical History:  Diagnosis Date   Anxiety    Arthritis    back and knees   Bronchitis    hx of 2015   Cancer ([place])    prostate   Cataract    immature on right   Chronic back pain    CKD (chronic kidney disease)    Constipation    takes Colace at bedtime   Coronary artery disease    takes Plavix daily but is on hold for surgery   Depression    Diabetes mellitus without complication (HCC)    takes Levemir and Humalog daily   Dizziness    Fall    hx of with injury to left shoulder    GERD (gastroesophageal reflux disease)    takes Protonix daily and Zantac   H/O urinary frequency    Hemorrhoids    History of bladder infections    History of kidney stones    Hyperlipidemia    takes Zocor every evening   Hypertension    takes Metoprolol daily   Hypothyroidism    takes Synthroid daily   Left bundle branch block    Myocardial infarction ([place])    many yrs before 2002   Night muscle spasms    takes Robaxin 4 x day    Nocturia     PAST SURGICAL HISTORY: Past Surgical History:  Procedure Laterality Date   CARDIAC CATHETERIZATION  05/17/02   COLONOSCOPY     CORONARY ANGIOPLASTY  x 1    CORONARY ARTERY BYPASS GRAFT  2002   x 5   eyelid surgery      INGUINAL HERNIA REPAIR Left 05/06/2020   Procedure: LEFT INGUINAL HERNIA REPAIR;  Surgeon: Donnie Mesa, MD;  Location: [place];  Service: General;  Laterality: Left;   INSERTION OF MESH Left 05/06/2020   Procedure: INSERTION OF MESH;  Surgeon: Donnie Mesa, MD;  Location: [place];  Service: General;  Laterality: Left;   LEFT HEART CATH AND CORS/GRAFTS ANGIOGRAPHY N/A 12/05/2018   Procedure: LEFT HEART CATH AND CORS/GRAFTS ANGIOGRAPHY;  Surgeon: Adrian Prows, MD;  Location: [place] [facility];  Service: Cardiovascular;  Laterality: N/A;   LEFT  HEART CATH AND CORS/GRAFTS ANGIOGRAPHY N/A 08/21/2021   Procedure: LEFT HEART CATH AND CORS/GRAFTS ANGIOGRAPHY;  Surgeon: Adrian Prows, MD;  Location: [place] [facility];  Service: Cardiovascular;  Laterality: N/A;   LUMBAR LAMINECTOMY/DECOMPRESSION MICRODISCECTOMY Bilateral 07/18/2013   Procedure: Bilateral Lumbar four-five Lumbar Laminotomy/Microdiskectomy;  Surgeon: Hosie Spangle, MD;  Location: [place] [facility];  Service: Neurosurgery;  Laterality: Bilateral;  Bilateral Lumbar four-five Lumbar Laminotomy/Microdiskectomy   NEPHROLITHOTOMY Left 06/23/2015   Procedure: NEPHROLITHOTOMY PERCUTANEOUS;  Surgeon: [name], MD;  Location: [facility];  Service: Urology;  Laterality: Left;   PROSTATECTOMY  10/2009   skin spots removed from back -precancerous     TONSILLECTOMY      FAMILY HISTORY: The patient's family history includes Heart attack in his father; Heart disease in his father; Thyroid disease in his sister.   SOCIAL HISTORY:  The patient  reports that he has never smoked. He has never used smokeless tobacco. He reports that he does not drink alcohol and does not use drugs.  MEDICATIONS: Current Outpatient Medications  Medication Instructions   acetaminophen (TYLENOL) 1,300 mg, Oral, 2 times daily   aspirin EC 81 mg, Oral, Daily at bedtime   B-D ULTRAFINE III SHORT PEN 31G X 8 MM MISC USE UTD   busPIRone (BUSPAR) 20 mg, Oral, 2 times daily   Calcium Citrate-Vitamin D 315-5 MG-MCG TABS 1 tablet, Oral, Daily   Docusate Sodium (GENTLE STOOL SOFTENER PO) 600 mg, Oral, Daily   famotidine (PEPCID) 40 mg, Oral, Daily at bedtime   furosemide (LASIX) 40 mg, Oral, Daily PRN   insulin detemir (LEVEMIR) 16 Units, Subcutaneous, 2 times daily   insulin lispro (HUMALOG) 5-9 Units, Subcutaneous, 2 times daily, 5 units at lunch, and 9 units after dinner.   isosorbide mononitrate (IMDUR) 60 mg, Oral, Daily   levothyroxine (SYNTHROID) 88 mcg, Oral, Daily   meclizine (ANTIVERT) 25 mg, Oral, 3 times daily  PRN   metoprolol tartrate (LOPRESSOR) 50 mg, Oral, 2 times daily   Multiple Vitamins-Minerals (CENTRUM SILVER) tablet 1 tablet, Oral, Daily   nitroGLYCERIN (NITROSTAT) 0.4 mg, Sublingual, Every 5 min PRN   ONE TOUCH ULTRA TEST test strip No dose, route, or frequency recorded.   pantoprazole (PROTONIX) 40 mg, Oral, Daily at bedtime   ranolazine (RANEXA) 1,000 mg, Oral, 2 times daily   rosuvastatin (CRESTOR) 40 mg, Oral, Daily at bedtime   ticagrelor (BRILINTA) 90 MG TABS tablet TAKE 1 TABLET(90 MG) BY MOUTH TWICE DAILY    REVIEW OF SYSTEMS: Review of Systems  Cardiovascular:  Positive for chest pain (See HPI) and dyspnea on exertion (Chronic and stable). Negative for claudication, irregular heartbeat, leg swelling, near-syncope, orthopnea, palpitations, paroxysmal nocturnal dyspnea and syncope.  Respiratory:  Negative for shortness of breath.   Hematologic/Lymphatic: Negative for bleeding problem.  Musculoskeletal:  Negative  for muscle cramps and myalgias.  Neurological:  Negative for dizziness and light-headedness.  All other systems reviewed and are negative.  PHYSICAL EXAM: Today's Vitals   08/27/22 0845 08/27/22 0930 08/27/22 1015 08/27/22 1030  BP: 127/69 (!) 132/94 122/63 (!) 121/45  Pulse: 68 66 78 72  Resp: 12 12 (!) 22 20  Temp:    98 F (36.7 C)  TempSrc:    Oral  SpO2: 96% 97% 98% 100%  PainSc:    1    Temp:  [97.6 F (36.4 C)-98 F (36.7 C)] 98 F (36.7 C) (10/13 1030) Pulse Rate:  [66-93] 72 (10/13 1030) Resp:  [12-22] 20 (10/13 1030) BP: (118-138)/(45-94) 121/45 (10/13 1030) SpO2:  [90 %-100 %] 100 % (10/13 1030) Weight:  [78.5 kg] 78.5 kg (10/12 1438)  There is no height or weight on file to calculate BMI.   Intake/Output Summary (Last 24 hours) at 08/27/2022 1230 Last data filed at 08/27/2022 0755 Gross per 24 hour  Intake 250 ml  Output --  Net 250 ml    Net IO Since Admission: 250 mL [08/27/22 1230]  Physical Exam  Constitutional: No distress.   Age appropriate, hemodynamically stable.   Neck: No JVD present.  Cardiovascular: Normal rate, regular rhythm, S1 normal, S2 normal, intact distal pulses and normal pulses. Exam reveals no gallop, no S3 and no S4.  Murmur heard. Holosystolic murmur is present with a grade of 3/6 at the apex. Pulmonary/Chest: Effort normal and breath sounds normal. No stridor. He has no wheezes. He has no rales.  Abdominal: Soft. Bowel sounds are normal. He exhibits no distension. There is no abdominal tenderness.  Musculoskeletal:        General: No edema.     Cervical back: Neck supple.  Neurological: He is alert and oriented to person, place, and time. He has intact cranial nerves (2-12).  Skin: Skin is warm and moist.   RADIOLOGY: DG Chest 2 View  Result Date: 08/27/2022 CLINICAL DATA:  Chest pain EXAM: CHEST - 2 VIEW COMPARISON:  08/21/2021 FINDINGS: The heart size and mediastinal contours are within normal limits. Both lungs are clear. The visualized skeletal structures are unremarkable. IMPRESSION: No active cardiopulmonary disease. Electronically Signed   By: Ulyses Jarred M.D.   On: 08/27/2022 03:36    LABORATORY DATA: Lab Results  Component Value Date   WBC 5.4 08/27/2022   HGB 11.9 (L) 08/27/2022   HCT 35.0 (L) 08/27/2022   MCV 93.3 08/27/2022   PLT 167 08/27/2022    Recent Labs  Lab 08/23/22 1035 08/27/22 0309  NA 133* 133*  K 3.3* 2.9*  CL 92* 93*  CO2 32 26  BUN 47* 44*  CREATININE 2.97* 3.09*  CALCIUM 9.3 8.7*  PROT 7.4  --   BILITOT 0.7  --   ALKPHOS 41  --   ALT 22  --   AST 22  --   GLUCOSE 202* 327*    Lipid Panel  No results found for: "CHOL", "HDL", "LDLCALC", "LDLDIRECT", "TRIG", "CHOLHDL"  BNP (last 3 results) Recent Labs    08/28/21 1459 11/12/21 1333 01/28/22 1500  BNP 784.1* 431.4* 611.6*    HEMOGLOBIN A1C Lab Results  Component Value Date   HGBA1C 7.7 (H) 08/27/2022   MPG 174.29 08/27/2022    Cardiac Panel (last 3 results) Recent Labs     08/27/22 0309 08/27/22 0518 08/27/22 0819  TROPONINIHS 26* 59* 107*     TSH Recent Labs    08/27/22 3474  TSH 2.142     CARDIAC DATABASE: EKG: 08/27/2022:  NSR, 92 bpm, LBBB, ST-T changes likely secondary to BBB but underlying ischemia cannot be ruled out.  Normal sinus rhythm, 93 bpm, LBBB, ST-T changes likely secondary to BBB but underlying ischemia cannot be ruled out.  Normal sinus rhythm, 83 bpm, LBBB, ST-T changes likely secondary to BBB but underlying ischemia cannot be ruled out.  Echocardiogram: 11/26/2021:  Mildly depressed LV systolic function with visual EF 40-45%. Left  ventricle cavity is mildly dilated. Mild left ventricular hypertrophy.  Doppler evidence of grade I (impaired) diastolic dysfunction, normal LAP.  Left ventricle regional wall motion findings: Basal anteroseptal, Basal  anterior, Basal inferoseptal, Mid anteroseptal, Mid anterior, Mid  inferoseptal and Apical anterior hypokinesis.  Mild (Grade I) mitral regurgitation.  Mild pulmonic regurgitation.  Compared to study 07/08/2021 LVEF improved from 30-35% to 40-45% with RWMA,  Moderate MR is now mild, otherwise no significant change.   Stress Testing:  Lexiscan Tetrofosmin stress test 11/24/2020: Lexiscan nuclear stress test performed using 1-day protocol. Rest and stress EKG showed sinus rhythm, IVCD/tachycardia. Normal myocardial perfusion. Stress LVEF 25%. High risk study due to low stress LVEF.   Heart Catheterization: 08/21/2021 LV: 124/7, EDP 13 mmHg.  Ao 122/70, mean 92 mmHg.  No pressure gradient across the aortic valve. LM: Diffusely diseased with occluded proximal LAD and proximal circumflex with minor branches evident. RCA: Occluded in the midsegment and severely diseased proximally. SVG to RCA: Proximal to mid segment has a ulcerated 80 to 90% stenosis.  Native vessel itself in the PL branch has a diffuse 90% stenosis and PDA has 60 to 70% stenosis.  This is unchanged from prior  cardiac catheterization. Successful direct stenting with use of a spider filter wire for distal protection and implantation of a 4.0 x 16 mm Synergy XD at 16 atmospheric pressure, 60 seconds, stenosis reduced to 0%.  TIMI-3 to TIMI-3 flow. SVG to D1: Occluded. Free radial graft to OM1: Widely patent. LIMA to LAD: Widely patent.  There is mild to moderate disease in the native LAD which appears to have improved from prior cardiac catheterization.  IMPRESSION & RECOMMENDATIONS: WARRICK LLERA is a 76 y.o. Caucasian male whose past medical history and cardiovascular risk factors include: CAD status post CABG in 2012, LBBB, HTN, IDDM, hyperlipidemia, chronic kidney disease, polycythemia, history of COVID-19 infection, advanced age.  Impression:  NSTEMI Acute on chronic kidney disease Established CAD, status post CABG 2012, PCI to the SVG/ramus graft (08/2021) with baseline stable angina symptoms. Chronic systolic and diastolic heart failure, stage C, NYHA Class II Hypokalemia Diabetes mellitus type 2 Hyperlipidemia Polycythemia  Plan:  Given his symptoms of anginal discomfort, elevated troponins, and EKG changes patient rules in for NSTEMI.  Patient has had complex cardiovascular history with surgical vascularization and PCI to SVG/RCA graft in October 2022.  He has baseline stable angina requiring sublingual nitroglycerin tablets intermittent.  However, prior to arrival he required at least 3 tablets without symptom relief and therefore EMS was activated.  Currently he is chest pain-free and hemodynamically stable.  However, compared to a year ago his renal function has deteriorated significantly.  Last October 2022 his serum creatinine was around 1.9 mg/dL and today it is 3.09 mg/dL.  I have spoken to both the patient and his wife at length that undergoing elective cath with his renal function predisposes him to worsening CKD with high probability of requiring hemodialysis.  On the  contrary, his antianginal therapy includes Ranexa 1000  mg twice daily, Imdur, metoprolol, and sublingual nitroglycerin tablet as needed.  We will continue IV heparin for 48 hours.  Trend troponin until it peaks (likely to elevate due to presenting symptoms and AKI).   Echo will be ordered to evaluate for structural heart disease and left ventricular systolic function.  Currently on IV fluids with close monitoring of symptoms and will hold Lasix for now until evaluated by nephrology.  Have requested primary team to consult nephrology given his renal function.  Patient not a candidate for coronary CTA for similar reasons mentioned above.  Undergoing stress test could be considered however given his reduced LVEF study would likely be high risk but could still be utilized to evaluate the ischemic burden. However, if his symptoms are well controlled over the next 48 hours would recommend outpatient follow-up at [facility] for second opinion versus candidacy for enhanced external counterpulsation.  Plan of care was discussed at great length with interventional cardiologist Dr. Virgina Jock.  I have conveyed the recommendations to both the patient and his wife as well.  They both voiced their wishes with regards to conservative management for now as his symptoms are now controlled to preserve renal function as much as possible; however, if change in clinical status they would consider angiography and accept the risk of worsening renal function requiring the need for dialysis.  Will await nephrology recommendations w/ regards to the risk of CKD progression and volume management.   Will check fasting lipids, lpa, direct LDL,   Recommended better glycemia control, recent a1c 7.7.   Anemia workup per primary team.   Will follow the patient with you.    Total critical care time: 90 minutes Time spent personally by me on the following activities: evaluating the patient as he presents w/ NSTEMI, development of  treatment plan, discussions with ED providers, interventional cardiologist,  plan of care discussed with patient and wife, evaluation of patient's response to treatment, examination of patient, obtaining history from patient and wife, ordering and performing treatments and interventions, ordering and review of laboratory studies, ordering and review of radiographic studies, pulse oximetry and re-evaluation of patient's condition.  This note was created using a voice recognition software as a result there may be grammatical errors inadvertently enclosed that do not reflect the nature of this encounter. Every attempt is made to correct such errors.  Mechele Claude [facility]  Pager: [phone] Office: [phone] 08/27/2022, 12:30 PM

## 2022-08-27 NOTE — ED Provider Notes (Signed)
  Physical Exam  BP 131/66   Pulse 74   Temp 97.6 F (36.4 C) (Oral)   Resp 20   SpO2 90%   Physical Exam Vitals and nursing note reviewed.  Constitutional:      General: He is not in acute distress.    Appearance: Normal appearance. He is not ill-appearing, toxic-appearing or diaphoretic.  HENT:     Head: Normocephalic and atraumatic.     Nose: Nose normal. No congestion.     Mouth/Throat:     Mouth: Mucous membranes are moist.     Pharynx: Oropharynx is clear.  Eyes:     Extraocular Movements: Extraocular movements intact.     Conjunctiva/sclera: Conjunctivae normal.     Pupils: Pupils are equal, round, and reactive to light.  Cardiovascular:     Rate and Rhythm: Normal rate.  Pulmonary:     Effort: Pulmonary effort is normal.     Breath sounds: Normal breath sounds.  Abdominal:     General: Abdomen is flat. Bowel sounds are normal.     Palpations: Abdomen is soft.     Tenderness: There is no abdominal tenderness.  Musculoskeletal:     Cervical back: Normal range of motion and neck supple.  Skin:    General: Skin is warm and dry.     Capillary Refill: Capillary refill takes less than 2 seconds.  Neurological:     Mental Status: He is alert and oriented to person, place, and time.     Procedures  Procedures  ED Course / MDM   Clinical Course as of 08/27/22 0749  Fri Aug 27, 2022  0631 Consult placed by me to Dr. Terri Skains.  Awaiting call back. [RB]  [id number] Dr. Terri Skains to consult.  Asks for medicine to admit due to DM and renal insufficiency.  Starting heparin and nitro. [RB]    Clinical Course User Index [RB] Montine Circle, PA-C   Medical Decision Making Amount and/or Complexity of Data Reviewed Labs: ordered. Radiology: ordered.  Risk Prescription drug management. Decision regarding hospitalization.   Patient signed out to me at shift change.  Please see previous provider note for further details.  Patient signed out to me pending hospitalist  admission.  Patient has been discussed with Dr. Tamala Julian of [facility] hospitalist team, Dr. Tamala Julian has agreed to admit the patient for further management.  The patient is aware, amenable to the plan.  Patient in no apparent distress at this time.  Dr. Terri Skains, cardiology, has requested this patient remain n.p.o.  Orders have been placed, this has been communicated to Dr. Tamala Julian.       Azucena Cecil, PA-C 08/27/22 0749    Elgie Congo, MD 08/27/22 [phone]

## 2022-08-27 NOTE — ED Provider Notes (Signed)
[facility] Emergency Department Provider Note MRN:  [id number]  Arrival date & time: 08/28/22     Chief Complaint   Chest Pain   History of Present Illness   Jesus Young is a 76 y.o. year-old male presents to the ED with chief complaint of chest pain.  States that he has history of significant heart disease.  Sees Dr. Terri Skains.  States that he has been having left sided chest pain and arm pain that started around 2300 last night.  States that he took his nitro with good relief, but then the symptoms returned.  He has take 3 nitro at home with some improvement.  States that EMS was called at that point for chest pain without exertional component.  He denies SOB, nausea, or diaphoresis.  Denies any other associated symptoms.  Has had '324mg'$  of ASA PTA.  History provided by patient.   Review of Systems  Pertinent positive and negative review of systems noted in HPI.    Physical Exam   Vitals:   08/27/22 1614 08/27/22 2235  BP: 122/69 121/63  Pulse: 74 77  Resp: 18 19  Temp: (!) 97.4 F (36.3 C) 97.6 F (36.4 C)  SpO2: 100% 97%    CONSTITUTIONAL:  well-appearing, NAD NEURO:  Alert and oriented x 3, CN 3-12 grossly intact EYES:  eyes equal and reactive ENT/NECK:  Supple, no stridor  CARDIO:  normal rate, regular rhythm, appears well-perfused  PULM:  No respiratory distress, CTAB GI/GU:  non-distended, non-tender MSK/SPINE:  No gross deformities, no edema, moves all extremities  SKIN:  no rash, atraumatic   *Additional and/or pertinent findings included in MDM below  Diagnostic and Interventional Summary    EKG Interpretation  Date/Time:  Friday August 27 2022 04:42:20 EDT Ventricular Rate:  83 PR Interval:  206 QRS Duration: 179 QT Interval:  474 QTC Calculation: 557 R Axis:   163 Text Interpretation: Sinus rhythm Left bundle branch block Confirmed by Merrily Pew [phone]) on 08/27/2022 6:44:22 AM       Labs Reviewed  BASIC  METABOLIC PANEL - Abnormal; Notable for the following components:      Result Value   Sodium 133 (*)    Potassium 2.9 (*)    Chloride 93 (*)    Glucose, Bld 327 (*)    BUN 44 (*)    Creatinine, Ser 3.09 (*)    Calcium 8.7 (*)    GFR, Estimated 20 (*)    All other components within normal limits  CBC - Abnormal; Notable for the following components:   RBC 3.89 (*)    Hemoglobin 12.4 (*)    HCT 36.3 (*)    All other components within normal limits  HEPARIN LEVEL (UNFRACTIONATED) - Abnormal; Notable for the following components:   Heparin Unfractionated 0.26 (*)    All other components within normal limits  HEMOGLOBIN AND HEMATOCRIT, BLOOD - Abnormal; Notable for the following components:   Hemoglobin 11.9 (*)    HCT 35.0 (*)    All other components within normal limits  HEMOGLOBIN A1C - Abnormal; Notable for the following components:   Hgb A1c MFr Bld 7.7 (*)    All other components within normal limits  URINALYSIS, ROUTINE W REFLEX MICROSCOPIC - Abnormal; Notable for the following components:   Glucose, UA 50 (*)    Protein, ur 30 (*)    All other components within normal limits  BRAIN NATRIURETIC PEPTIDE - Abnormal; Notable for the following components:   B  Natriuretic Peptide 972.2 (*)    All other components within normal limits  GLUCOSE, CAPILLARY - Abnormal; Notable for the following components:   Glucose-Capillary 237 (*)    All other components within normal limits  CBG MONITORING, ED - Abnormal; Notable for the following components:   Glucose-Capillary 200 (*)    All other components within normal limits  TROPONIN I (HIGH SENSITIVITY) - Abnormal; Notable for the following components:   Troponin I (High Sensitivity) 26 (*)    All other components within normal limits  TROPONIN I (HIGH SENSITIVITY) - Abnormal; Notable for the following components:   Troponin I (High Sensitivity) 59 (*)    All other components within normal limits  TROPONIN I (HIGH SENSITIVITY) -  Abnormal; Notable for the following components:   Troponin I (High Sensitivity) 107 (*)    All other components within normal limits  MAGNESIUM  TSH  CREATININE, URINE, RANDOM  SODIUM, URINE, RANDOM  UREA NITROGEN, URINE  HEPARIN LEVEL (UNFRACTIONATED)  CBC  RENAL FUNCTION PANEL  CBG MONITORING, ED    US RENAL  Final Result    DG Chest 2 View  Final Result      Medications  heparin ADULT infusion 100 units/mL (25000 units/28m) (1,050 Units/hr Intravenous Infusion Verify 08/27/22 1951)  aspirin EC tablet 81 mg (81 mg Oral Given 08/27/22 2117)  metoprolol tartrate (LOPRESSOR) tablet 50 mg (50 mg Oral Given 08/27/22 2117)  rosuvastatin (CRESTOR) tablet 40 mg (40 mg Oral Given 08/27/22 2116)  busPIRone (BUSPAR) tablet 20 mg (20 mg Oral Given 08/27/22 2116)  levothyroxine (SYNTHROID) tablet 88 mcg (88 mcg Oral Given 08/27/22 0837)  meclizine (ANTIVERT) tablet 25 mg (has no administration in time range)  famotidine (PEPCID) tablet 40 mg (40 mg Oral Given 08/27/22 2117)  ticagrelor (BRILINTA) tablet 90 mg (90 mg Oral Given 08/27/22 2117)  isosorbide mononitrate (IMDUR) 24 hr tablet 60 mg (60 mg Oral Given 08/27/22 1113)  ranolazine (RANEXA) 12 hr tablet 1,000 mg (1,000 mg Oral Given 08/27/22 2117)  sodium chloride flush (NS) 0.9 % injection 3 mL (3 mLs Intravenous Given 08/27/22 2120)  acetaminophen (TYLENOL) tablet 650 mg (has no administration in time range)    Or  acetaminophen (TYLENOL) suppository 650 mg (has no administration in time range)  albuterol (PROVENTIL) (2.5 MG/3ML) 0.083% nebulizer solution 2.5 mg (has no administration in time range)  trimethobenzamide (TIGAN) injection 200 mg (has no administration in time range)  insulin aspart (novoLOG) injection 0-15 Units (5 Units Subcutaneous Given 08/27/22 1659)  0.9 %  sodium chloride infusion ( Intravenous Infusion Verify 08/27/22 1951)  insulin detemir (LEVEMIR) injection 25 Units (25 Units Subcutaneous Not Given  08/27/22 1110)  insulin detemir (LEVEMIR) injection 20 Units (20 Units Subcutaneous Given 08/27/22 1731)  perflutren lipid microspheres (DEFINITY) IV suspension (4 mLs Intravenous Given 08/27/22 1043)  docusate sodium (COLACE) capsule 600 mg (600 mg Oral Given 08/27/22 1704)  morphine (PF) 4 MG/ML injection 2 mg (2 mg Intravenous Given 08/27/22 0401)  potassium chloride SA (KLOR-CON M) CR tablet 40 mEq (40 mEq Oral Given 08/27/22 0419)  magnesium sulfate IVPB 2 g 50 mL (0 g Intravenous Stopped 08/27/22 0630)  potassium chloride 10 mEq in 100 mL IVPB (0 mEq Intravenous Stopped 08/27/22 0755)  heparin bolus via infusion 4,000 Units (4,000 Units Intravenous Bolus from Bag 08/27/22 0842)     Procedures  /  Critical Care .Critical Care  Performed by: BMontine Circle PA-C Authorized by: BMontine Circle PA-C   Critical care provider statement:  Critical care time (minutes):  40   Critical care was necessary to treat or prevent imminent or life-threatening deterioration of the following conditions:  Circulatory failure and metabolic crisis   Critical care was time spent personally by me on the following activities:  Development of treatment plan with patient or surrogate, discussions with consultants, evaluation of patient's response to treatment, examination of patient, ordering and review of laboratory studies, ordering and review of radiographic studies, ordering and performing treatments and interventions, pulse oximetry, re-evaluation of patient's condition and review of old charts   ED Course and Medical Decision Making  I have reviewed the triage vital signs, the nursing notes, and pertinent available records from the EMR.  Social Determinants Affecting Complexity of Care: Patient has no clinically significant social determinants affecting this chief complaint..   ED Course: Clinical Course as of 08/28/22 0322  Fri Aug 27, 2022  0631 Consult placed by me to Dr. Terri Skains.  Awaiting  call back. [RB]  [id number] Dr. Terri Skains to consult.  Asks for medicine to admit due to DM and renal insufficiency.  Starting heparin and nitro. [RB]    Clinical Course User Index [RB] Montine Circle, PA-C    Medical Decision Making Patient here with left sided chest pain and arm pain that started last night.  Has had some persistent symptoms during the ED evaluation, but symptoms are much improved, though he still notes a heaviness sensation to his left chest.  He denies SOB, nausea, or diaphoresis.  Initial trop is elevated at 26, will check delta trop.  CXR without evidence of active disease.    EKG reviewed with Dr. Dayna Barker is slightly different from yesterday's office visit.      Amount and/or Complexity of Data Reviewed Labs: ordered. Radiology: ordered.  Risk Prescription drug management. Decision regarding hospitalization.     Consultants: I discussed the case with Dr. Terri Skains, who recommends the above (see ED course).   Treatment and Plan: Patient's exam and diagnostic results are concerning for ACS.  Feel that patient will need admission to the hospital for further treatment and evaluation.    Final Clinical Impressions(s) / ED Diagnoses     ICD-10-CM   1. Chest pain, unspecified type  R07.9       ED Discharge Orders     None         Discharge Instructions Discussed with and Provided to Patient:   Discharge Instructions   None      Montine Circle, PA-C 08/28/22 0323    Mesner, Corene Cornea, MD 08/28/22 [phone]

## 2022-08-27 NOTE — Progress Notes (Signed)
Jesus Young for heparin  Indication: chest pain/ACS  Allergies  Allergen Reactions   Clonazepam Other (See Comments)    Headache    Lisinopril Other (See Comments)    headache    Patient Measurements: Height: '6\' 3"'$  (190.5 cm) Weight: 78.7 kg (173 lb 8 oz) IBW/kg (Calculated) : 84.5 Heparin Dosing Weight: 78.5kg   Vital Signs: Temp: 97.4 F (36.3 C) (10/13 1614) Temp Source: Oral (10/13 1614) BP: 122/69 (10/13 1614) Pulse Rate: 74 (10/13 1614)  Labs: Recent Labs    08/27/22 0309 08/27/22 0518 08/27/22 0819 08/27/22 1656  HGB 12.4*  --  11.9*  --   HCT 36.3*  --  35.0*  --   PLT 167  --   --   --   HEPARINUNFRC  --   --   --  0.26*  CREATININE 3.09*  --   --   --   TROPONINIHS 26* 59* 107*  --      Estimated Creatinine Clearance: 22.6 mL/min (A) (by C-G formula based on SCr of 3.09 mg/dL (H)).   Assessment: Patient presented with a CC of chest pain, left shoulder/pain. Patient not on anticoagulation at home, only Brilinta. Trop 59, pharmacy consulted to dose heparin.   Heparin level slightly subtherapeutic (0.26) on infusion at 900 units/hr. No issues with line or bleeding reported per RN.  Goal of Therapy:  Heparin level 0.3-0.7 units/ml Monitor platelets by anticoagulation protocol: Yes   Plan:  Increase heparin infusion to 1050 units/hr Will f/u 8hr heparin level  Sherlon Handing, PharmD, BCPS Please see amion for complete clinical pharmacist phone list 08/27/2022,6:19 PM

## 2022-08-27 NOTE — Consult Note (Addendum)
Reason for Consult: AKI/CKD stage IIIb Referring Physician: Tamala Julian, MD  Jesus Young is an 76 y.o. male with a PMH significant for DM type 2, HTN, CAD s/p CABGx4, NSTEMI s/p stent of RCA (08/21/21), HLD, chronic systolic and diastolic CHF, prostate cancer s/p prostatectomy, long covid, primary polycythemia, and CKD stage IIIb-IV (followed by Dr. Posey Pronto at [facility]) who presented to [facility] ED on 08/27/22 with chest pain radiating down his left arm that did not subside after 3 SL NTG doses.  EMS was called.  In the ED, VSS but still had some chest and arm pain.  Labs were notable for Hgb 12.4, Na 133, K 2.9, BUN 44, Cr 3.09, gluc 327, troponin 59.  CXR showed NAD.  ECG without ischemic changes and he was started on IV morphine, IV Mg, and heparin drip with resolution of chest pain.  Cardiology was consulted for NSTEMI.  We were consulted due to the development of AKI/CKD.  His trend in Scr is seen below.  He has had multiple changes to his furosemide dose due to episodes of AKI/CKD.  At his last visit on 08/04/22, his furosemide was increased from 80 mg daily to bid due to worsening DOE and cough which resolved his symptoms.  He admits that he was concerned he was on too much furosemide and this was held upon admission.   He denies any N/V/D, dysuria, pyuria, hematuria, urgency, frequency, retention, lower extremity edema, orthopnea, or PND.  Trend in Creatinine:  Creatinine, Ser  Date/Time Value Ref Range Status  08/27/2022 03:09 AM 3.09 (H) 0.61 - 1.24 mg/dL Final  08/23/2022 10:35 AM 2.97 (H) 0.61 - 1.24 mg/dL Final  08/04/2022  10:22 AM 2.39 (H) 0.76 - 1.27 mg/dL Final  07/21/2022  02:34 PM 1.90 (H) 0.76 - 1.27 mg/dL Final  06/16/2022 2.88 (H) 0.76 - 1.27 mg/dL Final  04/09/2022 2.67 (H) 0.76 - 1.27 mg/dL Final  03/12/2022 2.25 (H) 0.76 - 1.27 mg/dL Final  01/28/2022 03:00 PM 1.84 (H) 0.76 - 1.27 mg/dL Final  01/01/2022 02:03 PM 2.23 (H) 0.76 - 1.27 mg/dL Final  11/23/2021 03:14 PM 2.23 (H) 0.76 -  1.27 mg/dL Final  11/12/2021 01:33 PM 2.02 (H) 0.76 - 1.27 mg/dL Final  08/28/2021 02:59 PM 1.87 (H) 0.76 - 1.27 mg/dL Final  08/22/2021 01:15 AM 1.80 (H) 0.61 - 1.24 mg/dL Final  08/21/2021 01:45 AM 2.13 (H) 0.61 - 1.24 mg/dL Final  07/29/2021 11:08 AM 1.99 (H) 0.76 - 1.27 mg/dL Final  07/15/2021 10:12 AM 1.92 (H) 0.76 - 1.27 mg/dL Final  07/06/2021 11:43 AM 1.98 (H) 0.76 - 1.27 mg/dL Final  06/29/2021 01:03 PM 1.66 (H) 0.76 - 1.27 mg/dL Final  04/30/2020 11:00 AM 1.76 (H) 0.61 - 1.24 mg/dL Final  10/22/2017 02:02 PM 1.42 (H) 0.61 - 1.24 mg/dL Final  11/21/2016 08:17 AM 1.57 (H) 0.61 - 1.24 mg/dL Final  10/30/2015 04:28 AM 1.68 (H) 0.61 - 1.24 mg/dL Final  07/22/2015 11:21 PM 2.00 (H) 0.61 - 1.24 mg/dL Final  07/22/2015 11:13 PM 2.11 (H) 0.61 - 1.24 mg/dL Final  06/24/2015 04:58 AM 1.70 (H) 0.61 - 1.24 mg/dL Final  06/23/2015 07:28 PM 1.56 (H) 0.61 - 1.24 mg/dL Final  06/23/2015 12:00 PM 1.62 (H) 0.61 - 1.24 mg/dL Final  06/18/2015 02:45 PM 2.04 (H) 0.61 - 1.24 mg/dL Final  05/10/2015 03:29 PM 2.00 (H) 0.61 - 1.24 mg/dL Final  05/10/2015 12:12 PM 2.15 (H) 0.61 - 1.24 mg/dL Final  07/12/2013 02:50 PM 1.58 (H) 0.50 - 1.35 mg/dL  Final  11/19/2009 01:44 AM 1.7 (H) 0.4 - 1.5 mg/dL Final  11/14/2009 05:15 AM 1.51 (H) 0.4 - 1.5 mg/dL Final  11/13/2009 04:20 PM 1.58 (H) 0.4 - 1.5 mg/dL Final  11/13/2009 11:21 AM 1.80 (H) 0.4 - 1.5 mg/dL Final  11/11/2009 10:33 AM 1.73 (H) 0.4 - 1.5 mg/dL Final    PMH:   Past Medical History:  Diagnosis Date   Anxiety    Arthritis    back and knees   Bronchitis    hx of 2015   Cancer ([place])    prostate   Cataract    immature on right   Chronic back pain    CKD (chronic kidney disease)    Constipation    takes Colace at bedtime   Coronary artery disease    takes Plavix daily but is on hold for surgery   Depression    Diabetes mellitus without complication ([place])    takes Levemir and Humalog daily   Dizziness    Fall    hx of with injury to  left shoulder    GERD (gastroesophageal reflux disease)    takes Protonix daily and Zantac   H/O urinary frequency    Hemorrhoids    History of bladder infections    History of kidney stones    Hyperlipidemia    takes Zocor every evening   Hypertension    takes Metoprolol daily   Hypothyroidism    takes Synthroid daily   Left bundle branch block    Myocardial infarction ([place])    many yrs before 2002   Night muscle spasms    takes Robaxin 4 x day    Nocturia     PSH:   Past Surgical History:  Procedure Laterality Date   CARDIAC CATHETERIZATION  05/17/02   COLONOSCOPY     CORONARY ANGIOPLASTY     x 1    CORONARY ARTERY BYPASS GRAFT  2002   x 5   eyelid surgery      INGUINAL HERNIA REPAIR Left 05/06/2020   Procedure: LEFT INGUINAL HERNIA REPAIR;  Surgeon: Donnie Mesa, MD;  Location: [place];  Service: General;  Laterality: Left;   INSERTION OF MESH Left 05/06/2020   Procedure: INSERTION OF MESH;  Surgeon: Donnie Mesa, MD;  Location: [place];  Service: General;  Laterality: Left;   LEFT HEART CATH AND CORS/GRAFTS ANGIOGRAPHY N/A 12/05/2018   Procedure: LEFT HEART CATH AND CORS/GRAFTS ANGIOGRAPHY;  Surgeon: Adrian Prows, MD;  Location: [place] [facility];  Service: Cardiovascular;  Laterality: N/A;   LEFT HEART CATH AND CORS/GRAFTS ANGIOGRAPHY N/A 08/21/2021   Procedure: LEFT HEART CATH AND CORS/GRAFTS ANGIOGRAPHY;  Surgeon: Adrian Prows, MD;  Location: [place] [facility];  Service: Cardiovascular;  Laterality: N/A;   LUMBAR LAMINECTOMY/DECOMPRESSION MICRODISCECTOMY Bilateral 07/18/2013   Procedure: Bilateral Lumbar four-five Lumbar Laminotomy/Microdiskectomy;  Surgeon: Hosie Spangle, MD;  Location: [place] [facility];  Service: Neurosurgery;  Laterality: Bilateral;  Bilateral Lumbar four-five Lumbar Laminotomy/Microdiskectomy   NEPHROLITHOTOMY Left 06/23/2015   Procedure: NEPHROLITHOTOMY PERCUTANEOUS;  Surgeon: [name], MD;  Location: [facility];  Service: Urology;  Laterality: Left;    PROSTATECTOMY  10/2009   skin spots removed from back -precancerous     TONSILLECTOMY      Allergies:  Allergies  Allergen Reactions   Clonazepam Other (See Comments)    Headache    Lisinopril Other (See Comments)    headache    Medications:   Prior to Admission medications   Medication Sig Start  Date End Date Taking? Authorizing Provider  acetaminophen (TYLENOL) 650 MG CR tablet Take 1,300 mg by mouth in the morning and at bedtime.   Yes [provider]  aspirin EC 81 MG tablet Take 81 mg by mouth at bedtime.    Yes [provider]  busPIRone (BUSPAR) 10 MG tablet Take 20 mg by mouth 2 (two) times daily. 12/31/21  Yes [provider]  Calcium Citrate-Vitamin D 315-5 MG-MCG TABS Take 1 tablet by mouth daily at 12 noon.   Yes [provider]  famotidine (PEPCID) 40 MG tablet Take 40 mg by mouth at bedtime.   Yes [provider]  insulin detemir (LEVEMIR) 100 UNIT/ML injection Inject 0.16 mLs (16 Units total) into the skin 2 (two) times daily. Patient taking differently: Inject 20-25 Units into the skin See admin instructions. Inject 20 units into the skin in the morning and 25 units at night 10/30/15  Yes Ward, Kristen N, DO  insulin lispro (HUMALOG) 100 UNIT/ML injection Inject 0.05-0.09 mLs (5-9 Units total) into the skin 2 (two) times daily. 5 units at lunch, and 9 units after dinner. Patient taking differently: Inject 5-15 Units into the skin 2 (two) times daily. 5-15 units as needed  (sliding scale insulin) 10/30/15  Yes Ward, Cyril Mourning N, DO  isosorbide mononitrate (IMDUR) 60 MG 24 hr tablet Take 1 tablet (60 mg total) by mouth daily. 06/09/22 10/07/22 Yes Cantwell, Celeste C, PA-C  levothyroxine (SYNTHROID) 88 MCG tablet Take 88 mcg by mouth daily. 08/20/22  Yes [provider]  meclizine (ANTIVERT) 25 MG tablet Take 25 mg by mouth 3 (three) times daily as needed (for vertigo). 03/23/22  Yes [provider]  metoprolol  (LOPRESSOR) 50 MG tablet Take 50 mg by mouth 2 (two) times daily.    Yes [provider]  Multiple Vitamins-Minerals (CENTRUM SILVER) tablet Take 1 tablet by mouth daily.   Yes [provider]  nitroGLYCERIN (NITROSTAT) 0.4 MG SL tablet Place 1 tablet (0.4 mg total) under the tongue every 5 (five) minutes as needed for chest pain. 02/01/22  Yes Cantwell, Celeste C, PA-C  pantoprazole (PROTONIX) 40 MG tablet Take 40 mg by mouth at bedtime.  11/16/16  Yes [provider]  ranolazine (RANEXA) 1000 MG SR tablet Take 1 tablet (1,000 mg total) by mouth 2 (two) times daily. 05/08/22  Yes Cantwell, Celeste C, PA-C  rosuvastatin (CRESTOR) 40 MG tablet Take 40 mg by mouth at bedtime.   Yes [provider]  ticagrelor (BRILINTA) 90 MG TABS tablet TAKE 1 TABLET(90 MG) BY MOUTH TWICE DAILY Patient taking differently: Take 90 mg by mouth 2 (two) times daily. 06/28/22  Yes Adrian Prows, MD  B-D ULTRAFINE III SHORT PEN 31G X 8 MM MISC USE UTD 01/18/17   [provider]  Docusate Sodium (GENTLE STOOL SOFTENER PO) Take 600 mg by mouth daily.    [provider]  furosemide (LASIX) 40 MG tablet Take 1 tablet (40 mg total) by mouth daily as needed for fluid or edema. Patient not taking: Reported on 08/27/2022 07/16/21 08/26/22  Alethia Berthold, PA-C  ONE TOUCH ULTRA TEST test strip  01/21/17   [provider]    Inpatient medications:  aspirin EC  81 mg Oral QHS   busPIRone  20 mg Oral BID   famotidine  40 mg Oral QHS   insulin aspart  0-15 Units Subcutaneous TID WC   insulin detemir  20 Units Subcutaneous QPM   insulin detemir  25 Units Subcutaneous q AM   isosorbide mononitrate  60 mg Oral Daily   levothyroxine  88 mcg Oral Daily   metoprolol tartrate  50 mg Oral BID   ranolazine  1,000 mg Oral BID   rosuvastatin  40 mg Oral QHS   sodium chloride flush  3 mL Intravenous Q12H   ticagrelor  90 mg Oral BID    Discontinued Meds:   Medications  Discontinued During This Encounter  Medication Reason   predniSONE (DELTASONE) 10 MG tablet No longer needed (for PRN medications)   nitroGLYCERIN 50 mg in dextrose 5 % 250 mL (0.2 mg/mL) infusion    Calcium Citrate-Vitamin D 315-5 MG-MCG TABS 1 tablet P&T Policy: Herbal Product    acetaminophen (TYLENOL) CR tablet 1,740 mg P&T Policy: Herbal Product    insulin detemir (LEVEMIR) injection 20-25 Units    docusate sodium (COLACE) capsule 100 mg     Social History:  reports that he has never smoked. He has never used smokeless tobacco. He reports that he does not drink alcohol and does not use drugs.  Family History:   Family History  Problem Relation Age of Onset   Heart disease Father    Heart attack Father    Thyroid disease Sister     Pertinent items are noted in HPI. Weight change:   Intake/Output Summary (Last 24 hours) at 08/27/2022 1924 Last data filed at 08/27/2022 1615 Gross per 24 hour  Intake 732.65 ml  Output 850 ml  Net -117.35 ml   BP 122/69 (BP Location: Right Arm)   Pulse 74   Temp (!) 97.4 F (36.3 C) (Oral)   Resp 18   Ht '6\' 3"'$  (1.905 m)   Wt 78.7 kg   SpO2 100%   BMI 21.69 kg/m  Vitals:   08/27/22 1400 08/27/22 1423 08/27/22 1500 08/27/22 1614  BP: (!) 103/56  106/64 122/69  Pulse: 69  71 74  Resp: '13  18 18  '$ Temp:  98.1 F (36.7 C)  (!) 97.4 F (36.3 C)  TempSrc:  Oral  Oral  SpO2: 97%  99% 100%  Weight:    78.7 kg  Height:    '6\' 3"'$  (1.905 m)     General appearance: alert, cooperative, and no distress Head: Normocephalic, without obvious abnormality, atraumatic Resp: clear to auscultation bilaterally Cardio: regular rate and rhythm and Holosystolic murmer at apex III/VI, no rub GI: soft, non-tender; bowel sounds normal; no masses,  no organomegaly Extremities: extremities normal, atraumatic, no cyanosis or edema  Labs: Basic Metabolic Panel: Recent Labs  Lab 08/23/22 1035 08/27/22 0309  NA 133* 133*  K 3.3* 2.9*  CL 92* 93*  CO2  32 26  GLUCOSE 202* 327*  BUN 47* 44*  CREATININE 2.97* 3.09*  ALBUMIN 4.3  --   CALCIUM 9.3 8.7*   Liver Function Tests: Recent Labs  Lab 08/23/22 1035  AST 22  ALT 22  ALKPHOS 41  BILITOT 0.7  PROT 7.4  ALBUMIN 4.3   No results for input(s): "LIPASE", "AMYLASE" in the last 168 hours. No results for input(s): "AMMONIA" in the last 168 hours. CBC: Recent Labs  Lab 08/23/22 1035 08/27/22 0309 08/27/22 0819  WBC 5.9 5.4  --   NEUTROABS 4.3  --   --   HGB 14.3 12.4* 11.9*  HCT 41.7 36.3* 35.0*  MCV 92.7 93.3  --   PLT 199 167  --    PT/INR: '@LABRCNTIP'$ (inr:5) Cardiac Enzymes: )No results for input(s): "CKTOTAL", "CKMB", "CKMBINDEX", "  TROPONINI" in the last 168 hours. CBG: Recent Labs  Lab 08/27/22 0814 08/27/22 1240  GLUCAP 200* 73    Iron Studies:  Recent Labs  Lab 08/23/22 1035 08/23/22 1036  IRON 75  --   TIBC 301  --   FERRITIN  --  76    Xrays/Other Studies: ECHOCARDIOGRAM COMPLETE  Result Date: 08/27/2022    ECHOCARDIOGRAM REPORT   Patient Name:   PILAR CORRALES Date of Exam: 08/27/2022 Medical Rec #:  [id number]          Height:       74.0 in Accession #:    [id number]         Weight:       173.0 lb Date of Birth:  1946/02/13          BSA:          2.044 m Patient Age:    25 years           BP:           136/75 mmHg Patient Gender: M                  HR:           117 bpm. Exam Location:  Inpatient Procedure: 2D Echo, Cardiac Doppler, Color Doppler and Intracardiac            Opacification Agent Indications:     Chest Pain R07.9  History:         Patient has prior history of Echocardiogram examinations, most                  recent 11/26/2021. CHF, CAD and NSTEMI, Signs/Symptoms:Chest                  Pain and Dyspnea; Risk Factors:Diabetes, Hypertension and                  Dyslipidemia.  Sonographer:     Greer Pickerel Referring Phys:  [id number] Rex Kras Diagnosing Phys: Rex Kras DO  Sonographer Comments: Image acquisition challenging due to  respiratory motion. IMPRESSIONS  1. Left ventricular ejection fraction, by estimation, is 25 to 30%. The left ventricle has severely decreased function. The left ventricle demonstrates regional wall motion abnormalities (see scoring diagram/findings for description). The left ventricular internal cavity size was dilated. Left ventricular diastolic parameters are consistent with Grade I diastolic dysfunction (impaired relaxation).  2. Right ventricular systolic function is mildly reduced. The right ventricular size is normal. There is normal pulmonary artery systolic pressure.  3. A small pericardial effusion is present.  4. The mitral valve is grossly normal. Mild mitral valve regurgitation.  5. The aortic valve is tricuspid. Aortic valve regurgitation is not visualized. No aortic stenosis is present.  6. The inferior vena cava is normal in size with greater than 50% respiratory variability, suggesting right atrial pressure of 3 mmHg. Comparison(s): Prior study 11/26/2021: LVEF 40-45%, mildly dilated LV cavity, G1DD, mild MR, mild PR, RWMA (basal anteroseptal, basal anterior, basal inferoseptal, mid anteroseptal, mid anterior, mid inferoseptal, apical anterior). Conclusion(s)/Recommendation(s): No left ventricular mural or apical thrombus/thrombi. FINDINGS  Left Ventricle: Left ventricular ejection fraction, by estimation, is 25 to 30%. The left ventricle has severely decreased function. The left ventricle demonstrates regional wall motion abnormalities. Definity contrast agent was given IV to delineate the left ventricular endocardial borders. The left ventricular internal cavity size was dilated. There is no left ventricular hypertrophy. Left ventricular diastolic parameters are consistent  with Grade I diastolic dysfunction (impaired relaxation).  LV Wall Scoring: The entire anterior wall, entire septum, and apex are hypokinetic. Right Ventricle: The right ventricular size is normal. No increase in right  ventricular wall thickness. Right ventricular systolic function is mildly reduced. There is normal pulmonary artery systolic pressure. The tricuspid regurgitant velocity is 0.74 m/s, and with an assumed right atrial pressure of 3 mmHg, the estimated right ventricular systolic pressure is 5.2 mmHg. Left Atrium: Left atrial size was normal in size. Right Atrium: Right atrial size was normal in size. Pericardium: A small pericardial effusion is present. Mitral Valve: The mitral valve is grossly normal. Mild mitral valve regurgitation. Tricuspid Valve: The tricuspid valve is grossly normal. Tricuspid valve regurgitation is trivial. No evidence of tricuspid stenosis. Aortic Valve: The aortic valve is tricuspid. Aortic valve regurgitation is not visualized. No aortic stenosis is present. Aortic valve mean gradient measures 2.0 mmHg. Aortic valve peak gradient measures 2.9 mmHg. Aortic valve area, by VTI measures 2.88 cm. Pulmonic Valve: The pulmonic valve was grossly normal. Pulmonic valve regurgitation is not visualized. No evidence of pulmonic stenosis. Aorta: The aortic root and ascending aorta are structurally normal, with no evidence of dilitation. Venous: The inferior vena cava is normal in size with greater than 50% respiratory variability, suggesting right atrial pressure of 3 mmHg. IAS/Shunts: The interatrial septum was not well visualized. Additional Comments: There is pleural effusion in the left lateral region.  LEFT VENTRICLE PLAX 2D LVIDd:         5.40 cm      Diastology LVIDs:         4.50 cm      LV e' medial:    3.26 cm/s LV PW:         1.10 cm      LV E/e' medial:  26.4 LV IVS:        1.00 cm      LV e' lateral:   8.27 cm/s LVOT diam:     2.20 cm      LV E/e' lateral: 10.4 LV SV:         50 LV SV Index:   25 LVOT Area:     3.80 cm  LV Volumes (MOD) LV vol d, MOD A2C: 247.0 ml LV vol d, MOD A4C: 226.0 ml LV vol s, MOD A2C: 189.0 ml LV vol s, MOD A4C: 186.0 ml LV SV MOD A2C:     58.0 ml LV SV MOD A4C:      226.0 ml LV SV MOD BP:      43.5 ml RIGHT VENTRICLE RV S prime:     8.51 cm/s TAPSE (M-mode): 1.2 cm LEFT ATRIUM             Index        RIGHT ATRIUM           Index LA diam:        4.20 cm 2.06 cm/m   RA Area:     15.20 cm LA Vol (A2C):   47.1 ml 23.05 ml/m  RA Volume:   34.40 ml  16.83 ml/m LA Vol (A4C):   63.4 ml 31.02 ml/m LA Biplane Vol: 56.7 ml 27.75 ml/m  AORTIC VALVE                    PULMONIC VALVE AV Area (Vmax):    2.93 cm     PR End Diast Vel: 2.81 msec AV Area (Vmean):   2.41  cm AV Area (VTI):     2.88 cm AV Vmax:           84.80 cm/s AV Vmean:          67.000 cm/s AV VTI:            0.174 m AV Peak Grad:      2.9 mmHg AV Mean Grad:      2.0 mmHg LVOT Vmax:         65.30 cm/s LVOT Vmean:        42.500 cm/s LVOT VTI:          0.132 m LVOT/AV VTI ratio: 0.76  AORTA Ao Root diam: 3.60 cm Ao Asc diam:  3.40 cm MITRAL VALVE               TRICUSPID VALVE MV Area (PHT): 5.23 cm    TR Peak grad:   2.2 mmHg MV Decel Time: 145 msec    TR Vmax:        74.50 cm/s MV E velocity: 86.10 cm/s MV A velocity: 84.00 cm/s  SHUNTS MV E/A ratio:  1.03        Systemic VTI:  0.13 m                            Systemic Diam: 2.20 cm Sunit Tolia DO Electronically signed by Rex Kras DO Signature Date/Time: 08/27/2022/1:14:23 PM    Final    DG Chest 2 View  Result Date: 08/27/2022 CLINICAL DATA:  Chest pain EXAM: CHEST - 2 VIEW COMPARISON:  08/21/2021 FINDINGS: The heart size and mediastinal contours are within normal limits. Both lungs are clear. The visualized skeletal structures are unremarkable. IMPRESSION: No active cardiopulmonary disease. Electronically Signed   By: Ulyses Jarred M.D.   On: 08/27/2022 03:36     Assessment/Plan:  AKI/CKD stage IIIb-IV - his baseline Scr has been 1.84-2.25, however has had multiple episodes of AKI related to diuretic use.  Agree with holding furosemide for now and follow UOP and daily Scr.  No indication for dialysis at this time.  I am not sure he would want HD  because he continued to talk about quality of life over quantity of life.  Will check renal US for completeness given history of nephrolithiasis.  He is high risk for progressive CKD with IV contrast.   Avoid nephrotoxic medications including NSAIDs and iodinated intravenous contrast exposure unless the latter is absolutely indicated.   Preferred narcotic agents for pain control are hydromorphone, fentanyl, and methadone. Morphine should not be used.  Avoid Baclofen and avoid oral sodium phosphate and magnesium citrate based laxatives / bowel preps.  Continue strict Input and Output monitoring. Will monitor the patient closely with you and intervene or adjust therapy as indicated by changes in clinical status/labs   NSTEMI - Currently chest pain free.  Known CAD s/p CABG involving grafts.  Cardiology following and plan for conservative therapy for now given AKI. Chronic diastolic and systolic CHF - currently euvolemic, however EF has dropped from 40-45% in January 2023 to 25-30% today.  He has Grade I diastolic dysfunction.  Hold diuretics as above and follow. Hypokalemia - replete and follow off of furosemide. HTN - stable on metoprolol. DM type 2 - per primary   [place] 08/27/2022, 7:24 PM

## 2022-08-27 NOTE — Progress Notes (Signed)
ANTICOAGULATION CONSULT NOTE - Initial Consult  Pharmacy Consult for heparin  Indication: chest pain/ACS  Allergies  Allergen Reactions   Clonazepam Other (See Comments)    Headache    Lisinopril Other (See Comments)    headache    Patient Measurements:   Heparin Dosing Weight: 78.5kg   Vital Signs: Temp: 97.6 F (36.4 C) (10/13 0548) Temp Source: Oral (10/13 0548) BP: 131/66 (10/13 0600) Pulse Rate: 74 (10/13 0600)  Labs: Recent Labs    08/27/22 0309 08/27/22 0518  HGB 12.4*  --   HCT 36.3*  --   PLT 167  --   CREATININE 3.09*  --   TROPONINIHS 26* 59*    Estimated Creatinine Clearance: 22.6 mL/min (A) (by C-G formula based on SCr of 3.09 mg/dL (H)).   Medical History: Past Medical History:  Diagnosis Date   Anxiety    Arthritis    back and knees   Bronchitis    hx of 2015   Cancer ([place])    prostate   Cataract    immature on right   Chronic back pain    CKD (chronic kidney disease)    Constipation    takes Colace at bedtime   Coronary artery disease    takes Plavix daily but is on hold for surgery   Depression    Diabetes mellitus without complication ([facility])    takes Levemir and Humalog daily   Dizziness    Fall    hx of with injury to left shoulder    GERD (gastroesophageal reflux disease)    takes Protonix daily and Zantac   H/O urinary frequency    Hemorrhoids    History of bladder infections    History of kidney stones    Hyperlipidemia    takes Zocor every evening   Hypertension    takes Metoprolol daily   Hypothyroidism    takes Synthroid daily   Left bundle branch block    Myocardial infarction [facility])    many yrs before 2002   Night muscle spasms    takes Robaxin 4 x day    Nocturia     Medications:  (Not in a hospital admission)   Assessment: Patient presented with a CC of chest pain, left shoulder/pain. Patient not on anticoagulation at home, only Brilinta. Trop 59, pharmacy consulted to dose heparin.   Patient  currently has AKI with Scr 3.09, baseline around 2.0.   Goal of Therapy:  Heparin level 0.3-0.7 units/ml Monitor platelets by anticoagulation protocol: Yes   Plan:  Give 4000 units bolus x 1 Start heparin infusion at 900 units/hr Check anti-Xa level in 8 hours and daily while on heparin Continue to monitor H&H and platelets  Ventura Sellers 08/27/2022,7:12 AM

## 2022-08-27 NOTE — Progress Notes (Signed)
Patient transferred from ED at 1614hrs. Oriented to unit and plan of care for shift. Patient verbalized understanding.

## 2022-08-27 NOTE — ED Triage Notes (Signed)
Pt brought from home by EMS for dull pain in left shoulder/arm radiating to left chest per patient starting around 2300. Pt A&Ox4 and reports taking '324mg'$  aspirin and 3 nitro at home. Nitro helped initially but pain came back afterwards. EMS gave 1 nitro en route. Pt reports pain went from 5/10 to a 1/10 where it is currently in left shoulder/arm. None in chest at this time. Denies fever, SOB, weakness, lightheadedness/dizziness. Hx: open heart surgery with 2 MI. On blood thinner.

## 2022-08-27 NOTE — H&P (Addendum)
History and Physical    Patient: Jesus Young KCL:[id number] DOB: [birth year] DOA: 08/27/2022 DOS: the patient was seen and examined on 08/27/2022 PCP: Merrilee Seashore, MD  Patient coming from: Home via EMS  Chief Complaint:  Chief Complaint  Patient presents with   Chest Pain   HPI: Jesus Young is a 76 y.o. male with medical history significant of hypertension, hyperlipidemia, CAD s/p CABG along with history of PCI, LBBB, diabetes mellitus type 2, CKD stage IV, hypothyroidism, and primary polycythemia presents with complaints of chest pain.  Patient reported having left-sided chest pain starting late last night.  Reported having a pressure-like pain radiating into his left arm.  He had taken 1 tablet of nitroglycerin with temporary relief for 15-20 minutes.  He was getting ready for bed and was moving around when the pain returned and took his second nitroglycerin.  Again symptoms resolved only temporarily or returning for which she called EMS.  Patient reported having a brief episode of nausea and shortness of breath prior to getting in the ambulance.  Denied having any significant diaphoresis, vomiting, abdominal pain, fever, blood in stool, or dysuria symptoms.  He is followed by Dr. Posey Pronto of nephrology in the outpatient setting.  Patient notes recent changes in his diuretics.  He was switched by Dr. Posey Pronto from 80 mg to 40 mg sometime in August, but thereafter reports being switched to 80 mg twice daily.  He had been taking this and lost approximately 20 pounds over the last 2 months.  He had been told to stop Lasix altogether yesterday after initial appointment with Dr. Terri Skains.  Patient notes that he had phlebotomy done on the 6th for his polycythemia, then had blood work done when he was seen by Dr. Lindi Adie on the 9th, and reports blood work being done yesterday.  On admission into the emergency department patient was noted to have stable vital signs.  Labs significant for  hemoglobin 12.4, sodium 133, potassium 2.9, BUN 44, creatinine 3.09, glucose 327, and high-sensitivity troponin 26->59.  Chest x-ray showed no acute abnormality.  EKG without any significant ischemic changes.  Cardiology have been consulted.  Patient has been given a total of 60 mEq of potassium chloride, morphine IV, magnesium sulfate, and started on a heparin drip.    Review of Systems: As mentioned in the history of present illness. All other systems reviewed and are negative. 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   Chronic back pain    CKD (chronic kidney disease)    Constipation    takes Colace at bedtime   Coronary artery disease    takes Plavix daily but is on hold for surgery   Depression    Diabetes mellitus without complication (HCC)    takes Levemir and Humalog daily   Dizziness    Fall    hx of with injury to left shoulder    GERD (gastroesophageal reflux disease)    takes Protonix daily and Zantac   H/O urinary frequency    Hemorrhoids    History of bladder infections    History of kidney stones    Hyperlipidemia    takes Zocor every evening   Hypertension    takes Metoprolol daily   Hypothyroidism    takes Synthroid daily  Left bundle branch block    Myocardial infarction [facility])    many yrs before 2002   Night muscle spasms    takes Robaxin 4 x day    Nocturia    Past Surgical History:  Procedure Laterality Date   CARDIAC CATHETERIZATION  05/17/02   COLONOSCOPY     CORONARY ANGIOPLASTY     x 1    CORONARY ARTERY BYPASS GRAFT  2002   x 5   eyelid surgery      INGUINAL HERNIA REPAIR Left 05/06/2020   Procedure: LEFT INGUINAL HERNIA REPAIR;  Surgeon: Donnie Mesa, MD;  Location: [place];  Service: General;  Laterality: Left;   INSERTION OF MESH Left 05/06/2020   Procedure: INSERTION OF MESH;  Surgeon: Donnie Mesa, MD;   Location: [place];  Service: General;  Laterality: Left;   LEFT HEART CATH AND CORS/GRAFTS ANGIOGRAPHY N/A 12/05/2018   Procedure: LEFT HEART CATH AND CORS/GRAFTS ANGIOGRAPHY;  Surgeon: Adrian Prows, MD;  Location: [place] [facility];  Service: Cardiovascular;  Laterality: N/A;   LEFT HEART CATH AND CORS/GRAFTS ANGIOGRAPHY N/A 08/21/2021   Procedure: LEFT HEART CATH AND CORS/GRAFTS ANGIOGRAPHY;  Surgeon: Adrian Prows, MD;  Location: [place] [facility];  Service: Cardiovascular;  Laterality: N/A;   LUMBAR LAMINECTOMY/DECOMPRESSION MICRODISCECTOMY Bilateral 07/18/2013   Procedure: Bilateral Lumbar four-five Lumbar Laminotomy/Microdiskectomy;  Surgeon: Hosie Spangle, MD;  Location: [place] [facility];  Service: Neurosurgery;  Laterality: Bilateral;  Bilateral Lumbar four-five Lumbar Laminotomy/Microdiskectomy   NEPHROLITHOTOMY Left 06/23/2015   Procedure: NEPHROLITHOTOMY PERCUTANEOUS;  Surgeon: [name], MD;  Location: [facility];  Service: Urology;  Laterality: Left;   PROSTATECTOMY  10/2009   skin spots removed from back -precancerous     TONSILLECTOMY     Social History:  reports that he has never smoked. He has never used smokeless tobacco. He reports that he does not drink alcohol and does not use drugs.  Allergies  Allergen Reactions   Clonazepam Other (See Comments)    Headache    Lisinopril Other (See Comments)    headache    Family History  Problem Relation Age of Onset   Heart disease Father    Heart attack Father    Thyroid disease Sister     Prior to Admission medications   Medication Sig Start Date End Date Taking? Authorizing Provider  acetaminophen (TYLENOL) 650 MG CR tablet Take 1,300 mg by mouth in the morning and at bedtime.   Yes [provider]  aspirin EC 81 MG tablet Take 81 mg by mouth at bedtime.    Yes [provider]  busPIRone (BUSPAR) 10 MG tablet Take 20 mg by mouth 2 (two) times daily. 12/31/21  Yes [provider]  Calcium Citrate-Vitamin D  315-5 MG-MCG TABS Take 1 tablet by mouth daily at 12 noon.   Yes [provider]  famotidine (PEPCID) 40 MG tablet Take 40 mg by mouth at bedtime.   Yes [provider]  insulin detemir (LEVEMIR) 100 UNIT/ML injection Inject 0.16 mLs (16 Units total) into the skin 2 (two) times daily. Patient taking differently: Inject 20-25 Units into the skin See admin instructions. Inject 20 units into the skin in the morning and 25 units at night 10/30/15  Yes Ward, Kristen N, DO  insulin lispro (HUMALOG) 100 UNIT/ML injection Inject 0.05-0.09 mLs (5-9 Units total) into the skin 2 (two) times daily. 5 units at lunch, and 9 units after dinner. Patient taking differently: Inject 5-15 Units into the skin 2 (  two) times daily. 5-15 units as needed  (sliding scale insulin) 10/30/15  Yes Ward, Cyril Mourning N, DO  isosorbide mononitrate (IMDUR) 60 MG 24 hr tablet Take 1 tablet (60 mg total) by mouth daily. 06/09/22 10/07/22 Yes Cantwell, Celeste C, PA-C  levothyroxine (SYNTHROID) 88 MCG tablet Take 88 mcg by mouth daily. 08/20/22  Yes [provider]  meclizine (ANTIVERT) 25 MG tablet Take 25 mg by mouth 3 (three) times daily as needed (for vertigo). 03/23/22  Yes [provider]  metoprolol (LOPRESSOR) 50 MG tablet Take 50 mg by mouth 2 (two) times daily.    Yes [provider]  Multiple Vitamins-Minerals (CENTRUM SILVER) tablet Take 1 tablet by mouth daily.   Yes [provider]  nitroGLYCERIN (NITROSTAT) 0.4 MG SL tablet Place 1 tablet (0.4 mg total) under the tongue every 5 (five) minutes as needed for chest pain. 02/01/22  Yes Cantwell, Celeste C, PA-C  pantoprazole (PROTONIX) 40 MG tablet Take 40 mg by mouth at bedtime.  11/16/16  Yes [provider]  ranolazine (RANEXA) 1000 MG SR tablet Take 1 tablet (1,000 mg total) by mouth 2 (two) times daily. 05/08/22  Yes Cantwell, Celeste C, PA-C  rosuvastatin (CRESTOR) 40 MG tablet Take 40 mg by mouth at bedtime.   Yes  [provider]  ticagrelor (BRILINTA) 90 MG TABS tablet TAKE 1 TABLET(90 MG) BY MOUTH TWICE DAILY Patient taking differently: Take 90 mg by mouth 2 (two) times daily. 06/28/22  Yes Adrian Prows, MD  B-D ULTRAFINE III SHORT PEN 31G X 8 MM MISC USE UTD 01/18/17   [provider]  Docusate Sodium (GENTLE STOOL SOFTENER PO) Take 600 mg by mouth daily.    [provider]  furosemide (LASIX) 40 MG tablet Take 1 tablet (40 mg total) by mouth daily as needed for fluid or edema. Patient not taking: Reported on 08/27/2022 07/16/21 08/26/22  Alethia Berthold, PA-C  ONE TOUCH ULTRA TEST test strip  01/21/17   [provider]    Physical Exam: Vitals:   08/27/22 0253 08/27/22 0430 08/27/22 0548 08/27/22 0600  BP: 132/70 138/76  131/66  Pulse: 93 87  74  Resp: '18 15  20  '$ Temp: 97.9 F (36.6 C)  97.6 F (36.4 C)   TempSrc: Oral  Oral   SpO2: 100% 99%  90%   Constitutional: Elderly male in no acute distress Eyes: PERRL, lids and conjunctivae normal ENMT: Mucous membranes. Posterior pharynx clear of any exudate or lesions.  Neck: normal, supple, no JVD Respiratory: clear to auscultation bilaterally, no wheezing, no crackles. Normal respiratory effort.   Cardiovascular: Regular rate and rhythm, no murmurs / rubs / gallops. No extremity edema.  Abdomen: no tenderness, no masses palpated.   Bowel sounds positive.  Musculoskeletal: no clubbing / cyanosis. No joint deformity upper and lower extremities.  Skin: no rashes, lesions, ulcers. No induration Neurologic: CN 2-12 grossly intact.  Strength 5/5 in all 4.  Psychiatric: Normal judgment and insight. Alert and oriented x 3. Normal mood.   Data Reviewed:  EkG revealed a sinus rhythm 83 bpm with first-degree heart block and LBBB and QTc 557.  Labs imaging and pertinent records as noted above in the HPI.  Assessment and Plan: Chest pain elevated troponin CAD Patient presents with complaints of left-sided chest pain  with radiation into the left arm.  High-sensitivity troponins 26->59.  No significant ischemic changes appreciated.  Dr. Terri Skains of cardiology consulted.  He had a NSTEMI requiring PCI with stent  placement SVG to RCA back in 102022. -Admit to a cardiac telemetry bed -Continue to trend cardiac troponins -Heparin drip per pharmacy  -Follow up echocardiogram [facility] cardiology consulted, we will follow-up for any further recommendation  Hypokalemia Acute.  Potassium initially 2.9.  Patient has been given a total of 60 mg potassium chloride in the ED. -Continue to monitor and replace as needed  Acute kidney injury superimposed on CKD stage IIIb Creatinine 3.09 with BUN 44.  Furosemide had recently been stopped yesterday due to worsening kidney function.  Creatinine previous had been around 1.8-2.2.  Possibly related to recent diuretic dosing. -Check urinalysis -Check urine sodium, urine creatinine, urine urea -Normal saline IV fluids at 50 mL/h -Nephrology formally consulted,  will follow-up for further recommendations  Hyponatremia Acute.  Sodium 133.  Possibly related from recent diuretics which are just stopped yesterday. 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  Patient appears euvolemic on physical exam.  Last echocardiogram revealed EF of 40-45% with grade 1 diastolic dysfunction in 12/9516. -Strict I&Os Daily weights -Follow-up repeat echo  Polycythemia Acute.  Hemoglobin 12.4 g/dL, but previously been 14.3 just 4 days ago.  Patient reports multiple recent blood draws which may be cause for the recent drop and denies any reports of blood in stool. -Recheck H&H  Essential hypertension Blood pressures currently maintained.  Home medication regimen includes metoprolol 50 mg twice daily, isosorbide mononitrate 60 mg daily, Ranexa 1000 mg twice daily, and furosemide on hold since 10/11. -Continue metoprolol,  isosorbide mononitrate, and Ranexa  Diabetes mellitus type 2 with long-term use of insulin On patient glucose elevated to 327.  Last available hemoglobin A1c was 6.6 about 1-year ago.  Home insulin regimen includes Levemir 25 units every morning and 20 units nightly. -Hypoglycemic protocols -Check hemoglobin A1c -Continue current Levemir regimen -CBGs before every meal with moderate SSI  Prolonged QT interval Acute.  QTc 557 -Correct electrolyte abnormalities -Avoid QT prolonging medications  Anxiety -Continue BuSpar  Hypothyroidism -Check TSH -Continue levothyroxine  Hyperlipidemia -Continue statin    DVT prophylaxis: Heparin Advance Care Planning:   Code Status: Full Code   Consults: Cardiology  Family Communication: Wife updated at bedside  Severity of Illness: The appropriate patient status for this patient is OBSERVATION. Observation status is judged to be reasonable and necessary in order to provide the required intensity of service to ensure the patient's safety. The patient's presenting symptoms, physical exam findings, and initial radiographic and laboratory data in the context of their medical condition is felt to place them at decreased risk for further clinical deterioration. Furthermore, it is anticipated that the patient will be medically stable for discharge from the hospital within 2 midnights of admission.   Author: Norval Morton, MD 08/27/2022 7:24 AM  For on call review [email or web address].

## 2022-08-28 DIAGNOSIS — D751 Secondary polycythemia: Secondary | ICD-10-CM | POA: Diagnosis present

## 2022-08-28 DIAGNOSIS — I5042 Chronic combined systolic (congestive) and diastolic (congestive) heart failure: Secondary | ICD-10-CM | POA: Diagnosis present

## 2022-08-28 DIAGNOSIS — N179 Acute kidney failure, unspecified: Secondary | ICD-10-CM | POA: Diagnosis present

## 2022-08-28 DIAGNOSIS — Z794 Long term (current) use of insulin: Secondary | ICD-10-CM | POA: Diagnosis not present

## 2022-08-28 DIAGNOSIS — Z7902 Long term (current) use of antithrombotics/antiplatelets: Secondary | ICD-10-CM | POA: Diagnosis not present

## 2022-08-28 DIAGNOSIS — E1122 Type 2 diabetes mellitus with diabetic chronic kidney disease: Secondary | ICD-10-CM | POA: Diagnosis present

## 2022-08-28 DIAGNOSIS — I252 Old myocardial infarction: Secondary | ICD-10-CM | POA: Diagnosis not present

## 2022-08-28 DIAGNOSIS — E7800 Pure hypercholesterolemia, unspecified: Secondary | ICD-10-CM | POA: Diagnosis present

## 2022-08-28 DIAGNOSIS — E1165 Type 2 diabetes mellitus with hyperglycemia: Secondary | ICD-10-CM | POA: Diagnosis present

## 2022-08-28 DIAGNOSIS — E876 Hypokalemia: Secondary | ICD-10-CM | POA: Diagnosis present

## 2022-08-28 DIAGNOSIS — Z955 Presence of coronary angioplasty implant and graft: Secondary | ICD-10-CM | POA: Diagnosis not present

## 2022-08-28 DIAGNOSIS — I209 Angina pectoris, unspecified: Secondary | ICD-10-CM | POA: Diagnosis not present

## 2022-08-28 DIAGNOSIS — I10 Essential (primary) hypertension: Secondary | ICD-10-CM | POA: Diagnosis not present

## 2022-08-28 DIAGNOSIS — R079 Chest pain, unspecified: Secondary | ICD-10-CM | POA: Diagnosis present

## 2022-08-28 DIAGNOSIS — Z951 Presence of aortocoronary bypass graft: Secondary | ICD-10-CM | POA: Diagnosis not present

## 2022-08-28 DIAGNOSIS — I130 Hypertensive heart and chronic kidney disease with heart failure and stage 1 through stage 4 chronic kidney disease, or unspecified chronic kidney disease: Secondary | ICD-10-CM | POA: Diagnosis present

## 2022-08-28 DIAGNOSIS — K219 Gastro-esophageal reflux disease without esophagitis: Secondary | ICD-10-CM | POA: Diagnosis present

## 2022-08-28 DIAGNOSIS — F419 Anxiety disorder, unspecified: Secondary | ICD-10-CM | POA: Diagnosis present

## 2022-08-28 DIAGNOSIS — I214 Non-ST elevation (NSTEMI) myocardial infarction: Secondary | ICD-10-CM | POA: Diagnosis present

## 2022-08-28 DIAGNOSIS — Z79899 Other long term (current) drug therapy: Secondary | ICD-10-CM | POA: Diagnosis not present

## 2022-08-28 DIAGNOSIS — I25118 Atherosclerotic heart disease of native coronary artery with other forms of angina pectoris: Secondary | ICD-10-CM | POA: Diagnosis present

## 2022-08-28 DIAGNOSIS — Z8616 Personal history of COVID-19: Secondary | ICD-10-CM | POA: Diagnosis not present

## 2022-08-28 DIAGNOSIS — E039 Hypothyroidism, unspecified: Secondary | ICD-10-CM | POA: Diagnosis present

## 2022-08-28 DIAGNOSIS — N1832 Chronic kidney disease, stage 3b: Secondary | ICD-10-CM | POA: Diagnosis present

## 2022-08-28 DIAGNOSIS — E871 Hypo-osmolality and hyponatremia: Secondary | ICD-10-CM | POA: Diagnosis present

## 2022-08-28 DIAGNOSIS — Z8546 Personal history of malignant neoplasm of prostate: Secondary | ICD-10-CM | POA: Diagnosis not present

## 2022-08-28 LAB — RENAL FUNCTION PANEL
Albumin: 3.2 g/dL — ABNORMAL LOW (ref 3.5–5.0)
Anion gap: 6 (ref 5–15)
BUN: 34 mg/dL — ABNORMAL HIGH (ref 8–23)
CO2: 25 mmol/L (ref 22–32)
Calcium: 8.5 mg/dL — ABNORMAL LOW (ref 8.9–10.3)
Chloride: 103 mmol/L (ref 98–111)
Creatinine, Ser: 2.51 mg/dL — ABNORMAL HIGH (ref 0.61–1.24)
GFR, Estimated: 26 mL/min — ABNORMAL LOW (ref >60)
Glucose, Bld: 104 mg/dL — ABNORMAL HIGH (ref 70–99)
Phosphorus: 2.4 mg/dL — ABNORMAL LOW (ref 2.5–4.6)
Potassium: 3.4 mmol/L — ABNORMAL LOW (ref 3.5–5.1)
Sodium: 134 mmol/L — ABNORMAL LOW (ref 135–145)

## 2022-08-28 LAB — CBC
HCT: 36.1 % — ABNORMAL LOW (ref 39.0–52.0)
Hemoglobin: 12.2 g/dL — ABNORMAL LOW (ref 13.0–17.0)
MCH: 31.5 pg (ref 26.0–34.0)
MCHC: 33.8 g/dL (ref 30.0–36.0)
MCV: 93.3 fL (ref 80.0–100.0)
Platelets: 170 10*3/uL (ref 150–400)
RBC: 3.87 MIL/uL — ABNORMAL LOW (ref 4.22–5.81)
RDW: 13.2 % (ref 11.5–15.5)
WBC: 5.6 10*3/uL (ref 4.0–10.5)
nRBC: 0 % (ref 0.0–0.2)

## 2022-08-28 LAB — GLUCOSE, CAPILLARY
Glucose-Capillary: 169 mg/dL — ABNORMAL HIGH (ref 70–99)
Glucose-Capillary: 216 mg/dL — ABNORMAL HIGH (ref 70–99)
Glucose-Capillary: 151 mg/dL — ABNORMAL HIGH (ref 70–99)
Glucose-Capillary: 248 mg/dL — ABNORMAL HIGH (ref 70–99)

## 2022-08-28 LAB — HEPARIN LEVEL (UNFRACTIONATED)
Heparin Unfractionated: 0.22 IU/mL — ABNORMAL LOW (ref 0.30–0.70)
Heparin Unfractionated: 0.4 IU/mL (ref 0.30–0.70)

## 2022-08-28 LAB — UREA NITROGEN, URINE: Urea Nitrogen, Ur: 541 mg/dL

## 2022-08-28 LAB — TROPONIN I (HIGH SENSITIVITY): Troponin I (High Sensitivity): 56 ng/L — ABNORMAL HIGH (ref <18)

## 2022-08-28 MED ORDER — NITROGLYCERIN 0.4 MG SL SUBL
SUBLINGUAL_TABLET | SUBLINGUAL | Status: AC
  Administered 2022-08-28: 0.4 mg
  Filled 2022-08-28: qty 1

## 2022-08-28 MED ORDER — FAMOTIDINE 20 MG PO TABS
20.0000 mg | ORAL_TABLET | Freq: Every day | ORAL | Status: DC
  Administered 2022-08-28: 20 mg via ORAL
  Filled 2022-08-28: qty 1

## 2022-08-28 MED ORDER — POTASSIUM CHLORIDE CRYS ER 20 MEQ PO TBCR
20.0000 meq | EXTENDED_RELEASE_TABLET | Freq: Once | ORAL | Status: AC
  Administered 2022-08-28: 20 meq via ORAL
  Filled 2022-08-28: qty 1

## 2022-08-28 MED ORDER — RANOLAZINE ER 500 MG PO TB12
500.0000 mg | ORAL_TABLET | Freq: Two times a day (BID) | ORAL | Status: DC
  Administered 2022-08-28 – 2022-08-29 (×2): 500 mg via ORAL
  Filled 2022-08-28 (×2): qty 1

## 2022-08-28 MED ORDER — INSULIN DETEMIR 100 UNIT/ML ~~LOC~~ SOLN
18.0000 [IU] | Freq: Every morning | SUBCUTANEOUS | Status: DC
  Administered 2022-08-28: 18 [IU] via SUBCUTANEOUS
  Filled 2022-08-28 (×2): qty 0.18

## 2022-08-28 NOTE — Care Management Obs Status (Signed)
[place] NOTIFICATION   Patient Details  Name: Jesus Young MRN: [id number] Date of Birth: 1946/04/04   Medicare Observation Status Notification Given:  Yes    Jesus Couchman G., RN 08/28/2022, 10:10 AM

## 2022-08-28 NOTE — Progress Notes (Signed)
ANTICOAGULATION CONSULT NOTE-Follow Up  Pharmacy Consult for heparin  Indication: chest pain/ACS  Allergies  Allergen Reactions   Clonazepam Other (See Comments)    Headache    Lisinopril Other (See Comments)    headache    Patient Measurements: Height: '6\' 3"'$  (190.5 cm) Weight: 78.7 kg (173 lb 8 oz) IBW/kg (Calculated) : 84.5 Heparin Dosing Weight: 78.5kg   Vital Signs: Temp: 97.7 F (36.5 C) (10/14 0733) Temp Source: Axillary (10/14 0733) BP: 125/71 (10/14 1039) Pulse Rate: 83 (10/14 1039)  Labs: Recent Labs    08/27/22 0309 08/27/22 0518 08/27/22 0819 08/27/22 1656 08/28/22 0642 08/28/22 1638  HGB 12.4*  --  11.9*  --  12.2*  --   HCT 36.3*  --  35.0*  --  36.1*  --   PLT 167  --   --   --  170  --   HEPARINUNFRC  --   --   --  0.26* 0.22* 0.40  CREATININE 3.09*  --   --   --  2.51*  --   TROPONINIHS 26* 59* 107*  --  56*  --      Estimated Creatinine Clearance: 27.9 mL/min (A) (by C-G formula based on SCr of 2.51 mg/dL (H)).   Assessment: Patient presented with a CC of chest pain, left shoulder/pain. Patient not on anticoagulation at home, only Brilinta. Trop 59, pharmacy consulted to dose heparin.   Heparin level slightly subtherapeutic 0.4 on heparin at 1200 units/hr. Hgb ~12 and plts WNL. No issues with line noted.   Goal of Therapy:  Heparin level 0.3-0.7 units/ml Monitor platelets by anticoagulation protocol: Yes   Plan:  Continue heparin infusion at 1200 units/hr Monitor CBC and for signs/symptoms of bleed daily  Erin Hearing PharmD., BCPS Clinical Pharmacist 08/28/2022 5:42 PM

## 2022-08-28 NOTE — Progress Notes (Signed)
[name], male   DOB: 1945/12/08, 76 y.o.   MRN: [id number] S: Feels well but did have a short episode of chest pain this morning. O:BP 125/71   Pulse 83   Temp 97.7 F (36.5 C) (Axillary)   Resp 16   Ht '6\' 3"'$  (1.905 m)   Wt 78.7 kg   SpO2 100%   BMI 21.69 kg/m   Intake/Output Summary (Last 24 hours) at 08/28/2022 1157 Last data filed at 08/28/2022 0737 Gross per 24 hour  Intake 1044.73 ml  Output 2200 ml  Net -1155.27 ml   Intake/Output: I/O last 3 completed shifts: In: 1294.7 [P.O.:240; I.V.:804.7; IV Piggyback:250] Out: 1800 [Urine:1800]  Intake/Output this shift:  Total I/O In: -  Out: 400 [Urine:400] Weight change:  Gen: NAD CVS: RRR Resp:CTA Abd: +BS, soft,NT/ND Ext: no edema  Recent Labs  Lab 08/23/22 1035 08/27/22 0309 08/28/22 0642  NA 133* 133* 134*  K 3.3* 2.9* 3.4*  CL 92* 93* 103  CO2 32 26 25  GLUCOSE 202* 327* 104*  BUN 47* 44* 34*  CREATININE 2.97* 3.09* 2.51*  ALBUMIN 4.3  --  3.2*  CALCIUM 9.3 8.7* 8.5*  PHOS  --   --  2.4*  AST 22  --   --   ALT 22  --   --    Liver Function Tests: Recent Labs  Lab 08/23/22 1035 08/28/22 0642  AST 22  --   ALT 22  --   ALKPHOS 41  --   BILITOT 0.7  --   PROT 7.4  --   ALBUMIN 4.3 3.2*   No results for input(s): "LIPASE", "AMYLASE" in the last 168 hours. No results for input(s): "AMMONIA" in the last 168 hours. CBC: Recent Labs  Lab 08/23/22 1035 08/27/22 0309 08/27/22 0819 08/28/22 0642  WBC 5.9 5.4  --  5.6  NEUTROABS 4.3  --   --   --   HGB 14.3 12.4* 11.9* 12.2*  HCT 41.7 36.3* 35.0* 36.1*  MCV 92.7 93.3  --  93.3  PLT 199 167  --  170   Cardiac Enzymes: No results for input(s): "CKTOTAL", "CKMB", "CKMBINDEX", "TROPONINI" in the last 168 hours. CBG: Recent Labs  Lab 08/27/22 0814 08/27/22 1240 08/27/22 2235 08/28/22 1119  GLUCAP 200* 73 237* 169*    Iron Studies: No results for input(s): "IRON", "TIBC", "TRANSFERRIN", "FERRITIN" in the last 72  hours. Studies/Results: US RENAL  Result Date: 08/27/2022 CLINICAL DATA:  AKI. EXAM: RENAL / URINARY TRACT ULTRASOUND COMPLETE COMPARISON:  10/23/2021. FINDINGS: Right Kidney: Renal measurements: 9.0 x 4.5 x 4.8 cm = volume: 101.9 mL. Echogenicity within normal limits. No mass or hydronephrosis visualized. Left Kidney: Renal measurements: 10.0 x 4.8 x 6.6 cm = volume: 165.8 mL. Echogenicity within normal limits. A cyst with a thin septation is noted in the upper pole of the left kidney measuring 1.8 x 1.6 x 1.6 cm. No mass or hydronephrosis visualized. Bladder: Appears normal for degree of bladder distention. Bilateral ureteral jets are noted Other: None. IMPRESSION: 1. Septated cyst in the upper pole of the left kidney, slightly increased in size from the prior exam. 2. Otherwise normal exam. Electronically Signed   By: Brett Fairy M.D.   On: 08/27/2022 21:40   ECHOCARDIOGRAM COMPLETE  Result Date: 08/27/2022    ECHOCARDIOGRAM REPORT   Patient Name:   Jesus Young Date of Exam: 08/27/2022 Medical Rec #:  [id number]  Height:       74.0 in Accession #:    [id number]         Weight:       173.0 lb Date of Birth:  1946-01-04          BSA:          2.044 m Patient Age:    65 years           BP:           136/75 mmHg Patient Gender: M                  HR:           117 bpm. Exam Location:  Inpatient Procedure: 2D Echo, Cardiac Doppler, Color Doppler and Intracardiac            Opacification Agent Indications:     Chest Pain R07.9  History:         Patient has prior history of Echocardiogram examinations, most                  recent 11/26/2021. CHF, CAD and NSTEMI, Signs/Symptoms:Chest                  Pain and Dyspnea; Risk Factors:Diabetes, Hypertension and                  Dyslipidemia.  Sonographer:     Greer Pickerel Referring Phys:  [id number] Rex Kras Diagnosing Phys: Rex Kras DO  Sonographer Comments: Image acquisition challenging due to respiratory motion. IMPRESSIONS  1. Left  ventricular ejection fraction, by estimation, is 25 to 30%. The left ventricle has severely decreased function. The left ventricle demonstrates regional wall motion abnormalities (see scoring diagram/findings for description). The left ventricular internal cavity size was dilated. Left ventricular diastolic parameters are consistent with Grade I diastolic dysfunction (impaired relaxation).  2. Right ventricular systolic function is mildly reduced. The right ventricular size is normal. There is normal pulmonary artery systolic pressure.  3. A small pericardial effusion is present.  4. The mitral valve is grossly normal. Mild mitral valve regurgitation.  5. The aortic valve is tricuspid. Aortic valve regurgitation is not visualized. No aortic stenosis is present.  6. The inferior vena cava is normal in size with greater than 50% respiratory variability, suggesting right atrial pressure of 3 mmHg. Comparison(s): Prior study 11/26/2021: LVEF 40-45%, mildly dilated LV cavity, G1DD, mild MR, mild PR, RWMA (basal anteroseptal, basal anterior, basal inferoseptal, mid anteroseptal, mid anterior, mid inferoseptal, apical anterior). Conclusion(s)/Recommendation(s): No left ventricular mural or apical thrombus/thrombi. FINDINGS  Left Ventricle: Left ventricular ejection fraction, by estimation, is 25 to 30%. The left ventricle has severely decreased function. The left ventricle demonstrates regional wall motion abnormalities. Definity contrast agent was given IV to delineate the left ventricular endocardial borders. The left ventricular internal cavity size was dilated. There is no left ventricular hypertrophy. Left ventricular diastolic parameters are consistent with Grade I diastolic dysfunction (impaired relaxation).  LV Wall Scoring: The entire anterior wall, entire septum, and apex are hypokinetic. Right Ventricle: The right ventricular size is normal. No increase in right ventricular wall thickness. Right ventricular  systolic function is mildly reduced. There is normal pulmonary artery systolic pressure. The tricuspid regurgitant velocity is 0.74 m/s, and with an assumed right atrial pressure of 3 mmHg, the estimated right ventricular systolic pressure is 5.2 mmHg. Left Atrium: Left atrial size was normal in size. Right Atrium: Right atrial size was normal in  size. Pericardium: A small pericardial effusion is present. Mitral Valve: The mitral valve is grossly normal. Mild mitral valve regurgitation. Tricuspid Valve: The tricuspid valve is grossly normal. Tricuspid valve regurgitation is trivial. No evidence of tricuspid stenosis. Aortic Valve: The aortic valve is tricuspid. Aortic valve regurgitation is not visualized. No aortic stenosis is present. Aortic valve mean gradient measures 2.0 mmHg. Aortic valve peak gradient measures 2.9 mmHg. Aortic valve area, by VTI measures 2.88 cm. Pulmonic Valve: The pulmonic valve was grossly normal. Pulmonic valve regurgitation is not visualized. No evidence of pulmonic stenosis. Aorta: The aortic root and ascending aorta are structurally normal, with no evidence of dilitation. Venous: The inferior vena cava is normal in size with greater than 50% respiratory variability, suggesting right atrial pressure of 3 mmHg. IAS/Shunts: The interatrial septum was not well visualized. Additional Comments: There is pleural effusion in the left lateral region.  LEFT VENTRICLE PLAX 2D LVIDd:         5.40 cm      Diastology LVIDs:         4.50 cm      LV e' medial:    3.26 cm/s LV PW:         1.10 cm      LV E/e' medial:  26.4 LV IVS:        1.00 cm      LV e' lateral:   8.27 cm/s LVOT diam:     2.20 cm      LV E/e' lateral: 10.4 LV SV:         50 LV SV Index:   25 LVOT Area:     3.80 cm  LV Volumes (MOD) LV vol d, MOD A2C: 247.0 ml LV vol d, MOD A4C: 226.0 ml LV vol s, MOD A2C: 189.0 ml LV vol s, MOD A4C: 186.0 ml LV SV MOD A2C:     58.0 ml LV SV MOD A4C:     226.0 ml LV SV MOD BP:      43.5 ml RIGHT  VENTRICLE RV S prime:     8.51 cm/s TAPSE (M-mode): 1.2 cm LEFT ATRIUM             Index        RIGHT ATRIUM           Index LA diam:        4.20 cm 2.06 cm/m   RA Area:     15.20 cm LA Vol (A2C):   47.1 ml 23.05 ml/m  RA Volume:   34.40 ml  16.83 ml/m LA Vol (A4C):   63.4 ml 31.02 ml/m LA Biplane Vol: 56.7 ml 27.75 ml/m  AORTIC VALVE                    PULMONIC VALVE AV Area (Vmax):    2.93 cm     PR End Diast Vel: 2.81 msec AV Area (Vmean):   2.41 cm AV Area (VTI):     2.88 cm AV Vmax:           84.80 cm/s AV Vmean:          67.000 cm/s AV VTI:            0.174 m AV Peak Grad:      2.9 mmHg AV Mean Grad:      2.0 mmHg LVOT Vmax:         65.30 cm/s LVOT Vmean:        42.500  cm/s LVOT VTI:          0.132 m LVOT/AV VTI ratio: 0.76  AORTA Ao Root diam: 3.60 cm Ao Asc diam:  3.40 cm MITRAL VALVE               TRICUSPID VALVE MV Area (PHT): 5.23 cm    TR Peak grad:   2.2 mmHg MV Decel Time: 145 msec    TR Vmax:        74.50 cm/s MV E velocity: 86.10 cm/s MV A velocity: 84.00 cm/s  SHUNTS MV E/A ratio:  1.03        Systemic VTI:  0.13 m                            Systemic Diam: 2.20 cm Sunit Tolia DO Electronically signed by Rex Kras DO Signature Date/Time: 08/27/2022/1:14:23 PM    Final    DG Chest 2 View  Result Date: 08/27/2022 CLINICAL DATA:  Chest pain EXAM: CHEST - 2 VIEW COMPARISON:  08/21/2021 FINDINGS: The heart size and mediastinal contours are within normal limits. Both lungs are clear. The visualized skeletal structures are unremarkable. IMPRESSION: No active cardiopulmonary disease. Electronically Signed   By: Ulyses Jarred M.D.   On: 08/27/2022 03:36    aspirin EC  81 mg Oral QHS   busPIRone  20 mg Oral BID   famotidine  40 mg Oral QHS   insulin aspart  0-15 Units Subcutaneous TID WC   insulin detemir  18 Units Subcutaneous q AM   insulin detemir  20 Units Subcutaneous QPM   isosorbide mononitrate  60 mg Oral Daily   levothyroxine  88 mcg Oral Daily   metoprolol tartrate  50 mg  Oral BID   ranolazine  1,000 mg Oral BID   rosuvastatin  40 mg Oral QHS   sodium chloride flush  3 mL Intravenous Q12H   ticagrelor  90 mg Oral BID    BMET    Component Value Date/Time   NA 134 (L) 08/28/2022 0642   NA 141 01/28/2022 1500   K 3.4 (L) 08/28/2022 0642   CL 103 08/28/2022 0642   CO2 25 08/28/2022 0642   GLUCOSE 104 (H) 08/28/2022 0642   BUN 34 (H) 08/28/2022 0642   BUN 26 01/28/2022 1500   CREATININE 2.51 (H) 08/28/2022 0642   CREATININE 2.97 (H) 08/23/2022 1035   CALCIUM 8.5 (L) 08/28/2022 0642   GFRNONAA 26 (L) 08/28/2022 0642   GFRNONAA 21 (L) 08/23/2022 1035   GFRAA 43 (L) 04/30/2020 1100   CBC    Component Value Date/Time   WBC 5.6 08/28/2022 0642   RBC 3.87 (L) 08/28/2022 0642   HGB 12.2 (L) 08/28/2022 0642   HGB 14.3 08/23/2022 1035   HCT 36.1 (L) 08/28/2022 0642   PLT 170 08/28/2022 0642   PLT 199 08/23/2022 1035   MCV 93.3 08/28/2022 0642   MCH 31.5 08/28/2022 0642   MCHC 33.8 08/28/2022 0642   RDW 13.2 08/28/2022 0642   LYMPHSABS 0.6 (L) 08/23/2022 1035   MONOABS 0.7 08/23/2022 1035   EOSABS 0.3 08/23/2022 1035   BASOSABS 0.0 08/23/2022 1035    Assessment/Plan:  AKI/CKD stage IIIb-IV - his baseline Scr has been 1.84-2.25, however has had multiple episodes of AKI related to diuretic use.  Agree with holding furosemide for now as Scr has already improved to 2.51 overnight.  Continue to follow BUN/Cr and UOP.  No indication for dialysis  at this time.  We discussed different modalities of RRT including in-center HD, home HD, and PD.  He would be amenable for dialysis if needed.  Renal US negative for obstruction.  He is high risk for progressive CKD with IV contrast and understands.     Avoid nephrotoxic medications including NSAIDs and iodinated intravenous contrast exposure unless the latter is absolutely indicated.   Preferred narcotic agents for pain control are hydromorphone, fentanyl, and methadone. Morphine should not be used.  Avoid  Baclofen and avoid oral sodium phosphate and magnesium citrate based laxatives / bowel preps.  Continue strict Input and Output monitoring. Will monitor the patient closely with you and intervene or adjust therapy as indicated by changes in clinical status/labs   NSTEMI - Currently chest pain free.  Known CAD s/p CABG involving grafts.  Cardiology following and plan for conservative therapy for now given AKI.  If heart cath is required, would wait until Scr gets below 2 and use IV NS protocol to help lower risk of CIN.  He would be willing to proceed with HD if needed following heart cath.  Chronic diastolic and systolic CHF - currently euvolemic, however EF has dropped from 40-45% in January 2023 to 25-30% today.  He has Grade I diastolic dysfunction.  Hold diuretics as above and follow. Hypokalemia - replete and follow off of furosemide. HTN - stable on metoprolol. DM type 2 - per primary  Donetta Potts, MD [facility] [phone]

## 2022-08-28 NOTE — Progress Notes (Signed)
Progress Note  Patient: Jesus Young ZWC:[id number] DOB: [birth year]  DOA: 08/27/2022  DOS: 08/28/2022    Brief hospital course: HPI: Jesus Young is a 76 y.o. male with medical history significant of hypertension, hyperlipidemia, CAD s/p CABG along with history of PCI, LBBB, diabetes mellitus type 2, CKD stage IV, hypothyroidism, and primary polycythemia presents with complaints of chest pain.  Patient reported having left-sided chest pain starting late last night.  Reported having a pressure-like pain radiating into his left arm.  He had taken 1 tablet of nitroglycerin with temporary relief for 15-20 minutes.  He was getting ready for bed and was moving around when the pain returned and took his second nitroglycerin.  Again symptoms resolved only temporarily or returning for which she called EMS.  Patient reported having a brief episode of nausea and shortness of breath prior to getting in the ambulance.  Denied having any significant diaphoresis, vomiting, abdominal pain, fever, blood in stool, or dysuria symptoms.  He is followed by Dr. Posey Pronto of nephrology in the outpatient setting.  Patient notes recent changes in his diuretics.  He was switched by Dr. Posey Pronto from 80 mg to 40 mg sometime in August, but thereafter reports being switched to 80 mg twice daily.  He had been taking this and lost approximately 20 pounds over the last 2 months.  He had been told to stop Lasix altogether yesterday after initial appointment with Dr. Terri Skains.  Patient notes that he had phlebotomy done on the 6th for his polycythemia, then had blood work done when he was seen by Dr. Lindi Adie on the 9th, and reports blood work being done yesterday.   On admission into the emergency department patient was noted to have stable vital signs.  Labs significant for hemoglobin 12.4, sodium 133, potassium 2.9, BUN 44, creatinine 3.09, glucose 327, and high-sensitivity troponin 26->59.  Chest x-ray showed no acute abnormality.  EKG  without any significant ischemic changes.  Cardiology have been consulted.  Patient has been given a total of 60 mEq of potassium chloride, morphine IV, magnesium sulfate, and started on a heparin drip.  Assessment and Plan: NSTEMI:  - Appreciate cardiology recommendations. We are continuing DAPT with ASA and brilinta, heparin gtt. The patient is having some discomfort that may be anginal. Defer to them.  - Continue metoprolol, likely to change to succinate prior to discharge. Continue imdur and ranexa (decrease dose due to renal insufficiency). - Continue high intensity statin       Acute kidney injury superimposed on CKD stage IIIb: May be progressing to stage IV CKD.  - Nephrology assistance appreciated.  - US renal unremarkable, improvement is consistent with overdiuresis PTA, will continue monitoring. - Continue gentle NS and monitoring closely.  - Minimize nephrotoxins. With severity of echocardiogram findings, ongoing angina, anticipate he will require cath. Nephrology has discussed with him the risk of CIN and patient would accept that risk     Chronic combined HFrEF with worsening LVEF to 25-30% and regional wall motion abnormalities on echo 10/13.  - Holding GDMT due to AKI, though on beta blocker. Holding diuretic for now.   - Monitor volume status closely  Hypokalemia: Improved with supplementation.  - Will repeat modest supplementation.   Polycythemia vera: Regular RBC donations. - Monitor while on heparin gtt. No active bleeding   HTN:  - Continue metoprolol '50mg'$  BID, imdur '60mg'$  - Continue metoprolol, isosorbide mononitrate, and Ranexa   Hyponatremia: Mild, improving.  - Fluid restriction  IDT2DM: Followed for  years by Dr. Chalmers Cater.  - HbA1c historically 6.6%, now 7.7%. He takes an active interest in management. We will change levemir this AM to 18u and continue scheduled 20u qPM dose with mod SSI.    Prolonged QT interval: QTc 557 - Correct electrolyte abnormalities -  Avoid QT prolonging medications  Hypophosphatemia: 2.4.  - Recheck in AM.  GERD:  - Continue pepcid, decrease dose with renal insufficiency   Anxiety - Continue buspar   Hypothyroidism: TSH 2.142 - Continue synthroid home dose   Hyperlipidemia - Continue statin  Subjective: Felt modest aching in left arm earlier that came and went, no current chest pain. No swelling in legs. CBG is in 90's and he requests lower levemir dose based on his anticipate po intake. Also reports recent history of spasmodic pain in right lower abdomen/pelvis which is not currently bothering him. No hematuria or other bleeding.   Objective: Vitals:   08/27/22 2235 08/28/22 0416 08/28/22 0733 08/28/22 1039  BP: 121/63 127/68 126/65 125/71  Pulse: 77 77 85 83  Resp: '19 16 16   '$ Temp: 97.6 F (36.4 C) 98.2 F (36.8 C) 97.7 F (36.5 C)   TempSrc: Oral Oral Axillary   SpO2: 97% 98% 100%   Weight:      Height:       Gen: Pleasant, lively older male in no distress Pulm: Nonlabored breathing room air. Clear CV: Regular rate and rhythm. No murmur, rub, or gallop. No JVD, no dependent edema. GI: Abdomen soft, non-tender, non-distended, with normoactive bowel sounds.  Ext: Warm, no deformities Skin: No rashes, lesions or ulcers on visualized skin. Neuro: Alert and oriented. No focal neurological deficits. Psych: Judgement and insight appear fair. Mood euthymic & affect congruent. Behavior is appropriate.    Data Personally reviewed: CBC: Recent Labs  Lab 08/23/22 1035 08/27/22 0309 08/27/22 0819 08/28/22 0642  WBC 5.9 5.4  --  5.6  NEUTROABS 4.3  --   --   --   HGB 14.3 12.4* 11.9* 12.2*  HCT 41.7 36.3* 35.0* 36.1*  MCV 92.7 93.3  --  93.3  PLT 199 167  --  767   Basic Metabolic Panel: Recent Labs  Lab 08/23/22 1035 08/27/22 0309 08/27/22 0518 08/28/22 0642  NA 133* 133*  --  134*  K 3.3* 2.9*  --  3.4*  CL 92* 93*  --  103  CO2 32 26  --  25  GLUCOSE 202* 327*  --  104*  BUN 47* 44*   --  34*  CREATININE 2.97* 3.09*  --  2.51*  CALCIUM 9.3 8.7*  --  8.5*  MG  --   --  1.9  --   PHOS  --   --   --  2.4*   GFR: Estimated Creatinine Clearance: 27.9 mL/min (A) (by C-G formula based on SCr of 2.51 mg/dL (H)). Liver Function Tests: Recent Labs  Lab 08/23/22 1035 08/28/22 0642  AST 22  --   ALT 22  --   ALKPHOS 41  --   BILITOT 0.7  --   PROT 7.4  --   ALBUMIN 4.3 3.2*   No results for input(s): "LIPASE", "AMYLASE" in the last 168 hours. No results for input(s): "AMMONIA" in the last 168 hours. Coagulation Profile: No results for input(s): "INR", "PROTIME" in the last 168 hours. Cardiac Enzymes: No results for input(s): "CKTOTAL", "CKMB", "CKMBINDEX", "TROPONINI" in the last 168 hours. BNP (last 3 results) No results for input(s): "PROBNP" in the last  8760 hours. HbA1C: Recent Labs    08/27/22 0819  HGBA1C 7.7*   CBG: Recent Labs  Lab 08/27/22 0814 08/27/22 1240 08/27/22 2235 08/28/22 1119 08/28/22 1628  GLUCAP 200* 73 237* 169* 216*   Lipid Profile: No results for input(s): "CHOL", "HDL", "LDLCALC", "TRIG", "CHOLHDL", "LDLDIRECT" in the last 72 hours. Thyroid Function Tests: Recent Labs    08/27/22 0819  TSH 2.142   Anemia Panel: No results for input(s): "VITAMINB12", "FOLATE", "FERRITIN", "TIBC", "IRON", "RETICCTPCT" in the last 72 hours. Urine analysis:    Component Value Date/Time   COLORURINE YELLOW 08/27/2022 [place] 08/27/2022 1326   LABSPEC 1.012 08/27/2022 1326   PHURINE 5.0 08/27/2022 1326   GLUCOSEU 50 (A) 08/27/2022 1326   HGBUR NEGATIVE 08/27/2022 1326   [place] 08/27/2022 1326   KETONESUR NEGATIVE 08/27/2022 1326   PROTEINUR 30 (A) 08/27/2022 1326   UROBILINOGEN 0.2 07/23/2015 0052   NITRITE NEGATIVE 08/27/2022 1326   LEUKOCYTESUR NEGATIVE 08/27/2022 1326   No results found for this or any previous visit (from the past 240 hour(s)).   US RENAL  Result Date: 08/27/2022 CLINICAL DATA:   AKI. EXAM: RENAL / URINARY TRACT ULTRASOUND COMPLETE COMPARISON:  10/23/2021. FINDINGS: Right Kidney: Renal measurements: 9.0 x 4.5 x 4.8 cm = volume: 101.9 mL. Echogenicity within normal limits. No mass or hydronephrosis visualized. Left Kidney: Renal measurements: 10.0 x 4.8 x 6.6 cm = volume: 165.8 mL. Echogenicity within normal limits. A cyst with a thin septation is noted in the upper pole of the left kidney measuring 1.8 x 1.6 x 1.6 cm. No mass or hydronephrosis visualized. Bladder: Appears normal for degree of bladder distention. Bilateral ureteral jets are noted Other: None. IMPRESSION: 1. Septated cyst in the upper pole of the left kidney, slightly increased in size from the prior exam. 2. Otherwise normal exam. Electronically Signed   By: Brett Fairy M.D.   On: 08/27/2022 21:40   ECHOCARDIOGRAM COMPLETE  Result Date: 08/27/2022    ECHOCARDIOGRAM REPORT   Patient Name:   EZEKIEL MENZER Date of Exam: 08/27/2022 Medical Rec #:  [id number]          Height:       74.0 in Accession #:    [id number]         Weight:       173.0 lb Date of Birth:  Aug 09, 1946          BSA:          2.044 m Patient Age:    54 years           BP:           136/75 mmHg Patient Gender: M                  HR:           117 bpm. Exam Location:  Inpatient Procedure: 2D Echo, Cardiac Doppler, Color Doppler and Intracardiac            Opacification Agent Indications:     Chest Pain R07.9  History:         Patient has prior history of Echocardiogram examinations, most                  recent 11/26/2021. CHF, CAD and NSTEMI, Signs/Symptoms:Chest                  Pain and Dyspnea; Risk Factors:Diabetes, Hypertension and  Dyslipidemia.  Sonographer:     Greer Pickerel Referring Phys:  [id number] Rex Kras Diagnosing Phys: Rex Kras DO  Sonographer Comments: Image acquisition challenging due to respiratory motion. IMPRESSIONS  1. Left ventricular ejection fraction, by estimation, is 25 to 30%. The left ventricle has  severely decreased function. The left ventricle demonstrates regional wall motion abnormalities (see scoring diagram/findings for description). The left ventricular internal cavity size was dilated. Left ventricular diastolic parameters are consistent with Grade I diastolic dysfunction (impaired relaxation).  2. Right ventricular systolic function is mildly reduced. The right ventricular size is normal. There is normal pulmonary artery systolic pressure.  3. A small pericardial effusion is present.  4. The mitral valve is grossly normal. Mild mitral valve regurgitation.  5. The aortic valve is tricuspid. Aortic valve regurgitation is not visualized. No aortic stenosis is present.  6. The inferior vena cava is normal in size with greater than 50% respiratory variability, suggesting right atrial pressure of 3 mmHg. Comparison(s): Prior study 11/26/2021: LVEF 40-45%, mildly dilated LV cavity, G1DD, mild MR, mild PR, RWMA (basal anteroseptal, basal anterior, basal inferoseptal, mid anteroseptal, mid anterior, mid inferoseptal, apical anterior). Conclusion(s)/Recommendation(s): No left ventricular mural or apical thrombus/thrombi. FINDINGS  Left Ventricle: Left ventricular ejection fraction, by estimation, is 25 to 30%. The left ventricle has severely decreased function. The left ventricle demonstrates regional wall motion abnormalities. Definity contrast agent was given IV to delineate the left ventricular endocardial borders. The left ventricular internal cavity size was dilated. There is no left ventricular hypertrophy. Left ventricular diastolic parameters are consistent with Grade I diastolic dysfunction (impaired relaxation).  LV Wall Scoring: The entire anterior wall, entire septum, and apex are hypokinetic. Right Ventricle: The right ventricular size is normal. No increase in right ventricular wall thickness. Right ventricular systolic function is mildly reduced. There is normal pulmonary artery systolic  pressure. The tricuspid regurgitant velocity is 0.74 m/s, and with an assumed right atrial pressure of 3 mmHg, the estimated right ventricular systolic pressure is 5.2 mmHg. Left Atrium: Left atrial size was normal in size. Right Atrium: Right atrial size was normal in size. Pericardium: A small pericardial effusion is present. Mitral Valve: The mitral valve is grossly normal. Mild mitral valve regurgitation. Tricuspid Valve: The tricuspid valve is grossly normal. Tricuspid valve regurgitation is trivial. No evidence of tricuspid stenosis. Aortic Valve: The aortic valve is tricuspid. Aortic valve regurgitation is not visualized. No aortic stenosis is present. Aortic valve mean gradient measures 2.0 mmHg. Aortic valve peak gradient measures 2.9 mmHg. Aortic valve area, by VTI measures 2.88 cm. Pulmonic Valve: The pulmonic valve was grossly normal. Pulmonic valve regurgitation is not visualized. No evidence of pulmonic stenosis. Aorta: The aortic root and ascending aorta are structurally normal, with no evidence of dilitation. Venous: The inferior vena cava is normal in size with greater than 50% respiratory variability, suggesting right atrial pressure of 3 mmHg. IAS/Shunts: The interatrial septum was not well visualized. Additional Comments: There is pleural effusion in the left lateral region.  LEFT VENTRICLE PLAX 2D LVIDd:         5.40 cm      Diastology LVIDs:         4.50 cm      LV e' medial:    3.26 cm/s LV PW:         1.10 cm      LV E/e' medial:  26.4 LV IVS:        1.00 cm      LV  e' lateral:   8.27 cm/s LVOT diam:     2.20 cm      LV E/e' lateral: 10.4 LV SV:         50 LV SV Index:   25 LVOT Area:     3.80 cm  LV Volumes (MOD) LV vol d, MOD A2C: 247.0 ml LV vol d, MOD A4C: 226.0 ml LV vol s, MOD A2C: 189.0 ml LV vol s, MOD A4C: 186.0 ml LV SV MOD A2C:     58.0 ml LV SV MOD A4C:     226.0 ml LV SV MOD BP:      43.5 ml RIGHT VENTRICLE RV S prime:     8.51 cm/s TAPSE (M-mode): 1.2 cm LEFT ATRIUM              Index        RIGHT ATRIUM           Index LA diam:        4.20 cm 2.06 cm/m   RA Area:     15.20 cm LA Vol (A2C):   47.1 ml 23.05 ml/m  RA Volume:   34.40 ml  16.83 ml/m LA Vol (A4C):   63.4 ml 31.02 ml/m LA Biplane Vol: 56.7 ml 27.75 ml/m  AORTIC VALVE                    PULMONIC VALVE AV Area (Vmax):    2.93 cm     PR End Diast Vel: 2.81 msec AV Area (Vmean):   2.41 cm AV Area (VTI):     2.88 cm AV Vmax:           84.80 cm/s AV Vmean:          67.000 cm/s AV VTI:            0.174 m AV Peak Grad:      2.9 mmHg AV Mean Grad:      2.0 mmHg LVOT Vmax:         65.30 cm/s LVOT Vmean:        42.500 cm/s LVOT VTI:          0.132 m LVOT/AV VTI ratio: 0.76  AORTA Ao Root diam: 3.60 cm Ao Asc diam:  3.40 cm MITRAL VALVE               TRICUSPID VALVE MV Area (PHT): 5.23 cm    TR Peak grad:   2.2 mmHg MV Decel Time: 145 msec    TR Vmax:        74.50 cm/s MV E velocity: 86.10 cm/s MV A velocity: 84.00 cm/s  SHUNTS MV E/A ratio:  1.03        Systemic VTI:  0.13 m                            Systemic Diam: 2.20 cm Sunit Tolia DO Electronically signed by Rex Kras DO Signature Date/Time: 08/27/2022/1:14:23 PM    Final    DG Chest 2 View  Result Date: 08/27/2022 CLINICAL DATA:  Chest pain EXAM: CHEST - 2 VIEW COMPARISON:  08/21/2021 FINDINGS: The heart size and mediastinal contours are within normal limits. Both lungs are clear. The visualized skeletal structures are unremarkable. IMPRESSION: No active cardiopulmonary disease. Electronically Signed   By: Ulyses Jarred M.D.   On: 08/27/2022 03:36    Family Communication: None at bedside  Disposition: Status is: Inpatient Remains  inpatient appropriate because: Ongoing heparin IV, renal function monitoring Planned Discharge Destination: Home      Patrecia Pour, MD 08/28/2022 6:28 PM Page by [email or web address]

## 2022-08-28 NOTE — Progress Notes (Signed)
2210: Pt c/o left chest/shoulder/arm pain 3/10.  States it occurred while he was sitting on the side of the bed adjusting his blankets and getting ready for bed.  NTG SL 1 tab given per pt request.  EKG shows no acute changes.  VSS. 2215: Pt states the "cardiac pain" has resolved, but he still has some "residual pain" 1/10 in the left shoulder joint.  States he may have arthritis.  Tylenol offered, but pt declined.  Denies any other needs at this time.  Will continue to monitor.  Jodell Cipro

## 2022-08-28 NOTE — Progress Notes (Signed)
ANTICOAGULATION CONSULT NOTE-Follow Up  Pharmacy Consult for heparin  Indication: chest pain/ACS  Allergies  Allergen Reactions   Clonazepam Other (See Comments)    Headache    Lisinopril Other (See Comments)    headache    Patient Measurements: Height: '6\' 3"'$  (190.5 cm) Weight: 78.7 kg (173 lb 8 oz) IBW/kg (Calculated) : 84.5 Heparin Dosing Weight: 78.5kg   Vital Signs: Temp: 97.7 F (36.5 C) (10/14 0733) Temp Source: Axillary (10/14 0733) BP: 126/65 (10/14 0733) Pulse Rate: 85 (10/14 0733)  Labs: Recent Labs    08/27/22 0309 08/27/22 0518 08/27/22 0819 08/27/22 1656 08/28/22 0642  HGB 12.4*  --  11.9*  --  12.2*  HCT 36.3*  --  35.0*  --  36.1*  PLT 167  --   --   --  170  HEPARINUNFRC  --   --   --  0.26* 0.22*  CREATININE 3.09*  --   --   --  2.51*  TROPONINIHS 26* 59* 107*  --  56*    Estimated Creatinine Clearance: 27.9 mL/min (A) (by C-G formula based on SCr of 2.51 mg/dL (H)).   Assessment: Patient presented with a CC of chest pain, left shoulder/pain. Patient not on anticoagulation at home, only Brilinta. Trop 59, pharmacy consulted to dose heparin.   Heparin level slightly subtherapeutic 0.22 from 0.26 on infusion at 900 units/hr. Hgb ~12 and plts WNL. No issues with line, infusion site looks good, or bleeding reported per RN.  Goal of Therapy:  Heparin level 0.3-0.7 units/ml Monitor platelets by anticoagulation protocol: Yes   Plan:  Increase heparin infusion to 1200 units/hr Heparin level within 8 hours Monitor CBC and for signs/symptoms of bleed daily  Sandford Craze, PharmD. [facility] PGY-1  08/28/2022 8:27 AM

## 2022-08-28 NOTE — Progress Notes (Signed)
Progress Note  Patient Name: Jesus Young Date of Encounter: 08/28/2022  Attending physician: Patrecia Pour, MD Primary care provider: Merrilee Seashore, MD Primary Cardiologist: Rex Kras, DO, [facility]  Subjective: Jesus Young is a 76 y.o. male who was seen and examined at bedside  Resting in bed eating dinner.  No shortness of breath and minimal chest pain.  Case discussed and reviewed with his nurse.  Objective: Vital Signs in the last 24 hours: Temp:  [97.6 F (36.4 C)-98.2 F (36.8 C)] 97.7 F (36.5 C) (10/14 0733) Pulse Rate:  [77-85] 83 (10/14 1039) Resp:  [16-19] 16 (10/14 0733) BP: (121-127)/(63-71) 125/71 (10/14 1039) SpO2:  [97 %-100 %] 100 % (10/14 0733)  Intake/Output:  Intake/Output Summary (Last 24 hours) at 08/28/2022 1921 Last data filed at 08/28/2022 1634 Gross per 24 hour  Intake 322.08 ml  Output 2025 ml  Net -1702.92 ml    Net IO Since Admission: -1,580.27 mL [08/28/22 1921]  Weights:  Filed Weights   08/27/22 1614  Weight: 78.7 kg    Telemetry: Personally reviewed. NSR w/o ectopy.   Physical examination: PHYSICAL EXAM: Today's Vitals   08/28/22 0416 08/28/22 0733 08/28/22 0825 08/28/22 1039  BP: 127/68 126/65  125/71  Pulse: 77 85  83  Resp: 16 16    Temp: 98.2 F (36.8 C) 97.7 F (36.5 C)    TempSrc: Oral Axillary    SpO2: 98% 100%    Weight:      Height:      PainSc:   0-No pain    Body mass index is 21.69 kg/m.  Temp:  [97.6 F (36.4 C)-98.2 F (36.8 C)] 97.7 F (36.5 C) (10/14 0733) Pulse Rate:  [77-85] 83 (10/14 1039) Resp:  [16-19] 16 (10/14 0733) BP: (121-127)/(63-71) 125/71 (10/14 1039) SpO2:  [97 %-100 %] 100 % (10/14 0733)  CONSTITUTIONAL: Well-developed and well-nourished. No acute distress.  SKIN: Skin is warm and dry. No rash noted. No cyanosis. No pallor. No jaundice HEAD: Normocephalic and atraumatic.  EYES: No scleral icterus MOUTH/THROAT: Moist oral membranes.  NECK: No JVD present. No  thyromegaly noted. No carotid bruits  CHEST Normal respiratory effort. No intercostal retractions  LUNGS: Clear to auscultation bilaterally.  No stridor. No wheezes. No rales.  CARDIOVASCULAR: Regular rate and rhythm, positive Z6-X0, holosystolic murmur, no rubs or gallops appreciated. ABDOMINAL: Soft, nontender, nondistended, positive bowel sounds in all 4 quadrants, no apparent ascites.  EXTREMITIES: No pitting edema, warm to touch,  HEMATOLOGIC: No significant bruising NEUROLOGIC: Oriented to person, place, and time. Nonfocal. Normal muscle tone.  PSYCHIATRIC: Normal mood and affect. Normal behavior. Cooperative  Lab Results: Hematology Recent Labs  Lab 08/23/22 1035 08/27/22 0309 08/27/22 0819 08/28/22 0642  WBC 5.9 5.4  --  5.6  RBC 4.50 3.89*  --  3.87*  HGB 14.3 12.4* 11.9* 12.2*  HCT 41.7 36.3* 35.0* 36.1*  MCV 92.7 93.3  --  93.3  MCH 31.8 31.9  --  31.5  MCHC 34.3 34.2  --  33.8  RDW 13.1 13.3  --  13.2  PLT 199 167  --  170    Chemistry Recent Labs  Lab 08/23/22 1035 08/27/22 0309 08/28/22 0642  NA 133* 133* 134*  K 3.3* 2.9* 3.4*  CL 92* 93* 103  CO2 32 26 25  GLUCOSE 202* 327* 104*  BUN 47* 44* 34*  CREATININE 2.97* 3.09* 2.51*  CALCIUM 9.3 8.7* 8.5*  PROT 7.4  --   --   ALBUMIN 4.3  --  3.2*  AST 22  --   --   ALT 22  --   --   ALKPHOS 41  --   --   BILITOT 0.7  --   --   GFRNONAA 21* 20* 26*  ANIONGAP '9 14 6     '$ Cardiac Enzymes: Cardiac Panel (last 3 results) Recent Labs    08/27/22 0518 08/27/22 0819 08/28/22 0642  TROPONINIHS 59* 107* 56*    BNP (last 3 results) Recent Labs    11/12/21 1333 01/28/22 1500 08/27/22 1656  BNP 431.4* 611.6* 972.2*    ProBNP (last 3 results) No results for input(s): "PROBNP" in the last 8760 hours.   DDimer No results for input(s): "DDIMER" in the last 168 hours.   Hemoglobin A1c:  Lab Results  Component Value Date   HGBA1C 7.7 (H) 08/27/2022   MPG 174.29 08/27/2022    TSH  Recent Labs     08/27/22 0819  TSH 2.142    Lipid Panel No results found for: "CHOL", "TRIG", "HDL", "CHOLHDL", "VLDL", "LDLCALC", "LDLDIRECT"  Imaging: US RENAL  Result Date: 08/27/2022 CLINICAL DATA:  AKI. EXAM: RENAL / URINARY TRACT ULTRASOUND COMPLETE COMPARISON:  10/23/2021. FINDINGS: Right Kidney: Renal measurements: 9.0 x 4.5 x 4.8 cm = volume: 101.9 mL. Echogenicity within normal limits. No mass or hydronephrosis visualized. Left Kidney: Renal measurements: 10.0 x 4.8 x 6.6 cm = volume: 165.8 mL. Echogenicity within normal limits. A cyst with a thin septation is noted in the upper pole of the left kidney measuring 1.8 x 1.6 x 1.6 cm. No mass or hydronephrosis visualized. Bladder: Appears normal for degree of bladder distention. Bilateral ureteral jets are noted Other: None. IMPRESSION: 1. Septated cyst in the upper pole of the left kidney, slightly increased in size from the prior exam. 2. Otherwise normal exam. Electronically Signed   By: Brett Fairy M.D.   On: 08/27/2022 21:40   ECHOCARDIOGRAM COMPLETE  Result Date: 08/27/2022    ECHOCARDIOGRAM REPORT   Patient Name:   Jesus Young Date of Exam: 08/27/2022 Medical Rec #:  [id number]          Height:       74.0 in Accession #:    [id number]         Weight:       173.0 lb Date of Birth:  February 19, 1946          BSA:          2.044 m Patient Age:    40 years           BP:           136/75 mmHg Patient Gender: M                  HR:           117 bpm. Exam Location:  Inpatient Procedure: 2D Echo, Cardiac Doppler, Color Doppler and Intracardiac            Opacification Agent Indications:     Chest Pain R07.9  History:         Patient has prior history of Echocardiogram examinations, most                  recent 11/26/2021. CHF, CAD and NSTEMI, Signs/Symptoms:Chest                  Pain and Dyspnea; Risk Factors:Diabetes, Hypertension and  Dyslipidemia.  Sonographer:     Greer Pickerel Referring Phys:  [id number] Rex Kras Diagnosing Phys:  Rex Kras DO  Sonographer Comments: Image acquisition challenging due to respiratory motion. IMPRESSIONS  1. Left ventricular ejection fraction, by estimation, is 25 to 30%. The left ventricle has severely decreased function. The left ventricle demonstrates regional wall motion abnormalities (see scoring diagram/findings for description). The left ventricular internal cavity size was dilated. Left ventricular diastolic parameters are consistent with Grade I diastolic dysfunction (impaired relaxation).  2. Right ventricular systolic function is mildly reduced. The right ventricular size is normal. There is normal pulmonary artery systolic pressure.  3. A small pericardial effusion is present.  4. The mitral valve is grossly normal. Mild mitral valve regurgitation.  5. The aortic valve is tricuspid. Aortic valve regurgitation is not visualized. No aortic stenosis is present.  6. The inferior vena cava is normal in size with greater than 50% respiratory variability, suggesting right atrial pressure of 3 mmHg. Comparison(s): Prior study 11/26/2021: LVEF 40-45%, mildly dilated LV cavity, G1DD, mild MR, mild PR, RWMA (basal anteroseptal, basal anterior, basal inferoseptal, mid anteroseptal, mid anterior, mid inferoseptal, apical anterior). Conclusion(s)/Recommendation(s): No left ventricular mural or apical thrombus/thrombi. FINDINGS  Left Ventricle: Left ventricular ejection fraction, by estimation, is 25 to 30%. The left ventricle has severely decreased function. The left ventricle demonstrates regional wall motion abnormalities. Definity contrast agent was given IV to delineate the left ventricular endocardial borders. The left ventricular internal cavity size was dilated. There is no left ventricular hypertrophy. Left ventricular diastolic parameters are consistent with Grade I diastolic dysfunction (impaired relaxation).  LV Wall Scoring: The entire anterior wall, entire septum, and apex are hypokinetic. Right  Ventricle: The right ventricular size is normal. No increase in right ventricular wall thickness. Right ventricular systolic function is mildly reduced. There is normal pulmonary artery systolic pressure. The tricuspid regurgitant velocity is 0.74 m/s, and with an assumed right atrial pressure of 3 mmHg, the estimated right ventricular systolic pressure is 5.2 mmHg. Left Atrium: Left atrial size was normal in size. Right Atrium: Right atrial size was normal in size. Pericardium: A small pericardial effusion is present. Mitral Valve: The mitral valve is grossly normal. Mild mitral valve regurgitation. Tricuspid Valve: The tricuspid valve is grossly normal. Tricuspid valve regurgitation is trivial. No evidence of tricuspid stenosis. Aortic Valve: The aortic valve is tricuspid. Aortic valve regurgitation is not visualized. No aortic stenosis is present. Aortic valve mean gradient measures 2.0 mmHg. Aortic valve peak gradient measures 2.9 mmHg. Aortic valve area, by VTI measures 2.88 cm. Pulmonic Valve: The pulmonic valve was grossly normal. Pulmonic valve regurgitation is not visualized. No evidence of pulmonic stenosis. Aorta: The aortic root and ascending aorta are structurally normal, with no evidence of dilitation. Venous: The inferior vena cava is normal in size with greater than 50% respiratory variability, suggesting right atrial pressure of 3 mmHg. IAS/Shunts: The interatrial septum was not well visualized. Additional Comments: There is pleural effusion in the left lateral region.  LEFT VENTRICLE PLAX 2D LVIDd:         5.40 cm      Diastology LVIDs:         4.50 cm      LV e' medial:    3.26 cm/s LV PW:         1.10 cm      LV E/e' medial:  26.4 LV IVS:        1.00 cm      LV  e' lateral:   8.27 cm/s LVOT diam:     2.20 cm      LV E/e' lateral: 10.4 LV SV:         50 LV SV Index:   25 LVOT Area:     3.80 cm  LV Volumes (MOD) LV vol d, MOD A2C: 247.0 ml LV vol d, MOD A4C: 226.0 ml LV vol s, MOD A2C: 189.0 ml LV  vol s, MOD A4C: 186.0 ml LV SV MOD A2C:     58.0 ml LV SV MOD A4C:     226.0 ml LV SV MOD BP:      43.5 ml RIGHT VENTRICLE RV S prime:     8.51 cm/s TAPSE (M-mode): 1.2 cm LEFT ATRIUM             Index        RIGHT ATRIUM           Index LA diam:        4.20 cm 2.06 cm/m   RA Area:     15.20 cm LA Vol (A2C):   47.1 ml 23.05 ml/m  RA Volume:   34.40 ml  16.83 ml/m LA Vol (A4C):   63.4 ml 31.02 ml/m LA Biplane Vol: 56.7 ml 27.75 ml/m  AORTIC VALVE                    PULMONIC VALVE AV Area (Vmax):    2.93 cm     PR End Diast Vel: 2.81 msec AV Area (Vmean):   2.41 cm AV Area (VTI):     2.88 cm AV Vmax:           84.80 cm/s AV Vmean:          67.000 cm/s AV VTI:            0.174 m AV Peak Grad:      2.9 mmHg AV Mean Grad:      2.0 mmHg LVOT Vmax:         65.30 cm/s LVOT Vmean:        42.500 cm/s LVOT VTI:          0.132 m LVOT/AV VTI ratio: 0.76  AORTA Ao Root diam: 3.60 cm Ao Asc diam:  3.40 cm MITRAL VALVE               TRICUSPID VALVE MV Area (PHT): 5.23 cm    TR Peak grad:   2.2 mmHg MV Decel Time: 145 msec    TR Vmax:        74.50 cm/s MV E velocity: 86.10 cm/s MV A velocity: 84.00 cm/s  SHUNTS MV E/A ratio:  1.03        Systemic VTI:  0.13 m                            Systemic Diam: 2.20 cm Thatcher Doberstein DO Electronically signed by Rex Kras DO Signature Date/Time: 08/27/2022/1:14:23 PM    Final    DG Chest 2 View  Result Date: 08/27/2022 CLINICAL DATA:  Chest pain EXAM: CHEST - 2 VIEW COMPARISON:  08/21/2021 FINDINGS: The heart size and mediastinal contours are within normal limits. Both lungs are clear. The visualized skeletal structures are unremarkable. IMPRESSION: No active cardiopulmonary disease. Electronically Signed   By: Ulyses Jarred M.D.   On: 08/27/2022 03:36    CARDIAC DATABASE: EKG: 08/27/2022:  NSR, 92 bpm, LBBB, ST-T changes  likely secondary to BBB but underlying ischemia cannot be ruled out.   Normal sinus rhythm, 93 bpm, LBBB, ST-T changes likely secondary to BBB but  underlying ischemia cannot be ruled out.   Normal sinus rhythm, 83 bpm, LBBB, ST-T changes likely secondary to BBB but underlying ischemia cannot be ruled out.   Echocardiogram: 08/27/2022:  1. Left ventricular ejection fraction, by estimation, is 25 to 30%. The  left ventricle has severely decreased function. The left ventricle  demonstrates regional wall motion abnormalities (see scoring  diagram/findings for description). The left  ventricular internal cavity size was dilated. Left ventricular diastolic  parameters are consistent with Grade I diastolic dysfunction (impaired  relaxation).   2. Right ventricular systolic function is mildly reduced. The right  ventricular size is normal. There is normal pulmonary artery systolic  pressure.   3. A small pericardial effusion is present.   4. The mitral valve is grossly normal. Mild mitral valve regurgitation.   5. The aortic valve is tricuspid. Aortic valve regurgitation is not  visualized. No aortic stenosis is present.   6. The inferior vena cava is normal in size with greater than 50%  respiratory variability, suggesting right atrial pressure of 3 mmHg.   11/26/2021:  Mildly depressed LV systolic function with visual EF 40-45%. Left  ventricle cavity is mildly dilated. Mild left ventricular hypertrophy.  Doppler evidence of grade I (impaired) diastolic dysfunction, normal LAP.  Left ventricle regional wall motion findings: Basal anteroseptal, Basal  anterior, Basal inferoseptal, Mid anteroseptal, Mid anterior, Mid  inferoseptal and Apical anterior hypokinesis.  Mild (Grade I) mitral regurgitation.  Mild pulmonic regurgitation.  Compared to study 07/08/2021 LVEF improved from 30-35% to 40-45% with RWMA,  Moderate MR is now mild, otherwise no significant change.    Stress Testing:  Lexiscan Tetrofosmin stress test 11/24/2020: Lexiscan nuclear stress test performed using 1-day protocol. Rest and stress EKG showed sinus rhythm,  IVCD/tachycardia. Normal myocardial perfusion. Stress LVEF 25%. High risk study due to low stress LVEF.   Heart Catheterization: 08/21/2021 LV: 124/7, EDP 13 mmHg.  Ao 122/70, mean 92 mmHg.  No pressure gradient across the aortic valve. LM: Diffusely diseased with occluded proximal LAD and proximal circumflex with minor branches evident. RCA: Occluded in the midsegment and severely diseased proximally. SVG to RCA: Proximal to mid segment has a ulcerated 80 to 90% stenosis.  Native vessel itself in the PL branch has a diffuse 90% stenosis and PDA has 60 to 70% stenosis.  This is unchanged from prior cardiac catheterization. Successful direct stenting with use of a spider filter wire for distal protection and implantation of a 4.0 x 16 mm Synergy XD at 16 atmospheric pressure, 60 seconds, stenosis reduced to 0%.  TIMI-3 to TIMI-3 flow. SVG to D1: Occluded. Free radial graft to OM1: Widely patent. LIMA to LAD: Widely patent.  There is mild to moderate disease in the native LAD which appears to have improved from prior cardiac catheterization.  Scheduled Meds:  aspirin EC  81 mg Oral QHS   busPIRone  20 mg Oral BID   famotidine  20 mg Oral QHS   insulin aspart  0-15 Units Subcutaneous TID WC   insulin detemir  18 Units Subcutaneous q AM   insulin detemir  20 Units Subcutaneous QPM   isosorbide mononitrate  60 mg Oral Daily   levothyroxine  88 mcg Oral Daily   metoprolol tartrate  50 mg Oral BID   potassium chloride  20 mEq Oral Once  ranolazine  500 mg Oral BID   rosuvastatin  40 mg Oral QHS   sodium chloride flush  3 mL Intravenous Q12H   ticagrelor  90 mg Oral BID    Continuous Infusions:  sodium chloride 50 mL/hr at 08/27/22 1951   heparin 1,200 Units/hr (08/28/22 0843)    PRN Meds: acetaminophen **OR** acetaminophen, albuterol, docusate sodium, meclizine, trimethobenzamide   IMPRESSION & RECOMMENDATIONS: Jesus Young is a 76 y.o. Caucasian male whose past medical  history and cardiac risk factors include: CAD status post CABG in 2012, LBBB, HTN, IDDM, hyperlipidemia, chronic kidney disease, polycythemia, history of COVID-19 infection, advanced age.  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 EKG did not illustrate STEMI (confirmed w/ interventional cardiology).  Started on IV Heparin gtt for 48 hrs.  Echo notes severely reduced LVEF w/ RWMAs.  However, due to progressive CKD w/ Scr on arrival of 3.'09mg'$ /dL and GFR 20 ml/hr. Spoke to the patient and his wife at length that if heart catheterization is considered to evaluate of obstructive CAD he would be very high risk to progression to dialysis due to contrast burden leading to CIN. Shared decision was to trend his troponin and symptoms if develops STEMI they are willing to proceed to angiography otherwise medical management for now.  He remained stable clinically and hs troponin peaked at 107.  I also started him on 0.9 NS and held home dose lasix. His Scr has improved to 2.5 mg/dL within 24 hr and he is not having shortness of breath or HF symptoms. Nephrology recommendation appreciated.  Continue DAPT, Imdur, Metoprolol, Ranexa, and nitro prn.  Holding off on GDMT (ACEi/ARB/ARNI/MRA/SGLT2i) due to renal function.  Educated him on better glycemia control.  Check fasting lipid and direct LDL and lpa.  Spoke to RN to have him ambulate on the floors to see if he has exertional angina symptoms. He will have some degree of angina given his history. However, if the symptoms increase in intensity, frequency, duration, or has typical  chest pain he is asked to inform the RN ASAP. If he remains stable overnight he could potentially go home after 48 hr of IV Heparin. Nephrology recommendations will also be crucial for d/c planning.   Patient's questions and concerns were addressed to his satisfaction. He voices understanding of the instructions provided during this encounter.   This note was created using a voice recognition software as a result there may be grammatical errors inadvertently enclosed that do not reflect the nature of this encounter. Every attempt is made to correct such errors.  Mechele Claude [facility]  Pager: [phone] Office: [phone] 08/28/2022, 7:21 PM

## 2022-08-28 NOTE — Progress Notes (Signed)
Patient ambulated in hall independently without difficulty.  Denies CP or SOB.  Jesus Young

## 2022-08-29 ENCOUNTER — Other Ambulatory Visit: Payer: Self-pay | Admitting: Cardiology

## 2022-08-29 DIAGNOSIS — Z951 Presence of aortocoronary bypass graft: Secondary | ICD-10-CM

## 2022-08-29 DIAGNOSIS — E119 Type 2 diabetes mellitus without complications: Secondary | ICD-10-CM

## 2022-08-29 DIAGNOSIS — I209 Angina pectoris, unspecified: Secondary | ICD-10-CM

## 2022-08-29 DIAGNOSIS — Z955 Presence of coronary angioplasty implant and graft: Secondary | ICD-10-CM

## 2022-08-29 LAB — CBC
HCT: 35.4 % — ABNORMAL LOW (ref 39.0–52.0)
Hemoglobin: 12.1 g/dL — ABNORMAL LOW (ref 13.0–17.0)
MCH: 32 pg (ref 26.0–34.0)
MCHC: 34.2 g/dL (ref 30.0–36.0)
MCV: 93.7 fL (ref 80.0–100.0)
Platelets: 167 10*3/uL (ref 150–400)
RBC: 3.78 MIL/uL — ABNORMAL LOW (ref 4.22–5.81)
RDW: 13.5 % (ref 11.5–15.5)
WBC: 5.8 10*3/uL (ref 4.0–10.5)
nRBC: 0 % (ref 0.0–0.2)

## 2022-08-29 LAB — RENAL FUNCTION PANEL
Albumin: 3.1 g/dL — ABNORMAL LOW (ref 3.5–5.0)
Anion gap: 7 (ref 5–15)
BUN: 28 mg/dL — ABNORMAL HIGH (ref 8–23)
CO2: 22 mmol/L (ref 22–32)
Calcium: 8.7 mg/dL — ABNORMAL LOW (ref 8.9–10.3)
Chloride: 106 mmol/L (ref 98–111)
Creatinine, Ser: 2.33 mg/dL — ABNORMAL HIGH (ref 0.61–1.24)
GFR, Estimated: 28 mL/min — ABNORMAL LOW (ref >60)
Glucose, Bld: 150 mg/dL — ABNORMAL HIGH (ref 70–99)
Phosphorus: 2.1 mg/dL — ABNORMAL LOW (ref 2.5–4.6)
Potassium: 3.5 mmol/L (ref 3.5–5.1)
Sodium: 135 mmol/L (ref 135–145)

## 2022-08-29 LAB — LIPID PANEL
Cholesterol: 109 mg/dL (ref 0–200)
HDL: 29 mg/dL — ABNORMAL LOW (ref >40)
LDL Cholesterol: 37 mg/dL (ref 0–99)
Total CHOL/HDL Ratio: 3.8 RATIO
Triglycerides: 214 mg/dL — ABNORMAL HIGH (ref <150)
VLDL: 43 mg/dL — ABNORMAL HIGH (ref 0–40)

## 2022-08-29 LAB — GLUCOSE, CAPILLARY
Glucose-Capillary: 63 mg/dL — ABNORMAL LOW (ref 70–99)
Glucose-Capillary: 85 mg/dL (ref 70–99)
Glucose-Capillary: 116 mg/dL — ABNORMAL HIGH (ref 70–99)
Glucose-Capillary: 149 mg/dL — ABNORMAL HIGH (ref 70–99)

## 2022-08-29 LAB — BRAIN NATRIURETIC PEPTIDE: B Natriuretic Peptide: 1350.9 pg/mL — ABNORMAL HIGH (ref 0.0–100.0)

## 2022-08-29 LAB — LDL CHOLESTEROL, DIRECT: Direct LDL: 51 mg/dL (ref 0–99)

## 2022-08-29 MED ORDER — METOPROLOL SUCCINATE ER 50 MG PO TB24: 50.0000 mg | ORAL_TABLET | Freq: Every day | ORAL | 0 refills | Status: DC

## 2022-08-29 MED ORDER — FUROSEMIDE 40 MG PO TABS: 40.0000 mg | ORAL_TABLET | Freq: Every day | ORAL | 0 refills | Status: DC

## 2022-08-29 MED ORDER — FAMOTIDINE 20 MG PO TABS: 20.0000 mg | ORAL_TABLET | Freq: Every day | ORAL | 0 refills | Status: DC

## 2022-08-29 MED ORDER — RANOLAZINE ER 500 MG PO TB12: 500.0000 mg | ORAL_TABLET | Freq: Two times a day (BID) | ORAL | 0 refills | Status: DC

## 2022-08-29 NOTE — Discharge Summary (Signed)
Physician Discharge Summary   Patient: Jesus Young MRN: [id number] DOB: Nov 29, 1945  Admit date:     08/27/2022  Discharge date: 08/29/22  Discharge Physician: Patrecia Pour   PCP: Merrilee Seashore, MD   Recommendations at discharge:  Follow up with [place] in the next 1-2 weeks, will be arranged per nephrology.  Follow up with PCP in the next week to recheck labs and volume status Follow up with endocrinology for diabetes management with latest HbA1c 7.7%.  Patient will follow up with [facility], Dr. Terri Skains, in 2 weeks after admission for NSTEMI with new wall motion abnormalities and depressed LVEF to 25%. Changed metoprolol to succinate, changed diuretic to lasix '40mg'$  once daily, and decreased doses of pepcid and ranexa due to renal impairment.  Discharge Diagnoses: Principal Problem:   Angina pectoris ([place]) Active Problems:   CAD (coronary artery disease)   Elevated troponin   Hypokalemia   Acute kidney injury superimposed on chronic kidney disease (HCC)   Hyponatremia   Chronic combined systolic and diastolic heart failure (HCC)   Polycythemia vera (HCC)   Benign hypertension   Hyperglycemia due to type 2 diabetes mellitus ([place])   Uncontrolled type 2 diabetes mellitus with hyperglycemia, with long-term current use of insulin (HCC)   Prolonged QT interval   Anxiety   Hypothyroidism   Pure hypercholesterolemia   History of coronary artery bypass graft   History of coronary artery stent placement   Insulin dependent type 2 diabetes mellitus [facility])  Hospital Course: HPI: Jesus Young is a 76 y.o. male with medical history significant of hypertension, hyperlipidemia, CAD s/p CABG along with history of PCI, LBBB, diabetes mellitus type 2, CKD stage IV, hypothyroidism, and primary polycythemia presents with complaints of chest pain.  Patient reported having left-sided chest pain starting late last night.  Reported having a pressure-like  pain radiating into his left arm.  He had taken 1 tablet of nitroglycerin with temporary relief for 15-20 minutes.  He was getting ready for bed and was moving around when the pain returned and took his second nitroglycerin.  Again symptoms resolved only temporarily or returning for which she called EMS.  Patient reported having a brief episode of nausea and shortness of breath prior to getting in the ambulance.  Denied having any significant diaphoresis, vomiting, abdominal pain, fever, blood in stool, or dysuria symptoms.  He is followed by Dr. Posey Pronto of nephrology in the outpatient setting.  Patient notes recent changes in his diuretics.  He was switched by Dr. Posey Pronto from 80 mg to 40 mg sometime in August, but thereafter reports being switched to 80 mg twice daily.  He had been taking this and lost approximately 20 pounds over the last 2 months.  He had been told to stop Lasix altogether yesterday after initial appointment with Dr. Terri Skains.  Patient notes that he had phlebotomy done on the 6th for his polycythemia, then had blood work done when he was seen by Dr. Lindi Adie on the 9th, and reports blood work being done yesterday.   On admission into the emergency department patient was noted to have stable vital signs.  Labs significant for hemoglobin 12.4, sodium 133, potassium 2.9, BUN 44, creatinine 3.09, glucose 327, and high-sensitivity troponin 26->59.  Chest x-ray showed no acute abnormality.  EKG without any significant ischemic changes.  Cardiology have been consulted.  Patient has been given a total of 60 mEq of potassium chloride, morphine IV, magnesium sulfate, and started on a heparin  drip.  Assessment and Plan: NSTEMI, CAD s/p CABG 2012, s/p PCI to SVG to ramus graft 2022:  - Appreciate cardiology recommendations. We are continuing DAPT with ASA and brilinta. Completed heparin gtt x48 hours. He has had intermittent pains which may be anginal at times with walking but this afternoon walked in plain  clothes (he really wants to go home) the length of the hall to speak with me without any issues.  - Discussed with cardiology, Dr. Terri Skains, at length. Troponin elevation is mild and symptoms have improved, they recommend discharge with medical therapy and outpatient follow up. Per their note, will also refer to [facility] for second opinion. - Continue metoprolol, likely to change to succinate prior to discharge.  - Continue imdur and ranexa (decrease dose due to renal insufficiency). - Continue high intensity statin (rosuvastatin '40mg'$ ). LDL 37, HDL 29      Acute kidney injury superimposed on CKD stage IIIb: Renal function slowly improving. US renal unremarkable, improvement is consistent with overdiuresis PTA. - Nephrology assistance appreciated. D/w Dr. Marval Regal who recommends lasix '40mg'$  daily. Patient was hesitant to start back a standing diuretic, though with his newly depressed EF, he understands rationale for modest dosing of lasix and will monitor volume status closely. Nephrology to arrange follow up at [place].  - Minimize nephrotoxins. With severity of echocardiogram findings and coronary disease, anticipate he will require cath. Nephrology has discussed with him the risk of CIN and patient would accept that risk     Chronic combined HFrEF with worsening LVEF to 25-30% and regional wall motion abnormalities on echo 10/13.  - Holding GDMT due to AKI pending further follow up, though on beta blocker (will change to succinate).  - Monitor volume status closely at follow up. Baseline weight and BNP pending at discharge.    Hypokalemia: Improved with supplementation.  - Recheck at follow up.   Polycythemia vera: Regular RBC donations. Hgb 12.1 currently - Monitor while on heparin gtt. No active bleeding   HTN:  - Continue metoprolol '50mg'$  BID, imdur '60mg'$  - Decrease dose of ranexa given renal impairment. D/w cardiology.   Hyponatremia: Mild, resolved.   IDT2DM: Followed for years by Dr. Chalmers Cater.  -  HbA1c historically 6.6%, now 7.7%. He takes an active interest in management and will continue following up with Dr. Chalmers Cater, recognizes need for improved chronic glycemic control.   Prolonged QT interval: QTc 557 - Avoid QT prolonging medications   Hypophosphatemia: Mild.    GERD:  - Continue pepcid, decrease dose with renal insufficiency   Anxiety - Continue buspar   Hypothyroidism: TSH 2.142 - Continue synthroid home dose   Hyperlipidemia - Continue statin  Consultants: Nephrology, cardiology Procedures performed: Echo  Disposition: Home Diet recommendation:  Cardiac and Carb modified diet DISCHARGE MEDICATION: Allergies as of 08/29/2022       Reactions   Clonazepam Other (See Comments)   Headache   Lisinopril Other (See Comments)   headache        Medication List     STOP taking these medications    metoprolol tartrate 50 MG tablet Commonly known as: LOPRESSOR       TAKE these medications    acetaminophen 650 MG CR tablet Commonly known as: TYLENOL Take 1,300 mg by mouth in the morning and at bedtime.   aspirin EC 81 MG tablet Take 81 mg by mouth at bedtime.   B-D ULTRAFINE III SHORT PEN 31G X 8 MM Misc Generic drug: Insulin Pen Needle USE UTD  busPIRone 10 MG tablet Commonly known as: BUSPAR Take 20 mg by mouth 2 (two) times daily.   Calcium Citrate-Vitamin D 315-5 MG-MCG Tabs Take 1 tablet by mouth daily at 12 noon.   Centrum Silver tablet Take 1 tablet by mouth daily.   famotidine 20 MG tablet Commonly known as: PEPCID Take 1 tablet (20 mg total) by mouth at bedtime. What changed:  medication strength how much to take   furosemide 40 MG tablet Commonly known as: LASIX Take 1 tablet (40 mg total) by mouth daily. What changed:  when to take this reasons to take this   GENTLE STOOL SOFTENER PO Take 600 mg by mouth daily.   insulin detemir 100 UNIT/ML injection Commonly known as: LEVEMIR Inject 0.16 mLs (16 Units total) into  the skin 2 (two) times daily. What changed:  how much to take when to take this additional instructions   insulin lispro 100 UNIT/ML injection Commonly known as: HUMALOG Inject 0.05-0.09 mLs (5-9 Units total) into the skin 2 (two) times daily. 5 units at lunch, and 9 units after dinner. What changed:  how much to take additional instructions   isosorbide mononitrate 60 MG 24 hr tablet Commonly known as: IMDUR Take 1 tablet (60 mg total) by mouth daily.   levothyroxine 88 MCG tablet Commonly known as: SYNTHROID Take 88 mcg by mouth daily.   meclizine 25 MG tablet Commonly known as: ANTIVERT Take 25 mg by mouth 3 (three) times daily as needed (for vertigo).   metoprolol succinate 50 MG 24 hr tablet Commonly known as: Toprol XL Take 1 tablet (50 mg total) by mouth daily. Take with or immediately following a meal.   nitroGLYCERIN 0.4 MG SL tablet Commonly known as: NITROSTAT Place 1 tablet (0.4 mg total) under the tongue every 5 (five) minutes as needed for chest pain.   ONE TOUCH ULTRA TEST test strip Generic drug: glucose blood   pantoprazole 40 MG tablet Commonly known as: PROTONIX Take 40 mg by mouth at bedtime.   ranolazine 500 MG 12 hr tablet Commonly known as: RANEXA Take 1 tablet (500 mg total) by mouth 2 (two) times daily. What changed:  medication strength how much to take   rosuvastatin 40 MG tablet Commonly known as: CRESTOR Take 40 mg by mouth at bedtime.   ticagrelor 90 MG Tabs tablet Commonly known as: Brilinta TAKE 1 TABLET(90 MG) BY MOUTH TWICE DAILY What changed:  how much to take how to take this when to take this additional instructions        Follow-up Information     Tolia, Sunit, DO Follow up in 2 week(s).   Specialties: Cardiology, Vascular Surgery Contact information: [place] [address] [phone]         Elmarie Shiley, MD Follow up.   Specialty: Nephrology Contact information: [address] [place] [address] [phone]                Discharge Exam: Filed Weights   08/27/22 1614 08/29/22 1500  Weight: 78.7 kg 78.6 kg  BP 129/72   Pulse 84   Temp 97.7 F (36.5 C) (Oral)   Resp 18   Ht '6\' 3"'$  (1.905 m)   Wt 78.6 kg   SpO2 100%   BMI 21.66 kg/m   No distress, well-appearing, alert and interactive RRR without MRG, JVD or edema Clear and nonlabored Soft, NT, ND, +BS Narrow-based steady gait, normal speech.  Condition at discharge: stable  The results of significant diagnostics from this hospitalization (including imaging, microbiology, ancillary and laboratory) are listed below for reference.   Imaging Studies: US RENAL  Result Date: 08/27/2022 CLINICAL DATA:  AKI. EXAM: RENAL / URINARY TRACT ULTRASOUND COMPLETE COMPARISON:  10/23/2021. FINDINGS: Right Kidney: Renal measurements: 9.0 x 4.5 x 4.8 cm = volume: 101.9 mL. Echogenicity within normal limits. No mass or hydronephrosis visualized. Left Kidney: Renal measurements: 10.0 x 4.8 x 6.6 cm = volume: 165.8 mL. Echogenicity within normal limits. A cyst with a thin septation is noted in the upper pole of the left kidney measuring 1.8 x 1.6 x 1.6 cm. No mass or hydronephrosis visualized. Bladder: Appears normal for degree of bladder distention. Bilateral ureteral jets are noted Other: None. IMPRESSION: 1. Septated cyst in the upper pole of the left kidney, slightly increased in size from the prior exam. 2. Otherwise normal exam. Electronically Signed   By: Brett Fairy M.D.   On: 08/27/2022 21:40   ECHOCARDIOGRAM COMPLETE  Result Date: 08/27/2022    ECHOCARDIOGRAM REPORT   Patient Name:   Jesus Young Date of Exam: 08/27/2022 Medical Rec #:  [id number]          Height:       74.0 in Accession #:    [id number]         Weight:       173.0 lb Date of Birth:  [birth year]          BSA:          2.044 m Patient Age:    37 years           BP:           136/75 mmHg Patient Gender: M                  HR:            117 bpm. Exam Location:  Inpatient Procedure: 2D Echo, Cardiac Doppler, Color Doppler and Intracardiac            Opacification Agent Indications:     Chest Pain R07.9  History:         Patient has prior history of Echocardiogram examinations, most                  recent 11/26/2021. CHF, CAD and NSTEMI, Signs/Symptoms:Chest                  Pain and Dyspnea; Risk Factors:Diabetes, Hypertension and                  Dyslipidemia.  Sonographer:     Greer Pickerel Referring Phys:  [id number] Rex Kras Diagnosing Phys: Rex Kras DO  Sonographer Comments: Image acquisition challenging due to respiratory motion. IMPRESSIONS  1. Left ventricular ejection fraction, by estimation, is 25 to 30%. The left ventricle has severely decreased function. The left ventricle demonstrates regional wall motion abnormalities (see scoring diagram/findings for description). The left ventricular internal cavity size was dilated. Left ventricular diastolic parameters are consistent with Grade I diastolic dysfunction (impaired relaxation).  2. Right ventricular systolic function is mildly reduced. The right ventricular size is normal. There is normal pulmonary artery systolic pressure.  3. A small pericardial effusion is present.  4. The mitral valve is grossly normal. Mild mitral valve regurgitation.  5. The aortic valve is tricuspid. Aortic valve regurgitation is not visualized. No aortic stenosis is present.  6. The inferior vena cava is normal in size with  greater than 50% respiratory variability, suggesting right atrial pressure of 3 mmHg. Comparison(s): Prior study 11/26/2021: LVEF 40-45%, mildly dilated LV cavity, G1DD, mild MR, mild PR, RWMA (basal anteroseptal, basal anterior, basal inferoseptal, mid anteroseptal, mid anterior, mid inferoseptal, apical anterior). Conclusion(s)/Recommendation(s): No left ventricular mural or apical thrombus/thrombi. FINDINGS  Left Ventricle: Left ventricular ejection fraction, by estimation, is 25 to  30%. The left ventricle has severely decreased function. The left ventricle demonstrates regional wall motion abnormalities. Definity contrast agent was given IV to delineate the left ventricular endocardial borders. The left ventricular internal cavity size was dilated. There is no left ventricular hypertrophy. Left ventricular diastolic parameters are consistent with Grade I diastolic dysfunction (impaired relaxation).  LV Wall Scoring: The entire anterior wall, entire septum, and apex are hypokinetic. Right Ventricle: The right ventricular size is normal. No increase in right ventricular wall thickness. Right ventricular systolic function is mildly reduced. There is normal pulmonary artery systolic pressure. The tricuspid regurgitant velocity is 0.74 m/s, and with an assumed right atrial pressure of 3 mmHg, the estimated right ventricular systolic pressure is 5.2 mmHg. Left Atrium: Left atrial size was normal in size. Right Atrium: Right atrial size was normal in size. Pericardium: A small pericardial effusion is present. Mitral Valve: The mitral valve is grossly normal. Mild mitral valve regurgitation. Tricuspid Valve: The tricuspid valve is grossly normal. Tricuspid valve regurgitation is trivial. No evidence of tricuspid stenosis. Aortic Valve: The aortic valve is tricuspid. Aortic valve regurgitation is not visualized. No aortic stenosis is present. Aortic valve mean gradient measures 2.0 mmHg. Aortic valve peak gradient measures 2.9 mmHg. Aortic valve area, by VTI measures 2.88 cm. Pulmonic Valve: The pulmonic valve was grossly normal. Pulmonic valve regurgitation is not visualized. No evidence of pulmonic stenosis. Aorta: The aortic root and ascending aorta are structurally normal, with no evidence of dilitation. Venous: The inferior vena cava is normal in size with greater than 50% respiratory variability, suggesting right atrial pressure of 3 mmHg. IAS/Shunts: The interatrial septum was not well  visualized. Additional Comments: There is pleural effusion in the left lateral region.  LEFT VENTRICLE PLAX 2D LVIDd:         5.40 cm      Diastology LVIDs:         4.50 cm      LV e' medial:    3.26 cm/s LV PW:         1.10 cm      LV E/e' medial:  26.4 LV IVS:        1.00 cm      LV e' lateral:   8.27 cm/s LVOT diam:     2.20 cm      LV E/e' lateral: 10.4 LV SV:         50 LV SV Index:   25 LVOT Area:     3.80 cm  LV Volumes (MOD) LV vol d, MOD A2C: 247.0 ml LV vol d, MOD A4C: 226.0 ml LV vol s, MOD A2C: 189.0 ml LV vol s, MOD A4C: 186.0 ml LV SV MOD A2C:     58.0 ml LV SV MOD A4C:     226.0 ml LV SV MOD BP:      43.5 ml RIGHT VENTRICLE RV S prime:     8.51 cm/s TAPSE (M-mode): 1.2 cm LEFT ATRIUM             Index        RIGHT ATRIUM  Index LA diam:        4.20 cm 2.06 cm/m   RA Area:     15.20 cm LA Vol (A2C):   47.1 ml 23.05 ml/m  RA Volume:   34.40 ml  16.83 ml/m LA Vol (A4C):   63.4 ml 31.02 ml/m LA Biplane Vol: 56.7 ml 27.75 ml/m  AORTIC VALVE                    PULMONIC VALVE AV Area (Vmax):    2.93 cm     PR End Diast Vel: 2.81 msec AV Area (Vmean):   2.41 cm AV Area (VTI):     2.88 cm AV Vmax:           84.80 cm/s AV Vmean:          67.000 cm/s AV VTI:            0.174 m AV Peak Grad:      2.9 mmHg AV Mean Grad:      2.0 mmHg LVOT Vmax:         65.30 cm/s LVOT Vmean:        42.500 cm/s LVOT VTI:          0.132 m LVOT/AV VTI ratio: 0.76  AORTA Ao Root diam: 3.60 cm Ao Asc diam:  3.40 cm MITRAL VALVE               TRICUSPID VALVE MV Area (PHT): 5.23 cm    TR Peak grad:   2.2 mmHg MV Decel Time: 145 msec    TR Vmax:        74.50 cm/s MV E velocity: 86.10 cm/s MV A velocity: 84.00 cm/s  SHUNTS MV E/A ratio:  1.03        Systemic VTI:  0.13 m                            Systemic Diam: 2.20 cm Sunit Tolia DO Electronically signed by Rex Kras DO Signature Date/Time: 08/27/2022/1:14:23 PM    Final    DG Chest 2 View  Result Date: 08/27/2022 CLINICAL DATA:  Chest pain EXAM: CHEST - 2 VIEW  COMPARISON:  08/21/2021 FINDINGS: The heart size and mediastinal contours are within normal limits. Both lungs are clear. The visualized skeletal structures are unremarkable. IMPRESSION: No active cardiopulmonary disease. Electronically Signed   By: Ulyses Jarred M.D.   On: 08/27/2022 03:36    Microbiology: Results for orders placed or performed during the hospital encounter of 08/21/21  Resp Panel by RT-PCR (Flu A&B, Covid) Nasopharyngeal Swab     Status: None   Collection Time: 08/21/21  1:20 AM   Specimen: Nasopharyngeal Swab; Nasopharyngeal(NP) swabs in vial transport medium  Result Value Ref Range Status   SARS Coronavirus 2 by RT PCR NEGATIVE NEGATIVE Final    Comment: (NOTE) SARS-CoV-2 target nucleic acids are NOT DETECTED.  The SARS-CoV-2 RNA is generally detectable in upper respiratory specimens during the acute phase of infection. The lowest concentration of SARS-CoV-2 viral copies this assay can detect is 138 copies/mL. A negative result does not preclude SARS-Cov-2 infection and should not be used as the sole basis for treatment or other patient management decisions. A negative result may occur with  improper specimen collection/handling, submission of specimen other than nasopharyngeal swab, presence of viral mutation(s) within the areas targeted by this assay, and inadequate number of viral copies(<138 copies/mL). A negative result must  be combined with clinical observations, patient history, and epidemiological information. The expected result is Negative.  Fact Sheet for Patients:  EntrepreneurPulse.com.au  Fact Sheet for Healthcare Providers:  IncredibleEmployment.be  This test is no t yet approved or cleared by the [place] FDA and  has been authorized for detection and/or diagnosis of SARS-CoV-2 by FDA under an Emergency Use Authorization (EUA). This EUA will remain  in effect (meaning this test can be used) for the duration  of the COVID-19 declaration under Section 564(b)(1) of the Act, 21 U.S.C.section 360bbb-3(b)(1), unless the authorization is terminated  or revoked sooner.       Influenza A by PCR NEGATIVE NEGATIVE Final   Influenza B by PCR NEGATIVE NEGATIVE Final    Comment: (NOTE) The Xpert Xpress SARS-CoV-2/FLU/RSV plus assay is intended as an aid in the diagnosis of influenza from Nasopharyngeal swab specimens and should not be used as a sole basis for treatment. Nasal washings and aspirates are unacceptable for Xpert Xpress SARS-CoV-2/FLU/RSV testing.  Fact Sheet for Patients: EntrepreneurPulse.com.au  Fact Sheet for Healthcare Providers: IncredibleEmployment.be  This test is not yet approved or cleared by the [place] FDA and has been authorized for detection and/or diagnosis of SARS-CoV-2 by FDA under an Emergency Use Authorization (EUA). This EUA will remain in effect (meaning this test can be used) for the duration of the COVID-19 declaration under Section 564(b)(1) of the Act, 21 U.S.C. section 360bbb-3(b)(1), unless the authorization is terminated or revoked.  Performed at [facility], [place] [address] [place], [place] [address]     Labs: CBC: Recent Labs  Lab 08/23/22 1035 08/27/22 0309 08/27/22 0819 08/28/22 0642 08/29/22 0229  WBC 5.9 5.4  --  5.6 5.8  NEUTROABS 4.3  --   --   --   --   HGB 14.3 12.4* 11.9* 12.2* 12.1*  HCT 41.7 36.3* 35.0* 36.1* 35.4*  MCV 92.7 93.3  --  93.3 93.7  PLT 199 167  --  170 948   Basic Metabolic Panel: Recent Labs  Lab 08/23/22 1035 08/27/22 0309 08/27/22 0518 08/28/22 0642 08/29/22 0229  NA 133* 133*  --  134* 135  K 3.3* 2.9*  --  3.4* 3.5  CL 92* 93*  --  103 106  CO2 32 26  --  25 22  GLUCOSE 202* 327*  --  104* 150*  BUN 47* 44*  --  34* 28*  CREATININE 2.97* 3.09*  --  2.51* 2.33*  CALCIUM 9.3 8.7*  --  8.5* 8.7*  MG  --   --  1.9  --   --   PHOS  --   --   --  2.4*  2.1*   Liver Function Tests: Recent Labs  Lab 08/23/22 1035 08/28/22 0642 08/29/22 0229  AST 22  --   --   ALT 22  --   --   ALKPHOS 41  --   --   BILITOT 0.7  --   --   PROT 7.4  --   --   ALBUMIN 4.3 3.2* 3.1*   CBG: Recent Labs  Lab 08/28/22 2135 08/29/22 0644 08/29/22 0710 08/29/22 0839 08/29/22 1134  GLUCAP 248* 63* 85 116* 149*    Discharge time spent: greater than 30 minutes.  Signed: Patrecia Pour, MD [facility] 08/29/2022

## 2022-08-29 NOTE — Progress Notes (Signed)
Progress Note  Patient Name: Jesus Young Date of Encounter: 08/29/2022  Attending physician: Patrecia Pour, MD Primary care provider: Merrilee Seashore, MD Primary Cardiologist: Rex Kras, DO, [facility]  Subjective: Jesus Young is a 76 y.o. male who was seen and examined at bedside  Denies chest pain at rest or with effort related activities (ambulating on the floors). No dysrhythmia on telemetry. Hemodynamically stable. Wishes to go home. Renal function improving. Completed 48 hours of IV heparin infusion. Case discussed and reviewed with his nurse and attending physician.  Objective: Vital Signs in the last 24 hours: Temp:  [97.7 F (36.5 C)-98.3 F (36.8 C)] 97.7 F (36.5 C) (10/15 0646) Pulse Rate:  [82-86] 84 (10/15 0853) Resp:  [16-18] 18 (10/15 0646) BP: (128-134)/(63-75) 129/72 (10/15 0853) SpO2:  [96 %-100 %] 100 % (10/15 0646)  Intake/Output:  Intake/Output Summary (Last 24 hours) at 08/29/2022 1415 Last data filed at 08/29/2022 0648 Gross per 24 hour  Intake 1731.64 ml  Output 1525 ml  Net 206.64 ml    Net IO Since Admission: -948.63 mL [08/29/22 1415]  Weights:  Filed Weights   08/27/22 1614  Weight: 78.7 kg    Telemetry: Personally reviewed. NSR w/o ectopy.   Physical examination: PHYSICAL EXAM: Today's Vitals   08/28/22 2217 08/29/22 0646 08/29/22 0810 08/29/22 0853  BP: 134/63 132/73  129/72  Pulse: 85 82  84  Resp:  18    Temp:  97.7 F (36.5 C)    TempSrc:  Oral    SpO2: 96% 100%    Weight:      Height:      PainSc:   1     Body mass index is 21.69 kg/m.  Temp:  [97.7 F (36.5 C)-98.3 F (36.8 C)] 97.7 F (36.5 C) (10/15 0646) Pulse Rate:  [82-86] 84 (10/15 0853) Resp:  [16-18] 18 (10/15 0646) BP: (128-134)/(63-75) 129/72 (10/15 0853) SpO2:  [96 %-100 %] 100 % (10/15 0646)  CONSTITUTIONAL: Well-developed and well-nourished. No acute distress.  SKIN: Skin is warm and dry. No rash noted. No cyanosis. No pallor.  No jaundice HEAD: Normocephalic and atraumatic.  EYES: No scleral icterus MOUTH/THROAT: Moist oral membranes.  NECK: No JVD present. No thyromegaly noted. No carotid bruits  CHEST Normal respiratory effort. No intercostal retractions  LUNGS: Clear to auscultation bilaterally.  No stridor. No wheezes. No rales.  CARDIOVASCULAR: Regular rate and rhythm, positive O8-N8, holosystolic murmur, no rubs or gallops appreciated. ABDOMINAL: Soft, nontender, nondistended, positive bowel sounds in all 4 quadrants, no apparent ascites.  EXTREMITIES: No pitting edema, warm to touch,  HEMATOLOGIC: No significant bruising NEUROLOGIC: Oriented to person, place, and time. Nonfocal. Normal muscle tone.  PSYCHIATRIC: Normal mood and affect. Normal behavior. Cooperative. No change in physical examination compared to yesterday.  Lab Results: Hematology Recent Labs  Lab 08/27/22 0309 08/27/22 0819 08/28/22 0642 08/29/22 0229  WBC 5.4  --  5.6 5.8  RBC 3.89*  --  3.87* 3.78*  HGB 12.4* 11.9* 12.2* 12.1*  HCT 36.3* 35.0* 36.1* 35.4*  MCV 93.3  --  93.3 93.7  MCH 31.9  --  31.5 32.0  MCHC 34.2  --  33.8 34.2  RDW 13.3  --  13.2 13.5  PLT 167  --  170 167    Chemistry Recent Labs  Lab 08/23/22 1035 08/27/22 0309 08/28/22 0642 08/29/22 0229  NA 133* 133* 134* 135  K 3.3* 2.9* 3.4* 3.5  CL 92* 93* 103 106  CO2 32 26 25  22  GLUCOSE 202* 327* 104* 150*  BUN 47* 44* 34* 28*  CREATININE 2.97* 3.09* 2.51* 2.33*  CALCIUM 9.3 8.7* 8.5* 8.7*  PROT 7.4  --   --   --   ALBUMIN 4.3  --  3.2* 3.1*  AST 22  --   --   --   ALT 22  --   --   --   ALKPHOS 41  --   --   --   BILITOT 0.7  --   --   --   GFRNONAA 21* 20* 26* 28*  ANIONGAP '9 14 6 7     '$ Cardiac Enzymes: Cardiac Panel (last 3 results) Recent Labs    08/27/22 0518 08/27/22 0819 08/28/22 0642  TROPONINIHS 59* 107* 56*    BNP (last 3 results) Recent Labs    11/12/21 1333 01/28/22 1500 08/27/22 1656  BNP 431.4* 611.6* 972.2*     ProBNP (last 3 results) No results for input(s): "PROBNP" in the last 8760 hours.   DDimer No results for input(s): "DDIMER" in the last 168 hours.   Hemoglobin A1c:  Lab Results  Component Value Date   HGBA1C 7.7 (H) 08/27/2022   MPG 174.29 08/27/2022    TSH  Recent Labs    08/27/22 0819  TSH 2.142    Lipid Panel     Component Value Date/Time   CHOL 109 08/29/2022 0229   TRIG 214 (H) 08/29/2022 0229   HDL 29 (L) 08/29/2022 0229   CHOLHDL 3.8 08/29/2022 0229   VLDL 43 (H) 08/29/2022 0229   LDLCALC 37 08/29/2022 0229   LDLDIRECT 51 08/29/2022 0229    Imaging: US RENAL  Result Date: 08/27/2022 CLINICAL DATA:  AKI. EXAM: RENAL / URINARY TRACT ULTRASOUND COMPLETE COMPARISON:  10/23/2021. FINDINGS: Right Kidney: Renal measurements: 9.0 x 4.5 x 4.8 cm = volume: 101.9 mL. Echogenicity within normal limits. No mass or hydronephrosis visualized. Left Kidney: Renal measurements: 10.0 x 4.8 x 6.6 cm = volume: 165.8 mL. Echogenicity within normal limits. A cyst with a thin septation is noted in the upper pole of the left kidney measuring 1.8 x 1.6 x 1.6 cm. No mass or hydronephrosis visualized. Bladder: Appears normal for degree of bladder distention. Bilateral ureteral jets are noted Other: None. IMPRESSION: 1. Septated cyst in the upper pole of the left kidney, slightly increased in size from the prior exam. 2. Otherwise normal exam. Electronically Signed   By: Brett Fairy M.D.   On: 08/27/2022 21:40    CARDIAC DATABASE: EKG: 08/27/2022:  NSR, 92 bpm, LBBB, ST-T changes likely secondary to BBB but underlying ischemia cannot be ruled out.   Normal sinus rhythm, 93 bpm, LBBB, ST-T changes likely secondary to BBB but underlying ischemia cannot be ruled out.   Normal sinus rhythm, 83 bpm, LBBB, ST-T changes likely secondary to BBB but underlying ischemia cannot be ruled out.   Echocardiogram: 08/27/2022:  1. Left ventricular ejection fraction, by estimation, is 25 to 30%.  The  left ventricle has severely decreased function. The left ventricle  demonstrates regional wall motion abnormalities (see scoring  diagram/findings for description). The left  ventricular internal cavity size was dilated. Left ventricular diastolic  parameters are consistent with Grade I diastolic dysfunction (impaired  relaxation).   2. Right ventricular systolic function is mildly reduced. The right  ventricular size is normal. There is normal pulmonary artery systolic  pressure.   3. A small pericardial effusion is present.   4. The mitral valve is grossly  normal. Mild mitral valve regurgitation.   5. The aortic valve is tricuspid. Aortic valve regurgitation is not  visualized. No aortic stenosis is present.   6. The inferior vena cava is normal in size with greater than 50%  respiratory variability, suggesting right atrial pressure of 3 mmHg.   11/26/2021:  Mildly depressed LV systolic function with visual EF 40-45%. Left  ventricle cavity is mildly dilated. Mild left ventricular hypertrophy.  Doppler evidence of grade I (impaired) diastolic dysfunction, normal LAP.  Left ventricle regional wall motion findings: Basal anteroseptal, Basal  anterior, Basal inferoseptal, Mid anteroseptal, Mid anterior, Mid  inferoseptal and Apical anterior hypokinesis.  Mild (Grade I) mitral regurgitation.  Mild pulmonic regurgitation.  Compared to study 07/08/2021 LVEF improved from 30-35% to 40-45% with RWMA,  Moderate MR is now mild, otherwise no significant change.    Stress Testing:  Lexiscan Tetrofosmin stress test 11/24/2020: Lexiscan nuclear stress test performed using 1-day protocol. Rest and stress EKG showed sinus rhythm, IVCD/tachycardia. Normal myocardial perfusion. Stress LVEF 25%. High risk study due to low stress LVEF.   Heart Catheterization: 08/21/2021 LV: 124/7, EDP 13 mmHg.  Ao 122/70, mean 92 mmHg.  No pressure gradient across the aortic valve. LM: Diffusely diseased  with occluded proximal LAD and proximal circumflex with minor branches evident. RCA: Occluded in the midsegment and severely diseased proximally. SVG to RCA: Proximal to mid segment has a ulcerated 80 to 90% stenosis.  Native vessel itself in the PL branch has a diffuse 90% stenosis and PDA has 60 to 70% stenosis.  This is unchanged from prior cardiac catheterization. Successful direct stenting with use of a spider filter wire for distal protection and implantation of a 4.0 x 16 mm Synergy XD at 16 atmospheric pressure, 60 seconds, stenosis reduced to 0%.  TIMI-3 to TIMI-3 flow. SVG to D1: Occluded. Free radial graft to OM1: Widely patent. LIMA to LAD: Widely patent.  There is mild to moderate disease in the native LAD which appears to have improved from prior cardiac catheterization.  Scheduled Meds:  aspirin EC  81 mg Oral QHS   busPIRone  20 mg Oral BID   famotidine  20 mg Oral QHS   insulin aspart  0-15 Units Subcutaneous TID WC   insulin detemir  18 Units Subcutaneous q AM   insulin detemir  20 Units Subcutaneous QPM   isosorbide mononitrate  60 mg Oral Daily   levothyroxine  88 mcg Oral Daily   metoprolol tartrate  50 mg Oral BID   ranolazine  500 mg Oral BID   rosuvastatin  40 mg Oral QHS   sodium chloride flush  3 mL Intravenous Q12H   ticagrelor  90 mg Oral BID    Continuous Infusions:  sodium chloride 50 mL/hr at 08/28/22 1939    PRN Meds: acetaminophen **OR** acetaminophen, albuterol, docusate sodium, meclizine, trimethobenzamide   IMPRESSION & RECOMMENDATIONS: Jesus Young is a 76 y.o. Caucasian male whose past medical history and cardiac risk factors include: CAD status post CABG in 2012, LBBB, HTN, IDDM, hyperlipidemia, chronic kidney disease, polycythemia, history of COVID-19 infection, advanced age.  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 EKG did not illustrate STEMI (confirmed w/ interventional cardiology).  Completed 48 hours of IV heparin. Echocardiogram during this hospitalization noted severely reduced LVEF with regional wall motion abnormality. High sensitive troponin peaked at 107 and a serum creatinine level of 3.09 mg/dL.  This is in comparison to his last non-STEMI in October 2022 with a high sensitive troponins were greater than 1000. Continue dual antiplatelet therapy, Imdur, metoprolol, Ranexa, and nitroglycerin tablets as needed. Pharmacy requesting Ranexa to be reduced to 500 mg p.o. twice daily due to renal function. LDL currently at goal. Triglyceride levels could be better controlled-patient would like to implement lifestyle changes prior to pharmacological therapy. Hemoglobin A1c could be better controlled. We will uptitrate GDMT (ACE inhibitors/ARB/ARNI/MRA/SGLT2i) once his renal function is more stable. We will check BNP and his weight prior to discharge. Diuretic management per nephrology. We will arrange outpatient follow-up and also refer him to [facility] for second opinion given his complex CAD and fragile kidney function.  With regards to his chronic systolic/diastolic heart failure he is overall euvolemic.  We will continue current medical therapy.  Diuretic management per nephrology.  We will uptitrate GDMT as discussed above once renal function stabilizes  Plan of care discussed with nursing staff, staff pharmacist, and attending physician during morning rounds.  Patient's questions and concerns were addressed to his satisfaction. He voices understanding of the instructions provided during this encounter.   This note was created using a voice  recognition software as a result there may be grammatical errors inadvertently enclosed that do not reflect the nature of this encounter. Every attempt is made to correct such errors.  Total time spent: 35 minutes.  Mechele Claude [facility]  Pager: [phone] Office: [phone] 08/29/2022, 2:15 PM

## 2022-08-29 NOTE — Progress Notes (Signed)
[name], male   DOB: November 11, 1946, 76 y.o.   MRN: [id number] S: Had some more chest pain while getting into bed after walking.  No N/V/diaphoresis, SOB O:BP 129/72   Pulse 84   Temp 97.7 F (36.5 C) (Oral)   Resp 18   Ht '6\' 3"'$  (1.905 m)   Wt 78.7 kg   SpO2 100%   BMI 21.69 kg/m   Intake/Output Summary (Last 24 hours) at 08/29/2022 1207 Last data filed at 08/29/2022 8341 Gross per 24 hour  Intake 1731.64 ml  Output 1525 ml  Net 206.64 ml   Intake/Output: I/O last 3 completed shifts: In: 2053.7 [P.O.:120; I.V.:1933.7] Out: 3125 [[id number]]  Intake/Output this shift:  No intake/output data recorded. Weight change:  Gen: NAD CVS:RRR Resp:CTA Abd: +BS, soft,NT/ND Ext: no edema  Recent Labs  Lab 08/23/22 1035 08/27/22 0309 08/28/22 0642 08/29/22 0229  NA 133* 133* 134* 135  K 3.3* 2.9* 3.4* 3.5  CL 92* 93* 103 106  CO2 32 '26 25 22  '$ GLUCOSE 202* 327* 104* 150*  BUN 47* 44* 34* 28*  CREATININE 2.97* 3.09* 2.51* 2.33*  ALBUMIN 4.3  --  3.2* 3.1*  CALCIUM 9.3 8.7* 8.5* 8.7*  PHOS  --   --  2.4* 2.1*  AST 22  --   --   --   ALT 22  --   --   --    Liver Function Tests: Recent Labs  Lab 08/23/22 1035 08/28/22 0642 08/29/22 0229  AST 22  --   --   ALT 22  --   --   ALKPHOS 41  --   --   BILITOT 0.7  --   --   PROT 7.4  --   --   ALBUMIN 4.3 3.2* 3.1*   No results for input(s): "LIPASE", "AMYLASE" in the last 168 hours. No results for input(s): "AMMONIA" in the last 168 hours. CBC: Recent Labs  Lab 08/23/22 1035 08/23/22 1035 08/27/22 0309 08/27/22 0819 08/28/22 0642 08/29/22 0229  WBC 5.9  --  5.4  --  5.6 5.8  NEUTROABS 4.3  --   --   --   --   --   HGB 14.3   < > 12.4* 11.9* 12.2* 12.1*  HCT 41.7  --  36.3* 35.0* 36.1* 35.4*  MCV 92.7  --  93.3  --  93.3 93.7  PLT 199  --  167  --  170 167   < > = values in this interval not displayed.   Cardiac Enzymes: No results for input(s): "CKTOTAL", "CKMB", "CKMBINDEX", "TROPONINI"  in the last 168 hours. CBG: Recent Labs  Lab 08/28/22 2135 08/29/22 0644 08/29/22 0710 08/29/22 0839 08/29/22 1134  GLUCAP 248* 63* 85 116* 149*    Iron Studies: No results for input(s): "IRON", "TIBC", "TRANSFERRIN", "FERRITIN" in the last 72 hours. Studies/Results: US RENAL  Result Date: 08/27/2022 CLINICAL DATA:  AKI. EXAM: RENAL / URINARY TRACT ULTRASOUND COMPLETE COMPARISON:  10/23/2021. FINDINGS: Right Kidney: Renal measurements: 9.0 x 4.5 x 4.8 cm = volume: 101.9 mL. Echogenicity within normal limits. No mass or hydronephrosis visualized. Left Kidney: Renal measurements: 10.0 x 4.8 x 6.6 cm = volume: 165.8 mL. Echogenicity within normal limits. A cyst with a thin septation is noted in the upper pole of the left kidney measuring 1.8 x 1.6 x 1.6 cm. No mass or hydronephrosis visualized. Bladder: Appears normal for degree of bladder distention. Bilateral ureteral jets are noted Other: None. IMPRESSION: 1.  Septated cyst in the upper pole of the left kidney, slightly increased in size from the prior exam. 2. Otherwise normal exam. Electronically Signed   By: Brett Fairy M.D.   On: 08/27/2022 21:40    aspirin EC  81 mg Oral QHS   busPIRone  20 mg Oral BID   famotidine  20 mg Oral QHS   insulin aspart  0-15 Units Subcutaneous TID WC   insulin detemir  18 Units Subcutaneous q AM   insulin detemir  20 Units Subcutaneous QPM   isosorbide mononitrate  60 mg Oral Daily   levothyroxine  88 mcg Oral Daily   metoprolol tartrate  50 mg Oral BID   ranolazine  500 mg Oral BID   rosuvastatin  40 mg Oral QHS   sodium chloride flush  3 mL Intravenous Q12H   ticagrelor  90 mg Oral BID    BMET    Component Value Date/Time   NA 135 08/29/2022 0229   NA 141 01/28/2022 1500   K 3.5 08/29/2022 0229   CL 106 08/29/2022 0229   CO2 22 08/29/2022 0229   GLUCOSE 150 (H) 08/29/2022 0229   BUN 28 (H) 08/29/2022 0229   BUN 26 01/28/2022 1500   CREATININE 2.33 (H) 08/29/2022 0229   CREATININE 2.97  (H) 08/23/2022 1035   CALCIUM 8.7 (L) 08/29/2022 0229   GFRNONAA 28 (L) 08/29/2022 0229   GFRNONAA 21 (L) 08/23/2022 1035   GFRAA 43 (L) 04/30/2020 1100   CBC    Component Value Date/Time   WBC 5.8 08/29/2022 0229   RBC 3.78 (L) 08/29/2022 0229   HGB 12.1 (L) 08/29/2022 0229   HGB 14.3 08/23/2022 1035   HCT 35.4 (L) 08/29/2022 0229   PLT 167 08/29/2022 0229   PLT 199 08/23/2022 1035   MCV 93.7 08/29/2022 0229   MCH 32.0 08/29/2022 0229   MCHC 34.2 08/29/2022 0229   RDW 13.5 08/29/2022 0229   LYMPHSABS 0.6 (L) 08/23/2022 1035   MONOABS 0.7 08/23/2022 1035   EOSABS 0.3 08/23/2022 1035   BASOSABS 0.0 08/23/2022 1035    Assessment/Plan:  AKI/CKD stage IIIb-IV - his baseline Scr has been 1.84-2.25, however has had multiple episodes of AKI related to diuretic use.  Agree with holding furosemide for now as Scr has already improved to 2.51 and today was 2.33.  Continue to follow BUN/Cr and UOP.  No indication for dialysis at this time.  We discussed different modalities of RRT including in-center HD, home HD, and PD.  He would be amenable for dialysis if needed.  Renal US negative for obstruction.  He is high risk for progressive CKD with IV contrast and understands.     Avoid nephrotoxic medications including NSAIDs and iodinated intravenous contrast exposure unless the latter is absolutely indicated.   Preferred narcotic agents for pain control are hydromorphone, fentanyl, and methadone. Morphine should not be used.  Avoid Baclofen and avoid oral sodium phosphate and magnesium citrate based laxatives / bowel preps.  Continue strict Input and Output monitoring. Will monitor the patient closely with you and intervene or adjust therapy as indicated by changes in clinical status/labs   NSTEMI - Currently chest pain free.  Known CAD s/p CABG involving grafts.  Cardiology following and plan for conservative therapy for now given AKI.  If heart cath is required, would wait until Scr gets below 2  and use IV NS protocol to help lower risk of CIN.  He would be willing to proceed with HD if  needed following heart cath.  Chronic diastolic and systolic CHF - currently euvolemic, however EF has dropped from 40-45% in January 2023 to 25-30% today.  He has Grade I diastolic dysfunction.  Hold diuretics as above and follow. Hypokalemia - replete and follow off of furosemide. HTN - stable on metoprolol. DM type 2 - per primary    Donetta Potts, MD [facility]

## 2022-08-29 NOTE — Plan of Care (Signed)

## 2022-08-29 NOTE — Plan of Care (Signed)

## 2022-08-30 LAB — GLUCOSE, CAPILLARY
Glucose-Capillary: 226 mg/dL — ABNORMAL HIGH (ref 70–99)
Glucose-Capillary: 98 mg/dL (ref 70–99)

## 2022-08-31 ENCOUNTER — Other Ambulatory Visit: Payer: Self-pay

## 2022-08-31 LAB — LIPOPROTEIN A (LPA): Lipoprotein (a): 13.1 nmol/L (ref <75.0)

## 2022-08-31 MED ORDER — ISOSORBIDE MONONITRATE ER 60 MG PO TB24: 60.0000 mg | ORAL_TABLET | Freq: Every day | ORAL | 3 refills | Status: DC

## 2022-08-31 MED ORDER — FUROSEMIDE 40 MG PO TABS: 40.0000 mg | ORAL_TABLET | Freq: Every day | ORAL | 3 refills | Status: DC

## 2022-08-31 NOTE — Progress Notes (Signed)
Called pt to inform him about his labs

## 2022-08-31 NOTE — Progress Notes (Signed)
Called and spoke with patient, confirming appointment on 09/06/22 with BS, NP

## 2022-09-01 ENCOUNTER — Encounter: Payer: Self-pay | Admitting: Cardiology

## 2022-09-01 DIAGNOSIS — I129 Hypertensive chronic kidney disease with stage 1 through stage 4 chronic kidney disease, or unspecified chronic kidney disease: Secondary | ICD-10-CM | POA: Diagnosis not present

## 2022-09-01 DIAGNOSIS — N1832 Chronic kidney disease, stage 3b: Secondary | ICD-10-CM | POA: Diagnosis not present

## 2022-09-01 DIAGNOSIS — D692 Other nonthrombocytopenic purpura: Secondary | ICD-10-CM | POA: Diagnosis not present

## 2022-09-01 DIAGNOSIS — I130 Hypertensive heart and chronic kidney disease with heart failure and stage 1 through stage 4 chronic kidney disease, or unspecified chronic kidney disease: Secondary | ICD-10-CM | POA: Diagnosis not present

## 2022-09-01 DIAGNOSIS — I5042 Chronic combined systolic (congestive) and diastolic (congestive) heart failure: Secondary | ICD-10-CM | POA: Diagnosis not present

## 2022-09-01 DIAGNOSIS — Z09 Encounter for follow-up examination after completed treatment for conditions other than malignant neoplasm: Secondary | ICD-10-CM | POA: Diagnosis not present

## 2022-09-01 DIAGNOSIS — J432 Centrilobular emphysema: Secondary | ICD-10-CM | POA: Diagnosis not present

## 2022-09-01 DIAGNOSIS — I25118 Atherosclerotic heart disease of native coronary artery with other forms of angina pectoris: Secondary | ICD-10-CM | POA: Diagnosis not present

## 2022-09-01 DIAGNOSIS — D751 Secondary polycythemia: Secondary | ICD-10-CM | POA: Diagnosis not present

## 2022-09-01 DIAGNOSIS — E1121 Type 2 diabetes mellitus with diabetic nephropathy: Secondary | ICD-10-CM | POA: Diagnosis not present

## 2022-09-01 DIAGNOSIS — E782 Mixed hyperlipidemia: Secondary | ICD-10-CM | POA: Diagnosis not present

## 2022-09-01 DIAGNOSIS — I700 Atherosclerosis of aorta: Secondary | ICD-10-CM | POA: Diagnosis not present

## 2022-09-06 ENCOUNTER — Ambulatory Visit: Payer: Medicare Other | Admitting: Cardiology

## 2022-09-06 ENCOUNTER — Encounter: Payer: Self-pay | Admitting: Cardiology

## 2022-09-06 VITALS — BP 128/64 | HR 85 | Temp 97.6°F | Resp 16 | Ht 75.0 in | Wt 174.0 lb

## 2022-09-06 DIAGNOSIS — I10 Essential (primary) hypertension: Secondary | ICD-10-CM

## 2022-09-06 DIAGNOSIS — I5022 Chronic systolic (congestive) heart failure: Secondary | ICD-10-CM | POA: Diagnosis not present

## 2022-09-06 DIAGNOSIS — I25708 Atherosclerosis of coronary artery bypass graft(s), unspecified, with other forms of angina pectoris: Secondary | ICD-10-CM

## 2022-09-06 DIAGNOSIS — N184 Chronic kidney disease, stage 4 (severe): Secondary | ICD-10-CM | POA: Diagnosis not present

## 2022-09-06 DIAGNOSIS — E782 Mixed hyperlipidemia: Secondary | ICD-10-CM | POA: Diagnosis not present

## 2022-09-06 DIAGNOSIS — I255 Ischemic cardiomyopathy: Secondary | ICD-10-CM

## 2022-09-06 DIAGNOSIS — I447 Left bundle-branch block, unspecified: Secondary | ICD-10-CM

## 2022-09-06 DIAGNOSIS — Z955 Presence of coronary angioplasty implant and graft: Secondary | ICD-10-CM

## 2022-09-06 MED ORDER — EMPAGLIFLOZIN 10 MG PO TABS: 10.0000 mg | ORAL_TABLET | Freq: Every day | ORAL | 0 refills | Status: DC

## 2022-09-06 NOTE — Progress Notes (Signed)
ID:  RHETT MUTSCHLER, DOB [birth year], MRN [id number]  PCP:  Merrilee Seashore, MD  Cardiologist:  Rex Kras, DO, [facility] (established care 08/26/2022) Former Cardiology Providers: Lawerance Cruel, PA Nephrologist: Dr. Marval Regal  Chief Complaint  Patient presents with   Coronary Artery Disease   Follow-up    HPI  Jesus Young is a 76 y.o. Caucasian male whose past medical history and cardiovascular risk factors include: CAD status post CABG in 2012, LBBB, HTN, IDDM, hyperlipidemia, chronic kidney disease, polycythemia, history of COVID-19 infection, advanced age.  Known history of CAD status post CABG in 2012.  Had an NSTEMI in October 2022 and during which time he underwent PCI to the SVG to the RCA graft.  Since then he has been managed medically.  At the last office visit patient was recommended to hold Lasix given the renal function.  Thereafter he presented to the ED with chest pain which was more severe than his baseline.  He was treated as NSTEMI with 48 hours of IV heparin, Lasix discontinued to preserve renal function, and maintenance fluids started.  His serum creatinine improved from 3.09 mg/dL on arrival to 2.33 mg/dL at the time of discharge.  His chest pain was well controlled on current medical therapy.  However, echocardiogram noted reduction in LVEF with regional wall motion abnormalities.  We discussed undergoing heart catheterization to evaluate for obstructive CAD; however, at the current renal function he would be at high risk for worsening CKD with progression to possible dialysis.  Shared decision was to treat him medically until unless if he has worsening chest pain to warrant emergent angiography.  Since discharge patient states that he has noticed improvement in his chest pain.  And he started to increase his physical activity he has been experiencing less chest pain at rest and with effort related activities.  His antianginal therapy  reviewed.  ALLERGIES: Allergies  Allergen Reactions   Clonazepam Other (See Comments)    Headache    Lisinopril Other (See Comments)    headache    MEDICATION LIST PRIOR TO VISIT: Current Meds  Medication Sig   acetaminophen (TYLENOL) 650 MG CR tablet Take 1,300 mg by mouth in the morning and at bedtime.   aspirin EC 81 MG tablet Take 81 mg by mouth at bedtime.    B-D ULTRAFINE III SHORT PEN 31G X 8 MM MISC USE UTD   busPIRone (BUSPAR) 10 MG tablet Take 20 mg by mouth 2 (two) times daily.   Calcium Citrate-Vitamin D 315-5 MG-MCG TABS Take 1 tablet by mouth daily at 12 noon.   Docusate Sodium (GENTLE STOOL SOFTENER PO) Take 600 mg by mouth daily.   empagliflozin (JARDIANCE) 10 MG TABS tablet Take 1 tablet (10 mg total) by mouth daily before breakfast.   famotidine (PEPCID) 20 MG tablet Take 1 tablet (20 mg total) by mouth at bedtime.   furosemide (LASIX) 40 MG tablet Take 1 tablet (40 mg total) by mouth daily.   insulin detemir (LEVEMIR) 100 UNIT/ML injection Inject 0.16 mLs (16 Units total) into the skin 2 (two) times daily. (Patient taking differently: Inject 20-25 Units into the skin See admin instructions. Inject 20 units into the skin in the morning and 25 units at night)   insulin lispro (HUMALOG) 100 UNIT/ML injection Inject 0.05-0.09 mLs (5-9 Units total) into the skin 2 (two) times daily. 5 units at lunch, and 9 units after dinner. (Patient taking differently: Inject 5-15 Units into the skin 2 (two) times daily. 5-15  units as needed  (sliding scale insulin))   isosorbide mononitrate (IMDUR) 60 MG 24 hr tablet Take 1 tablet (60 mg total) by mouth daily.   levothyroxine (SYNTHROID) 88 MCG tablet Take 88 mcg by mouth daily.   meclizine (ANTIVERT) 25 MG tablet Take 25 mg by mouth 3 (three) times daily as needed (for vertigo).   metoprolol succinate (TOPROL XL) 50 MG 24 hr tablet Take 1 tablet (50 mg total) by mouth daily. Take with or immediately following a meal.   Multiple  Vitamins-Minerals (CENTRUM SILVER) tablet Take 1 tablet by mouth daily.   nitroGLYCERIN (NITROSTAT) 0.4 MG SL tablet Place 1 tablet (0.4 mg total) under the tongue every 5 (five) minutes as needed for chest pain.   ONE TOUCH ULTRA TEST test strip    pantoprazole (PROTONIX) 40 MG tablet Take 40 mg by mouth at bedtime.    ranolazine (RANEXA) 500 MG 12 hr tablet Take 1 tablet (500 mg total) by mouth 2 (two) times daily.   rosuvastatin (CRESTOR) 40 MG tablet Take 40 mg by mouth at bedtime.   ticagrelor (BRILINTA) 90 MG TABS tablet TAKE 1 TABLET(90 MG) BY MOUTH TWICE DAILY (Patient taking differently: Take 90 mg by mouth 2 (two) times daily.)     PAST MEDICAL HISTORY: Past Medical History:  Diagnosis Date   Anxiety    Arthritis    back and knees   Bronchitis    hx of 2015   Cancer ([place])    prostate   Cataract    immature on right   Chronic back pain    CKD (chronic kidney disease)    Constipation    takes Colace at bedtime   Coronary artery disease    takes Plavix daily but is on hold for surgery   Depression    Diabetes mellitus without complication (HCC)    takes Levemir and Humalog daily   Dizziness    Fall    hx of with injury to left shoulder    GERD (gastroesophageal reflux disease)    takes Protonix daily and Zantac   H/O urinary frequency    Hemorrhoids    History of bladder infections    History of kidney stones    Hyperlipidemia    takes Zocor every evening   Hypertension    takes Metoprolol daily   Hypothyroidism    takes Synthroid daily   Left bundle branch block    Myocardial infarction ([place])    many yrs before 2002   Night muscle spasms    takes Robaxin 4 x day    Nocturia     PAST SURGICAL HISTORY: Past Surgical History:  Procedure Laterality Date   CARDIAC CATHETERIZATION  05/17/02   COLONOSCOPY     CORONARY ANGIOPLASTY     x 1    CORONARY ARTERY BYPASS GRAFT  2002   x 5   eyelid surgery      INGUINAL HERNIA REPAIR Left 05/06/2020   Procedure:  LEFT INGUINAL HERNIA REPAIR;  Surgeon: Donnie Mesa, MD;  Location: [place];  Service: General;  Laterality: Left;   INSERTION OF MESH Left 05/06/2020   Procedure: INSERTION OF MESH;  Surgeon: Donnie Mesa, MD;  Location: [place];  Service: General;  Laterality: Left;   LEFT HEART CATH AND CORS/GRAFTS ANGIOGRAPHY N/A 12/05/2018   Procedure: LEFT HEART CATH AND CORS/GRAFTS ANGIOGRAPHY;  Surgeon: Adrian Prows, MD;  Location: [place] [facility];  Service: Cardiovascular;  Laterality: N/A;   LEFT HEART CATH AND CORS/GRAFTS  ANGIOGRAPHY N/A 08/21/2021   Procedure: LEFT HEART CATH AND CORS/GRAFTS ANGIOGRAPHY;  Surgeon: Adrian Prows, MD;  Location: [place] [facility];  Service: Cardiovascular;  Laterality: N/A;   LUMBAR LAMINECTOMY/DECOMPRESSION MICRODISCECTOMY Bilateral 07/18/2013   Procedure: Bilateral Lumbar four-five Lumbar Laminotomy/Microdiskectomy;  Surgeon: Hosie Spangle, MD;  Location: [place] [facility];  Service: Neurosurgery;  Laterality: Bilateral;  Bilateral Lumbar four-five Lumbar Laminotomy/Microdiskectomy   NEPHROLITHOTOMY Left 06/23/2015   Procedure: NEPHROLITHOTOMY PERCUTANEOUS;  Surgeon: [name], MD;  Location: [facility];  Service: Urology;  Laterality: Left;   PROSTATECTOMY  10/2009   skin spots removed from back -precancerous     TONSILLECTOMY      FAMILY HISTORY: The patient family history includes Heart attack in his father; Heart disease in his father; Thyroid disease in his sister.  SOCIAL HISTORY:  The patient  reports that he has never smoked. He has never used smokeless tobacco. He reports that he does not drink alcohol and does not use drugs.  REVIEW OF SYSTEMS: Review of Systems  Cardiovascular:  Positive for chest pain (Known history of stable angina). Negative for claudication, dyspnea on exertion, irregular heartbeat, leg swelling, near-syncope, orthopnea, palpitations, paroxysmal nocturnal dyspnea and syncope.  Respiratory:  Negative for shortness of breath.    Hematologic/Lymphatic: Negative for bleeding problem.  Musculoskeletal:  Negative for muscle cramps and myalgias.  Neurological:  Negative for dizziness and light-headedness.   PHYSICAL EXAM:    09/06/2022    2:45 PM 08/29/2022    3:00 PM 08/29/2022    8:53 AM  Vitals with BMI  Height '6\' 3"'$     Weight 174 lbs 173 lbs 5 oz   BMI 61.44 31.54   Systolic 008  676  Diastolic 64  72  Pulse 85  84    Physical Exam  Constitutional: No distress.  Age appropriate, hemodynamically stable.   Neck: No JVD present.  Cardiovascular: Normal rate, regular rhythm, S1 normal, S2 normal, intact distal pulses and normal pulses. Exam reveals no gallop, no S3 and no S4.  No murmur heard. Pulses:      Dorsalis pedis pulses are 2+ on the right side and 2+ on the left side.       Posterior tibial pulses are 2+ on the right side and 2+ on the left side.  Pulmonary/Chest: Effort normal and breath sounds normal. No stridor. He has no wheezes. He has no rales.  Abdominal: Soft. Bowel sounds are normal. He exhibits no distension. There is no abdominal tenderness.  Musculoskeletal:        General: No edema.     Cervical back: Neck supple.  Neurological: He is alert and oriented to person, place, and time. He has intact cranial nerves (2-12).  Skin: Skin is warm and moist.   CARDIAC DATABASE: EKG: 09/06/2022: Sinus rhythm, 84 bpm, left bundle branch block, left axis, ST-T changes likely secondary to underlying LBBB but ischemia cannot be ruled out.  Echocardiogram: 08/27/2022:  1. Left ventricular ejection fraction, by estimation, is 25 to 30%. The left ventricle has severely decreased function. The left ventricle demonstrates regional wall motion abnormalities (see scoring diagram/findings for description). The left ventricular internal cavity size was dilated. Left ventricular diastolic parameters are consistent with Grade I diastolic dysfunction (impaired relaxation).   2. Right ventricular systolic  function is mildly reduced. The right ventricular size is normal. There is normal pulmonary artery systolic pressure.   3. A small pericardial effusion is present.   4. The mitral valve is grossly normal.  Mild mitral valve regurgitation.   5. The aortic valve is tricuspid. Aortic valve regurgitation is not visualized. No aortic stenosis is present.   6. The inferior vena cava is normal in size with greater than 50% respiratory variability, suggesting right atrial pressure of 3 mmHg.   11/26/2021: Mildly depressed LV systolic function with visual EF 40-45%. Left ventricle cavity is mildly dilated. Mild left ventricular hypertrophy. Doppler evidence of grade I (impaired) diastolic dysfunction, normal LAP. Left ventricle regional wall motion findings: Basal anteroseptal, Basal anterior, Basal inferoseptal, Mid anteroseptal, Mid anterior, Mid inferoseptal and Apical anterior hypokinesis. Mild (Grade I) mitral regurgitation. Mild pulmonic regurgitation. Compared to study 07/08/2021 LVEF improved from 30-35% to 40-45% with RWMA, Moderate MR is now mild, otherwise no significant change.  Stress Testing: Lexiscan Tetrofosmin stress test 11/24/2020: Lexiscan nuclear stress test performed using 1-day protocol. Rest and stress EKG showed sinus rhythm, IVCD/tachycardia. Normal myocardial perfusion. Stress LVEF 25%. High risk study due to low stress LVEF.  Heart Catheterization: 08/21/2021 LV: 124/7, EDP 13 mmHg.  Ao 122/70, mean 92 mmHg.  No pressure gradient across the aortic valve. LM: Diffusely diseased with occluded proximal LAD and proximal circumflex with minor branches evident. RCA: Occluded in the midsegment and severely diseased proximally. SVG to RCA: Proximal to mid segment has a ulcerated 80 to 90% stenosis.  Native vessel itself in the PL branch has a diffuse 90% stenosis and PDA has 60 to 70% stenosis.  This is unchanged from prior cardiac catheterization. Successful direct stenting with use  of a spider filter wire for distal protection and implantation of a 4.0 x 16 mm Synergy XD at 16 atmospheric pressure, 60 seconds, stenosis reduced to 0%.  TIMI-3 to TIMI-3 flow. SVG to D1: Occluded. Free radial graft to OM1: Widely patent. LIMA to LAD: Widely patent.  There is mild to moderate disease in the native LAD which appears to have improved from prior cardiac catheterization.  Recommendation: Patient will be placed on dual antiplatelet therapy.  He will need to be on diuretics in the morning, please start Carrington Clamp next week once renal function is stable.  He can potentially go home either tomorrow if renal function is stable and he is hemodynamically stable.  60 mL contrast utilized.  LABORATORY DATA:    Latest Ref Rng & Units 08/29/2022    2:29 AM 08/28/2022    6:42 AM 08/27/2022    8:19 AM  CBC  WBC 4.0 - 10.5 K/uL 5.8  5.6    Hemoglobin 13.0 - 17.0 g/dL 12.1  12.2  11.9   Hematocrit 39.0 - 52.0 % 35.4  36.1  35.0   Platelets 150 - 400 K/uL 167  170         Latest Ref Rng & Units 08/29/2022    2:29 AM 08/28/2022    6:42 AM 08/27/2022    3:09 AM  CMP  Glucose 70 - 99 mg/dL 150  104  327   BUN 8 - 23 mg/dL 28  34  44   Creatinine 0.61 - 1.24 mg/dL 2.33  2.51  3.09   Sodium 135 - 145 mmol/L 135  134  133   Potassium 3.5 - 5.1 mmol/L 3.5  3.4  2.9   Chloride 98 - 111 mmol/L 106  103  93   CO2 22 - 32 mmol/L '22  25  26   '$ Calcium 8.9 - 10.3 mg/dL 8.7  8.5  8.7     Lipid Panel     Component Value  Date/Time   CHOL 109 08/29/2022 0229   TRIG 214 (H) 08/29/2022 0229   HDL 29 (L) 08/29/2022 0229   CHOLHDL 3.8 08/29/2022 0229   VLDL 43 (H) 08/29/2022 0229   LDLCALC 37 08/29/2022 0229   LDLDIRECT 51 08/29/2022 0229    No components found for: "NTPROBNP" No results for input(s): "PROBNP" in the last 8760 hours. Recent Labs    08/27/22 0819  TSH 2.142    BMP Recent Labs    08/27/22 0309 08/28/22 0642 08/29/22 0229  NA 133* 134* 135  K 2.9* 3.4* 3.5  CL 93*  103 106  CO2 '26 25 22  '$ GLUCOSE 327* 104* 150*  BUN 44* 34* 28*  CREATININE 3.09* 2.51* 2.33*  CALCIUM 8.7* 8.5* 8.7*  GFRNONAA 20* 26* 28*    HEMOGLOBIN A1C Lab Results  Component Value Date   HGBA1C 7.7 (H) 08/27/2022   MPG 174.29 08/27/2022   External Labs: Collected: 08/26/2022 provided by the patient performed at [facility] AST 24, ALT 33, alkaline phosphatase 46. BUN 40, creatinine 2.79. BUN/creatinine ratio 14.3. Chloride 95  External Labs: Office note dated 09/01/2022 by PCP noted in Care Everywhere  Sodium 137 136-145 mEq/L    Potassium 4.0 3.5-5.1 mEq/L    Chloride 100 98-107 mEq/L    CO2 29 21-32 mmol/L    Glucose 127 74-106 mg/dL    BUN 24 7-18 mg/dL    Creatinine 2.26 0.70-1.30 mg/dL    Calcium 9.4 8.5-10.1 mg/dL    BUN/Creatinine Ratio 10.6 11.0-26.0 Ratio    GFR/Black 31 >59 mL/min/1.75m    GFR/White 27 >59 mL/min/1.729m    IMPRESSION:    ICD-10-CM   1. Chronic HFrEF (heart failure with reduced ejection fraction) (HCC)  I50.22 EKG 12-Lead    empagliflozin (JARDIANCE) 10 MG TABS tablet    Basic metabolic panel    Magnesium    Pro b natriuretic peptide (BNP)    2. Ischemic cardiomyopathy  I25.5     3. Coronary artery disease of bypass graft of native heart with stable angina pectoris (HCC)  I25.708     4. Status post insertion of drug eluting coronary artery stent  Z95.5     5. Mixed hyperlipidemia  E78.2     6. LBBB (left bundle branch block)  I44.7     7. Benign hypertension  I10        RECOMMENDATIONS: DoRANA ADORNOs a [age].o. Caucasian male whose past medical history and cardiac risk factors include: CAD status post CABG in 2012, LBBB, HTN, IDDM, hyperlipidemia, chronic kidney disease, polycythemia, history of COVID-19 infection, advanced age.  Chronic HFrEF (heart failure with reduced ejection fraction) (HCC) /ischemic cardiomyopathy Stage C, NYHA class II Clinically euvolemic. Recent echocardiogram noted estimated RAP 3 mmHg and  grade 1 diastolic impairment -which is reassuring. Medications reconciled. Currently on Lasix 40 mg p.o. daily.  We will start Jardiance 10 mg p.o. daily -I have asked him to discuss this with his nephrologist prior to starting the medication.  And he will likely started after his trip (will be back in town on 09/22/2022).  Ideally, would like to uptitrate GDMT with close monitoring of renal function. Currently not on ACE inhibitor/ARB/ARNI/MRA/ likely due to underlying renal function.  History of CABG with stable angina pectoris /status post PCI SVG to RCA  Currently denies active chest pain. ECG similar to prior tracings. Antianginal therapies include beta-blockers, Imdur, Ranexa. The dose of Ranexa had to be reduced during his recent hospitalization in  October 2023 due to underlying CKD. Since he is not having active chest pain and his underlying discomfort is consistent with stable angina we discussed being considered for enhanced extracorporeal counterpulsation (EECP) therapy either at [facility] or [facility] [place]. This is a FDA approved and Medicare covered treatment.  Patient states that he will discuss it further with his wife, review the literature online, will reach out to his insurance company with regards to cost, and make an informed decision. Patient is aware of the EECP would be consider adjunctive to his antianginal therapy and GDMT.  If he has acute chest pain similar to his prior events he is asked to go to the closest ER via EMS for further evaluation and management.  If he presents with ACS angiography will need to be reconsidered irrespective of the renal function (patient is more aware).  Until then he would like to preserve his renal function as much as possible and hold off angiography.  We will follow him closely.   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 Start Corinne as discussed above. With close monitoring of  renal function.  Follow-up with nephrology.  Mixed hyperlipidemia Currently on rosuvastatin.   He denies myalgia or other side effects.  FINAL MEDICATION LIST END OF ENCOUNTER: Meds ordered this encounter  Medications   empagliflozin (JARDIANCE) 10 MG TABS tablet    Sig: Take 1 tablet (10 mg total) by mouth daily before breakfast.    Dispense:  90 tablet    Refill:  0    There are no discontinued medications.    Current Outpatient Medications:    acetaminophen (TYLENOL) 650 MG CR tablet, Take 1,300 mg by mouth in the morning and at bedtime., Disp: , Rfl:    aspirin EC 81 MG tablet, Take 81 mg by mouth at bedtime. , Disp: , Rfl:    B-D ULTRAFINE III SHORT PEN 31G X 8 MM MISC, USE UTD, Disp: , Rfl: 0   busPIRone (BUSPAR) 10 MG tablet, Take 20 mg by mouth 2 (two) times daily., Disp: , Rfl:    Calcium Citrate-Vitamin D 315-5 MG-MCG TABS, Take 1 tablet by mouth daily at 12 noon., Disp: , Rfl:    Docusate Sodium (GENTLE STOOL SOFTENER PO), Take 600 mg by mouth daily., Disp: , Rfl:    empagliflozin (JARDIANCE) 10 MG TABS tablet, Take 1 tablet (10 mg total) by mouth daily before breakfast., Disp: 90 tablet, Rfl: 0   famotidine (PEPCID) 20 MG tablet, Take 1 tablet (20 mg total) by mouth at bedtime., Disp: 30 tablet, Rfl: 0   furosemide (LASIX) 40 MG tablet, Take 1 tablet (40 mg total) by mouth daily., Disp: 30 tablet, Rfl: 3   insulin detemir (LEVEMIR) 100 UNIT/ML injection, Inject 0.16 mLs (16 Units total) into the skin 2 (two) times daily. (Patient taking differently: Inject 20-25 Units into the skin See admin instructions. Inject 20 units into the skin in the morning and 25 units at night), Disp: 10 mL, Rfl: 1   insulin lispro (HUMALOG) 100 UNIT/ML injection, Inject 0.05-0.09 mLs (5-9 Units total) into the skin 2 (two) times daily. 5 units at lunch, and 9 units after dinner. (Patient taking differently: Inject 5-15 Units into the skin 2 (two) times daily. 5-15 units as needed  (sliding scale  insulin)), Disp: 10 mL, Rfl: 1   isosorbide mononitrate (IMDUR) 60 MG 24 hr tablet, Take 1 tablet (60 mg total) by mouth daily., Disp: 30 tablet, Rfl: 3  levothyroxine (SYNTHROID) 88 MCG tablet, Take 88 mcg by mouth daily., Disp: , Rfl:    meclizine (ANTIVERT) 25 MG tablet, Take 25 mg by mouth 3 (three) times daily as needed (for vertigo)., Disp: , Rfl:    metoprolol succinate (TOPROL XL) 50 MG 24 hr tablet, Take 1 tablet (50 mg total) by mouth daily. Take with or immediately following a meal., Disp: 30 tablet, Rfl: 0   Multiple Vitamins-Minerals (CENTRUM SILVER) tablet, Take 1 tablet by mouth daily., Disp: , Rfl:    nitroGLYCERIN (NITROSTAT) 0.4 MG SL tablet, Place 1 tablet (0.4 mg total) under the tongue every 5 (five) minutes as needed for chest pain., Disp: 15 tablet, Rfl: 3   ONE TOUCH ULTRA TEST test strip, , Disp: , Rfl:    pantoprazole (PROTONIX) 40 MG tablet, Take 40 mg by mouth at bedtime. , Disp: , Rfl:    ranolazine (RANEXA) 500 MG 12 hr tablet, Take 1 tablet (500 mg total) by mouth 2 (two) times daily., Disp: 60 tablet, Rfl: 0   rosuvastatin (CRESTOR) 40 MG tablet, Take 40 mg by mouth at bedtime., Disp: , Rfl:    ticagrelor (BRILINTA) 90 MG TABS tablet, TAKE 1 TABLET(90 MG) BY MOUTH TWICE DAILY (Patient taking differently: Take 90 mg by mouth 2 (two) times daily.), Disp: 60 tablet, Rfl: 3  Orders Placed This Encounter  Procedures   Basic metabolic panel   Magnesium   Pro b natriuretic peptide (BNP)   EKG 12-Lead    There are no Patient Instructions on file for this visit.   --Continue cardiac medications as reconciled in final medication list. --Return in about 7 weeks (around 10/25/2022) for Follow up, CAD, heart failure management.. or sooner if needed. --Continue follow-up with your primary care physician regarding the management of your other chronic comorbid conditions.  Patient's questions and concerns were addressed to his satisfaction. He voices understanding of the  instructions provided during this encounter.   This note was created using a voice recognition software as a result there may be grammatical errors inadvertently enclosed that do not reflect the nature of this encounter. Every attempt is made to correct such errors.  Rex Kras, [place], [facility]  Pager: [phone] Office: [phone]

## 2022-09-10 ENCOUNTER — Telehealth: Payer: Self-pay | Admitting: Cardiology

## 2022-09-10 NOTE — Telephone Encounter (Signed)
Patient says he recently found information about Dr. Carlisle Cater, and is asking about a referral to his office and records. He wants someone to call him back to discuss in more detail.

## 2022-09-10 NOTE — Telephone Encounter (Signed)
I tried calling patient no answer

## 2022-09-10 NOTE — Telephone Encounter (Signed)
Lyndon Code, please call Mr. Norville and tirage his call.  I will be happy to place a referral for Dr. Sharlet Salina.   Dr. Terri Skains

## 2022-09-13 ENCOUNTER — Telehealth: Payer: Self-pay

## 2022-09-13 ENCOUNTER — Other Ambulatory Visit: Payer: Self-pay

## 2022-09-13 DIAGNOSIS — I25708 Atherosclerosis of coronary artery bypass graft(s), unspecified, with other forms of angina pectoris: Secondary | ICD-10-CM

## 2022-09-13 MED ORDER — NITROGLYCERIN 0.4 MG SL SUBL: 0.4000 mg | SUBLINGUAL_TABLET | SUBLINGUAL | 3 refills | Status: DC | PRN

## 2022-09-13 NOTE — Telephone Encounter (Signed)
Patient asked for a referral to DrSharlet Salina at [facility] and also asked we fax his medical records to this office [phone] attn marisol.

## 2022-09-14 ENCOUNTER — Ambulatory Visit: Payer: Medicare Other | Admitting: Cardiology

## 2022-09-14 NOTE — Telephone Encounter (Signed)
Dorethea Clan will place a consult for Dr. Carlisle Cater at [facility]  RE: CAD w/ angina (2nd opinion) and consider EECP tx.   Rex Kras, [place], [facility]  Pager: [phone] Office: [phone]

## 2022-09-15 NOTE — Telephone Encounter (Signed)
Called and spoke to patient he stated he got a hold of Dr. Nathanial Millman office and was told he is currently on leave and no one knows when he is coming back but was told that he could go ahead and send his referral for when Dr. Sharlet Salina gets back in they can have all his stuff ready and schedule him to see Dr. Sharlet Salina. Patient gave me their fax number [phone] patient has been talking to [facility] in their office.

## 2022-09-15 NOTE — Telephone Encounter (Signed)
Please speak to Dorethea Clan - she did send out the referral.   Dr. Terri Skains

## 2022-09-15 NOTE — Telephone Encounter (Signed)
Please see messages above

## 2022-09-16 NOTE — Telephone Encounter (Signed)
Called and spoke to patient to make him aware referral has been sent

## 2022-09-22 DIAGNOSIS — S0180XA Unspecified open wound of other part of head, initial encounter: Secondary | ICD-10-CM | POA: Diagnosis not present

## 2022-09-22 DIAGNOSIS — I10 Essential (primary) hypertension: Secondary | ICD-10-CM | POA: Diagnosis not present

## 2022-09-22 DIAGNOSIS — S01402A Unspecified open wound of left cheek and temporomandibular area, initial encounter: Secondary | ICD-10-CM | POA: Diagnosis not present

## 2022-09-22 DIAGNOSIS — E785 Hyperlipidemia, unspecified: Secondary | ICD-10-CM | POA: Diagnosis not present

## 2022-09-22 DIAGNOSIS — I252 Old myocardial infarction: Secondary | ICD-10-CM | POA: Diagnosis not present

## 2022-09-22 DIAGNOSIS — E119 Type 2 diabetes mellitus without complications: Secondary | ICD-10-CM | POA: Diagnosis not present

## 2022-09-28 ENCOUNTER — Telehealth: Payer: Self-pay

## 2022-09-28 ENCOUNTER — Other Ambulatory Visit: Payer: Self-pay

## 2022-09-28 DIAGNOSIS — I25118 Atherosclerotic heart disease of native coronary artery with other forms of angina pectoris: Secondary | ICD-10-CM

## 2022-09-28 MED ORDER — RANOLAZINE ER 500 MG PO TB12: 500.0000 mg | ORAL_TABLET | Freq: Two times a day (BID) | ORAL | 1 refills | Status: DC

## 2022-09-28 MED ORDER — FAMOTIDINE 20 MG PO TABS: 20.0000 mg | ORAL_TABLET | Freq: Every day | ORAL | 1 refills | Status: DC

## 2022-09-28 MED ORDER — METOPROLOL SUCCINATE ER 50 MG PO TB24: 50.0000 mg | ORAL_TABLET | Freq: Every day | ORAL | 1 refills | Status: DC

## 2022-09-28 NOTE — Telephone Encounter (Signed)
Patient called in asking for refills on medications prescribed by Dr. Lamount Cohen, Hospitalist, while the patient was admitted. Advised the patient we do not handle calls for the hospitalists, and that they typically do not refill any medications. His discharge summary should have recommended he follow up with his normal cardiologist at [facility], and this is where he would obtain further assistance with his medications such as refills. He became very upset and angry that I would not let him speak with Dr. Lamount Cohen. I reiterated we do not handle calls for the hospitalists and that he is not a current patient here in [place], therefore he would need to contact [facility] with his questions. He was still very angry and stated "this is the worst service from a medical provider I have ever received", however he did state Dr. Lamount Cohen "seemed like a very upstanding gentleman". I thanked him for that, and again advised he contact his cardiologist. He proceeded to disconnect the call.

## 2022-09-30 ENCOUNTER — Other Ambulatory Visit: Payer: Self-pay | Admitting: Cardiology

## 2022-09-30 DIAGNOSIS — H43811 Vitreous degeneration, right eye: Secondary | ICD-10-CM | POA: Diagnosis not present

## 2022-09-30 DIAGNOSIS — I5022 Chronic systolic (congestive) heart failure: Secondary | ICD-10-CM | POA: Diagnosis not present

## 2022-10-01 LAB — BASIC METABOLIC PANEL
BUN/Creatinine Ratio: 16 (ref 10–24)
BUN: 38 mg/dL — ABNORMAL HIGH (ref 8–27)
CO2: 25 mmol/L (ref 20–29)
Calcium: 9.5 mg/dL (ref 8.6–10.2)
Chloride: 97 mmol/L (ref 96–106)
Creatinine, Ser: 2.38 mg/dL — ABNORMAL HIGH (ref 0.76–1.27)
Glucose: 119 mg/dL — ABNORMAL HIGH (ref 70–99)
Potassium: 3.3 mmol/L — ABNORMAL LOW (ref 3.5–5.2)
Sodium: 138 mmol/L (ref 134–144)
eGFR: 28 mL/min/{1.73_m2} — ABNORMAL LOW (ref >59)

## 2022-10-01 LAB — MAGNESIUM: Magnesium: 2.2 mg/dL (ref 1.6–2.3)

## 2022-10-01 LAB — PRO B NATRIURETIC PEPTIDE: NT-Pro BNP: 5409 pg/mL — ABNORMAL HIGH (ref 0–486)

## 2022-10-05 DIAGNOSIS — H43811 Vitreous degeneration, right eye: Secondary | ICD-10-CM | POA: Diagnosis not present

## 2022-10-06 ENCOUNTER — Other Ambulatory Visit: Payer: Self-pay

## 2022-10-06 MED ORDER — SPIRONOLACTONE 25 MG PO TABS: 12.5000 mg | ORAL_TABLET | Freq: Every day | ORAL | 0 refills | Status: DC

## 2022-10-06 NOTE — Progress Notes (Signed)
Called and spoke with patient regarding his recent lab results. Patient is aware he will be starting Aldactone 12.'5mg'$  daily and to go have labs drawn 1  week after starting. I will medication to his pharmacy.

## 2022-10-14 ENCOUNTER — Other Ambulatory Visit: Payer: Self-pay | Admitting: Cardiology

## 2022-10-14 ENCOUNTER — Encounter: Payer: Self-pay | Admitting: Podiatry

## 2022-10-14 ENCOUNTER — Ambulatory Visit: Payer: Medicare Other | Admitting: Podiatry

## 2022-10-14 DIAGNOSIS — D2372 Other benign neoplasm of skin of left lower limb, including hip: Secondary | ICD-10-CM

## 2022-10-14 DIAGNOSIS — D2371 Other benign neoplasm of skin of right lower limb, including hip: Secondary | ICD-10-CM | POA: Diagnosis not present

## 2022-10-14 DIAGNOSIS — M79676 Pain in unspecified toe(s): Secondary | ICD-10-CM | POA: Diagnosis not present

## 2022-10-14 DIAGNOSIS — D689 Coagulation defect, unspecified: Secondary | ICD-10-CM

## 2022-10-14 DIAGNOSIS — E119 Type 2 diabetes mellitus without complications: Secondary | ICD-10-CM

## 2022-10-14 DIAGNOSIS — E876 Hypokalemia: Secondary | ICD-10-CM

## 2022-10-14 DIAGNOSIS — I5022 Chronic systolic (congestive) heart failure: Secondary | ICD-10-CM

## 2022-10-14 DIAGNOSIS — I25118 Atherosclerotic heart disease of native coronary artery with other forms of angina pectoris: Secondary | ICD-10-CM

## 2022-10-14 DIAGNOSIS — B351 Tinea unguium: Secondary | ICD-10-CM

## 2022-10-14 NOTE — Progress Notes (Signed)
Presents today chief complaint of painful elongated toenails and calluses bilaterally.  He denies fever chills nausea vomiting muscle aches and pains states that he is recently had another heart attack that he was unable to have any type of angio for because of his kidney dysfunction.  Objective: Vital signs stable he is alert oriented x 3 pulses are moderately palpable bilateral.  Mild hammertoe deformities bilateral toenails are long thick yellow dystrophic with mycotic multiple benign skin lesions plantar aspect of the bilateral foot.  Assessment: Pain limb secondary to onychomycosis and benign skin lesions.  Plan: Debridement of onychomycosis and benign skin lesions.  Follow-up with [place] on an as-needed basis or in 3 months.

## 2022-10-15 DIAGNOSIS — I5022 Chronic systolic (congestive) heart failure: Secondary | ICD-10-CM | POA: Diagnosis not present

## 2022-10-15 NOTE — Progress Notes (Signed)
Called patient, NA, no VM-box to leave a message.

## 2022-10-16 ENCOUNTER — Other Ambulatory Visit: Payer: Self-pay | Admitting: Cardiology

## 2022-10-16 DIAGNOSIS — I5022 Chronic systolic (congestive) heart failure: Secondary | ICD-10-CM

## 2022-10-16 DIAGNOSIS — E876 Hypokalemia: Secondary | ICD-10-CM

## 2022-10-16 LAB — BASIC METABOLIC PANEL
BUN/Creatinine Ratio: 14 (ref 10–24)
BUN: 39 mg/dL — ABNORMAL HIGH (ref 8–27)
CO2: 24 mmol/L (ref 20–29)
Calcium: 8.9 mg/dL (ref 8.6–10.2)
Chloride: 95 mmol/L — ABNORMAL LOW (ref 96–106)
Creatinine, Ser: 2.71 mg/dL — ABNORMAL HIGH (ref 0.76–1.27)
Glucose: 212 mg/dL — ABNORMAL HIGH (ref 70–99)
Potassium: 3.2 mmol/L — ABNORMAL LOW (ref 3.5–5.2)
Sodium: 136 mmol/L (ref 134–144)
eGFR: 24 mL/min/{1.73_m2} — ABNORMAL LOW (ref >59)

## 2022-10-16 LAB — MAGNESIUM: Magnesium: 2.2 mg/dL (ref 1.6–2.3)

## 2022-10-16 LAB — PRO B NATRIURETIC PEPTIDE: NT-Pro BNP: 4725 pg/mL — ABNORMAL HIGH (ref 0–486)

## 2022-10-16 MED ORDER — ISOSORBIDE MONONITRATE ER 60 MG PO TB24: 60.0000 mg | ORAL_TABLET | Freq: Every day | ORAL | 3 refills | Status: DC

## 2022-10-16 MED ORDER — POTASSIUM CHLORIDE CRYS ER 20 MEQ PO TBCR: 20.0000 meq | EXTENDED_RELEASE_TABLET | Freq: Two times a day (BID) | ORAL | 0 refills | Status: DC

## 2022-10-16 NOTE — Progress Notes (Signed)
ON-CALL CARDIOLOGY 10/16/22  Patient's name: Jesus Young.   MRN: [id number].    DOB: 04-Aug-1946 Primary care provider: Merrilee Seashore, MD. Primary cardiologist: Rex Kras, DO, [facility]  Interaction regarding this patient's care today: Spoke to the patient over the phone regarding the labs from 10/15/2022.  Currently is taking Jardiance, Lasix 40 mg twice daily, spironolactone 12.5 mg p.o. daily.  Along with Ranexa, aspirin, Brilinta, Toprol-XL.  Patient should be on Imdur-does not have a prescription.  Clinically patient states that " I been feeling better than I have felt in the last 25 years."  However, intermittently continues to have stable chest pain and arm pain predominantly at night.  Overall intensity frequency and duration have not worsened.  Impression:   ICD-10-CM   1. Chronic HFrEF (heart failure with reduced ejection fraction) (HCC)  I50.22 Pro b natriuretic peptide (BNP)    Magnesium    Basic metabolic panel      No orders of the defined types were placed in this encounter.   No orders of the defined types were placed in this encounter.   Recommendations: Hypokalemia -start K-Dur 20 mEq p.o. bid Will send a prescription for Imdur 60 mg p.o. daily.  Continue spironolactone 12.5 mg p.o. daily.  Decrease Lasix to 40 mg p.o. BID to QDAY.  Labs in 2 weeks to reevaluate potassium, renal function, NT proBNP.  Compression stockings recommended bilaterally Educated on importance of glycemic control   Telephone encounter total time: 10 minutes.   Rex Kras, [place], [facility]  Pager: [phone] Office: [phone]

## 2022-10-20 ENCOUNTER — Telehealth: Payer: Self-pay

## 2022-10-20 DIAGNOSIS — I25708 Atherosclerosis of coronary artery bypass graft(s), unspecified, with other forms of angina pectoris: Secondary | ICD-10-CM | POA: Diagnosis not present

## 2022-10-20 NOTE — Progress Notes (Signed)
Patient called back, I transferred his call today with Lilia Pro to remind him of labs. I see where you spoke with him today about it.

## 2022-10-20 NOTE — Telephone Encounter (Signed)
ON-CALL CARDIOLOGY 10/20/22  Patient's name: Jesus Young.   MRN: [id number].    DOB: [birth year] Primary care provider: Merrilee Seashore, MD. Primary cardiologist: Rex Kras, DO, [facility]  Interaction regarding this patient's care today: Answered his call.   Patient corrects himself when compared to the last telephone encounter that he has been taking Imdur all along.   Currently he is taking Lasix qday, Aldactone 12.'5mg'$  po qday, and Kdur 78mq bid.   But has noticed that since decreasing the lasix to qday he is needing more nitro tabs at night (last two days).   No active CP.   Impression:   ICD-10-CM   1. 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   No orders of the defined types were placed in this encounter.   Recommendations: Since his last Cr was trending up recommended to continue same dose of lasix ('40mg'$  po q day).  Will increase Aldactone to '25mg'$  po qday.  Decrease K-Dur 20 mEq p.o. to qday.  Continue Imdur 60 mg po qday Asked him to check his BP and HR and if BP or HR allow would increase imdur or Toprol XL.  Unable to uptitrate Renexa due to renal function.  He is not wearing compression stocking - importance reemphasized.  Labs to be done Monday.  Educated on importance of glycemic control. Patient was reeducated on the importance of seeking medical attention by going to the closest ER via EMS if the chest pain increase in intensity, frequency, and/or duration.   Telephone encounter total time: 9 minutes.   SRex Kras [facility] [facility] Pager: [phone]: [phone]

## 2022-10-20 NOTE — Telephone Encounter (Signed)
Patient calling with complaints of arm and chest pain. Sunday he took 1 lasix, Monday he took 4, and Tuesday he took 5. He said just one Lasix is not working and wants to know what else can be done.

## 2022-10-24 ENCOUNTER — Other Ambulatory Visit: Payer: Self-pay | Admitting: Cardiology

## 2022-10-25 DIAGNOSIS — R059 Cough, unspecified: Secondary | ICD-10-CM | POA: Diagnosis not present

## 2022-10-26 ENCOUNTER — Telehealth: Payer: Self-pay

## 2022-10-26 NOTE — Telephone Encounter (Signed)
Pt would like to know if ok to wait until next week to get labs due to being sick. Please call pt to advise//ah

## 2022-10-26 NOTE — Telephone Encounter (Signed)
Done

## 2022-11-02 ENCOUNTER — Encounter: Payer: Self-pay | Admitting: Podiatry

## 2022-11-02 DIAGNOSIS — I5022 Chronic systolic (congestive) heart failure: Secondary | ICD-10-CM | POA: Diagnosis not present

## 2022-11-03 LAB — PRO B NATRIURETIC PEPTIDE: NT-Pro BNP: 9659 pg/mL — ABNORMAL HIGH (ref 0–486)

## 2022-11-03 LAB — BASIC METABOLIC PANEL
BUN/Creatinine Ratio: 11 (ref 10–24)
BUN: 24 mg/dL (ref 8–27)
CO2: 24 mmol/L (ref 20–29)
Calcium: 9.2 mg/dL (ref 8.6–10.2)
Chloride: 100 mmol/L (ref 96–106)
Creatinine, Ser: 2.27 mg/dL — ABNORMAL HIGH (ref 0.76–1.27)
Glucose: 115 mg/dL — ABNORMAL HIGH (ref 70–99)
Potassium: 4.3 mmol/L (ref 3.5–5.2)
Sodium: 138 mmol/L (ref 134–144)
eGFR: 29 mL/min/{1.73_m2} — ABNORMAL LOW (ref >59)

## 2022-11-05 ENCOUNTER — Encounter: Payer: Self-pay | Admitting: Cardiology

## 2022-11-05 ENCOUNTER — Ambulatory Visit: Payer: Medicare Other | Admitting: Cardiology

## 2022-11-05 VITALS — BP 122/72 | HR 99 | Resp 17 | Ht 75.0 in | Wt 170.2 lb

## 2022-11-05 DIAGNOSIS — I255 Ischemic cardiomyopathy: Secondary | ICD-10-CM | POA: Diagnosis not present

## 2022-11-05 DIAGNOSIS — I25118 Atherosclerotic heart disease of native coronary artery with other forms of angina pectoris: Secondary | ICD-10-CM

## 2022-11-05 DIAGNOSIS — I5022 Chronic systolic (congestive) heart failure: Secondary | ICD-10-CM

## 2022-11-05 DIAGNOSIS — Z955 Presence of coronary angioplasty implant and graft: Secondary | ICD-10-CM | POA: Diagnosis not present

## 2022-11-05 DIAGNOSIS — E782 Mixed hyperlipidemia: Secondary | ICD-10-CM

## 2022-11-05 DIAGNOSIS — I10 Essential (primary) hypertension: Secondary | ICD-10-CM

## 2022-11-05 DIAGNOSIS — I447 Left bundle-branch block, unspecified: Secondary | ICD-10-CM

## 2022-11-05 NOTE — Progress Notes (Signed)
ID:  Jesus Young, DOB Mar 08, 1946, MRN [id number]  PCP:  Merrilee Seashore, MD  Cardiologist:  Rex Kras, DO, [facility] (established care 08/26/2022) Former Cardiology Providers: Lawerance Cruel, PA Nephrologist: Dr. Marval Regal  Chief Complaint  Patient presents with   Coronary Artery Disease   Congestive Heart Failure   Follow-up    7 weeks    HPI  Jesus Young is a 76 y.o. Caucasian male whose past medical history and cardiovascular risk factors include: CAD status post CABG in 2012, LBBB, HTN, IDDM, hyperlipidemia, chronic kidney disease, polycythemia, history of COVID-19 infection, advanced age.  Known history of CAD status post CABG in 2012.  Had an NSTEMI in October 2022 and during which time he underwent PCI to the SVG to the RCA graft.  Since then he has been managed medically.  He did well clinically until October 2023 when he presented to the ED with precordial pain and ruled in for NSTEMI.  Given his symptoms, newly reduced LVEF, and EKG we did discuss early invasive angiography vs. Medical management. After long discussions with both patient, wife, and care team the shared decision was to treat him medically as contrast exposure will predispose him to worsening CKD and/or needing hemodialysis.  He was treated medically and clinically has done well.  His antianginal therapy has been uptitrated which includes Toprol-XL, Imdur, Ranexa, sublingual nitroglycerin tablets on a as needed basis.  Since his hospital in October 2023 he has been followed carefully with nephrology.  I have also reduced his Lasix and started SGLT2 inhibitors as well as spironolactone since last office visit.  His renal function remains stable (as of 11/02/2022 creatinine is 2.27 mg/dL), euvolemic on physical examination, stable angina pectoris with overall reduced frequency of symptoms.  His most recent labs note elevated BNP level but clinically he has lost weight since last office visit.  I  suspect this could be secondary to his recent flulike symptoms (his wife recently had flu/pneumonia).  He has an upcoming appointment to see Dr. Edwin Dada at [facility] end of this month.  ALLERGIES: Allergies  Allergen Reactions   Clonazepam Other (See Comments)    Headache    Lisinopril Other (See Comments)    headache    MEDICATION LIST PRIOR TO VISIT: Current Meds  Medication Sig   acetaminophen (TYLENOL) 650 MG CR tablet Take 1,300 mg by mouth in the morning and at bedtime.   aspirin EC 81 MG tablet Take 81 mg by mouth at bedtime.    B-D ULTRAFINE III SHORT PEN 31G X 8 MM MISC USE UTD   BRILINTA 90 MG TABS tablet TAKE 1 TABLET(90 MG) BY MOUTH TWICE DAILY   busPIRone (BUSPAR) 10 MG tablet Take 20 mg by mouth 2 (two) times daily.   Calcium Citrate-Vitamin D 315-5 MG-MCG TABS Take 1 tablet by mouth daily at 12 noon.   Docusate Sodium (GENTLE STOOL SOFTENER PO) Take 600 mg by mouth daily.   empagliflozin (JARDIANCE) 10 MG TABS tablet Take 1 tablet (10 mg total) by mouth daily before breakfast.   famotidine (PEPCID) 20 MG tablet Take 1 tablet (20 mg total) by mouth at bedtime.   furosemide (LASIX) 40 MG tablet Take 1 tablet (40 mg total) by mouth daily.   insulin detemir (LEVEMIR) 100 UNIT/ML injection Inject 0.16 mLs (16 Units total) into the skin 2 (two) times daily. (Patient taking differently: Inject 20-25 Units into the skin See admin instructions. Inject 20 units into the skin in the morning and 25  units at night)   insulin lispro (HUMALOG) 100 UNIT/ML injection Inject 0.05-0.09 mLs (5-9 Units total) into the skin 2 (two) times daily. 5 units at lunch, and 9 units after dinner. (Patient taking differently: Inject 5-15 Units into the skin 2 (two) times daily. 5-15 units as needed  (sliding scale insulin))   isosorbide mononitrate (IMDUR) 60 MG 24 hr tablet Take 1 tablet (60 mg total) by mouth daily.   levothyroxine (SYNTHROID) 88 MCG tablet Take 88 mcg by mouth daily.   meclizine  (ANTIVERT) 25 MG tablet Take 25 mg by mouth 3 (three) times daily as needed (for vertigo).   metoprolol succinate (TOPROL XL) 50 MG 24 hr tablet Take 1 tablet (50 mg total) by mouth daily. Take with or immediately following a meal.   Multiple Vitamins-Minerals (CENTRUM SILVER) tablet Take 1 tablet by mouth daily.   nitroGLYCERIN (NITROSTAT) 0.4 MG SL tablet Place 1 tablet (0.4 mg total) under the tongue every 5 (five) minutes as needed for chest pain.   ONE TOUCH ULTRA TEST test strip    pantoprazole (PROTONIX) 40 MG tablet Take 40 mg by mouth at bedtime.    potassium chloride SA (KLOR-CON M) 20 MEQ tablet TAKE 1 TABLET BY MOUTH TWICE DAILY (Patient taking differently: Take 20 mEq by mouth daily.)   ranolazine (RANEXA) 500 MG 12 hr tablet Take 1 tablet (500 mg total) by mouth 2 (two) times daily.   rosuvastatin (CRESTOR) 40 MG tablet Take 40 mg by mouth at bedtime.   spironolactone (ALDACTONE) 25 MG tablet Take 0.5 tablets (12.5 mg total) by mouth daily. (Patient taking differently: Take 25 mg by mouth daily.)     PAST MEDICAL HISTORY: Past Medical History:  Diagnosis Date   Anxiety    Arthritis    back and knees   Bronchitis    hx of 2015   Cancer ([place])    prostate   Cataract    immature on right   Chronic back pain    CKD (chronic kidney disease)    Constipation    takes Colace at bedtime   Coronary artery disease    takes Plavix daily but is on hold for surgery   Depression    Diabetes mellitus without complication (HCC)    takes Levemir and Humalog daily   Dizziness    Fall    hx of with injury to left shoulder    GERD (gastroesophageal reflux disease)    takes Protonix daily and Zantac   H/O urinary frequency    Hemorrhoids    History of bladder infections    History of kidney stones    Hyperlipidemia    takes Zocor every evening   Hypertension    takes Metoprolol daily   Hypothyroidism    takes Synthroid daily   Left bundle branch block    Myocardial infarction  ([place])    many yrs before 2002   Night muscle spasms    takes Robaxin 4 x day    Nocturia     PAST SURGICAL HISTORY: Past Surgical History:  Procedure Laterality Date   CARDIAC CATHETERIZATION  05/17/02   COLONOSCOPY     CORONARY ANGIOPLASTY     x 1    CORONARY ARTERY BYPASS GRAFT  2002   x 5   eyelid surgery      INGUINAL HERNIA REPAIR Left 05/06/2020   Procedure: LEFT INGUINAL HERNIA REPAIR;  Surgeon: Donnie Mesa, MD;  Location: [place];  Service: General;  Laterality: Left;  INSERTION OF MESH Left 05/06/2020   Procedure: INSERTION OF MESH;  Surgeon: Donnie Mesa, MD;  Location: [place];  Service: General;  Laterality: Left;   LEFT HEART CATH AND CORS/GRAFTS ANGIOGRAPHY N/A 12/05/2018   Procedure: LEFT HEART CATH AND CORS/GRAFTS ANGIOGRAPHY;  Surgeon: Adrian Prows, MD;  Location: [place] [facility];  Service: Cardiovascular;  Laterality: N/A;   LEFT HEART CATH AND CORS/GRAFTS ANGIOGRAPHY N/A 08/21/2021   Procedure: LEFT HEART CATH AND CORS/GRAFTS ANGIOGRAPHY;  Surgeon: Adrian Prows, MD;  Location: [place] [facility];  Service: Cardiovascular;  Laterality: N/A;   LUMBAR LAMINECTOMY/DECOMPRESSION MICRODISCECTOMY Bilateral 07/18/2013   Procedure: Bilateral Lumbar four-five Lumbar Laminotomy/Microdiskectomy;  Surgeon: Hosie Spangle, MD;  Location: [place] [facility];  Service: Neurosurgery;  Laterality: Bilateral;  Bilateral Lumbar four-five Lumbar Laminotomy/Microdiskectomy   NEPHROLITHOTOMY Left 06/23/2015   Procedure: NEPHROLITHOTOMY PERCUTANEOUS;  Surgeon: [name], MD;  Location: [facility];  Service: Urology;  Laterality: Left;   PROSTATECTOMY  10/2009   skin spots removed from back -precancerous     TONSILLECTOMY      FAMILY HISTORY: The patient family history includes Heart attack in his father; Heart disease in his father; Thyroid disease in his sister.  SOCIAL HISTORY:  The patient  reports that he has never smoked. He has never used smokeless tobacco. He reports that he does not  drink alcohol and does not use drugs.  REVIEW OF SYSTEMS: Review of Systems  Cardiovascular:  Positive for chest pain (Known history of stable angina (decreased in frequency)). Negative for claudication, dyspnea on exertion, irregular heartbeat, leg swelling, near-syncope, orthopnea, palpitations, paroxysmal nocturnal dyspnea and syncope.  Respiratory:  Negative for shortness of breath.   Hematologic/Lymphatic: Negative for bleeding problem.  Musculoskeletal:  Negative for muscle cramps and myalgias.  Neurological:  Negative for dizziness and light-headedness.   PHYSICAL EXAM:    11/05/2022    2:02 PM 09/06/2022    2:45 PM 08/29/2022    3:00 PM  Vitals with BMI  Height '6\' 3"'$  '6\' 3"'$    Weight 170 lbs 3 oz 174 lbs 173 lbs 5 oz  BMI 21.27 17.51 02.58  Systolic 527 782   Diastolic 72 64   Pulse 99 85     Physical Exam  Constitutional: No distress.  Age appropriate, hemodynamically stable.   Neck: No JVD present.  Cardiovascular: Normal rate, regular rhythm, S1 normal, S2 normal, intact distal pulses and normal pulses. Exam reveals no gallop, no S3 and no S4.  No murmur heard. Pulses:      Dorsalis pedis pulses are 2+ on the right side and 2+ on the left side.       Posterior tibial pulses are 2+ on the right side and 2+ on the left side.  Pulmonary/Chest: Effort normal and breath sounds normal. No stridor. He has no wheezes. He has no rales.  Abdominal: Soft. Bowel sounds are normal. He exhibits no distension. There is no abdominal tenderness.  Musculoskeletal:        General: No edema.     Cervical back: Neck supple.  Neurological: He is alert and oriented to person, place, and time. He has intact cranial nerves (2-12).  Skin: Skin is warm and moist.   CARDIAC DATABASE: EKG: 09/06/2022: Sinus rhythm, 84 bpm, left bundle branch block, left axis, ST-T changes likely secondary to underlying LBBB but ischemia cannot be ruled out.  Echocardiogram: 08/27/2022:  1. Left  ventricular ejection fraction, by estimation, is 25 to 30%. The left ventricle has severely decreased  function. The left ventricle demonstrates regional wall motion abnormalities (see scoring diagram/findings for description). The left ventricular internal cavity size was dilated. Left ventricular diastolic parameters are consistent with Grade I diastolic dysfunction (impaired relaxation).   2. Right ventricular systolic function is mildly reduced. The right ventricular size is normal. There is normal pulmonary artery systolic pressure.   3. A small pericardial effusion is present.   4. The mitral valve is grossly normal. Mild mitral valve regurgitation.   5. The aortic valve is tricuspid. Aortic valve regurgitation is not visualized. No aortic stenosis is present.   6. The inferior vena cava is normal in size with greater than 50% respiratory variability, suggesting right atrial pressure of 3 mmHg.   Stress Testing: Lexiscan Tetrofosmin stress test 11/24/2020: Lexiscan nuclear stress test performed using 1-day protocol. Rest and stress EKG showed sinus rhythm, IVCD/tachycardia. Normal myocardial perfusion. Stress LVEF 25%. High risk study due to low stress LVEF.  Heart Catheterization: 08/21/2021 LV: 124/7, EDP 13 mmHg.  Ao 122/70, mean 92 mmHg.  No pressure gradient across the aortic valve. LM: Diffusely diseased with occluded proximal LAD and proximal circumflex with minor branches evident. RCA: Occluded in the midsegment and severely diseased proximally. SVG to RCA: Proximal to mid segment has a ulcerated 80 to 90% stenosis.  Native vessel itself in the PL branch has a diffuse 90% stenosis and PDA has 60 to 70% stenosis.  This is unchanged from prior cardiac catheterization. Successful direct stenting with use of a spider filter wire for distal protection and implantation of a 4.0 x 16 mm Synergy XD at 16 atmospheric pressure, 60 seconds, stenosis reduced to 0%.  TIMI-3 to TIMI-3 flow. SVG to  D1: Occluded. Free radial graft to OM1: Widely patent. LIMA to LAD: Widely patent.  There is mild to moderate disease in the native LAD which appears to have improved from prior cardiac catheterization.  Recommendation: Patient will be placed on dual antiplatelet therapy.  He will need to be on diuretics in the morning, please start Carrington Clamp next week once renal function is stable.  He can potentially go home either tomorrow if renal function is stable and he is hemodynamically stable.  60 mL contrast utilized.  LABORATORY DATA:    Latest Ref Rng & Units 08/29/2022    2:29 AM 08/28/2022    6:42 AM 08/27/2022    8:19 AM  CBC  WBC 4.0 - 10.5 K/uL 5.8  5.6    Hemoglobin 13.0 - 17.0 g/dL 12.1  12.2  11.9   Hematocrit 39.0 - 52.0 % 35.4  36.1  35.0   Platelets 150 - 400 K/uL 167  170         Latest Ref Rng & Units 11/02/2022    1:26 PM 10/15/2022    2:26 PM 09/30/2022   11:01 AM  CMP  Glucose 70 - 99 mg/dL 115  212  119   BUN 8 - 27 mg/dL 24  39  38   Creatinine 0.76 - 1.27 mg/dL 2.27  2.71  2.38   Sodium 134 - 144 mmol/L 138  136  138   Potassium 3.5 - 5.2 mmol/L 4.3  3.2  3.3   Chloride 96 - 106 mmol/L 100  95  97   CO2 20 - 29 mmol/L '24  24  25   '$ Calcium 8.6 - 10.2 mg/dL 9.2  8.9  9.5     Lipid Panel     Component Value Date/Time   CHOL 109 08/29/2022 0229  TRIG 214 (H) 08/29/2022 0229   HDL 29 (L) 08/29/2022 0229   CHOLHDL 3.8 08/29/2022 0229   VLDL 43 (H) 08/29/2022 0229   LDLCALC 37 08/29/2022 0229   LDLDIRECT 51 08/29/2022 0229    No components found for: "NTPROBNP" Recent Labs    09/30/22 1101 10/15/22 1426 11/02/22 1324  PROBNP 5,409* 4,725* 9,659*   Recent Labs    08/27/22 0819  TSH 2.142    BMP Recent Labs    08/27/22 0309 08/28/22 0642 08/29/22 0229 09/30/22 1101 10/15/22 1426 11/02/22 1326  NA 133* 134* 135 138 136 138  K 2.9* 3.4* 3.5 3.3* 3.2* 4.3  CL 93* 103 106 97 95* 100  CO2 '26 25 22 25 24 24  '$ GLUCOSE 327* 104* 150* 119* 212*  115*  BUN 44* 34* 28* 38* 39* 24  CREATININE 3.09* 2.51* 2.33* 2.38* 2.71* 2.27*  CALCIUM 8.7* 8.5* 8.7* 9.5 8.9 9.2  GFRNONAA 20* 26* 28*  --   --   --     HEMOGLOBIN A1C Lab Results  Component Value Date   HGBA1C 7.7 (H) 08/27/2022   MPG 174.29 08/27/2022   External Labs: Collected: 08/26/2022 provided by the patient performed at [facility] AST 24, ALT 33, alkaline phosphatase 46. BUN 40, creatinine 2.79. BUN/creatinine ratio 14.3. Chloride 95  External Labs: Office note dated 09/01/2022 by PCP noted in Care Everywhere  Sodium 137 136-145 mEq/L    Potassium 4.0 3.5-5.1 mEq/L    Chloride 100 98-107 mEq/L    CO2 29 21-32 mmol/L    Glucose 127 74-106 mg/dL    BUN 24 7-18 mg/dL    Creatinine 2.26 0.70-1.30 mg/dL    Calcium 9.4 8.5-10.1 mg/dL    BUN/Creatinine Ratio 10.6 11.0-26.0 Ratio    GFR/Black 31 >59 mL/min/1.52m    GFR/White 27 >59 mL/min/1.766m    IMPRESSION:    ICD-10-CM   1. Chronic HFrEF (heart failure with reduced ejection fraction) (HCC)  I5N05.39asic metabolic panel    Magnesium    Pro b natriuretic peptide (BNP)    2. Ischemic cardiomyopathy  I2J67.3asic metabolic panel    Magnesium    Pro b natriuretic peptide (BNP)    Lipid Panel With LDL/HDL Ratio    LDL cholesterol, direct    3. Coronary artery disease of native artery of native heart with stable [place]ina pectoris ([place] I25.118 Lipid Panel With LDL/HDL Ratio    LDL cholesterol, direct    4. Status post insertion of drug eluting coronary artery stent  Z95.5 Lipid Panel With LDL/HDL Ratio    LDL cholesterol, direct    5. Mixed hyperlipidemia  E78.2 Lipid Panel With LDL/HDL Ratio    LDL cholesterol, direct    6. LBBB (left bundle branch block)  I44.7     7. Benign hypertension  I10        RECOMMENDATIONS: DoJAYLEEN AFONSOs a [age].o. Caucasian male whose past medical history and cardiac risk factors include: CAD status post CABG in 2012, LBBB, HTN, IDDM, hyperlipidemia, chronic kidney  disease, polycythemia, history of COVID-19 infection, advanced age.  Chronic HFrEF (heart failure with reduced ejection fraction) (HCC) /ischemic cardiomyopathy Stage C, NYHA class II Clinically euvolemic. Lost 4 # since the last office visit in October 2023 but proBNP treaded up.  Labs from 11/02/2022 - stable Cr and K levels.  Recent echocardiogram noted estimated RAP 3 mmHg and grade 1 diastolic impairment.  Continue current medications.  Will recheck labs and re-evaluate his symptoms  in 2 weeks.  He has an appt to see Dr. Edwin Dada at [facility] next week.  Ideally, would like to uptitrate GDMT with close monitoring of renal function. Will consider ARB/ARNi at the next visit if renal function remains stable.   History of CABG with stable angina pectoris /status post PCI SVG to RCA  Stable angina - with reduced frequency of symptoms.  Antianginal therapies include beta-blockers, Imdur, Ranexa, SL Nitro prn. Had referred him to Dr. Carlisle Cater at [facility] to discuss the role of enhanced extracorporeal counterpulsation (EECP) therapy either at [facility] or [facility] at [place].  However, he has an appt to see Dr. Edwin Dada at [facility] next week.  Patient understands that if his symptoms are refractory to medical therapy he should go to the closet ER via EMS to further evaluation and if clinically warrant angiography should still be considered. He verbalizes understanding.   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 Recent labs reviewed.  Follow-up with nephrology.  Mixed hyperlipidemia Currently on rosuvastatin.   Will check fasting lipid prior to next visit.  He denies myalgia or other side effects.  FINAL MEDICATION LIST END OF ENCOUNTER: No orders of the defined types were placed in this encounter.   There are no discontinued medications.    Current Outpatient Medications:    acetaminophen (TYLENOL) 650 MG CR tablet, Take 1,300 mg by mouth  in the morning and at bedtime., Disp: , Rfl:    aspirin EC 81 MG tablet, Take 81 mg by mouth at bedtime. , Disp: , Rfl:    B-D ULTRAFINE III SHORT PEN 31G X 8 MM MISC, USE UTD, Disp: , Rfl: 0   BRILINTA 90 MG TABS tablet, TAKE 1 TABLET(90 MG) BY MOUTH TWICE DAILY, Disp: 60 tablet, Rfl: 3   busPIRone (BUSPAR) 10 MG tablet, Take 20 mg by mouth 2 (two) times daily., Disp: , Rfl:    Calcium Citrate-Vitamin D 315-5 MG-MCG TABS, Take 1 tablet by mouth daily at 12 noon., Disp: , Rfl:    Docusate Sodium (GENTLE STOOL SOFTENER PO), Take 600 mg by mouth daily., Disp: , Rfl:    empagliflozin (JARDIANCE) 10 MG TABS tablet, Take 1 tablet (10 mg total) by mouth daily before breakfast., Disp: 90 tablet, Rfl: 0   famotidine (PEPCID) 20 MG tablet, Take 1 tablet (20 mg total) by mouth at bedtime., Disp: 90 tablet, Rfl: 1   furosemide (LASIX) 40 MG tablet, Take 1 tablet (40 mg total) by mouth daily., Disp: 30 tablet, Rfl: 3   insulin detemir (LEVEMIR) 100 UNIT/ML injection, Inject 0.16 mLs (16 Units total) into the skin 2 (two) times daily. (Patient taking differently: Inject 20-25 Units into the skin See admin instructions. Inject 20 units into the skin in the morning and 25 units at night), Disp: 10 mL, Rfl: 1   insulin lispro (HUMALOG) 100 UNIT/ML injection, Inject 0.05-0.09 mLs (5-9 Units total) into the skin 2 (two) times daily. 5 units at lunch, and 9 units after dinner. (Patient taking differently: Inject 5-15 Units into the skin 2 (two) times daily. 5-15 units as needed  (sliding scale insulin)), Disp: 10 mL, Rfl: 1   isosorbide mononitrate (IMDUR) 60 MG 24 hr tablet, Take 1 tablet (60 mg total) by mouth daily., Disp: 30 tablet, Rfl: 3   levothyroxine (SYNTHROID) 88 MCG tablet, Take 88 mcg by mouth daily., Disp: , Rfl:    meclizine (ANTIVERT) 25 MG tablet, Take 25 mg by mouth 3 (three) times daily  as needed (for vertigo)., Disp: , Rfl:    metoprolol succinate (TOPROL XL) 50 MG 24 hr tablet, Take 1 tablet (50 mg  total) by mouth daily. Take with or immediately following a meal., Disp: 90 tablet, Rfl: 1   Multiple Vitamins-Minerals (CENTRUM SILVER) tablet, Take 1 tablet by mouth daily., Disp: , Rfl:    nitroGLYCERIN (NITROSTAT) 0.4 MG SL tablet, Place 1 tablet (0.4 mg total) under the tongue every 5 (five) minutes as needed for chest pain., Disp: 25 tablet, Rfl: 3   ONE TOUCH ULTRA TEST test strip, , Disp: , Rfl:    pantoprazole (PROTONIX) 40 MG tablet, Take 40 mg by mouth at bedtime. , Disp: , Rfl:    potassium chloride SA (KLOR-CON M) 20 MEQ tablet, TAKE 1 TABLET BY MOUTH TWICE DAILY (Patient taking differently: Take 20 mEq by mouth daily.), Disp: 180 tablet, Rfl: 1   ranolazine (RANEXA) 500 MG 12 hr tablet, Take 1 tablet (500 mg total) by mouth 2 (two) times daily., Disp: 180 tablet, Rfl: 1   rosuvastatin (CRESTOR) 40 MG tablet, Take 40 mg by mouth at bedtime., Disp: , Rfl:    spironolactone (ALDACTONE) 25 MG tablet, Take 0.5 tablets (12.5 mg total) by mouth daily. (Patient taking differently: Take 25 mg by mouth daily.), Disp: 90 tablet, Rfl: 0  Orders Placed This Encounter  Procedures   Basic metabolic panel   Magnesium   Pro b natriuretic peptide (BNP)   Lipid Panel With LDL/HDL Ratio   LDL cholesterol, direct    There are no Patient Instructions on file for this visit.   --Continue cardiac medications as reconciled in final medication list. --Return in about 6 weeks (around 12/17/2022) for Follow up, heart failure management., CAD. or sooner if needed. --Continue follow-up with your primary care physician regarding the management of your other chronic comorbid conditions.  Patient's questions and concerns were addressed to his satisfaction. He voices understanding of the instructions provided during this encounter.   This note was created using a voice recognition software as a result there may be grammatical errors inadvertently enclosed that do not reflect the nature of this encounter. Every  attempt is made to correct such errors.  Rex Kras, [place], [facility]  Pager: [phone] Office: [phone]

## 2022-11-11 DIAGNOSIS — I25118 Atherosclerotic heart disease of native coronary artery with other forms of angina pectoris: Secondary | ICD-10-CM | POA: Diagnosis not present

## 2022-11-11 DIAGNOSIS — Z5181 Encounter for therapeutic drug level monitoring: Secondary | ICD-10-CM | POA: Diagnosis not present

## 2022-11-11 DIAGNOSIS — R0609 Other forms of dyspnea: Secondary | ICD-10-CM | POA: Diagnosis not present

## 2022-11-11 DIAGNOSIS — N1832 Chronic kidney disease, stage 3b: Secondary | ICD-10-CM | POA: Diagnosis not present

## 2022-11-11 DIAGNOSIS — I5020 Unspecified systolic (congestive) heart failure: Secondary | ICD-10-CM | POA: Diagnosis not present

## 2022-11-19 ENCOUNTER — Other Ambulatory Visit: Payer: Self-pay | Admitting: Cardiology

## 2022-11-19 DIAGNOSIS — I5022 Chronic systolic (congestive) heart failure: Secondary | ICD-10-CM | POA: Diagnosis not present

## 2022-11-19 DIAGNOSIS — I255 Ischemic cardiomyopathy: Secondary | ICD-10-CM | POA: Diagnosis not present

## 2022-11-20 LAB — BASIC METABOLIC PANEL
BUN/Creatinine Ratio: 17 (ref 10–24)
BUN: 37 mg/dL — ABNORMAL HIGH (ref 8–27)
CO2: 20 mmol/L (ref 20–29)
Calcium: 9.5 mg/dL (ref 8.6–10.2)
Chloride: 101 mmol/L (ref 96–106)
Creatinine, Ser: 2.24 mg/dL — ABNORMAL HIGH (ref 0.76–1.27)
Glucose: 215 mg/dL — ABNORMAL HIGH (ref 70–99)
Potassium: 5.4 mmol/L — ABNORMAL HIGH (ref 3.5–5.2)
Sodium: 137 mmol/L (ref 134–144)
eGFR: 30 mL/min/{1.73_m2} — ABNORMAL LOW (ref >59)

## 2022-11-20 LAB — PRO B NATRIURETIC PEPTIDE: NT-Pro BNP: 4941 pg/mL — ABNORMAL HIGH (ref 0–486)

## 2022-11-20 LAB — MAGNESIUM: Magnesium: 2.2 mg/dL (ref 1.6–2.3)

## 2022-11-23 ENCOUNTER — Telehealth: Payer: Self-pay

## 2022-11-23 NOTE — Telephone Encounter (Deleted)
-----   Message from [place], [place] sent at 11/21/2022  4:20 PM EST ----- NTproBNP improved - continue w/ the changes recommended by the providers at [facility] for now.   Potassium slightly elevated - reduce the consumption of potassium rich food and see if the providers at [facility] place you on medications like Lokelma or Veltassa. If you dont hear back from them call their office your nephrologist.   Please follow up w/ PCP or DM doctor as your sugars are not well controlled.   We will review the results at the upcoming office visit.  Sunit [place], DO, [facility]

## 2022-11-23 NOTE — Progress Notes (Signed)
Fax office notes to : [phone]

## 2022-11-23 NOTE — Progress Notes (Signed)
Labs and last office note sent to Dr. Cloretta Ned

## 2022-11-23 NOTE — Progress Notes (Signed)
See telephone note.

## 2022-11-23 NOTE — Telephone Encounter (Signed)
Patient informed me that he has been placed on Entresto 24-'26mg'$  and his Metoprolol increased to '100mg'$  by Dr. Edwin Dada at [facility]. Says this may be only temporary depending on lab work he has.  Discussed Dr. Brennan Bailey recommendation's patient verbalized understanding.

## 2022-11-23 NOTE — Telephone Encounter (Signed)
-----   Message from [place], [place] sent at 11/21/2022  4:20 PM EST ----- NTproBNP improved - continue w/ the changes recommended by the providers at [facility] for now.   Potassium slightly elevated - reduce the consumption of potassium rich food and see if the providers at [facility] place you on medications like Lokelma or Veltassa. If you dont hear back from them call their office your nephrologist.   Please follow up w/ PCP or DM doctor as your sugars are not well controlled.   We will review the results at the upcoming office visit.  Sunit Malden, DO, [facility]

## 2022-11-24 DIAGNOSIS — R069 Unspecified abnormalities of breathing: Secondary | ICD-10-CM | POA: Diagnosis not present

## 2022-11-24 DIAGNOSIS — Z5181 Encounter for therapeutic drug level monitoring: Secondary | ICD-10-CM | POA: Diagnosis not present

## 2022-11-29 ENCOUNTER — Other Ambulatory Visit: Payer: Self-pay | Admitting: Cardiology

## 2022-11-30 ENCOUNTER — Other Ambulatory Visit: Payer: Self-pay | Admitting: Cardiology

## 2022-11-30 DIAGNOSIS — I5023 Acute on chronic systolic (congestive) heart failure: Secondary | ICD-10-CM | POA: Diagnosis not present

## 2022-11-30 DIAGNOSIS — I5022 Chronic systolic (congestive) heart failure: Secondary | ICD-10-CM

## 2022-11-30 DIAGNOSIS — I5020 Unspecified systolic (congestive) heart failure: Secondary | ICD-10-CM | POA: Diagnosis not present

## 2022-11-30 DIAGNOSIS — N1832 Chronic kidney disease, stage 3b: Secondary | ICD-10-CM | POA: Diagnosis not present

## 2022-11-30 DIAGNOSIS — I130 Hypertensive heart and chronic kidney disease with heart failure and stage 1 through stage 4 chronic kidney disease, or unspecified chronic kidney disease: Secondary | ICD-10-CM | POA: Diagnosis not present

## 2022-11-30 DIAGNOSIS — R0609 Other forms of dyspnea: Secondary | ICD-10-CM | POA: Diagnosis not present

## 2022-11-30 DIAGNOSIS — I25118 Atherosclerotic heart disease of native coronary artery with other forms of angina pectoris: Secondary | ICD-10-CM | POA: Diagnosis not present

## 2022-12-07 DIAGNOSIS — I5020 Unspecified systolic (congestive) heart failure: Secondary | ICD-10-CM | POA: Diagnosis not present

## 2022-12-10 ENCOUNTER — Other Ambulatory Visit (HOSPITAL_COMMUNITY)
Admission: RE | Admit: 2022-12-10 | Discharge: 2022-12-10 | Disposition: A | Discharge status: Home / Self Care | Payer: Medicare Other | Source: Other Acute Inpatient Hospital

## 2022-12-10 DIAGNOSIS — Z029 Encounter for administrative examinations, unspecified: Secondary | ICD-10-CM | POA: Diagnosis present

## 2022-12-10 DIAGNOSIS — R519 Headache, unspecified: Secondary | ICD-10-CM | POA: Diagnosis not present

## 2022-12-10 DIAGNOSIS — E039 Hypothyroidism, unspecified: Secondary | ICD-10-CM | POA: Diagnosis not present

## 2022-12-10 DIAGNOSIS — E1165 Type 2 diabetes mellitus with hyperglycemia: Secondary | ICD-10-CM | POA: Diagnosis not present

## 2022-12-10 DIAGNOSIS — H5310 Unspecified subjective visual disturbances: Secondary | ICD-10-CM | POA: Diagnosis not present

## 2022-12-10 DIAGNOSIS — E782 Mixed hyperlipidemia: Secondary | ICD-10-CM | POA: Diagnosis not present

## 2022-12-10 LAB — CBC WITH DIFFERENTIAL/PLATELET
Abs Immature Granulocytes: 0.02 10*3/uL (ref 0.00–0.07)
Basophils Absolute: 0 10*3/uL (ref 0.0–0.1)
Basophils Relative: 0 %
Eosinophils Absolute: 0.2 10*3/uL (ref 0.0–0.5)
Eosinophils Relative: 4 %
HCT: 44.1 % (ref 39.0–52.0)
Hemoglobin: 14 g/dL (ref 13.0–17.0)
Immature Granulocytes: 0 %
Lymphocytes Relative: 15 %
Lymphs Abs: 0.8 10*3/uL (ref 0.7–4.0)
MCH: 30.1 pg (ref 26.0–34.0)
MCHC: 31.7 g/dL (ref 30.0–36.0)
MCV: 94.8 fL (ref 80.0–100.0)
Monocytes Absolute: 0.8 10*3/uL (ref 0.1–1.0)
Monocytes Relative: 14 %
Neutro Abs: 3.7 10*3/uL (ref 1.7–7.7)
Neutrophils Relative %: 67 %
Platelets: 161 10*3/uL (ref 150–400)
RBC: 4.65 MIL/uL (ref 4.22–5.81)
RDW: 15.8 % — ABNORMAL HIGH (ref 11.5–15.5)
WBC: 5.6 10*3/uL (ref 4.0–10.5)
nRBC: 0 % (ref 0.0–0.2)

## 2022-12-10 LAB — C-REACTIVE PROTEIN: CRP: 1.2 mg/dL — ABNORMAL HIGH (ref <1.0)

## 2022-12-10 LAB — SEDIMENTATION RATE: Sed Rate: 6 mm/hr (ref 0–16)

## 2022-12-15 ENCOUNTER — Encounter: Payer: Self-pay | Admitting: Internal Medicine

## 2022-12-15 ENCOUNTER — Other Ambulatory Visit: Payer: Self-pay | Admitting: Cardiology

## 2022-12-15 MED ORDER — NITROGLYCERIN 0.4 MG SL SUBL: 0.4000 mg | SUBLINGUAL_TABLET | SUBLINGUAL | 3 refills | Status: DC | PRN

## 2022-12-17 ENCOUNTER — Encounter: Payer: Self-pay | Admitting: Cardiology

## 2022-12-17 ENCOUNTER — Ambulatory Visit: Payer: Medicare Other | Admitting: Cardiology

## 2022-12-17 VITALS — BP 120/64 | HR 74 | Ht 75.0 in | Wt 178.0 lb

## 2022-12-17 DIAGNOSIS — Z951 Presence of aortocoronary bypass graft: Secondary | ICD-10-CM | POA: Diagnosis not present

## 2022-12-17 DIAGNOSIS — I5022 Chronic systolic (congestive) heart failure: Secondary | ICD-10-CM

## 2022-12-17 DIAGNOSIS — I255 Ischemic cardiomyopathy: Secondary | ICD-10-CM

## 2022-12-17 DIAGNOSIS — Z955 Presence of coronary angioplasty implant and graft: Secondary | ICD-10-CM

## 2022-12-17 DIAGNOSIS — I447 Left bundle-branch block, unspecified: Secondary | ICD-10-CM

## 2022-12-17 DIAGNOSIS — I10 Essential (primary) hypertension: Secondary | ICD-10-CM

## 2022-12-17 DIAGNOSIS — I25118 Atherosclerotic heart disease of native coronary artery with other forms of angina pectoris: Secondary | ICD-10-CM

## 2022-12-17 DIAGNOSIS — E782 Mixed hyperlipidemia: Secondary | ICD-10-CM

## 2022-12-17 NOTE — Progress Notes (Signed)
ID:  Jesus Young, DOB December 15, 1945, MRN [id number]  PCP:  Merrilee Seashore, MD  Cardiologist:  Rex Kras, DO, [facility] (established care 08/26/2022) Former Cardiology Providers: Lawerance Cruel, PA Nephrologist: Dr. Marval Regal  Date: 12/17/22 Last Office Visit: 11/05/2022  Chief Complaint  Patient presents with  . Chronic HFrEF (heart failure with reduced ejection fraction  . Follow-up    HPI  Jesus Young is a 78 y.o. Caucasian male whose past medical history and cardiovascular risk factors include: CAD status post CABG in 2012, LBBB, HTN, IDDM, hyperlipidemia, chronic kidney disease, polycythemia, history of COVID-19 infection, advanced age.  Known history of CAD status post CABG in 2012.  Had an NSTEMI in October 2022 and during which time he underwent PCI to the SVG to the RCA graft.  Since then he has been managed medically.  He did well clinically until October 2023 when he presented to the ED with precordial pain and ruled in for NSTEMI.  Given his symptoms, newly reduced LVEF, and EKG we did discuss early invasive angiography vs. Medical management. After long discussions with both patient, wife, and care team the shared decision was to treat him medically as contrast exposure will predispose him to worsening CKD and/or needing hemodialysis.  He was treated medically and clinically has done well.  His antianginal therapy has been uptitrated which includes Toprol-XL, Imdur, Ranexa, sublingual nitroglycerin tablets on a as needed basis.  Since his hospital in October 2023 he has been followed carefully with nephrology.  I have also reduced his Lasix and started SGLT2 inhibitors as well as spironolactone since last office visit.  His renal function remains stable (as of 11/02/2022 creatinine is 2.27 mg/dL), euvolemic on physical examination, stable angina pectoris with overall reduced frequency of symptoms.  His most recent labs note elevated BNP level but clinically he  has lost weight since last office visit.  I suspect this could be secondary to his recent flulike symptoms (his wife recently had flu/pneumonia).  He has an upcoming appointment to see Dr. Edwin Dada at [facility] end of this month.  ALLERGIES: Allergies  Allergen Reactions  . Tetanus Antitoxin Other (See Comments)  . Clonazepam Other (See Comments)    Headache  . Lisinopril Other (See Comments)    headache    MEDICATION LIST PRIOR TO VISIT: Current Meds  Medication Sig  . acetaminophen (TYLENOL) 650 MG CR tablet Take 1,300 mg by mouth in the morning and at bedtime.  Marland Kitchen aspirin EC 81 MG tablet Take 81 mg by mouth at bedtime.   . B-D ULTRAFINE III SHORT PEN 31G X 8 MM MISC USE UTD  . BRILINTA 90 MG TABS tablet TAKE 1 TABLET(90 MG) BY MOUTH TWICE DAILY  . busPIRone (BUSPAR) 10 MG tablet Take 20 mg by mouth 2 (two) times daily.  . Calcium Citrate-Vitamin D 315-5 MG-MCG TABS Take 1 tablet by mouth daily at 12 noon.  Mariane Baumgarten Sodium (GENTLE STOOL SOFTENER PO) Take 600 mg by mouth daily.  Marland Kitchen ENTRESTO 24-26 MG Take 1 tablet by mouth 2 (two) times daily.  . famotidine (PEPCID) 20 MG tablet Take 1 tablet (20 mg total) by mouth at bedtime.  . furosemide (LASIX) 40 MG tablet Take 40 mg by mouth every other day. 1 tab TTHF and 2 tabs all other days.  . insulin detemir (LEVEMIR) 100 UNIT/ML injection Inject 0.16 mLs (16 Units total) into the skin 2 (two) times daily. (Patient taking differently: Inject 20-25 Units into the skin See admin instructions.  Inject 20 units into the skin in the morning and 25 units at night)  . insulin lispro (HUMALOG) 100 UNIT/ML injection Inject 0.05-0.09 mLs (5-9 Units total) into the skin 2 (two) times daily. 5 units at lunch, and 9 units after dinner. (Patient taking differently: Inject 5-15 Units into the skin 2 (two) times daily. 5-15 units as needed  (sliding scale insulin))  . isosorbide mononitrate (IMDUR) 60 MG 24 hr tablet Take 1 tablet (60 mg total) by mouth daily.  Marland Kitchen  JARDIANCE 10 MG TABS tablet TAKE 1 TABLET(10 MG) BY MOUTH DAILY BEFORE BREAKFAST  . levothyroxine (SYNTHROID) 88 MCG tablet Take 88 mcg by mouth daily.  . meclizine (ANTIVERT) 25 MG tablet Take 25 mg by mouth 3 (three) times daily as needed (for vertigo).  . metoprolol succinate (TOPROL XL) 50 MG 24 hr tablet Take 1 tablet (50 mg total) by mouth daily. Take with or immediately following a meal. (Patient taking differently: Take 50 mg by mouth in the morning and at bedtime. Take with or immediately following a meal.)  . Multiple Vitamins-Minerals (CENTRUM SILVER) tablet Take 1 tablet by mouth daily.  . nitroGLYCERIN (NITROSTAT) 0.4 MG SL tablet Place 1 tablet (0.4 mg total) under the tongue every 5 (five) minutes as needed for chest pain.  . ONE TOUCH ULTRA TEST test strip   . pantoprazole (PROTONIX) 40 MG tablet Take 40 mg by mouth at bedtime.   . ranolazine (RANEXA) 500 MG 12 hr tablet Take 1 tablet (500 mg total) by mouth 2 (two) times daily.  . rosuvastatin (CRESTOR) 40 MG tablet Take 40 mg by mouth at bedtime.  . sacubitril-valsartan (ENTRESTO) 24-26 MG Take 1 tablet by mouth 2 (two) times daily.  Marland Kitchen spironolactone (ALDACTONE) 25 MG tablet Take 25 mg by mouth daily.     PAST MEDICAL HISTORY: Past Medical History:  Diagnosis Date  . Anxiety   . Arthritis    back and knees  . Bronchitis    hx of 2015  . Cancer [facility])    prostate  . Cataract    immature on right  . Chronic back pain   . CKD (chronic kidney disease)   . Constipation    takes Colace at bedtime  . Coronary artery disease    takes Plavix daily but is on hold for surgery  . Depression   . Diabetes mellitus without complication ([place])    takes Levemir and Humalog daily  . Dizziness   . Fall    hx of with injury to left shoulder   . GERD (gastroesophageal reflux disease)    takes Protonix daily and Zantac  . H/O urinary frequency   . Hemorrhoids   . History of bladder infections   . History of kidney stones   .  Hyperlipidemia    takes Zocor every evening  . Hypertension    takes Metoprolol daily  . Hypothyroidism    takes Synthroid daily  . Left bundle branch block   . Myocardial infarction [facility])    many yrs before 2002  . Night muscle spasms    takes Robaxin 4 x day   . Nocturia     PAST SURGICAL HISTORY: Past Surgical History:  Procedure Laterality Date  . CARDIAC CATHETERIZATION  05/17/02  . COLONOSCOPY    . CORONARY ANGIOPLASTY     x 1   . CORONARY ARTERY BYPASS GRAFT  2002   x 5  . eyelid surgery     . INGUINAL HERNIA REPAIR  Left 05/06/2020   Procedure: LEFT INGUINAL HERNIA REPAIR;  Surgeon: Donnie Mesa, MD;  Location: [place];  Service: General;  Laterality: Left;  . INSERTION OF MESH Left 05/06/2020   Procedure: INSERTION OF MESH;  Surgeon: Donnie Mesa, MD;  Location: [place];  Service: General;  Laterality: Left;  . LEFT HEART CATH AND CORS/GRAFTS ANGIOGRAPHY N/A 12/05/2018   Procedure: LEFT HEART CATH AND CORS/GRAFTS ANGIOGRAPHY;  Surgeon: Adrian Prows, MD;  Location: [place] CV LAB;  Service: Cardiovascular;  Laterality: N/A;  . LEFT HEART CATH AND CORS/GRAFTS ANGIOGRAPHY N/A 08/21/2021   Procedure: LEFT HEART CATH AND CORS/GRAFTS ANGIOGRAPHY;  Surgeon: Adrian Prows, MD;  Location: [place] CV LAB;  Service: Cardiovascular;  Laterality: N/A;  . LUMBAR LAMINECTOMY/DECOMPRESSION MICRODISCECTOMY Bilateral 07/18/2013   Procedure: Bilateral Lumbar four-five Lumbar Laminotomy/Microdiskectomy;  Surgeon: Hosie Spangle, MD;  Location: [place] NEURO ORS;  Service: Neurosurgery;  Laterality: Bilateral;  Bilateral Lumbar four-five Lumbar Laminotomy/Microdiskectomy  . NEPHROLITHOTOMY Left 06/23/2015   Procedure: NEPHROLITHOTOMY PERCUTANEOUS;  Surgeon: [name], MD;  Location: [facility];  Service: Urology;  Laterality: Left;  . PROSTATECTOMY  10/2009  . skin spots removed from back -precancerous    . TONSILLECTOMY      FAMILY HISTORY: The patient family history includes Heart attack in his  father; Heart disease in his father; Thyroid disease in his sister.  SOCIAL HISTORY:  The patient  reports that he has never smoked. He has never used smokeless tobacco. He reports that he does not drink alcohol and does not use drugs.  REVIEW OF SYSTEMS: Review of Systems  Cardiovascular:  Positive for chest pain (Known history of stable angina (decreased in frequency)). Negative for claudication, dyspnea on exertion, irregular heartbeat, leg swelling, near-syncope, orthopnea, palpitations, paroxysmal nocturnal dyspnea and syncope.  Respiratory:  Negative for shortness of breath.   Hematologic/Lymphatic: Negative for bleeding problem.  Musculoskeletal:  Negative for muscle cramps and myalgias.  Neurological:  Negative for dizziness and light-headedness.   PHYSICAL EXAM:    12/17/2022    2:45 PM 11/05/2022    2:02 PM 09/06/2022    2:45 PM  Vitals with BMI  Height '6\' 3"'$  '6\' 3"'$  '6\' 3"'$   Weight 178 lbs 170 lbs 3 oz 174 lbs  BMI 22.25 76.22 63.33  Systolic 545 625 638  Diastolic 64 72 64  Pulse 74 99 85    Physical Exam  Constitutional: No distress.  Age appropriate, hemodynamically stable.   Neck: No JVD present.  Cardiovascular: Normal rate, regular rhythm, S1 normal, S2 normal, intact distal pulses and normal pulses. Exam reveals no gallop, no S3 and no S4.  No murmur heard. Pulses:      Dorsalis pedis pulses are 2+ on the right side and 2+ on the left side.       Posterior tibial pulses are 2+ on the right side and 2+ on the left side.  Pulmonary/Chest: Effort normal and breath sounds normal. No stridor. He has no wheezes. He has no rales.  Abdominal: Soft. Bowel sounds are normal. He exhibits no distension. There is no abdominal tenderness.  Musculoskeletal:        General: No edema.     Cervical back: Neck supple.  Neurological: He is alert and oriented to person, place, and time. He has intact cranial nerves (2-12).  Skin: Skin is warm and moist.   CARDIAC  DATABASE: EKG: 09/06/2022: Sinus rhythm, 84 bpm, left bundle branch block, left axis, ST-T changes likely secondary to underlying LBBB but ischemia  cannot be ruled out.  Echocardiogram: 08/27/2022:  1. Left ventricular ejection fraction, by estimation, is 25 to 30%. The left ventricle has severely decreased function. The left ventricle demonstrates regional wall motion abnormalities (see scoring diagram/findings for description). The left ventricular internal cavity size was dilated. Left ventricular diastolic parameters are consistent with Grade I diastolic dysfunction (impaired relaxation).   2. Right ventricular systolic function is mildly reduced. The right ventricular size is normal. There is normal pulmonary artery systolic pressure.   3. A small pericardial effusion is present.   4. The mitral valve is grossly normal. Mild mitral valve regurgitation.   5. The aortic valve is tricuspid. Aortic valve regurgitation is not visualized. No aortic stenosis is present.   6. The inferior vena cava is normal in size with greater than 50% respiratory variability, suggesting right atrial pressure of 3 mmHg.   Stress Testing: Lexiscan Tetrofosmin stress test 11/24/2020: Lexiscan nuclear stress test performed using 1-day protocol. Rest and stress EKG showed sinus rhythm, IVCD/tachycardia. Normal myocardial perfusion. Stress LVEF 25%. High risk study due to low stress LVEF.  Heart Catheterization: 08/21/2021 LV: 124/7, EDP 13 mmHg.  Ao 122/70, mean 92 mmHg.  No pressure gradient across the aortic valve. LM: Diffusely diseased with occluded proximal LAD and proximal circumflex with minor branches evident. RCA: Occluded in the midsegment and severely diseased proximally. SVG to RCA: Proximal to mid segment has a ulcerated 80 to 90% stenosis.  Native vessel itself in the PL branch has a diffuse 90% stenosis and PDA has 60 to 70% stenosis.  This is unchanged from prior cardiac  catheterization. Successful direct stenting with use of a spider filter wire for distal protection and implantation of a 4.0 x 16 mm Synergy XD at 16 atmospheric pressure, 60 seconds, stenosis reduced to 0%.  TIMI-3 to TIMI-3 flow. SVG to D1: Occluded. Free radial graft to OM1: Widely patent. LIMA to LAD: Widely patent.  There is mild to moderate disease in the native LAD which appears to have improved from prior cardiac catheterization.  Recommendation: Patient will be placed on dual antiplatelet therapy.  He will need to be on diuretics in the morning, please start Carrington Clamp next week once renal function is stable.  He can potentially go home either tomorrow if renal function is stable and he is hemodynamically stable.  60 mL contrast utilized.  LABORATORY DATA:    Latest Ref Rng & Units 12/10/2022    5:00 PM 08/29/2022    2:29 AM 08/28/2022    6:42 AM  CBC  WBC 4.0 - 10.5 K/uL 5.6  5.8  5.6   Hemoglobin 13.0 - 17.0 g/dL 14.0  12.1  12.2   Hematocrit 39.0 - 52.0 % 44.1  35.4  36.1   Platelets 150 - 400 K/uL 161  167  170        Latest Ref Rng & Units 11/19/2022    2:35 PM 11/02/2022    1:26 PM 10/15/2022    2:26 PM  CMP  Glucose 70 - 99 mg/dL 215  115  212   BUN 8 - 27 mg/dL 37  24  39   Creatinine 0.76 - 1.27 mg/dL 2.24  2.27  2.71   Sodium 134 - 144 mmol/L 137  138  136   Potassium 3.5 - 5.2 mmol/L 5.4  4.3  3.2   Chloride 96 - 106 mmol/L 101  100  95   CO2 20 - 29 mmol/L '20  24  24   '$ Calcium  8.6 - 10.2 mg/dL 9.5  9.2  8.9     Lipid Panel     Component Value Date/Time   CHOL 109 08/29/2022 0229   TRIG 214 (H) 08/29/2022 0229   HDL 29 (L) 08/29/2022 0229   CHOLHDL 3.8 08/29/2022 0229   VLDL 43 (H) 08/29/2022 0229   LDLCALC 37 08/29/2022 0229   LDLDIRECT 51 08/29/2022 0229    No components found for: "NTPROBNP" Recent Labs    09/30/22 1101 10/15/22 1426 11/02/22 1324 11/19/22 1435  PROBNP 5,409* 4,725* 9,659* 4,941*   Recent Labs    08/27/22 0819  TSH 2.142     BMP Recent Labs    08/27/22 0309 08/28/22 0642 08/29/22 0229 09/30/22 1101 10/15/22 1426 11/02/22 1326 11/19/22 1435  NA 133* 134* 135   < > 136 138 137  K 2.9* 3.4* 3.5   < > 3.2* 4.3 5.4*  CL 93* 103 106   < > 95* 100 101  CO2 '26 25 22   '$ < > '24 24 20  '$ GLUCOSE 327* 104* 150*   < > 212* 115* 215*  BUN 44* 34* 28*   < > 39* 24 37*  CREATININE 3.09* 2.51* 2.33*   < > 2.71* 2.27* 2.24*  CALCIUM 8.7* 8.5* 8.7*   < > 8.9 9.2 9.5  GFRNONAA 20* 26* 28*  --   --   --   --    < > = values in this interval not displayed.    HEMOGLOBIN A1C Lab Results  Component Value Date   HGBA1C 7.7 (H) 08/27/2022   MPG 174.29 08/27/2022   External Labs: Collected: 08/26/2022 provided by the patient performed at PCPs AST 24, ALT 33, alkaline phosphatase 46. BUN 40, creatinine 2.79. BUN/creatinine ratio 14.3. Chloride 95  External Labs: Office note dated 09/01/2022 by PCP noted in Care Everywhere  Sodium 137 136-145 mEq/L    Potassium 4.0 3.5-5.1 mEq/L    Chloride 100 98-107 mEq/L    CO2 29 21-32 mmol/L    Glucose 127 74-106 mg/dL    BUN 24 7-18 mg/dL    Creatinine 2.26 0.70-1.30 mg/dL    Calcium 9.4 8.5-10.1 mg/dL    BUN/Creatinine Ratio 10.6 11.0-26.0 Ratio    GFR/Black 31 >59 mL/min/1.54m    GFR/White 27 >59 mL/min/1.739m   External Labs: Collected: 12/07/2022. BUN 29, creatinine 1.87. Sodium 142, potassium 4.1, chloride 104, bicarb 23.  11/30/2022 Sodium 135, potassium 3.6, chloride 98, bicarb 20. BUN 44, creatinine 2.2 EGFR 30   IMPRESSION:  No diagnosis found.    RECOMMENDATIONS: DoCAYDIN YEATTSs a [age].o. Caucasian male whose past medical history and cardiac risk factors include: CAD status post CABG in 2012, LBBB, HTN, IDDM, hyperlipidemia, chronic kidney disease, polycythemia, history of COVID-19 infection, advanced age.  Chronic HFrEF (heart failure with reduced ejection fraction) (HCC) /ischemic cardiomyopathy Stage C, NYHA class II Clinically  euvolemic. Lost 4 # since the last office visit in October 2023 but proBNP treaded up.  Labs from 11/02/2022 - stable Cr and K levels.  Recent echocardiogram noted estimated RAP 3 mmHg and grade 1 diastolic impairment.  Continue current medications.  Will recheck labs and re-evaluate his symptoms in 2 weeks.  [facility]  Ideally, would like to uptitrate GDMT with close monitoring of renal function. Will consider ARB/ARNi at the next visit if renal function remains stable.   History of CABG with stable angina pectoris /status post  PCI SVG to RCA  Stable angina - with reduced frequency of symptoms.  Antianginal therapies include beta-blockers, Imdur, Ranexa, SL Nitro prn. Had referred him to Dr. Carlisle Cater at [facility] to discuss the role of enhanced extracorporeal counterpulsation (EECP) therapy either at [facility] or [facility] [place].  However, he has an appt to see Dr. Edwin Dada at [facility] next week.  Patient understands that if his symptoms are refractory to medical therapy he should go to the closet ER via EMS to further evaluation and if clinically warrant angiography should still be considered. He verbalizes understanding.   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 Recent labs reviewed.  Follow-up with nephrology.  Mixed hyperlipidemia Currently on rosuvastatin.   Will check fasting lipid prior to next visit.  He denies myalgia or other side effects.  FINAL MEDICATION LIST END OF ENCOUNTER: No orders of the defined types were placed in this encounter.   Medications Discontinued During This Encounter  Medication Reason  . furosemide (LASIX) 40 MG tablet Duplicate  . metoprolol succinate (TOPROL-XL) 50 MG 24 hr tablet Duplicate  . spironolactone (ALDACTONE) 25 MG tablet Duplicate  . potassium chloride SA (KLOR-CON M) 20 MEQ tablet Patient Preference      Current Outpatient  Medications:  .  acetaminophen (TYLENOL) 650 MG CR tablet, Take 1,300 mg by mouth in the morning and at bedtime., Disp: , Rfl:  .  aspirin EC 81 MG tablet, Take 81 mg by mouth at bedtime. , Disp: , Rfl:  .  B-D ULTRAFINE III SHORT PEN 31G X 8 MM MISC, USE UTD, Disp: , Rfl: 0 .  BRILINTA 90 MG TABS tablet, TAKE 1 TABLET(90 MG) BY MOUTH TWICE DAILY, Disp: 60 tablet, Rfl: 3 .  busPIRone (BUSPAR) 10 MG tablet, Take 20 mg by mouth 2 (two) times daily., Disp: , Rfl:  .  Calcium Citrate-Vitamin D 315-5 MG-MCG TABS, Take 1 tablet by mouth daily at 12 noon., Disp: , Rfl:  .  Docusate Sodium (GENTLE STOOL SOFTENER PO), Take 600 mg by mouth daily., Disp: , Rfl:  .  ENTRESTO 24-26 MG, Take 1 tablet by mouth 2 (two) times daily., Disp: , Rfl:  .  famotidine (PEPCID) 20 MG tablet, Take 1 tablet (20 mg total) by mouth at bedtime., Disp: 90 tablet, Rfl: 1 .  furosemide (LASIX) 40 MG tablet, Take 40 mg by mouth every other day. 1 tab TTHF and 2 tabs all other days., Disp: , Rfl:  .  insulin detemir (LEVEMIR) 100 UNIT/ML injection, Inject 0.16 mLs (16 Units total) into the skin 2 (two) times daily. (Patient taking differently: Inject 20-25 Units into the skin See admin instructions. Inject 20 units into the skin in the morning and 25 units at night), Disp: 10 mL, Rfl: 1 .  insulin lispro (HUMALOG) 100 UNIT/ML injection, Inject 0.05-0.09 mLs (5-9 Units total) into the skin 2 (two) times daily. 5 units at lunch, and 9 units after dinner. (Patient taking differently: Inject 5-15 Units into the skin 2 (two) times daily. 5-15 units as needed  (sliding scale insulin)), Disp: 10 mL, Rfl: 1 .  isosorbide mononitrate (IMDUR) 60 MG 24 hr tablet, Take 1 tablet (60 mg total) by mouth daily., Disp: 30 tablet, Rfl: 3 .  JARDIANCE 10 MG TABS tablet, TAKE 1 TABLET(10 MG) BY MOUTH DAILY BEFORE BREAKFAST, Disp: 90 tablet, Rfl: 0 .  levothyroxine (SYNTHROID) 88 MCG tablet, Take 88 mcg by mouth daily., Disp: , Rfl:  .  meclizine  (ANTIVERT) 25 MG tablet, Take 25 mg by mouth 3 (three) times daily as needed (for vertigo)., Disp: , Rfl:  .  metoprolol succinate (TOPROL XL) 50 MG 24 hr tablet, Take 1 tablet (50 mg total) by mouth daily. Take with or immediately following a meal. (Patient taking differently: Take 50 mg by mouth in the morning and at bedtime. Take with or immediately following a meal.), Disp: 90 tablet, Rfl: 1 .  Multiple Vitamins-Minerals (CENTRUM SILVER) tablet, Take 1 tablet by mouth daily., Disp: , Rfl:  .  nitroGLYCERIN (NITROSTAT) 0.4 MG SL tablet, Place 1 tablet (0.4 mg total) under the tongue every 5 (five) minutes as needed for chest pain., Disp: 90 tablet, Rfl: 3 .  ONE TOUCH ULTRA TEST test strip, , Disp: , Rfl:  .  pantoprazole (PROTONIX) 40 MG tablet, Take 40 mg by mouth at bedtime. , Disp: , Rfl:  .  ranolazine (RANEXA) 500 MG 12 hr tablet, Take 1 tablet (500 mg total) by mouth 2 (two) times daily., Disp: 180 tablet, Rfl: 1 .  rosuvastatin (CRESTOR) 40 MG tablet, Take 40 mg by mouth at bedtime., Disp: , Rfl:  .  sacubitril-valsartan (ENTRESTO) 24-26 MG, Take 1 tablet by mouth 2 (two) times daily., Disp: , Rfl:  .  spironolactone (ALDACTONE) 25 MG tablet, Take 25 mg by mouth daily., Disp: , Rfl:  .  nitroGLYCERIN (NITROSTAT) 0.4 MG SL tablet, Place 1 tablet (0.4 mg total) under the tongue every 5 (five) minutes as needed for chest pain., Disp: 25 tablet, Rfl: 3  No orders of the defined types were placed in this encounter.   There are no Patient Instructions on file for this visit.   --Continue cardiac medications as reconciled in final medication list. --Return in about 6 months (around 06/17/2023) for Follow up, heart failure management., CAD. or sooner if needed. --Continue follow-up with your primary care physician regarding the management of your other chronic comorbid conditions.  Patient's questions and concerns were addressed to his satisfaction. He voices understanding of the instructions  provided during this encounter.   This note was created using a voice recognition software as a result there may be grammatical errors inadvertently enclosed that do not reflect the nature of this encounter. Every attempt is made to correct such errors.  Rex Kras, [place], [facility]  Pager: [phone] Office: [phone]

## 2022-12-20 DIAGNOSIS — I5022 Chronic systolic (congestive) heart failure: Secondary | ICD-10-CM | POA: Diagnosis not present

## 2022-12-20 DIAGNOSIS — N189 Chronic kidney disease, unspecified: Secondary | ICD-10-CM | POA: Diagnosis not present

## 2022-12-20 DIAGNOSIS — N179 Acute kidney failure, unspecified: Secondary | ICD-10-CM | POA: Diagnosis not present

## 2022-12-20 DIAGNOSIS — D631 Anemia in chronic kidney disease: Secondary | ICD-10-CM | POA: Diagnosis not present

## 2022-12-20 DIAGNOSIS — E1122 Type 2 diabetes mellitus with diabetic chronic kidney disease: Secondary | ICD-10-CM | POA: Diagnosis not present

## 2022-12-20 DIAGNOSIS — I214 Non-ST elevation (NSTEMI) myocardial infarction: Secondary | ICD-10-CM | POA: Diagnosis not present

## 2022-12-20 DIAGNOSIS — I2510 Atherosclerotic heart disease of native coronary artery without angina pectoris: Secondary | ICD-10-CM | POA: Diagnosis not present

## 2022-12-20 DIAGNOSIS — I129 Hypertensive chronic kidney disease with stage 1 through stage 4 chronic kidney disease, or unspecified chronic kidney disease: Secondary | ICD-10-CM | POA: Diagnosis not present

## 2022-12-20 DIAGNOSIS — N184 Chronic kidney disease, stage 4 (severe): Secondary | ICD-10-CM | POA: Diagnosis not present

## 2022-12-27 ENCOUNTER — Ambulatory Visit: Payer: Medicare Other | Admitting: Podiatry

## 2022-12-28 ENCOUNTER — Ambulatory Visit: Payer: Medicare Other | Admitting: Podiatry

## 2022-12-28 DIAGNOSIS — D2371 Other benign neoplasm of skin of right lower limb, including hip: Secondary | ICD-10-CM | POA: Diagnosis not present

## 2022-12-28 DIAGNOSIS — M79676 Pain in unspecified toe(s): Secondary | ICD-10-CM | POA: Diagnosis not present

## 2022-12-28 DIAGNOSIS — D2372 Other benign neoplasm of skin of left lower limb, including hip: Secondary | ICD-10-CM | POA: Diagnosis not present

## 2022-12-28 DIAGNOSIS — D689 Coagulation defect, unspecified: Secondary | ICD-10-CM | POA: Diagnosis not present

## 2022-12-28 DIAGNOSIS — B351 Tinea unguium: Secondary | ICD-10-CM

## 2022-12-28 DIAGNOSIS — E119 Type 2 diabetes mellitus without complications: Secondary | ICD-10-CM | POA: Diagnosis not present

## 2022-12-28 NOTE — Progress Notes (Signed)
Presents today chief complaint of painful elongated toenails and fissures in his heels.  Objective: Vital signs are stable he is alert and oriented x 3.  Pulses are palpable toenails are long thick yellow dystrophic onychomycotic sharply incurvated painful palpation as well as debridement.  Evaluation of the plantar margin of the heel does demonstrate superficial heel fissures 2 of which appear to be scabbed over from previous bleeding.  Not currently active.  Assessment: Heel fissures or skin fissures bilateral feet.  Pain in limb secondary to onychomycosis.  Plan: Debridement of toenails 1 through 5 bilateral recommended the use of O'Keefe's foot cream for the heel fissures.

## 2023-01-06 ENCOUNTER — Other Ambulatory Visit: Payer: Self-pay | Admitting: Cardiology

## 2023-01-19 DIAGNOSIS — Z23 Encounter for immunization: Secondary | ICD-10-CM | POA: Diagnosis not present

## 2023-01-19 DIAGNOSIS — Z0000 Encounter for general adult medical examination without abnormal findings: Secondary | ICD-10-CM | POA: Diagnosis not present

## 2023-01-19 DIAGNOSIS — D692 Other nonthrombocytopenic purpura: Secondary | ICD-10-CM | POA: Diagnosis not present

## 2023-01-19 DIAGNOSIS — I25118 Atherosclerotic heart disease of native coronary artery with other forms of angina pectoris: Secondary | ICD-10-CM | POA: Diagnosis not present

## 2023-01-19 DIAGNOSIS — I700 Atherosclerosis of aorta: Secondary | ICD-10-CM | POA: Diagnosis not present

## 2023-01-19 DIAGNOSIS — J432 Centrilobular emphysema: Secondary | ICD-10-CM | POA: Diagnosis not present

## 2023-01-19 DIAGNOSIS — I5042 Chronic combined systolic (congestive) and diastolic (congestive) heart failure: Secondary | ICD-10-CM | POA: Diagnosis not present

## 2023-01-19 DIAGNOSIS — N1832 Chronic kidney disease, stage 3b: Secondary | ICD-10-CM | POA: Diagnosis not present

## 2023-01-19 DIAGNOSIS — E1121 Type 2 diabetes mellitus with diabetic nephropathy: Secondary | ICD-10-CM | POA: Diagnosis not present

## 2023-01-19 DIAGNOSIS — D751 Secondary polycythemia: Secondary | ICD-10-CM | POA: Diagnosis not present

## 2023-01-19 DIAGNOSIS — I130 Hypertensive heart and chronic kidney disease with heart failure and stage 1 through stage 4 chronic kidney disease, or unspecified chronic kidney disease: Secondary | ICD-10-CM | POA: Diagnosis not present

## 2023-01-26 DIAGNOSIS — E782 Mixed hyperlipidemia: Secondary | ICD-10-CM | POA: Diagnosis not present

## 2023-01-26 DIAGNOSIS — N1832 Chronic kidney disease, stage 3b: Secondary | ICD-10-CM | POA: Diagnosis not present

## 2023-01-26 DIAGNOSIS — D751 Secondary polycythemia: Secondary | ICD-10-CM | POA: Diagnosis not present

## 2023-01-26 DIAGNOSIS — I5042 Chronic combined systolic (congestive) and diastolic (congestive) heart failure: Secondary | ICD-10-CM | POA: Diagnosis not present

## 2023-01-26 DIAGNOSIS — I700 Atherosclerosis of aorta: Secondary | ICD-10-CM | POA: Diagnosis not present

## 2023-01-26 DIAGNOSIS — E1121 Type 2 diabetes mellitus with diabetic nephropathy: Secondary | ICD-10-CM | POA: Diagnosis not present

## 2023-01-26 DIAGNOSIS — I130 Hypertensive heart and chronic kidney disease with heart failure and stage 1 through stage 4 chronic kidney disease, or unspecified chronic kidney disease: Secondary | ICD-10-CM | POA: Diagnosis not present

## 2023-01-26 DIAGNOSIS — I25118 Atherosclerotic heart disease of native coronary artery with other forms of angina pectoris: Secondary | ICD-10-CM | POA: Diagnosis not present

## 2023-01-26 DIAGNOSIS — J432 Centrilobular emphysema: Secondary | ICD-10-CM | POA: Diagnosis not present

## 2023-01-26 DIAGNOSIS — I129 Hypertensive chronic kidney disease with stage 1 through stage 4 chronic kidney disease, or unspecified chronic kidney disease: Secondary | ICD-10-CM | POA: Diagnosis not present

## 2023-01-26 DIAGNOSIS — D692 Other nonthrombocytopenic purpura: Secondary | ICD-10-CM | POA: Diagnosis not present

## 2023-01-26 DIAGNOSIS — Z0000 Encounter for general adult medical examination without abnormal findings: Secondary | ICD-10-CM | POA: Diagnosis not present

## 2023-02-12 ENCOUNTER — Other Ambulatory Visit: Payer: Self-pay | Admitting: Cardiology

## 2023-02-17 ENCOUNTER — Other Ambulatory Visit: Payer: Self-pay

## 2023-02-17 MED ORDER — TICAGRELOR 90 MG PO TABS: ORAL_TABLET | ORAL | 3 refills | Status: DC

## 2023-02-17 MED ORDER — FUROSEMIDE 40 MG PO TABS: ORAL_TABLET | ORAL | 2 refills | Status: DC

## 2023-02-25 ENCOUNTER — Ambulatory Visit: Payer: Medicare Other | Admitting: Cardiology

## 2023-03-01 DIAGNOSIS — I25118 Atherosclerotic heart disease of native coronary artery with other forms of angina pectoris: Secondary | ICD-10-CM | POA: Diagnosis not present

## 2023-03-01 DIAGNOSIS — I130 Hypertensive heart and chronic kidney disease with heart failure and stage 1 through stage 4 chronic kidney disease, or unspecified chronic kidney disease: Secondary | ICD-10-CM | POA: Diagnosis not present

## 2023-03-01 DIAGNOSIS — N1832 Chronic kidney disease, stage 3b: Secondary | ICD-10-CM | POA: Diagnosis not present

## 2023-03-01 DIAGNOSIS — R0609 Other forms of dyspnea: Secondary | ICD-10-CM | POA: Diagnosis not present

## 2023-03-01 DIAGNOSIS — I5020 Unspecified systolic (congestive) heart failure: Secondary | ICD-10-CM | POA: Diagnosis not present

## 2023-03-07 DIAGNOSIS — N179 Acute kidney failure, unspecified: Secondary | ICD-10-CM | POA: Diagnosis not present

## 2023-03-08 ENCOUNTER — Other Ambulatory Visit: Payer: Self-pay | Admitting: Cardiology

## 2023-03-08 DIAGNOSIS — I5022 Chronic systolic (congestive) heart failure: Secondary | ICD-10-CM

## 2023-03-17 DIAGNOSIS — N179 Acute kidney failure, unspecified: Secondary | ICD-10-CM | POA: Diagnosis not present

## 2023-03-21 DIAGNOSIS — I129 Hypertensive chronic kidney disease with stage 1 through stage 4 chronic kidney disease, or unspecified chronic kidney disease: Secondary | ICD-10-CM | POA: Diagnosis not present

## 2023-03-21 DIAGNOSIS — D631 Anemia in chronic kidney disease: Secondary | ICD-10-CM | POA: Diagnosis not present

## 2023-03-21 DIAGNOSIS — N179 Acute kidney failure, unspecified: Secondary | ICD-10-CM | POA: Diagnosis not present

## 2023-03-21 DIAGNOSIS — I5022 Chronic systolic (congestive) heart failure: Secondary | ICD-10-CM | POA: Diagnosis not present

## 2023-03-21 DIAGNOSIS — I2510 Atherosclerotic heart disease of native coronary artery without angina pectoris: Secondary | ICD-10-CM | POA: Diagnosis not present

## 2023-03-21 DIAGNOSIS — N184 Chronic kidney disease, stage 4 (severe): Secondary | ICD-10-CM | POA: Diagnosis not present

## 2023-03-21 DIAGNOSIS — I214 Non-ST elevation (NSTEMI) myocardial infarction: Secondary | ICD-10-CM | POA: Diagnosis not present

## 2023-03-21 DIAGNOSIS — E1122 Type 2 diabetes mellitus with diabetic chronic kidney disease: Secondary | ICD-10-CM | POA: Diagnosis not present

## 2023-03-21 DIAGNOSIS — N189 Chronic kidney disease, unspecified: Secondary | ICD-10-CM | POA: Diagnosis not present

## 2023-03-25 ENCOUNTER — Encounter: Payer: Self-pay | Admitting: Cardiology

## 2023-03-26 ENCOUNTER — Other Ambulatory Visit: Payer: Self-pay | Admitting: Cardiology

## 2023-03-26 DIAGNOSIS — I25118 Atherosclerotic heart disease of native coronary artery with other forms of angina pectoris: Secondary | ICD-10-CM

## 2023-03-29 ENCOUNTER — Ambulatory Visit: Payer: Medicare Other | Admitting: Podiatry

## 2023-03-29 ENCOUNTER — Encounter: Payer: Self-pay | Admitting: Podiatry

## 2023-03-29 DIAGNOSIS — E119 Type 2 diabetes mellitus without complications: Secondary | ICD-10-CM | POA: Diagnosis not present

## 2023-03-29 DIAGNOSIS — D2371 Other benign neoplasm of skin of right lower limb, including hip: Secondary | ICD-10-CM | POA: Diagnosis not present

## 2023-03-29 DIAGNOSIS — D2372 Other benign neoplasm of skin of left lower limb, including hip: Secondary | ICD-10-CM

## 2023-03-29 DIAGNOSIS — M79676 Pain in unspecified toe(s): Secondary | ICD-10-CM

## 2023-03-29 DIAGNOSIS — B351 Tinea unguium: Secondary | ICD-10-CM

## 2023-03-29 DIAGNOSIS — D689 Coagulation defect, unspecified: Secondary | ICD-10-CM

## 2023-03-29 DIAGNOSIS — M7752 Other enthesopathy of left foot: Secondary | ICD-10-CM | POA: Diagnosis not present

## 2023-03-29 MED ORDER — DEXAMETHASONE SODIUM PHOSPHATE 120 MG/30ML IJ SOLN
2.0000 mg | Freq: Once | INTRAMUSCULAR | Status: AC
  Administered 2023-03-29: 2 mg via INTRA_ARTICULAR

## 2023-03-30 NOTE — Progress Notes (Signed)
He presents today chief complaint of painful elongated toenails and calluses bilaterally.  Denies any other changes.  Objective: Vital signs are stable alert oriented x 3.  Pulses are palpable.  There is no erythema edema cellulitis drainage or odor.  Toenails are long thick yellow dystrophic onychomycotic multiple benign skin lesions bilateral without skin breakdown or interruption.  Assessment: Pain in limb secondary to onychomycosis benign skin lesions.  Plan: Debridement of benign skin lesion debridement of toenails 1 through 5 bilateral.

## 2023-04-04 DIAGNOSIS — N184 Chronic kidney disease, stage 4 (severe): Secondary | ICD-10-CM | POA: Diagnosis not present

## 2023-04-12 DIAGNOSIS — E1165 Type 2 diabetes mellitus with hyperglycemia: Secondary | ICD-10-CM | POA: Diagnosis not present

## 2023-05-03 DIAGNOSIS — E1165 Type 2 diabetes mellitus with hyperglycemia: Secondary | ICD-10-CM | POA: Diagnosis not present

## 2023-05-04 ENCOUNTER — Other Ambulatory Visit: Payer: Self-pay | Admitting: Internal Medicine

## 2023-05-04 DIAGNOSIS — I25118 Atherosclerotic heart disease of native coronary artery with other forms of angina pectoris: Secondary | ICD-10-CM

## 2023-05-10 DIAGNOSIS — E1165 Type 2 diabetes mellitus with hyperglycemia: Secondary | ICD-10-CM | POA: Diagnosis not present

## 2023-05-15 ENCOUNTER — Other Ambulatory Visit: Payer: Self-pay | Admitting: Cardiology

## 2023-06-12 ENCOUNTER — Other Ambulatory Visit: Payer: Self-pay | Admitting: Cardiology

## 2023-06-12 DIAGNOSIS — I5022 Chronic systolic (congestive) heart failure: Secondary | ICD-10-CM

## 2023-06-17 ENCOUNTER — Ambulatory Visit: Payer: Medicare Other | Admitting: Cardiology

## 2023-06-17 ENCOUNTER — Encounter: Payer: Self-pay | Admitting: Cardiology

## 2023-06-17 VITALS — BP 110/60 | HR 71 | Resp 16 | Ht 75.0 in | Wt 173.2 lb

## 2023-06-17 DIAGNOSIS — I5022 Chronic systolic (congestive) heart failure: Secondary | ICD-10-CM | POA: Diagnosis not present

## 2023-06-17 DIAGNOSIS — Z951 Presence of aortocoronary bypass graft: Secondary | ICD-10-CM | POA: Diagnosis not present

## 2023-06-17 DIAGNOSIS — I447 Left bundle-branch block, unspecified: Secondary | ICD-10-CM | POA: Diagnosis not present

## 2023-06-17 DIAGNOSIS — I25118 Atherosclerotic heart disease of native coronary artery with other forms of angina pectoris: Secondary | ICD-10-CM

## 2023-06-17 DIAGNOSIS — Z955 Presence of coronary angioplasty implant and graft: Secondary | ICD-10-CM

## 2023-06-17 DIAGNOSIS — I255 Ischemic cardiomyopathy: Secondary | ICD-10-CM | POA: Diagnosis not present

## 2023-06-17 DIAGNOSIS — I10 Essential (primary) hypertension: Secondary | ICD-10-CM | POA: Diagnosis not present

## 2023-06-17 DIAGNOSIS — E782 Mixed hyperlipidemia: Secondary | ICD-10-CM

## 2023-06-17 MED ORDER — ISOSORBIDE MONONITRATE ER 30 MG PO TB24: 30.0000 mg | ORAL_TABLET | Freq: Every day | ORAL | Status: AC

## 2023-06-17 MED ORDER — FUROSEMIDE 40 MG PO TABS: 40.0000 mg | ORAL_TABLET | Freq: Every day | ORAL | Status: AC | PRN

## 2023-06-17 NOTE — Progress Notes (Signed)
ID:  MERWIN Young, DOB December 13, 1945, MRN [id number]  PCP:  Georgianne Fick, MD  Cardiologist:  [name], DO, [facility] (established care 08/26/2022) and Dr. Lupita Shutter at [facility] Cardiology Providers: Elvin So, [place] Nephrologist: Dr. Arrie Aran  Date: 06/17/23 Last Office Visit: 12/17/2022  Chief Complaint  Patient presents with   Follow-up   Coronary Artery Disease    HPI  Jesus Young is a 77 y.o. Caucasian male whose past medical history and cardiovascular risk factors include: CAD status post CABG in 2012, LBBB, HTN, IDDM, hyperlipidemia, chronic kidney disease, polycythemia, history of COVID-19 infection, advanced age.  Known history of CAD status post CABG in 2012.  His initial NSTEMI was in October 2022 when he underwent PCI to the SVG to RCA graft.  He was treated medically until his second NSTEMI in October 2023.  Echocardiogram illustrated newly reduced LVEF and we discussed early invasive angiography versus medical management but given his progressive chronic kidney disease patient preferred medical management as contrast exposure could hasten worsening CKD leading to end-stage renal disease.  Despite up titration of medical therapy he had anginal symptoms and therefore referred to [facility] for evaluation of EECP.  He established care with Dr. Lupita Shutter and has been under his care.  He too has been focusing on medical therapy given his renal function.  Since he resides in [place] shared decision was to follow-up every 6 months.  He presents today for follow-up.  He is not having any active chest pain and clinically is euvolemic and not in heart failure.  He still requires 1 sublingual nitroglycerin tablet on a daily basis.  This is usually at night when he is trying to go to sleep.  Patient almost think this is psychosomatic and not true anginal chest pain.  Patient states that since last office visit his calcium creatinine has been as high as 4 mg/dL.  Most  recent labs available in Care Everywhere reviewed.  He sees his nephrologist next week..  His office blood pressures were initially soft likely secondary to prolonged exposure to hot summer day.  Clinically denied any near-syncope or syncopal events.  ALLERGIES: Allergies  Allergen Reactions   Tetanus Antitoxin Other (See Comments)   Clonazepam Other (See Comments)    Headache   Lisinopril Other (See Comments)    headache    MEDICATION LIST PRIOR TO VISIT: Current Meds  Medication Sig   acetaminophen (TYLENOL) 650 MG CR tablet Take 1,300 mg by mouth in the morning and at bedtime.   aspirin EC 81 MG tablet Take 81 mg by mouth at bedtime.    B-D ULTRAFINE III SHORT PEN 31G X 8 MM MISC USE UTD   busPIRone (BUSPAR) 10 MG tablet Take 20 mg by mouth 2 (two) times daily.   Calcium Citrate-Vitamin D 315-5 MG-MCG TABS Take 1 tablet by mouth daily at 12 noon.   Docusate Sodium (GENTLE STOOL SOFTENER PO) Take 600 mg by mouth daily.   ENTRESTO 24-26 MG Take 1 tablet by mouth 2 (two) times daily.   famotidine (PEPCID) 20 MG tablet TAKE 1 TABLET(20 MG) BY MOUTH AT BEDTIME   HUMULIN N KWIKPEN 100 UNIT/ML KwikPen Inject into the skin.   insulin lispro (HUMALOG) 100 UNIT/ML injection Inject 0.05-0.09 mLs (5-9 Units total) into the skin 2 (two) times daily. 5 units at lunch, and 9 units after dinner. (Patient taking differently: Inject 5-15 Units into the skin 2 (two) times daily. 5-15 units as needed  (sliding scale insulin))  JARDIANCE 10 MG TABS tablet TAKE 1 TABLET(10 MG) BY MOUTH DAILY BEFORE BREAKFAST   levothyroxine (SYNTHROID) 88 MCG tablet Take 88 mcg by mouth daily.   meclizine (ANTIVERT) 25 MG tablet Take 25 mg by mouth 3 (three) times daily as needed (for vertigo).   metoprolol succinate (TOPROL-XL) 50 MG 24 hr tablet TAKE 1 TABLET(50 MG) BY MOUTH DAILY WITH OR IMMEDIATELY FOLLOWING A MEAL   Multiple Vitamins-Minerals (CENTRUM SILVER) tablet Take 1 tablet by mouth daily.   nitroGLYCERIN  (NITROSTAT) 0.4 MG SL tablet Place 1 tablet (0.4 mg total) under the tongue every 5 (five) minutes as needed for chest pain.   ONE TOUCH ULTRA TEST test strip    pantoprazole (PROTONIX) 40 MG tablet Take 40 mg by mouth at bedtime.    ranolazine (RANEXA) 500 MG 12 hr tablet TAKE 1 TABLET(500 MG) BY MOUTH TWICE DAILY   rosuvastatin (CRESTOR) 40 MG tablet Take 40 mg by mouth at bedtime.   ticagrelor (BRILINTA) 90 MG TABS tablet TAKE 1 TABLET(90 MG) BY MOUTH TWICE DAILY   [DISCONTINUED] furosemide (LASIX) 40 MG tablet TAKE 1 TABLET(40 MG) BY MOUTH DAILY   [DISCONTINUED] isosorbide mononitrate (IMDUR) 60 MG 24 hr tablet TAKE 1 TABLET(60 MG) BY MOUTH DAILY     PAST MEDICAL HISTORY: Past Medical History:  Diagnosis Date   Anxiety    Arthritis    back and knees   Bronchitis    hx of 2015   Cancer (HCC)    prostate   Cataract    immature on right   Chronic back pain    CKD (chronic kidney disease)    Constipation    takes Colace at bedtime   Coronary artery disease    takes Plavix daily but is on hold for surgery   Depression    Diabetes mellitus without complication (HCC)    takes Levemir and Humalog daily   Dizziness    Fall    hx of with injury to left shoulder    GERD (gastroesophageal reflux disease)    takes Protonix daily and Zantac   H/O urinary frequency    Hemorrhoids    History of bladder infections    History of kidney stones    Hyperlipidemia    takes Zocor every evening   Hypertension    takes Metoprolol daily   Hypothyroidism    takes Synthroid daily   Left bundle branch block    Myocardial infarction (HCC)    many yrs before 2002   Night muscle spasms    takes Robaxin 4 x day    Nocturia     PAST SURGICAL HISTORY: Past Surgical History:  Procedure Laterality Date   CARDIAC CATHETERIZATION  05/17/02   COLONOSCOPY     CORONARY ANGIOPLASTY     x 1    CORONARY ARTERY BYPASS GRAFT  2002   x 5   eyelid surgery      INGUINAL HERNIA REPAIR Left 05/06/2020    Procedure: LEFT INGUINAL HERNIA REPAIR;  Surgeon: Manus Rudd, MD;  Location: [facility] OR;  Service: General;  Laterality: Left;   INSERTION OF MESH Left 05/06/2020   Procedure: INSERTION OF MESH;  Surgeon: Manus Rudd, MD;  Location: [facility] OR;  Service: General;  Laterality: Left;   LEFT HEART CATH AND CORS/GRAFTS ANGIOGRAPHY N/A 12/05/2018   Procedure: LEFT HEART CATH AND CORS/GRAFTS ANGIOGRAPHY;  Surgeon: [name], MD;  Location: [facility] INVASIVE CV LAB;  Service: Cardiovascular;  Laterality: N/A;   LEFT HEART CATH AND  CORS/GRAFTS ANGIOGRAPHY N/A 08/21/2021   Procedure: LEFT HEART CATH AND CORS/GRAFTS ANGIOGRAPHY;  Surgeon: [name], MD;  Location: [facility] INVASIVE CV LAB;  Service: Cardiovascular;  Laterality: N/A;   LUMBAR LAMINECTOMY/DECOMPRESSION MICRODISCECTOMY Bilateral 07/18/2013   Procedure: Bilateral Lumbar four-five Lumbar Laminotomy/Microdiskectomy;  Surgeon: Hewitt Shorts, MD;  Location: [facility] NEURO ORS;  Service: Neurosurgery;  Laterality: Bilateral;  Bilateral Lumbar four-five Lumbar Laminotomy/Microdiskectomy   NEPHROLITHOTOMY Left 06/23/2015   Procedure: NEPHROLITHOTOMY PERCUTANEOUS;  Surgeon: Heloise Purpura, MD;  Location: [facility] ORS;  Service: Urology;  Laterality: Left;   PROSTATECTOMY  10/2009   skin spots removed from back -precancerous     TONSILLECTOMY      FAMILY HISTORY: The patient family history includes Heart attack in his father; Heart disease in his father; Thyroid disease in his sister.  SOCIAL HISTORY:  The patient  reports that he has never smoked. He has never used smokeless tobacco. He reports that he does not drink alcohol and does not use drugs.  REVIEW OF SYSTEMS: Review of Systems  Cardiovascular:  Positive for dyspnea on exertion (chronic and stable). Negative for chest pain, claudication, irregular heartbeat, leg swelling, near-syncope, orthopnea, palpitations, paroxysmal nocturnal dyspnea and syncope.  Respiratory:  Negative for shortness of breath.    Hematologic/Lymphatic: Negative for bleeding problem.  Musculoskeletal:  Negative for muscle cramps and myalgias.  Neurological:  Negative for dizziness and light-headedness.   PHYSICAL EXAM:    06/17/2023    2:15 PM 12/17/2022    2:45 PM 11/05/2022    2:02 PM  Vitals with BMI  Height 6\' 3"  6\' 3"  6\' 3"   Weight 173 lbs 3 oz 178 lbs 170 lbs 3 oz  BMI 21.65 22.25 21.27  Systolic 110 120 161  Diastolic 60 64 72  Pulse 71 74 99    Physical Exam  Constitutional: No distress.  Age appropriate, hemodynamically stable.   Neck: No JVD present.  Cardiovascular: Normal rate, regular rhythm, S1 normal, S2 normal, intact distal pulses and normal pulses. Exam reveals no gallop, no S3 and no S4.  No murmur heard. Pulses:      Dorsalis pedis pulses are 2+ on the right side and 2+ on the left side.       Posterior tibial pulses are 2+ on the right side and 2+ on the left side.  Pulmonary/Chest: Effort normal and breath sounds normal. No stridor. He has no wheezes. He has no rales.  Abdominal: Soft. Bowel sounds are normal. He exhibits no distension. There is no abdominal tenderness.  Musculoskeletal:        General: No edema.     Cervical back: Neck supple.  Neurological: He is alert and oriented to person, place, and time. He has intact cranial nerves (2-12).  Skin: Skin is warm and moist.   CARDIAC DATABASE: EKG: 06/17/2023: Sinus rhythm, 71 bpm, IVCD consistent with left bundle branch block, left axis, ST-T changes inferolateral leads cannot rule out ischemia.  Echocardiogram: 08/27/2022:  1. Left ventricular ejection fraction, by estimation, is 25 to 30%. The left ventricle has severely decreased function. The left ventricle demonstrates regional wall motion abnormalities (see scoring diagram/findings for description). The left ventricular internal cavity size was dilated. Left ventricular diastolic parameters are consistent with Grade I diastolic dysfunction (impaired relaxation).   2.  Right ventricular systolic function is mildly reduced. The right ventricular size is normal. There is normal pulmonary artery systolic pressure.   3. A small pericardial effusion is present.   4. The mitral valve is  grossly normal. Mild mitral valve regurgitation.   5. The aortic valve is tricuspid. Aortic valve regurgitation is not visualized. No aortic stenosis is present.   6. The inferior vena cava is normal in size with greater than 50% respiratory variability, suggesting right atrial pressure of 3 mmHg.   Stress Testing: Lexiscan Tetrofosmin stress test 11/24/2020: Lexiscan nuclear stress test performed using 1-day protocol. Rest and stress EKG showed sinus rhythm, IVCD/tachycardia. Normal myocardial perfusion. Stress LVEF 25%. High risk study due to low stress LVEF.  Heart Catheterization: 08/21/2021 LV: 124/7, EDP 13 mmHg.  Ao 122/70, mean 92 mmHg.  No pressure gradient across the aortic valve. LM: Diffusely diseased with occluded proximal LAD and proximal circumflex with minor branches evident. RCA: Occluded in the midsegment and severely diseased proximally. SVG to RCA: Proximal to mid segment has a ulcerated 80 to 90% stenosis.  Native vessel itself in the PL branch has a diffuse 90% stenosis and PDA has 60 to 70% stenosis.  This is unchanged from prior cardiac catheterization. Successful direct stenting with use of a spider filter wire for distal protection and implantation of a 4.0 x 16 mm Synergy XD at 16 atmospheric pressure, 60 seconds, stenosis reduced to 0%.  TIMI-3 to TIMI-3 flow. SVG to D1: Occluded. Free radial graft to OM1: Widely patent. LIMA to LAD: Widely patent.  There is mild to moderate disease in the native LAD which appears to have improved from prior cardiac catheterization.  Recommendation: Patient will be placed on dual antiplatelet therapy.  He will need to be on diuretics in the morning, please start Chauncey Mann next week once renal function is stable.  He can  potentially go home either tomorrow if renal function is stable and he is hemodynamically stable.  60 mL contrast utilized.  LABORATORY DATA:    Latest Ref Rng & Units 12/10/2022    5:00 PM 08/29/2022    2:29 AM 08/28/2022    6:42 AM  CBC  WBC 4.0 - 10.5 K/uL 5.6  5.8  5.6   Hemoglobin 13.0 - 17.0 g/dL 03.4  74.2  59.5   Hematocrit 39.0 - 52.0 % 44.1  35.4  36.1   Platelets 150 - 400 K/uL 161  167  170        Latest Ref Rng & Units 11/19/2022    2:35 PM 11/02/2022    1:26 PM 10/15/2022    2:26 PM  CMP  Glucose 70 - 99 mg/dL 638  756  433   BUN 8 - 27 mg/dL 37  24  39   Creatinine 0.76 - 1.27 mg/dL 2.95  1.88  4.16   Sodium 134 - 144 mmol/L 137  138  136   Potassium 3.5 - 5.2 mmol/L 5.4  4.3  3.2   Chloride 96 - 106 mmol/L 101  100  95   CO2 20 - 29 mmol/L 20  24  24    Calcium 8.6 - 10.2 mg/dL 9.5  9.2  8.9     Lipid Panel     Component Value Date/Time   CHOL 109 08/29/2022 0229   TRIG 214 (H) 08/29/2022 0229   HDL 29 (L) 08/29/2022 0229   CHOLHDL 3.8 08/29/2022 0229   VLDL 43 (H) 08/29/2022 0229   LDLCALC 37 08/29/2022 0229   LDLDIRECT 51 08/29/2022 0229    No components found for: "NTPROBNP" Recent Labs    09/30/22 1101 10/15/22 1426 11/02/22 1324 11/19/22 1435  PROBNP 5,409* 4,725* 9,659* 4,941*   Recent Labs  08/27/22 0819  TSH 2.142    BMP Recent Labs    08/27/22 0309 08/28/22 0642 08/29/22 0229 09/30/22 1101 10/15/22 1426 11/02/22 1326 11/19/22 1435  NA 133* 134* 135   < > 136 138 137  K 2.9* 3.4* 3.5   < > 3.2* 4.3 5.4*  CL 93* 103 106   < > 95* 100 101  CO2 26 25 22    < > 24 24 20   GLUCOSE 327* 104* 150*   < > 212* 115* 215*  BUN 44* 34* 28*   < > 39* 24 37*  CREATININE 3.09* 2.51* 2.33*   < > 2.71* 2.27* 2.24*  CALCIUM 8.7* 8.5* 8.7*   < > 8.9 9.2 9.5  GFRNONAA 20* 26* 28*  --   --   --   --    < > = values in this interval not displayed.    HEMOGLOBIN A1C Lab Results  Component Value Date   HGBA1C 7.7 (H) 08/27/2022   MPG  174.29 08/27/2022   External Labs: Collected: 08/26/2022 provided by the patient performed at [facility] AST 24, ALT 33, alkaline phosphatase 46. BUN 40, creatinine 2.79. BUN/creatinine ratio 14.3. Chloride 95  External Labs: Office note dated 09/01/2022 by PCP noted in Care Everywhere  Sodium 137 136-145 mEq/L    Potassium 4.0 3.5-5.1 mEq/L    Chloride 100 98-107 mEq/L    CO2 29 21-32 mmol/L    Glucose 127 74-106 mg/dL    BUN 24 1-61 mg/dL    Creatinine 0.96 0.45-4.09 mg/dL    Calcium 9.4 8.1-19.1 mg/dL    BUN/Creatinine Ratio 10.6 11.0-26.0 Ratio    GFR/Black 31 >59 mL/min/1.43m2    GFR/White 27 >59 mL/min/1.64m2    External Labs: Collected: 12/07/2022. BUN 29, creatinine 1.87. Sodium 142, potassium 4.1, chloride 104, bicarb 23.  11/30/2022 Sodium 135, potassium 3.6, chloride 98, bicarb 20. BUN 44, creatinine 2.2 EGFR 30  External Labs: Collected: Mar 17, 2023 available in Care Everywhere. Sodium 142, potassium 5, chloride 108, bicarb 19. BUN 30, creatinine 2.39. GFR 27  IMPRESSION:    ICD-10-CM   1. Chronic HFrEF (heart failure with reduced ejection fraction) (HCC)  I50.22 EKG 12-Lead    2. Ischemic cardiomyopathy  I25.5     3. Hx of CABG  Z95.1     4. Coronary artery disease of native artery of native heart with stable angina pectoris (HCC)  I25.118 isosorbide mononitrate (IMDUR) 30 MG 24 hr tablet    5. Status post insertion of drug eluting coronary artery stent  Z95.5     6. Mixed hyperlipidemia  E78.2     7. Benign hypertension  I10     8. LBBB (left bundle branch block)  I44.7         RECOMMENDATIONS: Jesus Young is a 77 y.o. Caucasian male whose past medical history and cardiac risk factors include: CAD status post CABG in 2012, LBBB, HTN, IDDM, hyperlipidemia, chronic kidney disease, polycythemia, history of COVID-19 infection, advanced age.  Chronic HFrEF (heart failure with reduced ejection fraction) (HCC) Ischemic cardiomyopathy Hx of  CABG Stage C, NYHA II Given soft blood pressures underlying chronic kidney disease shared decision was to down titrate medical therapy to help improve his blood pressures and preserve renal function. Will decrease isosorbide mononitrate from 60 mg p.o. daily to 30 mg p.o. daily. Currently uses Lasix 40 mg p.o. daily.  Will use Lasix on as needed basis. Patient states that his home weight is usually between 170-173 pounds.  Of asked him to use Lasix if his weight is more than 173 pounds and especially if he gains 1 to 1 pound over 24 hours or 3 pounds over a week. For now would defer diuresis management to nephrology who he sees next week Friday. Medications reconciled. Strict I's and O's and daily weights  Coronary artery disease of native artery of native heart with stable angina pectoris (HCC) Status post insertion of drug eluting coronary artery stent Denies anginal chest pain. EKG today shows sinus rhythm with ST-T changes.  Similar findings on prior EKG. Since he is asymptomatic we will hold off on invasive workup given his CKD.  Patient is agreeable with the plan of care Reduce the dose of Imdur for reasons mentioned above He continues to use sublingual nitroglycerin tablet -averaging 1 a day usually at night before he goes to sleep patient stating this is likely psychosomatic.  Monitor for now He will schedule follow-up appoint with Dr. Lupita Shutter.  Benign hypertension Office blood pressures are acceptable after rechecking.   Initially systolic blood pressures were 95 mmHg. Clinically denies near-syncope or syncopal events. Medication changes as noted above. I have asked him to change positions slowly and also to wear compression stockings when feasible and also avoid prolonged exposure in the hot summer months outside given the fact he is already on vasodilator therapy.   FINAL MEDICATION LIST END OF ENCOUNTER: Meds ordered this encounter  Medications   furosemide (LASIX) 40 MG  tablet    Sig: Take 1 tablet (40 mg total) by mouth daily as needed for fluid (If weight goes up by 1 pound over 24 hours or 3 pounds over a week). TAKE 1 TABLET(40 MG) BY MOUTH DAILY   isosorbide mononitrate (IMDUR) 30 MG 24 hr tablet    Sig: Take 1 tablet (30 mg total) by mouth daily.    Medications Discontinued During This Encounter  Medication Reason   insulin detemir (LEVEMIR) 100 UNIT/ML injection    spironolactone (ALDACTONE) 25 MG tablet    sacubitril-valsartan (ENTRESTO) 24-26 MG    isosorbide mononitrate (IMDUR) 60 MG 24 hr tablet    furosemide (LASIX) 40 MG tablet       Current Outpatient Medications:    acetaminophen (TYLENOL) 650 MG CR tablet, Take 1,300 mg by mouth in the morning and at bedtime., Disp: , Rfl:    aspirin EC 81 MG tablet, Take 81 mg by mouth at bedtime. , Disp: , Rfl:    B-D ULTRAFINE III SHORT PEN 31G X 8 MM MISC, USE UTD, Disp: , Rfl: 0   busPIRone (BUSPAR) 10 MG tablet, Take 20 mg by mouth 2 (two) times daily., Disp: , Rfl:    Calcium Citrate-Vitamin D 315-5 MG-MCG TABS, Take 1 tablet by mouth daily at 12 noon., Disp: , Rfl:    Docusate Sodium (GENTLE STOOL SOFTENER PO), Take 600 mg by mouth daily., Disp: , Rfl:    ENTRESTO 24-26 MG, Take 1 tablet by mouth 2 (two) times daily., Disp: , Rfl:    famotidine (PEPCID) 20 MG tablet, TAKE 1 TABLET(20 MG) BY MOUTH AT BEDTIME, Disp: 90 tablet, Rfl: 1   HUMULIN N KWIKPEN 100 UNIT/ML KwikPen, Inject into the skin., Disp: , Rfl:    insulin lispro (HUMALOG) 100 UNIT/ML injection, Inject 0.05-0.09 mLs (5-9 Units total) into the skin 2 (two) times daily. 5 units at lunch, and 9 units after dinner. (Patient taking differently: Inject 5-15 Units into the skin 2 (two) times daily. 5-15 units as needed  (  sliding scale insulin)), Disp: 10 mL, Rfl: 1   JARDIANCE 10 MG TABS tablet, TAKE 1 TABLET(10 MG) BY MOUTH DAILY BEFORE BREAKFAST, Disp: 90 tablet, Rfl: 0   levothyroxine (SYNTHROID) 88 MCG tablet, Take 88 mcg by mouth daily.,  Disp: , Rfl:    meclizine (ANTIVERT) 25 MG tablet, Take 25 mg by mouth 3 (three) times daily as needed (for vertigo)., Disp: , Rfl:    metoprolol succinate (TOPROL-XL) 50 MG 24 hr tablet, TAKE 1 TABLET(50 MG) BY MOUTH DAILY WITH OR IMMEDIATELY FOLLOWING A MEAL, Disp: 90 tablet, Rfl: 1   Multiple Vitamins-Minerals (CENTRUM SILVER) tablet, Take 1 tablet by mouth daily., Disp: , Rfl:    nitroGLYCERIN (NITROSTAT) 0.4 MG SL tablet, Place 1 tablet (0.4 mg total) under the tongue every 5 (five) minutes as needed for chest pain., Disp: 25 tablet, Rfl: 3   ONE TOUCH ULTRA TEST test strip, , Disp: , Rfl:    pantoprazole (PROTONIX) 40 MG tablet, Take 40 mg by mouth at bedtime. , Disp: , Rfl:    ranolazine (RANEXA) 500 MG 12 hr tablet, TAKE 1 TABLET(500 MG) BY MOUTH TWICE DAILY, Disp: 180 tablet, Rfl: 1   rosuvastatin (CRESTOR) 40 MG tablet, Take 40 mg by mouth at bedtime., Disp: , Rfl:    ticagrelor (BRILINTA) 90 MG TABS tablet, TAKE 1 TABLET(90 MG) BY MOUTH TWICE DAILY, Disp: 60 tablet, Rfl: 3   furosemide (LASIX) 40 MG tablet, Take 1 tablet (40 mg total) by mouth daily as needed for fluid (If weight goes up by 1 pound over 24 hours or 3 pounds over a week). TAKE 1 TABLET(40 MG) BY MOUTH DAILY, Disp: , Rfl:    isosorbide mononitrate (IMDUR) 30 MG 24 hr tablet, Take 1 tablet (30 mg total) by mouth daily., Disp: , Rfl:    nitroGLYCERIN (NITROSTAT) 0.4 MG SL tablet, Place 1 tablet (0.4 mg total) under the tongue every 5 (five) minutes as needed for chest pain., Disp: 90 tablet, Rfl: 3  Orders Placed This Encounter  Procedures   EKG 12-Lead    There are no Patient Instructions on file for this visit.   --Continue cardiac medications as reconciled in final medication list. --Return in about 6 months (around 12/18/2023) for Follow up, CAD, heart failure management.. or sooner if needed. --Continue follow-up with your primary care physician regarding the management of your other chronic comorbid  conditions.  Patient's questions and concerns were addressed to his satisfaction. He voices understanding of the instructions provided during this encounter.   This note was created using a voice recognition software as a result there may be grammatical errors inadvertently enclosed that do not reflect the nature of this encounter. Every attempt is made to correct such errors.  [name], [place], [facility]  Pager:  [phone] Office: [phone]

## 2023-06-21 ENCOUNTER — Other Ambulatory Visit: Payer: Self-pay | Admitting: Cardiology

## 2023-06-21 DIAGNOSIS — E1165 Type 2 diabetes mellitus with hyperglycemia: Secondary | ICD-10-CM | POA: Diagnosis not present

## 2023-06-24 DIAGNOSIS — I129 Hypertensive chronic kidney disease with stage 1 through stage 4 chronic kidney disease, or unspecified chronic kidney disease: Secondary | ICD-10-CM | POA: Diagnosis not present

## 2023-06-24 DIAGNOSIS — D631 Anemia in chronic kidney disease: Secondary | ICD-10-CM | POA: Diagnosis not present

## 2023-06-24 DIAGNOSIS — N189 Chronic kidney disease, unspecified: Secondary | ICD-10-CM | POA: Diagnosis not present

## 2023-06-24 DIAGNOSIS — N179 Acute kidney failure, unspecified: Secondary | ICD-10-CM | POA: Diagnosis not present

## 2023-06-24 DIAGNOSIS — I5022 Chronic systolic (congestive) heart failure: Secondary | ICD-10-CM | POA: Diagnosis not present

## 2023-06-24 DIAGNOSIS — I214 Non-ST elevation (NSTEMI) myocardial infarction: Secondary | ICD-10-CM | POA: Diagnosis not present

## 2023-06-24 DIAGNOSIS — E1122 Type 2 diabetes mellitus with diabetic chronic kidney disease: Secondary | ICD-10-CM | POA: Diagnosis not present

## 2023-06-24 DIAGNOSIS — I2510 Atherosclerotic heart disease of native coronary artery without angina pectoris: Secondary | ICD-10-CM | POA: Diagnosis not present

## 2023-06-24 DIAGNOSIS — N184 Chronic kidney disease, stage 4 (severe): Secondary | ICD-10-CM | POA: Diagnosis not present

## 2023-06-25 ENCOUNTER — Other Ambulatory Visit: Payer: Self-pay | Admitting: Cardiology

## 2023-06-28 ENCOUNTER — Encounter: Payer: Self-pay | Admitting: Podiatry

## 2023-06-28 ENCOUNTER — Ambulatory Visit: Payer: Medicare Other | Admitting: Podiatry

## 2023-06-28 DIAGNOSIS — D2371 Other benign neoplasm of skin of right lower limb, including hip: Secondary | ICD-10-CM | POA: Diagnosis not present

## 2023-06-28 DIAGNOSIS — D689 Coagulation defect, unspecified: Secondary | ICD-10-CM | POA: Diagnosis not present

## 2023-06-28 DIAGNOSIS — M79676 Pain in unspecified toe(s): Secondary | ICD-10-CM

## 2023-06-28 DIAGNOSIS — E119 Type 2 diabetes mellitus without complications: Secondary | ICD-10-CM | POA: Diagnosis not present

## 2023-06-28 DIAGNOSIS — B351 Tinea unguium: Secondary | ICD-10-CM

## 2023-06-28 DIAGNOSIS — D2372 Other benign neoplasm of skin of left lower limb, including hip: Secondary | ICD-10-CM

## 2023-06-28 NOTE — Progress Notes (Signed)
He presents today chief complaint of pain in his toenails with his painful calluses bilateral foot.  Objective: Vital signs are stable he is alert oriented x 3 pulses are palpable.  Toenails are long thick yellow dystrophic onychomycotic painful benign skin lesions.  Assessment: Pain in limb secondary to onychomycosis benign skin lesions first and fifth bilateral.  Plan: Debridement of toenails and fine skin lesions.

## 2023-06-29 NOTE — Telephone Encounter (Signed)
Done

## 2023-07-08 DIAGNOSIS — N184 Chronic kidney disease, stage 4 (severe): Secondary | ICD-10-CM | POA: Diagnosis not present

## 2023-07-08 DIAGNOSIS — I129 Hypertensive chronic kidney disease with stage 1 through stage 4 chronic kidney disease, or unspecified chronic kidney disease: Secondary | ICD-10-CM | POA: Diagnosis not present

## 2023-07-16 ENCOUNTER — Encounter (HOSPITAL_COMMUNITY)
Admission: EM | Disposition: A | Discharge status: Home / Self Care | Payer: Self-pay | Source: Home / Self Care | Attending: Cardiology

## 2023-07-16 ENCOUNTER — Observation Stay (HOSPITAL_COMMUNITY)
Admission: EM | Admit: 2023-07-16 | Discharge: 2023-07-17 | Disposition: A | Discharge status: Home / Self Care | Payer: Medicare Other | Attending: Cardiology | Admitting: Cardiology

## 2023-07-16 DIAGNOSIS — Z79899 Other long term (current) drug therapy: Secondary | ICD-10-CM | POA: Insufficient documentation

## 2023-07-16 DIAGNOSIS — I447 Left bundle-branch block, unspecified: Secondary | ICD-10-CM | POA: Diagnosis not present

## 2023-07-16 DIAGNOSIS — I5022 Chronic systolic (congestive) heart failure: Secondary | ICD-10-CM

## 2023-07-16 DIAGNOSIS — E785 Hyperlipidemia, unspecified: Secondary | ICD-10-CM | POA: Insufficient documentation

## 2023-07-16 DIAGNOSIS — N184 Chronic kidney disease, stage 4 (severe): Secondary | ICD-10-CM | POA: Diagnosis not present

## 2023-07-16 DIAGNOSIS — Z8546 Personal history of malignant neoplasm of prostate: Secondary | ICD-10-CM | POA: Diagnosis not present

## 2023-07-16 DIAGNOSIS — E1122 Type 2 diabetes mellitus with diabetic chronic kidney disease: Secondary | ICD-10-CM | POA: Diagnosis not present

## 2023-07-16 DIAGNOSIS — Z951 Presence of aortocoronary bypass graft: Secondary | ICD-10-CM | POA: Diagnosis not present

## 2023-07-16 DIAGNOSIS — Z794 Long term (current) use of insulin: Secondary | ICD-10-CM | POA: Diagnosis not present

## 2023-07-16 DIAGNOSIS — I25110 Atherosclerotic heart disease of native coronary artery with unstable angina pectoris: Principal | ICD-10-CM | POA: Insufficient documentation

## 2023-07-16 DIAGNOSIS — R0789 Other chest pain: Secondary | ICD-10-CM | POA: Diagnosis not present

## 2023-07-16 DIAGNOSIS — I200 Unstable angina: Principal | ICD-10-CM | POA: Diagnosis present

## 2023-07-16 DIAGNOSIS — I499 Cardiac arrhythmia, unspecified: Secondary | ICD-10-CM | POA: Diagnosis not present

## 2023-07-16 DIAGNOSIS — M79603 Pain in arm, unspecified: Secondary | ICD-10-CM | POA: Diagnosis not present

## 2023-07-16 DIAGNOSIS — Z743 Need for continuous supervision: Secondary | ICD-10-CM | POA: Diagnosis not present

## 2023-07-16 DIAGNOSIS — Z7982 Long term (current) use of aspirin: Secondary | ICD-10-CM | POA: Diagnosis not present

## 2023-07-16 DIAGNOSIS — I130 Hypertensive heart and chronic kidney disease with heart failure and stage 1 through stage 4 chronic kidney disease, or unspecified chronic kidney disease: Secondary | ICD-10-CM | POA: Diagnosis not present

## 2023-07-16 DIAGNOSIS — I25118 Atherosclerotic heart disease of native coronary artery with other forms of angina pectoris: Secondary | ICD-10-CM

## 2023-07-16 HISTORY — PX: CORONARY/GRAFT ACUTE MI REVASCULARIZATION: CATH118305

## 2023-07-16 LAB — HEMOGLOBIN A1C
Hgb A1c MFr Bld: 7.6 % — ABNORMAL HIGH (ref 4.8–5.6)
Mean Plasma Glucose: 171.42 mg/dL

## 2023-07-16 LAB — CBC
HCT: 38.3 % — ABNORMAL LOW (ref 39.0–52.0)
Hemoglobin: 12.8 g/dL — ABNORMAL LOW (ref 13.0–17.0)
MCH: 32.6 pg (ref 26.0–34.0)
MCHC: 33.4 g/dL (ref 30.0–36.0)
MCV: 97.5 fL (ref 80.0–100.0)
Platelets: 158 10*3/uL (ref 150–400)
RBC: 3.93 MIL/uL — ABNORMAL LOW (ref 4.22–5.81)
RDW: 14.6 % (ref 11.5–15.5)
WBC: 4.8 10*3/uL (ref 4.0–10.5)
nRBC: 0 % (ref 0.0–0.2)

## 2023-07-16 LAB — COMPREHENSIVE METABOLIC PANEL
ALT: 22 U/L (ref 0–44)
AST: 23 U/L (ref 15–41)
Albumin: 3.5 g/dL (ref 3.5–5.0)
Alkaline Phosphatase: 42 U/L (ref 38–126)
Anion gap: 6 (ref 5–15)
BUN: 31 mg/dL — ABNORMAL HIGH (ref 8–23)
CO2: 19 mmol/L — ABNORMAL LOW (ref 22–32)
Calcium: 8.6 mg/dL — ABNORMAL LOW (ref 8.9–10.3)
Chloride: 110 mmol/L (ref 98–111)
Creatinine, Ser: 2.76 mg/dL — ABNORMAL HIGH (ref 0.61–1.24)
GFR, Estimated: 23 mL/min — ABNORMAL LOW (ref >60)
Glucose, Bld: 172 mg/dL — ABNORMAL HIGH (ref 70–99)
Potassium: 4.2 mmol/L (ref 3.5–5.1)
Sodium: 135 mmol/L (ref 135–145)
Total Bilirubin: 0.6 mg/dL (ref 0.3–1.2)
Total Protein: 5.8 g/dL — ABNORMAL LOW (ref 6.5–8.1)

## 2023-07-16 LAB — TROPONIN I (HIGH SENSITIVITY)
Troponin I (High Sensitivity): 19 ng/L — ABNORMAL HIGH (ref <18)
Troponin I (High Sensitivity): 104 ng/L (ref <18)

## 2023-07-16 LAB — GLUCOSE, CAPILLARY
Glucose-Capillary: 156 mg/dL — ABNORMAL HIGH (ref 70–99)
Glucose-Capillary: 283 mg/dL — ABNORMAL HIGH (ref 70–99)

## 2023-07-16 LAB — MRSA NEXT GEN BY PCR, NASAL: MRSA by PCR Next Gen: NOT DETECTED

## 2023-07-16 SURGERY — CORONARY/GRAFT ACUTE MI REVASCULARIZATION
Anesthesia: LOCAL

## 2023-07-16 MED ORDER — BUSPIRONE HCL 10 MG PO TABS
20.0000 mg | ORAL_TABLET | Freq: Two times a day (BID) | ORAL | Status: DC
  Administered 2023-07-16 – 2023-07-17 (×2): 20 mg via ORAL
  Filled 2023-07-16 (×2): qty 2

## 2023-07-16 MED ORDER — ADULT MULTIVITAMIN W/MINERALS CH
1.0000 | ORAL_TABLET | Freq: Every day | ORAL | Status: DC
  Administered 2023-07-17: 1 via ORAL
  Filled 2023-07-16: qty 1

## 2023-07-16 MED ORDER — HEPARIN (PORCINE) 25000 UT/250ML-% IV SOLN
1100.0000 [IU]/h | INTRAVENOUS | Status: DC
  Administered 2023-07-17: 1100 [IU]/h via INTRAVENOUS
  Filled 2023-07-16: qty 250

## 2023-07-16 MED ORDER — INSULIN ASPART 100 UNIT/ML IJ SOLN
0.0000 [IU] | Freq: Three times a day (TID) | INTRAMUSCULAR | Status: DC
  Administered 2023-07-17: 5 [IU] via SUBCUTANEOUS
  Administered 2023-07-17: 3 [IU] via SUBCUTANEOUS

## 2023-07-16 MED ORDER — SODIUM CHLORIDE 0.9 % IV SOLN: INTRAVENOUS | Status: AC

## 2023-07-16 MED ORDER — ROSUVASTATIN CALCIUM 20 MG PO TABS
40.0000 mg | ORAL_TABLET | Freq: Every day | ORAL | Status: DC
  Administered 2023-07-16: 40 mg via ORAL
  Filled 2023-07-16: qty 2

## 2023-07-16 MED ORDER — NITROGLYCERIN 0.4 MG SL SUBL: 0.4000 mg | SUBLINGUAL_TABLET | SUBLINGUAL | Status: DC | PRN

## 2023-07-16 MED ORDER — LEVOTHYROXINE SODIUM 88 MCG PO TABS
88.0000 ug | ORAL_TABLET | Freq: Every day | ORAL | Status: DC
  Administered 2023-07-17: 88 ug via ORAL
  Filled 2023-07-16: qty 1

## 2023-07-16 MED ORDER — CHLORHEXIDINE GLUCONATE CLOTH 2 % EX PADS
6.0000 | MEDICATED_PAD | Freq: Every day | CUTANEOUS | Status: DC
  Administered 2023-07-16 – 2023-07-17 (×2): 6 via TOPICAL

## 2023-07-16 MED ORDER — MECLIZINE HCL 25 MG PO TABS: 25.0000 mg | ORAL_TABLET | Freq: Three times a day (TID) | ORAL | Status: DC | PRN

## 2023-07-16 MED ORDER — MIDAZOLAM HCL 2 MG/2ML IJ SOLN
INTRAMUSCULAR | Status: AC
  Filled 2023-07-16: qty 2

## 2023-07-16 MED ORDER — HEPARIN (PORCINE) IN NACL 1000-0.9 UT/500ML-% IV SOLN
INTRAVENOUS | Status: DC | PRN
  Administered 2023-07-16 (×2): 500 mL

## 2023-07-16 MED ORDER — FENTANYL CITRATE (PF) 100 MCG/2ML IJ SOLN
INTRAMUSCULAR | Status: AC
  Filled 2023-07-16: qty 2

## 2023-07-16 MED ORDER — TICAGRELOR 90 MG PO TABS
90.0000 mg | ORAL_TABLET | Freq: Two times a day (BID) | ORAL | Status: DC
  Administered 2023-07-16 – 2023-07-17 (×2): 90 mg via ORAL
  Filled 2023-07-16 (×2): qty 1

## 2023-07-16 MED ORDER — MIDAZOLAM HCL 2 MG/2ML IJ SOLN
INTRAMUSCULAR | Status: DC | PRN
  Administered 2023-07-16: 1 mg via INTRAVENOUS

## 2023-07-16 MED ORDER — ASPIRIN 81 MG PO TBEC
81.0000 mg | DELAYED_RELEASE_TABLET | Freq: Every day | ORAL | Status: DC
  Administered 2023-07-16: 81 mg via ORAL
  Filled 2023-07-16: qty 1

## 2023-07-16 MED ORDER — PANTOPRAZOLE SODIUM 40 MG PO TBEC
40.0000 mg | DELAYED_RELEASE_TABLET | Freq: Every day | ORAL | Status: DC
  Administered 2023-07-16: 40 mg via ORAL
  Filled 2023-07-16: qty 1

## 2023-07-16 MED ORDER — ACETAMINOPHEN 325 MG PO TABS
650.0000 mg | ORAL_TABLET | ORAL | Status: DC | PRN
  Administered 2023-07-16: 650 mg via ORAL
  Filled 2023-07-16: qty 2

## 2023-07-16 MED ORDER — OYSTER SHELL CALCIUM/D3 500-5 MG-MCG PO TABS
1.0000 | ORAL_TABLET | Freq: Every day | ORAL | Status: DC
  Administered 2023-07-17: 1 via ORAL
  Filled 2023-07-16: qty 1

## 2023-07-16 MED ORDER — VERAPAMIL HCL 2.5 MG/ML IV SOLN
INTRAVENOUS | Status: AC
  Filled 2023-07-16: qty 2

## 2023-07-16 MED ORDER — HEPARIN SODIUM (PORCINE) 1000 UNIT/ML IJ SOLN
INTRAMUSCULAR | Status: AC
  Filled 2023-07-16: qty 10

## 2023-07-16 MED ORDER — EMPAGLIFLOZIN 10 MG PO TABS
10.0000 mg | ORAL_TABLET | Freq: Every day | ORAL | Status: DC
  Administered 2023-07-17: 10 mg via ORAL
  Filled 2023-07-16: qty 1

## 2023-07-16 MED ORDER — SODIUM CHLORIDE 0.9 % IV SOLN: 250.0000 mL | INTRAVENOUS | Status: DC | PRN

## 2023-07-16 MED ORDER — LIDOCAINE HCL (PF) 1 % IJ SOLN
INTRAMUSCULAR | Status: DC | PRN
  Administered 2023-07-16: 15 mL via INTRADERMAL

## 2023-07-16 MED ORDER — LIDOCAINE HCL (PF) 1 % IJ SOLN
INTRAMUSCULAR | Status: AC
  Filled 2023-07-16: qty 30

## 2023-07-16 MED ORDER — FAMOTIDINE 20 MG PO TABS
20.0000 mg | ORAL_TABLET | Freq: Every day | ORAL | Status: DC
  Administered 2023-07-17: 20 mg via ORAL
  Filled 2023-07-16 (×2): qty 1

## 2023-07-16 MED ORDER — SODIUM CHLORIDE 0.9% FLUSH
3.0000 mL | Freq: Two times a day (BID) | INTRAVENOUS | Status: DC
  Administered 2023-07-16 – 2023-07-17 (×2): 3 mL via INTRAVENOUS

## 2023-07-16 MED ORDER — HYDRALAZINE HCL 20 MG/ML IJ SOLN: 10.0000 mg | INTRAMUSCULAR | Status: AC | PRN

## 2023-07-16 MED ORDER — INSULIN ASPART 100 UNIT/ML IJ SOLN
0.0000 [IU] | Freq: Every day | INTRAMUSCULAR | Status: DC
  Administered 2023-07-16: 3 [IU] via SUBCUTANEOUS

## 2023-07-16 MED ORDER — FENTANYL CITRATE PF 50 MCG/ML IJ SOSY
25.0000 ug | PREFILLED_SYRINGE | INTRAMUSCULAR | Status: DC | PRN
  Administered 2023-07-16: 25 ug via INTRAVENOUS
  Filled 2023-07-16: qty 1

## 2023-07-16 MED ORDER — IOHEXOL 350 MG/ML SOLN
INTRAVENOUS | Status: DC | PRN
  Administered 2023-07-16: 8 mL

## 2023-07-16 MED ORDER — DIAZEPAM 5 MG PO TABS
5.0000 mg | ORAL_TABLET | ORAL | Status: DC | PRN
  Administered 2023-07-16: 5 mg via ORAL
  Filled 2023-07-16: qty 1

## 2023-07-16 MED ORDER — METOPROLOL SUCCINATE ER 50 MG PO TB24
50.0000 mg | ORAL_TABLET | Freq: Every day | ORAL | Status: DC
  Administered 2023-07-17: 50 mg via ORAL
  Filled 2023-07-16: qty 1

## 2023-07-16 MED ORDER — RANOLAZINE ER 500 MG PO TB12
500.0000 mg | ORAL_TABLET | Freq: Two times a day (BID) | ORAL | Status: DC
  Administered 2023-07-16 – 2023-07-17 (×2): 500 mg via ORAL
  Filled 2023-07-16 (×2): qty 1

## 2023-07-16 MED ORDER — FENTANYL CITRATE (PF) 100 MCG/2ML IJ SOLN
INTRAMUSCULAR | Status: DC | PRN
  Administered 2023-07-16 (×2): 25 ug via INTRAVENOUS

## 2023-07-16 MED ORDER — SODIUM CHLORIDE 0.9% FLUSH: 3.0000 mL | INTRAVENOUS | Status: DC | PRN

## 2023-07-16 SURGICAL SUPPLY — 8 items
CATH INFINITI 5 FR AR1 MOD (CATHETERS) IMPLANT
CATH INFINITI 5FR MULTPACK ANG (CATHETERS) IMPLANT
ELECT DEFIB PAD ADLT CADENCE (PAD) IMPLANT
KIT HEART LEFT (KITS) IMPLANT
KIT MICROPUNCTURE NIT STIFF (SHEATH) IMPLANT
PACK CARDIAC CATHETERIZATION (CUSTOM PROCEDURE TRAY) ×1 IMPLANT
SHEATH PINNACLE 6F 10CM (SHEATH) IMPLANT
WIRE EMERALD 3MM-J .035X150CM (WIRE) IMPLANT

## 2023-07-16 NOTE — Progress Notes (Addendum)
ANTICOAGULATION CONSULT NOTE - Initial Consult  Pharmacy Consult for heparin Indication: chest pain/ACS  Allergies  Allergen Reactions   Tetanus Antitoxin Other (See Comments)   Clonazepam Other (See Comments)    Headache   Lisinopril Other (See Comments)    headache    Patient Measurements: Wt: ~ 78 kg Ht 6' 3''  Vital Signs:    Labs: No results for input(s): "HGB", "HCT", "PLT", "APTT", "LABPROT", "INR", "HEPARINUNFRC", "HEPRLOWMOCWT", "CREATININE", "CKTOTAL", "CKMB", "TROPONINIHS" in the last 72 hours.  CrCl cannot be calculated (Patient's most recent lab result is older than the maximum 21 days allowed.).   Medical History: Past Medical History:  Diagnosis Date   Anxiety    Arthritis    back and knees   Bronchitis    hx of 2015   Cancer [facility])    prostate   Cataract    immature on right   Chronic back pain    CKD (chronic kidney disease)    Constipation    takes Colace at bedtime   Coronary artery disease    takes Plavix daily but is on hold for surgery   Depression    Diabetes mellitus without complication (HCC)    takes Levemir and Humalog daily   Dizziness    Fall    hx of with injury to left shoulder    GERD (gastroesophageal reflux disease)    takes Protonix daily and Zantac   H/O urinary frequency    Hemorrhoids    History of bladder infections    History of kidney stones    Hyperlipidemia    takes Zocor every evening   Hypertension    takes Metoprolol daily   Hypothyroidism    takes Synthroid daily   Left bundle branch block    Myocardial infarction (HCC)    many yrs before 2002   Night muscle spasms    takes Robaxin 4 x day    Nocturia     Medications:  Medications Prior to Admission  Medication Sig Dispense Refill Last Dose   acetaminophen (TYLENOL) 650 MG CR tablet Take 1,300 mg by mouth in the morning and at bedtime.      aspirin EC 81 MG tablet Take 81 mg by mouth at bedtime.       B-D ULTRAFINE III SHORT PEN 31G X 8 MM MISC  USE UTD  0    BRILINTA 90 MG TABS tablet TAKE 1 TABLET(90 MG) BY MOUTH TWICE DAILY 60 tablet 3    busPIRone (BUSPAR) 10 MG tablet Take 20 mg by mouth 2 (two) times daily.      Calcium Citrate-Vitamin D 315-5 MG-MCG TABS Take 1 tablet by mouth daily at 12 noon.      Docusate Sodium (GENTLE STOOL SOFTENER PO) Take 600 mg by mouth daily.      ENTRESTO 24-26 MG Take 1 tablet by mouth 2 (two) times daily.      famotidine (PEPCID) 20 MG tablet TAKE 1 TABLET(20 MG) BY MOUTH AT BEDTIME 90 tablet 1    furosemide (LASIX) 40 MG tablet Take 1 tablet (40 mg total) by mouth daily as needed for fluid (If weight goes up by 1 pound over 24 hours or 3 pounds over a week). TAKE 1 TABLET(40 MG) BY MOUTH DAILY      HUMULIN N KWIKPEN 100 UNIT/ML KwikPen Inject into the skin.      insulin lispro (HUMALOG) 100 UNIT/ML injection Inject 0.05-0.09 mLs (5-9 Units total) into the skin 2 (two) times daily.  5 units at lunch, and 9 units after dinner. (Patient taking differently: Inject 5-15 Units into the skin 2 (two) times daily. 5-15 units as needed  (sliding scale insulin)) 10 mL 1    isosorbide mononitrate (IMDUR) 30 MG 24 hr tablet Take 1 tablet (30 mg total) by mouth daily.      JARDIANCE 10 MG TABS tablet TAKE 1 TABLET(10 MG) BY MOUTH DAILY BEFORE BREAKFAST 90 tablet 0    levothyroxine (SYNTHROID) 88 MCG tablet Take 88 mcg by mouth daily.      meclizine (ANTIVERT) 25 MG tablet Take 25 mg by mouth 3 (three) times daily as needed (for vertigo).      metoprolol succinate (TOPROL-XL) 50 MG 24 hr tablet TAKE 1 TABLET(50 MG) BY MOUTH DAILY WITH OR IMMEDIATELY FOLLOWING A MEAL 90 tablet 1    Multiple Vitamins-Minerals (CENTRUM SILVER) tablet Take 1 tablet by mouth daily.      nitroGLYCERIN (NITROSTAT) 0.4 MG SL tablet Place 1 tablet (0.4 mg total) under the tongue every 5 (five) minutes as needed for chest pain. 25 tablet 3    nitroGLYCERIN (NITROSTAT) 0.4 MG SL tablet Place 1 tablet (0.4 mg total) under the tongue every 5 (five)  minutes as needed for chest pain. 90 tablet 3    ONE TOUCH ULTRA TEST test strip       pantoprazole (PROTONIX) 40 MG tablet Take 40 mg by mouth at bedtime.       ranolazine (RANEXA) 500 MG 12 hr tablet TAKE 1 TABLET(500 MG) BY MOUTH TWICE DAILY 180 tablet 1    rosuvastatin (CRESTOR) 40 MG tablet Take 40 mg by mouth at bedtime.      Scheduled:   aspirin EC  81 mg Oral QHS   busPIRone  20 mg Oral BID   [START ON 07/17/2023] Calcium Citrate-Vitamin D  1 tablet Oral Q1200   Centrum Silver  1 tablet Oral Daily   empagliflozin  10 mg Oral Daily   famotidine  20 mg Oral Daily   insulin aspart  0-15 Units Subcutaneous TID WC   insulin aspart  0-5 Units Subcutaneous QHS   levothyroxine  88 mcg Oral Daily   metoprolol succinate  50 mg Oral Daily   pantoprazole  40 mg Oral QHS   ranolazine  500 mg Oral BID   rosuvastatin  40 mg Oral QHS   sodium chloride flush  3 mL Intravenous Q12H   ticagrelor  90 mg Oral BID    Assessment: 76 yo male here w/ CP and with history of CABG and PCI. He is post cath and plans for medical management. Pharmacy consulted to dose heparin. No anticoagulants noted PTA.  -labs pending -sheath remove ~ 4: 15pm  Goal of Therapy:  Heparin level 0.3-0.7 units/ml Monitor platelets by anticoagulation protocol: Yes   Plan:  -Start heparin at 1050 units/hr 8 hrs after sheath removal -heparin level in 8 hrs -Daily heparin level and CBC  Harland German, PharmD Clinical Pharmacist **Pharmacist phone directory can now be found on [email or web address] ([email or web address]).  Listed under [facility].

## 2023-07-16 NOTE — H&P (Signed)
CARDIOLOGY ADMIT NOTE   [name] MRN: [id number] DOB/AGE: 1946-10-01 77 y.o.  Admit date: 07/16/2023 Primary Physician:  Georgianne Fick, MD  [name], male    DOB: 31-Oct-1946, 77 y.o.   MRN: [id number]  Chest pain since 12:30 PM 07/16/23   HPI:    Jesus Young  is a 77 y.o. Caucasian male patient with CAD SP CABG in 2012, LBBB, hypertension, IDDM, stage IV chronic kidney disease, hypercholesterolemia past cardiac catheterization in October 2022 with PCI to SVG to RCA.  He has been having anginal symptoms and was aggressively managed medically in the outpatient basis due to chronic kidney disease.  This afternoon he started having chest pain and took 3 sublingual nitroglycerin without relief, he was clammy, continued chest pain, EMS was brought in and revealed new ST depressions in the inferior leads with suspicion for recurrent occlusion of right coronary artery graft.  He was evaluated in the cardiac catheterization lab as he was having active chest pain he was brought directly to the Cath Lab.  After discussions, patient was still having pain and he was very clammy initially did not want to proceed with cardiac catheterization in view of renal issues, I discussed with him that we could certainly continue medical therapy as I suspect graft to the right coronary artery to be down and low risk for arrhythmias and arrhythmic death were explained to the patient and also need for emergent cardiac catheterization if the pain got worse.  Patient agreed to proceed with cardiac catheterization and is aware that he may need dialysis.  Past Medical History:  Diagnosis Date   Anxiety    Arthritis    back and knees   Bronchitis    hx of 2015   Cancer (HCC)    prostate   Cataract    immature on right   Chronic back pain    CKD (chronic kidney disease)    Constipation    takes Colace at bedtime   Coronary artery disease    takes Plavix  daily but is on hold for surgery   Depression    Diabetes mellitus without complication (HCC)    takes Levemir and Humalog daily   Dizziness    Fall    hx of with injury to left shoulder    GERD (gastroesophageal reflux disease)    takes Protonix daily and Zantac   H/O urinary frequency    Hemorrhoids    History of bladder infections    History of kidney stones    Hyperlipidemia    takes Zocor every evening   Hypertension    takes Metoprolol daily   Hypothyroidism    takes Synthroid daily   Left bundle branch block    Myocardial infarction [facility])    many yrs before 2002   Night muscle spasms    takes Robaxin 4 x day    Nocturia    Past Surgical History:  Procedure Laterality Date   CARDIAC CATHETERIZATION  05/17/02   COLONOSCOPY     CORONARY ANGIOPLASTY     x 1    CORONARY ARTERY BYPASS GRAFT  2002   x 5   eyelid surgery      INGUINAL HERNIA REPAIR Left 05/06/2020   Procedure: LEFT INGUINAL HERNIA REPAIR;  Surgeon: Manus Rudd, MD;  Location: [facility] OR;  Service: General;  Laterality: Left;   INSERTION OF MESH Left 05/06/2020   Procedure: INSERTION OF MESH;  Surgeon: Manus Rudd, MD;  Location: [facility] OR;  Service: General;  Laterality: Left;   LEFT HEART CATH AND CORS/GRAFTS ANGIOGRAPHY N/A 12/05/2018   Procedure: LEFT HEART CATH AND CORS/GRAFTS ANGIOGRAPHY;  Surgeon: [name], MD;  Location: [facility] INVASIVE CV LAB;  Service: Cardiovascular;  Laterality: N/A;   LEFT HEART CATH AND CORS/GRAFTS ANGIOGRAPHY N/A 08/21/2021   Procedure: LEFT HEART CATH AND CORS/GRAFTS ANGIOGRAPHY;  Surgeon: [name], MD;  Location: [facility] INVASIVE CV LAB;  Service: Cardiovascular;  Laterality: N/A;   LUMBAR LAMINECTOMY/DECOMPRESSION MICRODISCECTOMY Bilateral 07/18/2013   Procedure: Bilateral Lumbar four-five Lumbar Laminotomy/Microdiskectomy;  Surgeon: Hewitt Shorts, MD;  Location: [facility] NEURO ORS;  Service: Neurosurgery;  Laterality: Bilateral;  Bilateral Lumbar four-five Lumbar Laminotomy/Microdiskectomy    NEPHROLITHOTOMY Left 06/23/2015   Procedure: NEPHROLITHOTOMY PERCUTANEOUS;  Surgeon: Heloise Purpura, MD;  Location: [facility] ORS;  Service: Urology;  Laterality: Left;   PROSTATECTOMY  10/2009   skin spots removed from back -precancerous     TONSILLECTOMY     Social History   Socioeconomic History   Marital status: Married    Spouse name: Bonita Quin   Number of children: 0   Years of education: Not on file   Highest education level: Not on file  Occupational History   Not on file  Tobacco Use   Smoking status: Never   Smokeless tobacco: Never  Vaping Use   Vaping status: Never Used  Substance and Sexual Activity   Alcohol use: No   Drug use: No   Sexual activity: Not Currently  Other Topics Concern   Not on file  Social History Narrative   Not on file   Social Determinants of Health   Financial Resource Strain: Not on file  Food Insecurity: No Food Insecurity (08/27/2022)   Hunger Vital Sign    Worried About Running Out of Food in the Last Year: Never true    Ran Out of Food in the Last Year: Never true  Transportation Needs: No Transportation Needs (08/27/2022)   PRAPARE - Administrator, Civil Service (Medical): No    Lack of Transportation (Non-Medical): No  Physical Activity: Not on file  Stress: Not on file  Social Connections: Not on file  Intimate Partner Violence: Not At Risk (08/27/2022)   Humiliation, Afraid, Rape, and Kick questionnaire    Fear of Current or Ex-Partner: No    Emotionally Abused: No    Physically Abused: No    Sexually Abused: No   Family History  Problem Relation Age of Onset   Heart disease Father    Heart attack Father    Thyroid disease Sister     ROS  Review of Systems  Cardiovascular:  Positive for chest pain. Negative for dyspnea on exertion and leg swelling.   Objective      06/17/2023    2:15 PM 12/17/2022    2:45 PM 11/05/2022    2:02 PM  Vitals with BMI  Height 6\' 3"  6\' 3"  6\' 3"   Weight 173 lbs 3 oz 178 lbs 170  lbs 3 oz  BMI 21.65 22.25 21.27  Systolic 110 120 323  Diastolic 60 64 72  Pulse 71 74 99    Physical Exam Neck:     Vascular: No carotid bruit or JVD.  Cardiovascular:     Rate and Rhythm: Normal rate and regular rhythm.     Pulses: Intact distal pulses.     Heart sounds: Normal heart sounds. No murmur heard.    No gallop.  Pulmonary:  Effort: Pulmonary effort is normal.     Breath sounds: Normal breath sounds.  Abdominal:     General: Bowel sounds are normal.     Palpations: Abdomen is soft.  Musculoskeletal:     Right lower leg: No edema.     Left lower leg: No edema.    Laboratory examination:   Recent Labs    08/27/22 0309 08/28/22 0642 08/29/22 0229 09/30/22 1101 10/15/22 1426 11/02/22 1326 11/19/22 1435  NA 133* 134* 135   < > 136 138 137  K 2.9* 3.4* 3.5   < > 3.2* 4.3 5.4*  CL 93* 103 106   < > 95* 100 101  CO2 26 25 22    < > 24 24 20   GLUCOSE 327* 104* 150*   < > 212* 115* 215*  BUN 44* 34* 28*   < > 39* 24 37*  CREATININE 3.09* 2.51* 2.33*   < > 2.71* 2.27* 2.24*  CALCIUM 8.7* 8.5* 8.7*   < > 8.9 9.2 9.5  GFRNONAA 20* 26* 28*  --   --   --   --    < > = values in this interval not displayed.   CrCl cannot be calculated (Patient's most recent lab result is older than the maximum 21 days allowed.).     Latest Ref Rng & Units 11/19/2022    2:35 PM 11/02/2022    1:26 PM 10/15/2022    2:26 PM  CMP  Glucose 70 - 99 mg/dL 098  119  147   BUN 8 - 27 mg/dL 37  24  39   Creatinine 0.76 - 1.27 mg/dL 8.29  5.62  1.30   Sodium 134 - 144 mmol/L 137  138  136   Potassium 3.5 - 5.2 mmol/L 5.4  4.3  3.2   Chloride 96 - 106 mmol/L 101  100  95   CO2 20 - 29 mmol/L 20  24  24    Calcium 8.6 - 10.2 mg/dL 9.5  9.2  8.9       Latest Ref Rng & Units 12/10/2022    5:00 PM 08/29/2022    2:29 AM 08/28/2022    6:42 AM  CBC  WBC 4.0 - 10.5 K/uL 5.6  5.8  5.6   Hemoglobin 13.0 - 17.0 g/dL 86.5  78.4  69.6   Hematocrit 39.0 - 52.0 % 44.1  35.4  36.1   Platelets 150  - 400 K/uL 161  167  170    Lipid Panel     Component Value Date/Time   CHOL 109 08/29/2022 0229   TRIG 214 (H) 08/29/2022 0229   HDL 29 (L) 08/29/2022 0229   CHOLHDL 3.8 08/29/2022 0229   VLDL 43 (H) 08/29/2022 0229   LDLCALC 37 08/29/2022 0229   LDLDIRECT 51 08/29/2022 0229   HEMOGLOBIN A1C Lab Results  Component Value Date   HGBA1C 7.7 (H) 08/27/2022   MPG 174.29 08/27/2022   TSH Recent Labs    08/27/22 0819  TSH 2.142   BNP (last 3 results) Recent Labs    08/27/22 1656 08/29/22 0253  BNP 972.2* 1,350.9*   Cardiac Panel (last 3 results) No results for input(s): "CKTOTAL", "CKMB", "TROPONINIHS", "RELINDX" in the last 72 hours.   Medications and allergies   Allergies  Allergen Reactions   Tetanus Antitoxin Other (See Comments)   Clonazepam Other (See Comments)    Headache   Lisinopril Other (See Comments)    headache     Current Outpatient Medications  Medication Instructions   acetaminophen (TYLENOL) 1,300 mg, Oral, 2 times daily   aspirin EC 81 mg, Oral, Daily at bedtime   B-D ULTRAFINE III SHORT PEN 31G X 8 MM MISC USE UTD   BRILINTA 90 MG TABS tablet TAKE 1 TABLET(90 MG) BY MOUTH TWICE DAILY   busPIRone (BUSPAR) 20 mg, Oral, 2 times daily   Calcium Citrate-Vitamin D 315-5 MG-MCG TABS 1 tablet, Oral, Daily   Docusate Sodium (GENTLE STOOL SOFTENER PO) 600 mg, Oral, Daily   ENTRESTO 24-26 MG 1 tablet, Oral, 2 times daily   famotidine (PEPCID) 20 MG tablet TAKE 1 TABLET(20 MG) BY MOUTH AT BEDTIME   furosemide (LASIX) 40 mg, Oral, Daily PRN, TAKE 1 TABLET(40 MG) BY MOUTH DAILY   HUMULIN N KWIKPEN 100 UNIT/ML KwikPen Subcutaneous   insulin lispro (HUMALOG) 5-9 Units, Subcutaneous, 2 times daily, 5 units at lunch, and 9 units after dinner.   isosorbide mononitrate (IMDUR) 30 mg, Oral, Daily   JARDIANCE 10 MG TABS tablet TAKE 1 TABLET(10 MG) BY MOUTH DAILY BEFORE BREAKFAST   levothyroxine (SYNTHROID) 88 mcg, Oral, Daily   meclizine (ANTIVERT) 25 mg, Oral, 3  times daily PRN   metoprolol succinate (TOPROL-XL) 50 MG 24 hr tablet TAKE 1 TABLET(50 MG) BY MOUTH DAILY WITH OR IMMEDIATELY FOLLOWING A MEAL   Multiple Vitamins-Minerals (CENTRUM SILVER) tablet 1 tablet, Oral, Daily   nitroGLYCERIN (NITROSTAT) 0.4 mg, Sublingual, Every 5 min PRN   nitroGLYCERIN (NITROSTAT) 0.4 mg, Sublingual, Every 5 min PRN   ONE TOUCH ULTRA TEST test strip No dose, route, or frequency recorded.   pantoprazole (PROTONIX) 40 mg, Oral, Daily at bedtime   ranolazine (RANEXA) 500 MG 12 hr tablet TAKE 1 TABLET(500 MG) BY MOUTH TWICE DAILY   rosuvastatin (CRESTOR) 40 mg, Oral, Daily at bedtime    No intake/output data recorded. No intake/output data recorded.    Radiology:  No results found.  Cardiac Studies:   Echocardiogram 08/27/2022:  1. Left ventricular ejection fraction, by estimation, is 25 to 30%. The left ventricle has severely decreased function. The left ventricle demonstrates regional wall motion abnormalities (see scoring diagram/findings for description). The left ventricular internal cavity size was dilated. Left ventricular diastolic parameters are consistent with Grade I diastolic dysfunction (impaired relaxation).   2. Right ventricular systolic function is mildly reduced. The right ventricular size is normal. There is normal pulmonary artery systolic pressure.   3. A small pericardial effusion is present.   4. The mitral valve is grossly normal. Mild mitral valve regurgitation.   5. The aortic valve is tricuspid. Aortic valve regurgitation is not visualized. No aortic stenosis is present.   6. The inferior vena cava is normal in size with greater than 50% respiratory variability, suggesting right atrial pressure of 3 mmHg.  Stress Testing: Lexiscan Tetrofosmin stress test 11/24/2020: Lexiscan nuclear stress test performed using 1-day protocol. Rest and stress EKG showed sinus rhythm, IVCD/tachycardia. Normal myocardial perfusion. Stress LVEF 25%. High  risk study due to low stress LVEF.  Heart Catheterization: 08/21/2021 LV: 124/7, EDP 13 mmHg.  Ao 122/70, mean 92 mmHg.  No pressure gradient across the aortic valve. LM: Diffusely diseased with occluded proximal LAD and proximal circumflex with minor branches evident. RCA: Occluded in the midsegment and severely diseased proximally. SVG to RCA: Proximal to mid segment has a ulcerated 80 to 90% stenosis.  Native vessel itself in the PL branch has a diffuse 90% stenosis and PDA has 60 to 70% stenosis.  This is unchanged from prior  cardiac catheterization. Successful direct stenting with use of a spider filter wire for distal protection and implantation of a 4.0 x 16 mm Synergy XD at 16 atmospheric pressure, 60 seconds, stenosis reduced to 0%.  TIMI-3 to TIMI-3 flow. SVG to D1: Occluded. Free radial graft to OM1: Widely patent. LIMA to LAD: Widely patent.  There is mild to moderate disease in the native LAD which appears to have improved from prior cardiac catheterization.  EKG 07/16/2023: Normal sinus rhythm, inferior 2 mm ST segment depression new compared to prior EKG on 09/06/2022, LVH with repolarization abnormality that is unchanged, atypical LBBB.  Assessment   1.  Unstable angina, possible NSTEMI inferior wall 2.  CAD of the native vessel and bypass graft with history of CABG 3.  Stage IV chronic kidney disease  Recommendations:   As discussed above, extensive discussion with the patient and the cardiac catheterization lab prior to proceeding with cardiac catheterization whether we should do medical therapy versus cardiac catheterization, he agrees to proceed with catheterization with minimal contrast use, my plan is to directly visualize saphenous vein graft to the right coronary artery and if it is indeed the culprit, we will probably forego relook at other vessels.  Patient was loaded with IV fluids while in the Cath Lab.  Further recommendations to follow.   [name], MD,  [facility] 07/16/2023, 2:54 PM Office: [phone] Fax: [phone] Pager: [phone]

## 2023-07-16 NOTE — Progress Notes (Signed)
Will stop heparin in 12-24 hours if no chest pain,if recurrent chest pain and in view of chronic stable angina, PLA branch stenosis main culprit, can do PCI with limited contrast on Tuesday after hydration or on an elective basis.

## 2023-07-16 NOTE — Interval H&P Note (Signed)
History and Physical Interval Note:  07/16/2023 3:03 PM  Jesus Young  has presented today for surgery, with the diagnosis of STEMI.  The various methods of treatment have been discussed with the patient and family. After consideration of risks, benefits and other options for treatment, the patient has consented to  Procedure(s): Coronary/Graft Acute MI Revascularization (N/A) as a surgical intervention.  The patient's history has been reviewed, patient examined, no change in status, stable for surgery.  I have reviewed the patient's chart and labs.  Questions were answered to the patient's satisfaction.     [name]

## 2023-07-17 ENCOUNTER — Other Ambulatory Visit: Payer: Self-pay

## 2023-07-17 ENCOUNTER — Observation Stay (HOSPITAL_COMMUNITY): Payer: Medicare Other

## 2023-07-17 ENCOUNTER — Encounter (HOSPITAL_COMMUNITY): Payer: Self-pay | Admitting: Cardiology

## 2023-07-17 DIAGNOSIS — E785 Hyperlipidemia, unspecified: Secondary | ICD-10-CM | POA: Diagnosis not present

## 2023-07-17 DIAGNOSIS — I130 Hypertensive heart and chronic kidney disease with heart failure and stage 1 through stage 4 chronic kidney disease, or unspecified chronic kidney disease: Secondary | ICD-10-CM | POA: Diagnosis not present

## 2023-07-17 DIAGNOSIS — Z955 Presence of coronary angioplasty implant and graft: Secondary | ICD-10-CM | POA: Diagnosis not present

## 2023-07-17 DIAGNOSIS — I5022 Chronic systolic (congestive) heart failure: Secondary | ICD-10-CM

## 2023-07-17 DIAGNOSIS — Z951 Presence of aortocoronary bypass graft: Secondary | ICD-10-CM

## 2023-07-17 DIAGNOSIS — N184 Chronic kidney disease, stage 4 (severe): Secondary | ICD-10-CM | POA: Diagnosis not present

## 2023-07-17 DIAGNOSIS — I25118 Atherosclerotic heart disease of native coronary artery with other forms of angina pectoris: Secondary | ICD-10-CM

## 2023-07-17 DIAGNOSIS — Z8546 Personal history of malignant neoplasm of prostate: Secondary | ICD-10-CM | POA: Diagnosis not present

## 2023-07-17 DIAGNOSIS — I447 Left bundle-branch block, unspecified: Secondary | ICD-10-CM | POA: Diagnosis not present

## 2023-07-17 DIAGNOSIS — E1122 Type 2 diabetes mellitus with diabetic chronic kidney disease: Secondary | ICD-10-CM | POA: Diagnosis not present

## 2023-07-17 DIAGNOSIS — I255 Ischemic cardiomyopathy: Secondary | ICD-10-CM | POA: Diagnosis not present

## 2023-07-17 DIAGNOSIS — E782 Mixed hyperlipidemia: Secondary | ICD-10-CM | POA: Diagnosis not present

## 2023-07-17 DIAGNOSIS — Z794 Long term (current) use of insulin: Secondary | ICD-10-CM | POA: Diagnosis not present

## 2023-07-17 DIAGNOSIS — Z7982 Long term (current) use of aspirin: Secondary | ICD-10-CM | POA: Diagnosis not present

## 2023-07-17 DIAGNOSIS — I25110 Atherosclerotic heart disease of native coronary artery with unstable angina pectoris: Secondary | ICD-10-CM | POA: Diagnosis not present

## 2023-07-17 DIAGNOSIS — E1165 Type 2 diabetes mellitus with hyperglycemia: Secondary | ICD-10-CM | POA: Diagnosis not present

## 2023-07-17 DIAGNOSIS — Z79899 Other long term (current) drug therapy: Secondary | ICD-10-CM | POA: Diagnosis not present

## 2023-07-17 LAB — LIPID PANEL
Cholesterol: 101 mg/dL (ref 0–200)
HDL: 28 mg/dL — ABNORMAL LOW (ref >40)
LDL Cholesterol: 38 mg/dL (ref 0–99)
Total CHOL/HDL Ratio: 3.6 ratio
Triglycerides: 176 mg/dL — ABNORMAL HIGH (ref <150)
VLDL: 35 mg/dL (ref 0–40)

## 2023-07-17 LAB — BASIC METABOLIC PANEL
Anion gap: 9 (ref 5–15)
BUN: 31 mg/dL — ABNORMAL HIGH (ref 8–23)
CO2: 19 mmol/L — ABNORMAL LOW (ref 22–32)
Calcium: 8.7 mg/dL — ABNORMAL LOW (ref 8.9–10.3)
Chloride: 109 mmol/L (ref 98–111)
Creatinine, Ser: 2.65 mg/dL — ABNORMAL HIGH (ref 0.61–1.24)
GFR, Estimated: 24 mL/min — ABNORMAL LOW (ref >60)
Glucose, Bld: 154 mg/dL — ABNORMAL HIGH (ref 70–99)
Potassium: 4.2 mmol/L (ref 3.5–5.1)
Sodium: 137 mmol/L (ref 135–145)

## 2023-07-17 LAB — GLUCOSE, CAPILLARY
Glucose-Capillary: 75 mg/dL (ref 70–99)
Glucose-Capillary: 154 mg/dL — ABNORMAL HIGH (ref 70–99)
Glucose-Capillary: 223 mg/dL — ABNORMAL HIGH (ref 70–99)
Glucose-Capillary: 242 mg/dL — ABNORMAL HIGH (ref 70–99)

## 2023-07-17 LAB — HEPARIN LEVEL (UNFRACTIONATED): Heparin Unfractionated: 0.37 [IU]/mL (ref 0.30–0.70)

## 2023-07-17 LAB — ECHOCARDIOGRAM COMPLETE
Area-P 1/2: 3.53 cm2
Calc EF: 34.5 %
S' Lateral: 4.9 cm
Single Plane A2C EF: 36.9 %
Single Plane A4C EF: 32.7 %
Weight: 2736 oz

## 2023-07-17 LAB — LDL CHOLESTEROL, DIRECT: Direct LDL: 42 mg/dL (ref 0–99)

## 2023-07-17 LAB — CBC
HCT: 39.5 % (ref 39.0–52.0)
Hemoglobin: 12.5 g/dL — ABNORMAL LOW (ref 13.0–17.0)
MCH: 31.6 pg (ref 26.0–34.0)
MCHC: 31.6 g/dL (ref 30.0–36.0)
MCV: 100 fL (ref 80.0–100.0)
Platelets: 162 10*3/uL (ref 150–400)
RBC: 3.95 MIL/uL — ABNORMAL LOW (ref 4.22–5.81)
RDW: 14.6 % (ref 11.5–15.5)
WBC: 5.1 10*3/uL (ref 4.0–10.5)
nRBC: 0 % (ref 0.0–0.2)

## 2023-07-17 MED ORDER — NITROGLYCERIN 0.4 MG SL SUBL: 0.4000 mg | SUBLINGUAL_TABLET | SUBLINGUAL | 12 refills | Status: DC | PRN

## 2023-07-17 MED ORDER — RANOLAZINE ER 500 MG PO TB12
500.0000 mg | ORAL_TABLET | Freq: Every day | ORAL | Status: DC
  Filled 2023-07-17: qty 1

## 2023-07-17 MED ORDER — RANOLAZINE ER 500 MG PO TB12: 500.0000 mg | ORAL_TABLET | Freq: Every day | ORAL | Status: DC

## 2023-07-17 MED ORDER — DOCUSATE SODIUM 100 MG PO CAPS
100.0000 mg | ORAL_CAPSULE | Freq: Every day | ORAL | Status: DC | PRN
  Filled 2023-07-17: qty 1

## 2023-07-17 MED ORDER — PERFLUTREN LIPID MICROSPHERE
1.0000 mL | INTRAVENOUS | Status: AC | PRN
  Administered 2023-07-17: 3 mL via INTRAVENOUS

## 2023-07-17 NOTE — Progress Notes (Signed)
*  PRELIMINARY RESULTS* Echocardiogram 2D Echocardiogram has been performed with Definity.  Stacey Drain 07/17/2023, 1:33 PM

## 2023-07-17 NOTE — Plan of Care (Signed)

## 2023-07-17 NOTE — Care Management Obs Status (Signed)
MEDICARE OBSERVATION STATUS NOTIFICATION   Patient Details  Name: Jesus Young MRN: [id number] Date of Birth: 1946-06-08   Medicare Observation Status Notification Given:  Yes    Lawerance Sabal, RN 07/17/2023, 4:06 PM

## 2023-07-17 NOTE — Care Management CC44 (Signed)
Condition Code 44 Documentation Completed  Patient Details  Name: Jesus Young MRN: [id number] Date of Birth: [birth year]   Condition Code 44 given:  Yes Patient signature on Condition Code 44 notice:  Yes Documentation of 2 MD's agreement:  Yes Code 44 added to claim:  Yes    Lawerance Sabal, RN 07/17/2023, 4:40 PM

## 2023-07-17 NOTE — Discharge Summary (Signed)
Physician Discharge Summary  [name] MRN: [id number] DOB/AGE: 01/10/1946 77 y.o.  ADMIT DATE: 07/16/2023 DISCHARGE DATE: 07/17/2023  PRIMARY DISCHARGE DIAGNOSIS: Unstable angina  SECONDARY DISCHARGE DIAGNOSIS: Ischemic cardiomyopathy. Established coronary artery disease with prior surgical revascularization and PCI with stable angina Chronic HFrEF Left bundle branch block Diabetes mellitus type 2. Hypertension. Hyperlipidemia  PROCEDURES /TESTING PERFORMED: Left heart catheterization. Echocardiogram  CONSULTANTS: None  DISCHARGE RECOMMENDATIONS: Continue dual antiplatelet therapy aspirin and Brilinta. Given her chronic kidney disease we will hold off on Entresto for now given the recent contrast exposure as well. Continue Lasix to use on as needed basis as discussed before Will check BMP prior to next office visit Focus on up titration of antianginal therapy. Monitor daily weights and blood pressures Reemphasized the importance of secondary prevention with focus on improving her modifiable cardiovascular risk factors such as glycemic control, lipid management, blood pressure control, weight loss.  CARDIAC DATABASE: EKG: 07/16/2023: Normal sinus rhythm, inferior 2 mm ST segment depression new compared to prior EKG on 09/06/2022, LVH with repolarization abnormality that is unchanged, atypical LBBB.   Echocardiogram: 07/17/2023 1. Left ventricular ejection fraction, by estimation, is 30 to 35%. The left ventricle has moderately decreased function. The left ventricle demonstrates global hypokinesis. The left ventricular internal cavity size was dilated. There is mild left ventricular hypertrophy. Left ventricular diastolic parameters are consistent with Grade I diastolic dysfunction (impaired relaxation). The anterior wall, anterior septum, inferior wall, mid inferoseptal segment, and basal inferoseptal segment are hypokinetic. 2. Right ventricular systolic  function reduced (RV S' 8.9cm/s TAPSE 11mm). The right ventricular size is normal. Tricuspid regurgitation signal is inadequate for assessing PA pressure. 3. The mitral valve is grossly normal. Trivial mitral valve regurgitation. No evidence of mitral stenosis. 4. The aortic valve is tricuspid. Aortic valve regurgitation is not visualized. No aortic stenosis is present. 5. The inferior vena cava is normal in size with greater than 50% respiratory variability, suggesting right atrial pressure of 3 mmHg.  Comparison(s): Prior study 08/27/2022: LVEF 25-30% with RWMA, LV size dilated, Grade I diastolic dyfunction, RV function mildly reduced, RV size normal, small pericardial effusion, mild MR, estimated RAP . Stress Testing: PCV MYOCARDIAL PERFUSION WITH LEXISCAN 11/24/2020  Narrative Lexiscan Tetrofosmin stress test 11/24/2020: Lexiscan nuclear stress test performed using 1-day protocol. Rest and stress EKG showed sinus rhythm, IVCD/tachycardia. Normal myocardial perfusion. Stress LVEF 25%. High risk study due to low stress LVEF.   Heart Catheterization: 08/21/2021 LV: 124/7, EDP 13 mmHg.  Ao 122/70, mean 92 mmHg.  No pressure gradient across the aortic valve. LM: Diffusely diseased with occluded proximal LAD and proximal circumflex with minor branches evident. RCA: Occluded in the midsegment and severely diseased proximally. SVG to RCA: Proximal to mid segment has a ulcerated 80 to 90% stenosis.  Native vessel itself in the PL branch has a diffuse 90% stenosis and PDA has 60 to 70% stenosis.  This is unchanged from prior cardiac catheterization. Successful direct stenting with use of a spider filter wire for distal protection and implantation of a 4.0 x 16 mm Synergy XD at 16 atmospheric pressure, 60 seconds, stenosis reduced to 0%.  TIMI-3 to TIMI-3 flow. SVG to D1: Occluded. Free radial graft to OM1: Widely patent. LIMA to LAD: Widely patent.  There is mild to moderate disease in the  native LAD which appears to have improved from prior cardiac catheterization.   Left Heart Catheterization 07/16/23:  Hemodynamic data:  LV 153/15, EDP 24 mmHg.  Ao 140/68, mean  100 mmHg.  No pressure gradient across the aortic valve.   Angiographic data:  Native arteries were not visualized as RCA and LAD and Cx are known occluded vessels.  LIMA to LAD not visualized as it is presumed to be patent. SVG to RCA: Widely patent 4.0 x 16 mm Synergy XD stent in the proximal segment placed on 08/21/2021, a moderate-sized PL branch previously 80 to 90% now is 99% stenosed with ulceration, culprit lesion. SVG to OM1: Widely patent.   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  However patient's creatinine had risen up to 4 mg, recently [name] has been discontinued and stat serum creatinine was around 2.3-2.4, as the vessel is only small, do not think this is: Make a major impact with regard to mortality benefit.  Patient also extremely concerned and initially did not want to proceed with angiography as well.  I have aborted any angioplasty as the vessel is diffusely diseased.  Will treat him medically with anticoagulation with heparin, patient is presently on Brilinta and aspirin, continue the same for now.  We could consider changing from Brilinta to Xarelto 2.5 mg twice daily along with aspirin 81 mg daily to see whether his symptoms would improve empirically. 7 mL contrast utilized.   HOSPITAL COURSE:   77 y.o. Caucasian male  whose past medical history and cardiovascular risk factors include CAD SP CABG in 2012, LBBB, hypertension, IDDM, stage IV chronic kidney disease, hypercholesterolemia past cardiac catheterization in October 2022 with PCI to SVG to RCA.   Presented to the hospital on 07/16/2023 with symptoms of chest pain not resolved with 3 sublingual nitroglycerin tablets and presentation concerning for  unstable angina.  Given his chronic kidney disease stage IV and symptoms of unstable angina patient had a long discussion with interventional cardiology with regards to proceeding with heart catheterization and his risk for needing dialysis thereafter.  Patient did undergo left heart catheterization after long discussion and weighing the risks, benefits, and alternatives.  He was noted to have disease in the RCA/PL branch.  This was thought to be the culprit for both his chronic angina and unstable presentation.  Intervention was not performed and patient was admitted to cardiac ICU for further monitoring.  Overnight he remains free angina pectoris despite discontinuation of IV heparin drip (this morning).  Telemetry has not noted dysrhythmias.  The patient ambulated without anginal chest pain or arrhythmia.  I spoke to interventional cardiologist Dr. Jacinto Halim given his presentation and coronary anatomy.  The options available to the patient are to continue up titration of antianginal therapy as hemodynamics allow and if stable with medical therapy continue conservative approach.  However if he has anginal discomfort that is progressive despite antianginal therapy then intervention to the PLB branch could be considered.  The options were discussed with both patient and wife at bedside.  They prefer to continue with conservative management in light of his underlying chronic kidney disease.  But understands that if symptoms were to progress elective intervention to the PL branch could be considered.  Of note his troponin peaked at 104 during his hospitalization.  Serum creatinine on arrival 2.76 mg/dL and serum creatinine this morning 2.65 mg/dL.  Will need to hold ACE inhibitor/ARB/ARNI/MRA's due to concerns for possible worsening renal function as he his still not outside the window of CIN.   Did the patient have an acute coronary syndrome (MI, NSTEMI, STEMI, etc) this admission?: Yes  AHA/ACC Clinical Performance & Quality Measures: Aspirin prescribed? - Yes ADP Receptor Inhibitor (Plavix/Clopidogrel, Brilinta/Ticagrelor or Effient/Prasugrel) prescribed (includes medically managed patients)? - Yes Beta Blocker prescribed? - Yes High Intensity Statin (Lipitor 40-80mg  or Crestor 20-40mg ) prescribed? - Yes EF assessed during THIS hospitalization? - Yes For EF <40%, was ACEI/ARB prescribed? - No - Reason:  CKD at risk for progression and CIN. Re-evaulate as outpt. For EF <40%, Aldosterone Antagonist (Spironolactone or Eplerenone) prescribed? - No - Reason:  CKD at risk for progression and CIN. Re-evaulate as outpt. Cardiac Rehab Phase II ordered (including medically managed patients)? - Yes   Discharge Exam: Temp:  [97.6 F (36.4 C)-98.2 F (36.8 C)] 98.2 F (36.8 C) (09/01 1424) Pulse Rate:  [63-84] 80 (09/01 1424) Resp:  [8-26] 17 (09/01 1424) BP: (112-155)/(59-106) 136/63 (09/01 1424) SpO2:  [95 %-100 %] 99 % (09/01 1300) Weight:  [77.6 kg] 77.6 kg (09/01 1424)  Today's Vitals   07/17/23 1200 07/17/23 1300 07/17/23 1423 07/17/23 1424  BP: 137/64 136/63 136/63 136/63  Pulse: 81 80 80 80  Resp: 17  17 17   Temp:   98.2 F (36.8 C) 98.2 F (36.8 C)  TempSrc:   Oral Oral  SpO2: 99% 99%    Weight:    77.6 kg  Height:    6\' 3"  (1.905 m)  PainSc: 0-No pain      Body mass index is 21.37 kg/m.  Net IO Since Admission: -962.09 mL [07/17/23 1554]  Physical Exam  Constitutional: No distress.  Age appropriate, hemodynamically stable.   Neck: No JVD present.  Cardiovascular: Normal rate, regular rhythm, S1 normal, S2 normal, intact distal pulses and normal pulses. Exam reveals no gallop, no S3 and no S4.  No murmur heard. Pulses:      Dorsalis pedis pulses are 2+ on the right side and 2+ on the left side.       Posterior tibial pulses are 2+ on the right side and 2+ on the left side.  Pulmonary/Chest: Effort normal and breath sounds normal. No stridor.  He has no wheezes. He has no rales.  Abdominal: Soft. Bowel sounds are normal. He exhibits no distension. There is no abdominal tenderness.  Musculoskeletal:        General: No edema.     Cervical back: Neck supple.  Neurological: He is alert and oriented to person, place, and time. He has intact cranial nerves (2-12).  Skin: Skin is warm and moist.   LABS:   Lab Results  Component Value Date   WBC 5.1 07/17/2023   HGB 12.5 (L) 07/17/2023   HCT 39.5 07/17/2023   MCV 100.0 07/17/2023   PLT 162 07/17/2023    Recent Labs  Lab 07/16/23 1619 07/17/23 0225  NA 135 137  K 4.2 4.2  CL 110 109  CO2 19* 19*  BUN 31* 31*  CREATININE 2.76* 2.65*  CALCIUM 8.6* 8.7*  PROT 5.8*  --   BILITOT 0.6  --   ALKPHOS 42  --   ALT 22  --   AST 23  --   GLUCOSE 172* 154*    Lipid Panel  Lab Results  Component Value Date   CHOL 109 08/29/2022   HDL 29 (L) 08/29/2022   LDLCALC 37 08/29/2022   LDLDIRECT 42 07/17/2023   TRIG 214 (H) 08/29/2022   CHOLHDL 3.8 08/29/2022    BNP (last 3 results) Recent Labs    08/27/22 1656 08/29/22 0253  BNP 972.2* 1,350.9*    HEMOGLOBIN A1C  Lab Results  Component Value Date   HGBA1C 7.6 (H) 07/16/2023   MPG 171.42 07/16/2023    Cardiac Panel (last 3 results) Recent Labs    07/16/23 1619 07/16/23 1851  TROPONINIHS 19* 104*    TSH Recent Labs    08/27/22 0819  TSH 2.142    Radiology: ECHOCARDIOGRAM COMPLETE  Result Date: 07/17/2023    ECHOCARDIOGRAM REPORT   Patient Name:   Jesus Young Date of Exam: 07/17/2023 Medical Rec #:  [id number]          Height:       75.0 in Accession #:    [id number]         Weight:       171.0 lb Date of Birth:  July 08, 1946          BSA:          2.053 m Patient Age:    77 years           BP:           137/64 mmHg Patient Gender: M                  HR:           88 bpm. Exam Location:  Inpatient Procedure: 2D Echo, Color Doppler, Cardiac Doppler and Intracardiac            Opacification Agent Indications:     CAD Native Vessel I25.10                 Unstable Angina  History:        Patient has prior history of Echocardiogram examinations.                 Cardiomyopathy, CAD, Signs/Symptoms:Chest Pain; Risk                 Factors:Hypertension, Diabetes and Dyslipidemia.  Sonographer:    Celesta Gentile RCS Referring Phys: [id number] [name] IMPRESSIONS  1. Left ventricular ejection fraction, by estimation, is 30 to 35%. The left ventricle has moderately decreased function. The left ventricle demonstrates global hypokinesis. The left ventricular internal cavity size was dilated. There is mild left ventricular hypertrophy. Left ventricular diastolic parameters are consistent with Grade I diastolic dysfunction (impaired relaxation). The anterior wall, anterior septum, inferior wall, mid inferoseptal segment, and basal inferoseptal segment are hypokinetic.  2. Right ventricular systolic function reduced (RV S' 8.9cm/s TAPSE 11mm). The right ventricular size is normal. Tricuspid regurgitation signal is inadequate for assessing PA pressure.  3. The mitral valve is grossly normal. Trivial mitral valve regurgitation. No evidence of mitral stenosis.  4. The aortic valve is tricuspid. Aortic valve regurgitation is not visualized. No aortic stenosis is present.  5. The inferior vena cava is normal in size with greater than 50% respiratory variability, suggesting right atrial pressure of 3 mmHg. Comparison(s): Prior study 08/27/2022: LVEF 25-30% with RWMA, LV size dilated, Grade I diastolic dyfunction, RV function mildly reduced, RV size normal, small pericardial effusion, mild MR, estimated RAP . FINDINGS  Left Ventricle: Left ventricular ejection fraction, by estimation, is 30 to 35%. The left ventricle has moderately decreased function. The left ventricle demonstrates global hypokinesis. Definity contrast agent was given IV to delineate the left ventricular endocardial borders. The left ventricular internal cavity size was  dilated. There is mild left ventricular hypertrophy. Abnormal (paradoxical) septal motion consistent with post-operative status. Left ventricular diastolic parameters are consistent with Grade I diastolic dysfunction (impaired relaxation).  LV  Wall Scoring: The anterior wall, anterior septum, inferior wall, mid inferoseptal segment, and basal inferoseptal segment are hypokinetic. The anterior wall, anterior septum, inferior wall, mid inferoseptal segment, and basal inferoseptal segment are hypokinetic. Right Ventricle: The right ventricular size is normal. No increase in right ventricular wall thickness. Right ventricular systolic function reduced (RV S' 8.9cm/s TAPSE 11mm). Tricuspid regurgitation signal is inadequate for assessing PA pressure. Left Atrium: Left atrial size was normal in size. Right Atrium: Right atrial size was normal in size. Pericardium: There is no evidence of pericardial effusion. Mitral Valve: The mitral valve is grossly normal. Trivial mitral valve regurgitation. No evidence of mitral valve stenosis. Tricuspid Valve: The tricuspid valve is grossly normal. Tricuspid valve regurgitation is not demonstrated. No evidence of tricuspid stenosis. Aortic Valve: The aortic valve is tricuspid. Aortic valve regurgitation is not visualized. No aortic stenosis is present. Pulmonic Valve: The pulmonic valve was not well visualized. Pulmonic valve regurgitation is not visualized. No evidence of pulmonic stenosis. Aorta: The aortic root is normal in size and structure and the ascending aorta was not well visualized. Venous: The inferior vena cava is normal in size with greater than 50% respiratory variability, suggesting right atrial pressure of 3 mmHg. IAS/Shunts: The atrial septum is grossly normal.  LEFT VENTRICLE PLAX 2D LVIDd:         5.80 cm      Diastology LVIDs:         4.90 cm      LV e' medial:    3.15 cm/s LV PW:         1.30 cm      LV E/e' medial:  15.4 LV IVS:        1.00 cm      LV e'  lateral:   7.72 cm/s LVOT diam:     2.10 cm      LV E/e' lateral: 6.3 LV SV:         50 LV SV Index:   24 LVOT Area:     3.46 cm  LV Volumes (MOD) LV vol d, MOD A2C: 160.0 ml LV vol d, MOD A4C: 140.0 ml LV vol s, MOD A2C: 101.0 ml LV vol s, MOD A4C: 94.2 ml LV SV MOD A2C:     59.0 ml LV SV MOD A4C:     140.0 ml LV SV MOD BP:      52.3 ml RIGHT VENTRICLE RV S prime:     8.92 cm/s TAPSE (M-mode): 1.1 cm LEFT ATRIUM             Index        RIGHT ATRIUM           Index LA diam:        3.50 cm 1.70 cm/m   RA Area:     13.10 cm LA Vol (A2C):   56.5 ml 27.52 ml/m  RA Volume:   28.30 ml  13.78 ml/m LA Vol (A4C):   55.5 ml 27.03 ml/m LA Biplane Vol: 58.2 ml 28.35 ml/m  AORTIC VALVE LVOT Vmax:   73.20 cm/s LVOT Vmean:  46.700 cm/s LVOT VTI:    0.144 m  AORTA Ao Root diam: 3.60 cm MITRAL VALVE MV Area (PHT): 3.53 cm     SHUNTS MV Decel Time: 215 msec     Systemic VTI:  0.14 m MV E velocity: 48.50 cm/s   Systemic Diam: 2.10 cm MV A velocity: 107.00 cm/s MV E/A ratio:  0.45 Apryle Stowell DO Electronically  signed by [name] DO Signature Date/Time: 07/17/2023/3:43:22 PM    Final    CARDIAC CATHETERIZATION  Result Date: 07/16/2023 Images from the original result were not included. Left Heart Catheterization 07/16/23: Hemodynamic data: LV 153/15, EDP 24 mmHg.  Ao 140/68, mean 100 mmHg.  No pressure gradient across the aortic valve. Angiographic data: Native arteries were not visualized as RCA and LAD and Cx are known occluded vessels.  LIMA to LAD not visualized as it is presumed to be patent. SVG to RCA: Widely patent 4.0 x 16 mm Synergy XD stent in the proximal segment placed on 08/21/2021, a moderate-sized PL branch previously 80 to 90% now is 99% stenosed with ulceration, culprit lesion. SVG to OM1: Widely patent. 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  However patient's creatinine had risen up to 4 mg, recently [name] has been  discontinued and stat serum creatinine was around 2.3-2.4, as the vessel is only small, do not think this is: Make a major impact with regard to mortality benefit.  Patient also extremely concerned and initially did not want to proceed with angiography as well.  I have aborted any angioplasty as the vessel is diffusely diseased.  Will treat him medically with anticoagulation with heparin, patient is presently on Brilinta and aspirin, continue the same for now.  We could consider changing from Brilinta to Xarelto 2.5 mg twice daily along with aspirin 81 mg daily to see whether his symptoms would improve empirically. 7 mL contrast utilized.      FOLLOW UP PLANS AND APPOINTMENTS Discharge Instructions     AMB referral to Phase II Cardiac Rehabilitation   Complete by: As directed    Diagnosis: Other   After initial evaluation and assessments completed: Virtual Based Care may be provided alone or in conjunction with Phase 2 Cardiac Rehab based on patient barriers.: Yes   Intensive Cardiac Rehabilitation (ICR) [facility] location only OR Traditional Cardiac Rehabilitation (TCR) *If criteria for ICR are not met will enroll in TCR [facility] only): Yes      Allergies as of 07/17/2023       Reactions   Tetanus Antitoxin Other (See Comments)   Clonazepam Other (See Comments)   Headache   Lisinopril Other (See Comments)   headache        Medication List     STOP taking these medications    acetaminophen 650 MG CR tablet Commonly known as: TYLENOL   Entresto 24-26 MG Generic drug: sacubitril-valsartan       TAKE these medications    aspirin EC 81 MG tablet Take 81 mg by mouth at bedtime.   B-D ULTRAFINE III SHORT PEN 31G X 8 MM Misc Generic drug: Insulin Pen Needle USE UTD   Brilinta 90 MG Tabs tablet Generic drug: ticagrelor TAKE 1 TABLET(90 MG) BY MOUTH TWICE DAILY   busPIRone 10 MG tablet Commonly known as: BUSPAR Take 20 mg by mouth 2 (two) times daily.   Calcium Citrate-Vitamin D  315-5 MG-MCG Tabs Take 1 tablet by mouth daily at 12 noon.   Centrum Silver tablet Take 1 tablet by mouth daily.   famotidine 20 MG tablet Commonly known as: PEPCID TAKE 1 TABLET(20 MG) BY MOUTH AT BEDTIME   furosemide 40 MG tablet Commonly known as: LASIX Take 1 tablet (40 mg total) by mouth daily as needed for fluid (If weight goes up by 1 pound over 24 hours or 3 pounds over a  week). TAKE 1 TABLET(40 MG) BY MOUTH DAILY   GENTLE STOOL SOFTENER PO Take 600 mg by mouth daily.   HumuLIN N KwikPen 100 UNIT/ML KwikPen Generic drug: Insulin NPH (Human) (Isophane) Inject into the skin.   insulin lispro 100 UNIT/ML injection Commonly known as: HUMALOG Inject 0.05-0.09 mLs (5-9 Units total) into the skin 2 (two) times daily. 5 units at lunch, and 9 units after dinner. What changed:  how much to take additional instructions   isosorbide mononitrate 30 MG 24 hr tablet Commonly known as: IMDUR Take 1 tablet (30 mg total) by mouth daily.   Jardiance 10 MG Tabs tablet Generic drug: empagliflozin TAKE 1 TABLET(10 MG) BY MOUTH DAILY BEFORE BREAKFAST   levothyroxine 88 MCG tablet Commonly known as: SYNTHROID Take 88 mcg by mouth daily.   meclizine 25 MG tablet Commonly known as: ANTIVERT Take 25 mg by mouth 3 (three) times daily as needed (for vertigo).   metoprolol succinate 50 MG 24 hr tablet Commonly known as: TOPROL-XL TAKE 1 TABLET(50 MG) BY MOUTH DAILY WITH OR IMMEDIATELY FOLLOWING A MEAL   nitroGLYCERIN 0.4 MG SL tablet Commonly known as: NITROSTAT Place 1 tablet (0.4 mg total) under the tongue every 5 (five) minutes as needed for chest pain. What changed: Another medication with the same name was removed. Continue taking this medication, and follow the directions you see here.   ONE TOUCH ULTRA TEST test strip Generic drug: glucose blood   pantoprazole 40 MG tablet Commonly known as: PROTONIX Take 40 mg by mouth at bedtime.   ranolazine 500 MG 12 hr  tablet Commonly known as: RANEXA Take 1 tablet (500 mg total) by mouth daily. What changed: See the new instructions.   rosuvastatin 40 MG tablet Commonly known as: CRESTOR Take 40 mg by mouth at bedtime.        Follow-up Information     Aaminah Forrester, DO. Call in 3 day(s).   Specialties: Cardiology, Vascular Surgery Why: Would like to see back in the office in 1 week postdischarge. Please bring medication bottles for reconciliation. Will arrange a BMP prior to the office visit to recheck renal function. Please follow-up with nephrology as well as outpatient Contact information: [address] [place] [address] [phone]         Georgianne Fick, MD. Schedule an appointment as soon as possible for a visit in 1 week(s).   Specialty: Internal Medicine Contact information: [address] [place] [address] [phone]                 Total time spent on patient's discharge was 35 minutes.   [name], [place], [facility]  Pager:  [phone] Office: [phone]

## 2023-07-17 NOTE — Plan of Care (Signed)
  Problem: Education: Goal: Knowledge of General Education information will improve Description: Including pain rating scale, medication(s)/side effects and non-pharmacologic comfort measures 07/17/2023 1630 by Darlina Sicilian, RN Outcome: Adequate for Discharge 07/17/2023 1621 by Darlina Sicilian, RN Outcome: Adequate for Discharge   Problem: Health Behavior/Discharge Planning: Goal: Ability to manage health-related needs will improve 07/17/2023 1630 by Darlina Sicilian, RN Outcome: Adequate for Discharge 07/17/2023 1621 by Darlina Sicilian, RN Outcome: Adequate for Discharge   Problem: Clinical Measurements: Goal: Ability to maintain clinical measurements within normal limits will improve 07/17/2023 1630 by Darlina Sicilian, RN Outcome: Adequate for Discharge 07/17/2023 1621 by Darlina Sicilian, RN Outcome: Adequate for Discharge Goal: Diagnostic test results will improve Outcome: Adequate for Discharge Goal: Respiratory complications will improve Outcome: Adequate for Discharge Goal: Cardiovascular complication will be avoided Outcome: Adequate for Discharge   Problem: Activity: Goal: Risk for activity intolerance will decrease Outcome: Adequate for Discharge   Problem: Nutrition: Goal: Adequate nutrition will be maintained Outcome: Adequate for Discharge   Problem: Coping: Goal: Level of anxiety will decrease Outcome: Adequate for Discharge   Problem: Elimination: Goal: Will not experience complications related to bowel motility Outcome: Adequate for Discharge Goal: Will not experience complications related to urinary retention Outcome: Adequate for Discharge   Problem: Pain Managment: Goal: General experience of comfort will improve Outcome: Adequate for Discharge   Problem: Safety: Goal: Ability to remain free from injury will improve Outcome: Adequate for Discharge   Problem: Skin Integrity: Goal: Risk for impaired skin integrity will decrease Outcome:  Adequate for Discharge   Problem: Education: Goal: Ability to describe self-care measures that may prevent or decrease complications (Diabetes Survival Skills Education) will improve Outcome: Adequate for Discharge Goal: Individualized Educational Video(s) Outcome: Adequate for Discharge   Problem: Coping: Goal: Ability to adjust to condition or change in health will improve Outcome: Adequate for Discharge   Problem: Fluid Volume: Goal: Ability to maintain a balanced intake and output will improve Outcome: Adequate for Discharge   Problem: Health Behavior/Discharge Planning: Goal: Ability to identify and utilize available resources and services will improve Outcome: Adequate for Discharge Goal: Ability to manage health-related needs will improve Outcome: Adequate for Discharge   Problem: Metabolic: Goal: Ability to maintain appropriate glucose levels will improve Outcome: Adequate for Discharge   Problem: Nutritional: Goal: Maintenance of adequate nutrition will improve Outcome: Adequate for Discharge Goal: Progress toward achieving an optimal weight will improve Outcome: Adequate for Discharge   Problem: Skin Integrity: Goal: Risk for impaired skin integrity will decrease Outcome: Adequate for Discharge   Problem: Tissue Perfusion: Goal: Adequacy of tissue perfusion will improve Outcome: Adequate for Discharge   Problem: Education: Goal: Understanding of CV disease, CV risk reduction, and recovery process will improve Outcome: Adequate for Discharge Goal: Individualized Educational Video(s) Outcome: Adequate for Discharge   Problem: Activity: Goal: Ability to return to baseline activity level will improve Outcome: Adequate for Discharge   Problem: Cardiovascular: Goal: Ability to achieve and maintain adequate cardiovascular perfusion will improve Outcome: Adequate for Discharge Goal: Vascular access site(s) Level 0-1 will be maintained Outcome: Adequate for  Discharge   Problem: Health Behavior/Discharge Planning: Goal: Ability to safely manage health-related needs after discharge will improve Outcome: Adequate for Discharge

## 2023-07-17 NOTE — Progress Notes (Signed)
Patient provided AVS and discharge instructions including medications, home care of sites and when to call 911. Patient verbalizes understanding of instructions and all questions answered until no further questions. Final set of vitals obtained before removing patient from monitor. Patient changed into own clothing and transported to personal vehicle via wheelchair with no incidence.   Temp 97.7 oral  138/69 (86) HR:73 Sp02 99% on room air  Respiratory Rate: 17 Pain was a 0 on a 0-10 scale, No pain.  Darlina Sicilian, RN 07/17/23 5:41 PM

## 2023-07-17 NOTE — Care Management Obs Status (Signed)
MEDICARE OBSERVATION STATUS NOTIFICATION   Patient Details  Name: Jesus Young MRN: [id number] Date of Birth: 1946/10/19   Medicare Observation Status Notification Given:  Yes    Lawerance Sabal, RN 07/17/2023, 4:40 PM

## 2023-07-19 ENCOUNTER — Encounter (HOSPITAL_COMMUNITY): Payer: Self-pay | Admitting: Cardiology

## 2023-07-20 ENCOUNTER — Other Ambulatory Visit: Payer: Self-pay

## 2023-07-20 DIAGNOSIS — I25118 Atherosclerotic heart disease of native coronary artery with other forms of angina pectoris: Secondary | ICD-10-CM

## 2023-07-20 DIAGNOSIS — I255 Ischemic cardiomyopathy: Secondary | ICD-10-CM | POA: Diagnosis not present

## 2023-07-20 DIAGNOSIS — E782 Mixed hyperlipidemia: Secondary | ICD-10-CM

## 2023-07-20 DIAGNOSIS — Z955 Presence of coronary angioplasty implant and graft: Secondary | ICD-10-CM | POA: Diagnosis not present

## 2023-07-20 DIAGNOSIS — I5022 Chronic systolic (congestive) heart failure: Secondary | ICD-10-CM

## 2023-07-20 LAB — LIPOPROTEIN A (LPA): Lipoprotein (a): <8.4 nmol/L (ref <75.0)

## 2023-07-20 NOTE — Progress Notes (Signed)
Called patient, he confirmed appointment with me.

## 2023-07-21 DIAGNOSIS — Z23 Encounter for immunization: Secondary | ICD-10-CM | POA: Diagnosis not present

## 2023-07-21 LAB — BASIC METABOLIC PANEL
BUN/Creatinine Ratio: 12 (ref 10–24)
BUN: 32 mg/dL — ABNORMAL HIGH (ref 8–27)
CO2: 18 mmol/L — ABNORMAL LOW (ref 20–29)
Calcium: 9.7 mg/dL (ref 8.6–10.2)
Chloride: 104 mmol/L (ref 96–106)
Creatinine, Ser: 2.58 mg/dL — ABNORMAL HIGH (ref 0.76–1.27)
Glucose: 162 mg/dL — ABNORMAL HIGH (ref 70–99)
Potassium: 5.1 mmol/L (ref 3.5–5.2)
Sodium: 137 mmol/L (ref 134–144)
eGFR: 25 mL/min/{1.73_m2} — ABNORMAL LOW (ref >59)

## 2023-07-21 LAB — LIPID PANEL WITH LDL/HDL RATIO
Cholesterol, Total: 116 mg/dL (ref 100–199)
HDL: 37 mg/dL — ABNORMAL LOW (ref >39)
LDL Chol Calc (NIH): 52 mg/dL (ref 0–99)
LDL/HDL Ratio: 1.4 ratio (ref 0.0–3.6)
Triglycerides: 155 mg/dL — ABNORMAL HIGH (ref 0–149)
VLDL Cholesterol Cal: 27 mg/dL (ref 5–40)

## 2023-07-21 LAB — LDL CHOLESTEROL, DIRECT: LDL Direct: 48 mg/dL (ref 0–99)

## 2023-07-21 LAB — MAGNESIUM: Magnesium: 2.1 mg/dL (ref 1.6–2.3)

## 2023-07-25 ENCOUNTER — Encounter: Payer: Self-pay | Admitting: Cardiology

## 2023-07-25 ENCOUNTER — Ambulatory Visit: Payer: Medicare Other | Admitting: Cardiology

## 2023-07-25 VITALS — BP 140/63 | HR 71 | Resp 16 | Ht 75.0 in | Wt 171.8 lb

## 2023-07-25 DIAGNOSIS — M25512 Pain in left shoulder: Secondary | ICD-10-CM | POA: Diagnosis not present

## 2023-07-25 DIAGNOSIS — I255 Ischemic cardiomyopathy: Secondary | ICD-10-CM

## 2023-07-25 DIAGNOSIS — I25118 Atherosclerotic heart disease of native coronary artery with other forms of angina pectoris: Secondary | ICD-10-CM

## 2023-07-25 DIAGNOSIS — I447 Left bundle-branch block, unspecified: Secondary | ICD-10-CM | POA: Diagnosis not present

## 2023-07-25 DIAGNOSIS — E782 Mixed hyperlipidemia: Secondary | ICD-10-CM

## 2023-07-25 DIAGNOSIS — I5022 Chronic systolic (congestive) heart failure: Secondary | ICD-10-CM | POA: Diagnosis not present

## 2023-07-25 DIAGNOSIS — Z955 Presence of coronary angioplasty implant and graft: Secondary | ICD-10-CM

## 2023-07-25 DIAGNOSIS — I10 Essential (primary) hypertension: Secondary | ICD-10-CM | POA: Diagnosis not present

## 2023-07-25 DIAGNOSIS — M7582 Other shoulder lesions, left shoulder: Secondary | ICD-10-CM | POA: Diagnosis not present

## 2023-07-25 DIAGNOSIS — Z951 Presence of aortocoronary bypass graft: Secondary | ICD-10-CM | POA: Diagnosis not present

## 2023-07-25 MED ORDER — VASCEPA 1 G PO CAPS
2.0000 g | ORAL_CAPSULE | Freq: Two times a day (BID) | ORAL | 0 refills | Status: DC
Start: 2023-07-25 — End: 2023-10-26

## 2023-07-25 MED ORDER — ENTRESTO 24-26 MG PO TABS: 1.0000 | ORAL_TABLET | Freq: Two times a day (BID) | ORAL | Status: AC

## 2023-07-25 NOTE — Progress Notes (Signed)
ID:  Jesus Young, DOB 06/16/1946, MRN [id number]  PCP:  Jesus Fick, MD  Cardiologist:  Jesus Lerner, DO, [facility] (established care 08/26/2022) and Dr. Lupita Young at [facility] Former Cardiology Providers: Jesus Young, [place] Nephrologist: Dr. Arrie Young  Date: 07/25/23 Last Office Visit: 06/17/2023  Chief Complaint  Patient presents with   Hospitalization Follow-up    Recent unstable angina status post heart catheterization    HPI  Jesus Young is a 77 y.o. Caucasian male whose past medical history and cardiovascular risk factors include: CAD status post CABG in 2012, LBBB, HTN, IDDM, hyperlipidemia, chronic kidney disease, polycythemia, history of COVID-19 infection, advanced age.  Known history of CAD status post CABG in 2012.  His initial NSTEMI was in October 2022 when he underwent PCI to the SVG to RCA graft.  He was treated medically until his second NSTEMI in October 2023.  Echocardiogram illustrated newly reduced LVEF and we discussed early invasive angiography versus medical management but given his progressive chronic kidney disease patient preferred medical management as contrast exposure could hasten worsening CKD leading to end-stage renal disease.  Despite up titration of medical therapy he had anginal symptoms and therefore referred to [facility] for evaluation of EECP.  He established care with Dr. Lupita Young and has been under his care.  He too has been focusing on medical therapy given his renal function.  Shared decision was to continue seeing Dr. Lupita Young and to follow-up with me once every 6 months as he resides in [place].  At baseline he has stable angina for which she required sublingual nitroglycerin tablets.  At times he questions if the discomfort is psychosomatic in nature.  Since last office visit he went to the ED for evaluation of chest pain and his presentation was concerning for unstable angina.  Given his renal function patient understood that he is at  high risk of developing acute kidney injury needing hemodialysis.  He consented to proceed forward with left heart catheterization.  His prior stent in the SVG to RCA is patent with disease in the PLB branch which is likely the culprit.  His SVG to OM1 is widely patent.  7 cc of contrast were used and he did not develop acute kidney injury.  He presents today for follow-up.  Patient's renal function remained stable (actually improved compared to baseline).  He remains to have stable angina without any acute exacerbation of chest pain/angina.  Patient prefers to go back on Entresto as it really helps with symptoms of congestion and blood pressures.  Patient states that his strength is better since hospital discharge.  ALLERGIES: Allergies  Allergen Reactions   Tetanus Antitoxin Other (See Comments)   Clonazepam Other (See Comments)    Headache   Lisinopril Other (See Comments)    headache    MEDICATION LIST PRIOR TO VISIT: Current Meds  Medication Sig   acetaminophen (TYLENOL) 650 MG CR tablet Take 650 mg by mouth every 8 (eight) hours as needed for pain.   aspirin EC 81 MG tablet Take 81 mg by mouth at bedtime.    B-D ULTRAFINE III SHORT PEN 31G X 8 MM MISC USE UTD   BRILINTA 90 MG TABS tablet TAKE 1 TABLET(90 MG) BY MOUTH TWICE DAILY   busPIRone (BUSPAR) 10 MG tablet Take 20 mg by mouth 2 (two) times daily.   Calcium Citrate-Vitamin D 315-5 MG-MCG TABS Take 1 tablet by mouth daily at 12 noon.   Docusate Sodium (GENTLE STOOL SOFTENER PO) Take 600 mg by  mouth daily.   famotidine (PEPCID) 20 MG tablet TAKE 1 TABLET(20 MG) BY MOUTH AT BEDTIME   HUMULIN N KWIKPEN 100 UNIT/ML KwikPen Inject into the skin.   insulin lispro (HUMALOG) 100 UNIT/ML injection Inject 0.05-0.09 mLs (5-9 Units total) into the skin 2 (two) times daily. 5 units at lunch, and 9 units after dinner. (Patient taking differently: Inject 5-15 Units into the skin 2 (two) times daily. 5-15 units as needed  (sliding scale insulin))    isosorbide mononitrate (IMDUR) 30 MG 24 hr tablet Take 1 tablet (30 mg total) by mouth daily.   JARDIANCE 10 MG TABS tablet TAKE 1 TABLET(10 MG) BY MOUTH DAILY BEFORE BREAKFAST   levothyroxine (SYNTHROID) 88 MCG tablet Take 88 mcg by mouth daily.   meclizine (ANTIVERT) 25 MG tablet Take 25 mg by mouth 3 (three) times daily as needed (for vertigo).   metoprolol succinate (TOPROL-XL) 50 MG 24 hr tablet TAKE 1 TABLET(50 MG) BY MOUTH DAILY WITH OR IMMEDIATELY FOLLOWING A MEAL (Patient taking differently: Take 50 mg by mouth in the morning and at bedtime.)   Multiple Vitamins-Minerals (CENTRUM SILVER) tablet Take 1 tablet by mouth daily.   nitroGLYCERIN (NITROSTAT) 0.4 MG SL tablet Place 1 tablet (0.4 mg total) under the tongue every 5 (five) minutes as needed for chest pain.   ONE TOUCH ULTRA TEST test strip    ranolazine (RANEXA) 500 MG 12 hr tablet Take 1 tablet (500 mg total) by mouth daily.   rosuvastatin (CRESTOR) 40 MG tablet Take 40 mg by mouth at bedtime.   sacubitril-valsartan (ENTRESTO) 24-26 MG Take 1 tablet by mouth 2 (two) times daily.   VASCEPA 1 g capsule Take 2 capsules (2 g total) by mouth 2 (two) times daily.     PAST MEDICAL HISTORY: Past Medical History:  Diagnosis Date   Anxiety    Arthritis    back and knees   Bronchitis    hx of 2015   Cancer (HCC)    prostate   Cataract    immature on right   Chronic back pain    CKD (chronic kidney disease)    Constipation    takes Colace at bedtime   Coronary artery disease    takes Plavix daily but is on hold for surgery   Depression    Diabetes mellitus without complication (HCC)    takes Levemir and Humalog daily   Dizziness    Fall    hx of with injury to left shoulder    GERD (gastroesophageal reflux disease)    takes Protonix daily and Zantac   H/O urinary frequency    Hemorrhoids    History of bladder infections    History of kidney stones    Hyperlipidemia    takes Zocor every evening   Hypertension     takes Metoprolol daily   Hypothyroidism    takes Synthroid daily   Left bundle branch block    Myocardial infarction (HCC)    many yrs before 2002   Night muscle spasms    takes Robaxin 4 x day    Nocturia     PAST SURGICAL HISTORY: Past Surgical History:  Procedure Laterality Date   CARDIAC CATHETERIZATION  05/17/02   COLONOSCOPY     CORONARY ANGIOPLASTY     x 1    CORONARY ARTERY BYPASS GRAFT  2002   x 5   CORONARY/GRAFT ACUTE MI REVASCULARIZATION N/A 07/16/2023   Procedure: Coronary/Graft Acute MI Revascularization;  Surgeon: [name], MD;  Location: [facility];  Service: Cardiovascular;  Laterality: N/A;   eyelid surgery      INGUINAL HERNIA REPAIR Left 05/06/2020   Procedure: LEFT INGUINAL HERNIA REPAIR;  Surgeon: Manus Rudd, MD;  Location: [facility] OR;  Service: General;  Laterality: Left;   INSERTION OF MESH Left 05/06/2020   Procedure: INSERTION OF MESH;  Surgeon: Manus Rudd, MD;  Location: [facility] OR;  Service: General;  Laterality: Left;   LEFT HEART CATH AND CORS/GRAFTS ANGIOGRAPHY N/A 12/05/2018   Procedure: LEFT HEART CATH AND CORS/GRAFTS ANGIOGRAPHY;  Surgeon: [name], MD;  Location: [facility];  Service: Cardiovascular;  Laterality: N/A;   LEFT HEART CATH AND CORS/GRAFTS ANGIOGRAPHY N/A 08/21/2021   Procedure: LEFT HEART CATH AND CORS/GRAFTS ANGIOGRAPHY;  Surgeon: [name], MD;  Location: [facility];  Service: Cardiovascular;  Laterality: N/A;   LUMBAR LAMINECTOMY/DECOMPRESSION MICRODISCECTOMY Bilateral 07/18/2013   Procedure: Bilateral Lumbar four-five Lumbar Laminotomy/Microdiskectomy;  Surgeon: Hewitt Shorts, MD;  Location: [facility];  Service: Neurosurgery;  Laterality: Bilateral;  Bilateral Lumbar four-five Lumbar Laminotomy/Microdiskectomy   NEPHROLITHOTOMY Left 06/23/2015   Procedure: NEPHROLITHOTOMY PERCUTANEOUS;  Surgeon: Heloise Purpura, MD;  Location: [facility] OR[facility];  Service: Urology;  Laterality: Left;   PROSTATECTOMY  10/2009   skin spots  removed from back -precancerous     TONSILLECTOMY      FAMILY HISTORY: The patient family history includes Heart attack in his father; Heart disease in his father; Thyroid disease in his sister.  SOCIAL HISTORY:  The patient  reports that he has never smoked. He has never used smokeless tobacco. He reports that he does not drink alcohol and does not use drugs.  REVIEW OF SYSTEMS: Review of Systems  Cardiovascular:  Positive for dyspnea on exertion (chronic and stable). Negative for chest pain, claudication, irregular heartbeat, leg swelling, near-syncope, orthopnea, palpitations, paroxysmal nocturnal dyspnea and syncope.  Respiratory:  Negative for shortness of breath.   Hematologic/Lymphatic: Negative for bleeding problem.  Musculoskeletal:  Negative for muscle cramps and myalgias.  Neurological:  Negative for dizziness and light-headedness.   PHYSICAL EXAM:    07/25/2023    3:03 PM 07/17/2023    5:00 PM 07/17/2023    4:46 PM  Vitals with BMI  Height 6\' 3"     Weight 171 lbs 13 oz    BMI 21.47    Systolic 140  138  Diastolic 63  69  Pulse 71 71 72    Physical Exam  Constitutional: No distress.  Age appropriate, hemodynamically stable.   Neck: No JVD present.  Cardiovascular: Normal rate, regular rhythm, S1 normal, S2 normal, intact distal pulses and normal pulses. Exam reveals no gallop, no S3 and no S4.  No murmur heard. Pulses:      Dorsalis pedis pulses are 2+ on the right side and 2+ on the left side.       Posterior tibial pulses are 2+ on the right side and 2+ on the left side.  Pulmonary/Chest: Effort normal and breath sounds normal. No stridor. He has no wheezes. He has no rales.  Abdominal: Soft. Bowel sounds are normal. He exhibits no distension. There is no abdominal tenderness.  Musculoskeletal:        General: No edema.     Cervical back: Neck supple.  Neurological: He is alert and oriented to person, place, and time. He has intact cranial nerves (2-12).  Skin:  Skin is warm and moist.   CARDIAC DATABASE: EKG: 06/17/2023: Sinus rhythm, 71 bpm, IVCD  consistent with left bundle branch block, left axis, ST-T changes inferolateral leads cannot rule out ischemia.  Echocardiogram: 08/27/2022: LVEF 25-30%, see report for additional details  07/17/2023  1. Left ventricular ejection fraction, by estimation, is 30 to 35%. The  left ventricle has moderately decreased function. The left ventricle  demonstrates global hypokinesis. The left ventricular internal cavity size  was dilated. There is mild left  ventricular hypertrophy. Left ventricular diastolic parameters are  consistent with Grade I diastolic dysfunction (impaired relaxation). The  anterior wall, anterior septum, inferior wall, mid inferoseptal segment,  and basal inferoseptal segment are  hypokinetic.   2. Right ventricular systolic function reduced (RV S' 8.9cm/s TAPSE  11mm). The right ventricular size is normal. Tricuspid regurgitation  signal is inadequate for assessing PA pressure.   3. The mitral valve is grossly normal. Trivial mitral valve  regurgitation. No evidence of mitral stenosis.   4. The aortic valve is tricuspid. Aortic valve regurgitation is not  visualized. No aortic stenosis is present.   5. The inferior vena cava is normal in size with greater than 50%  respiratory variability, suggesting right atrial pressure of 3 mmHg.   Comparison(s): Prior study 08/27/2022: LVEF 25-30% with RWMA, LV size  dilated, Grade I diastolic dyfunction, RV function mildly reduced, RV size  normal, small pericardial effusion, mild MR, estimated RAP .   Stress Testing: Lexiscan Tetrofosmin stress test 11/24/2020: Lexiscan nuclear stress test performed using 1-day protocol. Rest and stress EKG showed sinus rhythm, IVCD/tachycardia. Normal myocardial perfusion. Stress LVEF 25%. High risk study due to low stress LVEF.  Heart Catheterization: Left Heart Catheterization 07/16/23:   Hemodynamic data:  LV 153/15, EDP 24 mmHg.  Ao 140/68, mean 100 mmHg.  No pressure gradient across the aortic valve.   Angiographic data:  Native arteries were not visualized as RCA and LAD and Cx are known occluded vessels.  LIMA to LAD not visualized as it is presumed to be patent. SVG to RCA: Widely patent 4.0 x 16 mm Synergy XD stent in the proximal segment placed on 08/21/2021, a moderate-sized PL branch previously 80 to 90% now is 99% stenosed with ulceration, culprit lesion. SVG to OM1: Widely patent.    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  However patient's creatinine had risen up to 4 mg, recently [name] has been discontinued and stat serum creatinine was around 2.3-2.4, as the vessel is only small, do not think this is: Make a major impact with regard to mortality benefit.  Patient also extremely concerned and initially did not want to proceed with angiography as well.  I have aborted any angioplasty as the vessel is diffusely diseased.  Will treat him medically with anticoagulation with heparin, patient is presently on Brilinta and aspirin, continue the same for now.  We could consider changing from Brilinta to Xarelto 2.5 mg twice daily along with aspirin 81 mg daily to see whether his symptoms would improve empirically. 7 mL contrast utilized.  LABORATORY DATA:    Latest Ref Rng & Units 07/17/2023    2:25 AM 07/16/2023    4:19 PM 12/10/2022    5:00 PM  CBC  WBC 4.0 - 10.5 K/uL 5.1  4.8  5.6   Hemoglobin 13.0 - 17.0 g/dL 16.1  09.6  04.5   Hematocrit 39.0 - 52.0 % 39.5  38.3  44.1   Platelets 150 - 400 K/uL 162  158  161  Latest Ref Rng & Units 07/20/2023    1:06 PM 07/17/2023    2:25 AM 07/16/2023    4:19 PM  CMP  Glucose 70 - 99 mg/dL 010  272  536   BUN 8 - 27 mg/dL 32  31  31   Creatinine 0.76 - 1.27 mg/dL 6.44  0.34  7.42   Sodium 134 - 144 mmol/L 137  137  135   Potassium  3.5 - 5.2 mmol/L 5.1  4.2  4.2   Chloride 96 - 106 mmol/L 104  109  110   CO2 20 - 29 mmol/L 18  19  19    Calcium 8.6 - 10.2 mg/dL 9.7  8.7  8.6   Total Protein 6.5 - 8.1 g/dL   5.8   Total Bilirubin 0.3 - 1.2 mg/dL   0.6   Alkaline Phos 38 - 126 U/L   42   AST 15 - 41 U/L   23   ALT 0 - 44 U/L   22     Lipid Panel     Component Value Date/Time   CHOL 116 07/20/2023 1308   TRIG 155 (H) 07/20/2023 1308   HDL 37 (L) 07/20/2023 1308   CHOLHDL 3.6 07/17/2023 0225   VLDL 35 07/17/2023 0225   LDLCALC 52 07/20/2023 1308   LDLDIRECT 48 07/20/2023 1308   LDLDIRECT 42 07/17/2023 0225   LABVLDL 27 07/20/2023 1308    No components found for: "NTPROBNP" Recent Labs    09/30/22 1101 10/15/22 1426 11/02/22 1324 11/19/22 1435  PROBNP 5,409* 4,725* 9,659* 4,941*   Recent Labs    08/27/22 0819  TSH 2.142    BMP Recent Labs    08/29/22 0229 09/30/22 1101 07/16/23 1619 07/17/23 0225 07/20/23 1306  NA 135   < > 135 137 137  K 3.5   < > 4.2 4.2 5.1  CL 106   < > 110 109 104  CO2 22   < > 19* 19* 18*  GLUCOSE 150*   < > 172* 154* 162*  BUN 28*   < > 31* 31* 32*  CREATININE 2.33*   < > 2.76* 2.65* 2.58*  CALCIUM 8.7*   < > 8.6* 8.7* 9.7  GFRNONAA 28*  --  23* 24*  --    < > = values in this interval not displayed.    HEMOGLOBIN A1C Lab Results  Component Value Date   HGBA1C 7.6 (H) 07/16/2023   MPG 171.42 07/16/2023   External Labs: Collected: 08/26/2022 provided by the patient performed at [facility] AST 24, ALT 33, alkaline phosphatase 46. BUN 40, creatinine 2.79. BUN/creatinine ratio 14.3. Chloride 95  External Labs: Office note dated 09/01/2022 by PCP noted in Care Everywhere  Sodium 137 136-145 mEq/L    Potassium 4.0 3.5-5.1 mEq/L    Chloride 100 98-107 mEq/L    CO2 29 21-32 mmol/L    Glucose 127 74-106 mg/dL    BUN 24 5-95 mg/dL    Creatinine 6.38 7.56-4.33 mg/dL    Calcium 9.4 2.9-51.8 mg/dL    BUN/Creatinine Ratio 10.6 11.0-26.0 Ratio    GFR/Black 31 >59  mL/min/1.1m2    GFR/White 27 >59 mL/min/1.71m2    External Labs: Collected: 12/07/2022. BUN 29, creatinine 1.87. Sodium 142, potassium 4.1, chloride 104, bicarb 23.  11/30/2022 Sodium 135, potassium 3.6, chloride 98, bicarb 20. BUN 44, creatinine 2.2 EGFR 30  External Labs: Collected: Mar 17, 2023 available in Care Everywhere. Sodium 142, potassium 5, chloride 108, bicarb 19. BUN 30,  creatinine 2.39. GFR 27  IMPRESSION:    ICD-10-CM   1. Coronary artery disease of native artery of native heart with stable angina pectoris (HCC)  I25.118 VASCEPA 1 g capsule    sacubitril-valsartan (ENTRESTO) 24-26 MG    2. Ischemic cardiomyopathy  I25.5 sacubitril-valsartan (ENTRESTO) 24-26 MG    Basic metabolic panel    Pro b natriuretic peptide (BNP)    3. Status post insertion of drug eluting coronary artery stent  Z95.5 VASCEPA 1 g capsule    sacubitril-valsartan (ENTRESTO) 24-26 MG    4. Mixed hyperlipidemia  E78.2     5. Chronic HFrEF (heart failure with reduced ejection fraction) (HCC)  I50.22 sacubitril-valsartan (ENTRESTO) 24-26 MG    6. Hx of CABG  Z95.1 VASCEPA 1 g capsule    7. Benign hypertension  I10     8. LBBB (left bundle branch block)  I44.7          RECOMMENDATIONS: Jesus Young is a 77 y.o. Caucasian male whose past medical history and cardiac risk factors include: CAD status post CABG in 2012, LBBB, HTN, IDDM, hyperlipidemia, chronic kidney disease, polycythemia, history of COVID-19 infection, advanced age.  Coronary artery disease of native artery of native heart with stable angina pectoris (HCC) Status post insertion of drug eluting coronary artery stent Has a stable angina at baseline without exacerbation of symptoms. Use of sublingual nitroglycerin tablets on a as needed basis. Is Imdur was reduced from 60 mg to 30 mg p.o. daily due to blood pressures in the past and Ranexa was reduced from 500 mg p.o. twice daily to p.o. daily given his renal  function. He averages about 1 tablet of sublingual nitroglycerin at night before he goes to sleep and at times thinks it is psychosomatic. Recent hospitalization records reviewed and cardiac database updated. The culprit for his underlying angina and his likely PLB branch, but interventional cardiology recommended medical therapy for now given his renal function. LDL and LP(a) at goal. Triglycerides could be better controlled-will start Vascepa.  Will need repeat labs to reevaluate LFTs and lipids after 6 weeks of therapy Continue current medical therapy.  Chronic HFrEF (heart failure with reduced ejection fraction) (HCC) Ischemic cardiomyopathy Hx of CABG Stage C, NYHA II 08/2022: LVEF 25-30%. 07/2023: LVEF 30-35%. In the past uptitration of GDMT has been difficult due to soft blood pressures and CKD. Will restart Entresto 24/26 mg p.o. twice daily at the patient's request as he has noted significant improvement in his angina as well as congestion.  We did discuss the role of low-dose losartan and diuretics but he would like to hold off and retrial Entresto. Patient states that his home weight is usually between 170-173 pounds. He does use Lasix on as needed basis. Of asked him to use Lasix if his weight is more than 173 pounds and especially if he gains 1 to 1 pound over 24 hours or 3 pounds over a week. For now would defer diuresis management to nephrology who he sees next week Friday. Medications reconciled. Strict I's and O's and daily weights  Benign hypertension Office blood pressures are not at goal. Medication changes as discussed above. Monitor for now.     FINAL MEDICATION LIST END OF ENCOUNTER: Meds ordered this encounter  Medications   VASCEPA 1 g capsule    Sig: Take 2 capsules (2 g total) by mouth 2 (two) times daily.    Dispense:  360 capsule    Refill:  0   sacubitril-valsartan (ENTRESTO)  24-26 MG    Sig: Take 1 tablet by mouth 2 (two) times daily.     Medications Discontinued During This Encounter  Medication Reason   pantoprazole (PROTONIX) 40 MG tablet       Current Outpatient Medications:    acetaminophen (TYLENOL) 650 MG CR tablet, Take 650 mg by mouth every 8 (eight) hours as needed for pain., Disp: , Rfl:    aspirin EC 81 MG tablet, Take 81 mg by mouth at bedtime. , Disp: , Rfl:    B-D ULTRAFINE III SHORT PEN 31G X 8 MM MISC, USE UTD, Disp: , Rfl: 0   BRILINTA 90 MG TABS tablet, TAKE 1 TABLET(90 MG) BY MOUTH TWICE DAILY, Disp: 60 tablet, Rfl: 3   busPIRone (BUSPAR) 10 MG tablet, Take 20 mg by mouth 2 (two) times daily., Disp: , Rfl:    Calcium Citrate-Vitamin D 315-5 MG-MCG TABS, Take 1 tablet by mouth daily at 12 noon., Disp: , Rfl:    Docusate Sodium (GENTLE STOOL SOFTENER PO), Take 600 mg by mouth daily., Disp: , Rfl:    famotidine (PEPCID) 20 MG tablet, TAKE 1 TABLET(20 MG) BY MOUTH AT BEDTIME, Disp: 90 tablet, Rfl: 1   HUMULIN N KWIKPEN 100 UNIT/ML KwikPen, Inject into the skin., Disp: , Rfl:    insulin lispro (HUMALOG) 100 UNIT/ML injection, Inject 0.05-0.09 mLs (5-9 Units total) into the skin 2 (two) times daily. 5 units at lunch, and 9 units after dinner. (Patient taking differently: Inject 5-15 Units into the skin 2 (two) times daily. 5-15 units as needed  (sliding scale insulin)), Disp: 10 mL, Rfl: 1   isosorbide mononitrate (IMDUR) 30 MG 24 hr tablet, Take 1 tablet (30 mg total) by mouth daily., Disp: , Rfl:    JARDIANCE 10 MG TABS tablet, TAKE 1 TABLET(10 MG) BY MOUTH DAILY BEFORE BREAKFAST, Disp: 90 tablet, Rfl: 0   levothyroxine (SYNTHROID) 88 MCG tablet, Take 88 mcg by mouth daily., Disp: , Rfl:    meclizine (ANTIVERT) 25 MG tablet, Take 25 mg by mouth 3 (three) times daily as needed (for vertigo)., Disp: , Rfl:    metoprolol succinate (TOPROL-XL) 50 MG 24 hr tablet, TAKE 1 TABLET(50 MG) BY MOUTH DAILY WITH OR IMMEDIATELY FOLLOWING A MEAL (Patient taking differently: Take 50 mg by mouth in the morning and at bedtime.),  Disp: 90 tablet, Rfl: 1   Multiple Vitamins-Minerals (CENTRUM SILVER) tablet, Take 1 tablet by mouth daily., Disp: , Rfl:    nitroGLYCERIN (NITROSTAT) 0.4 MG SL tablet, Place 1 tablet (0.4 mg total) under the tongue every 5 (five) minutes as needed for chest pain., Disp: 30 tablet, Rfl: 12   ONE TOUCH ULTRA TEST test strip, , Disp: , Rfl:    ranolazine (RANEXA) 500 MG 12 hr tablet, Take 1 tablet (500 mg total) by mouth daily., Disp: , Rfl:    rosuvastatin (CRESTOR) 40 MG tablet, Take 40 mg by mouth at bedtime., Disp: , Rfl:    sacubitril-valsartan (ENTRESTO) 24-26 MG, Take 1 tablet by mouth 2 (two) times daily., Disp: , Rfl:    VASCEPA 1 g capsule, Take 2 capsules (2 g total) by mouth 2 (two) times daily., Disp: 360 capsule, Rfl: 0   furosemide (LASIX) 40 MG tablet, Take 1 tablet (40 mg total) by mouth daily as needed for fluid (If weight goes up by 1 pound over 24 hours or 3 pounds over a week). TAKE 1 TABLET(40 MG) BY MOUTH DAILY, Disp: , Rfl:    pantoprazole (PROTONIX) 40  MG tablet, Take by mouth. (Patient not taking: Reported on 07/25/2023), Disp: , Rfl:   Orders Placed This Encounter  Procedures   Basic metabolic panel   Pro b natriuretic peptide (BNP)    There are no Patient Instructions on file for this visit.   --Continue cardiac medications as reconciled in final medication list. --Return in about 3 months (around 10/24/2023) for Follow up, CAD. or sooner if needed. --Continue follow-up with your primary care physician regarding the management of your other chronic comorbid conditions.  Patient's questions and concerns were addressed to his satisfaction. He voices understanding of the instructions provided during this encounter.   This note was created using a voice recognition software as a result there may be grammatical errors inadvertently enclosed that do not reflect the nature of this encounter. Every attempt is made to correct such errors.  Jesus Young, [place], [facility]  Pager:   [phone] Office: [phone]

## 2023-08-03 DIAGNOSIS — I700 Atherosclerosis of aorta: Secondary | ICD-10-CM | POA: Diagnosis not present

## 2023-08-03 DIAGNOSIS — E1121 Type 2 diabetes mellitus with diabetic nephropathy: Secondary | ICD-10-CM | POA: Diagnosis not present

## 2023-08-03 DIAGNOSIS — I5042 Chronic combined systolic (congestive) and diastolic (congestive) heart failure: Secondary | ICD-10-CM | POA: Diagnosis not present

## 2023-08-03 DIAGNOSIS — M7582 Other shoulder lesions, left shoulder: Secondary | ICD-10-CM | POA: Diagnosis not present

## 2023-08-03 DIAGNOSIS — J432 Centrilobular emphysema: Secondary | ICD-10-CM | POA: Diagnosis not present

## 2023-08-03 DIAGNOSIS — M25512 Pain in left shoulder: Secondary | ICD-10-CM | POA: Diagnosis not present

## 2023-08-03 DIAGNOSIS — I130 Hypertensive heart and chronic kidney disease with heart failure and stage 1 through stage 4 chronic kidney disease, or unspecified chronic kidney disease: Secondary | ICD-10-CM | POA: Diagnosis not present

## 2023-08-05 ENCOUNTER — Other Ambulatory Visit: Payer: Self-pay | Admitting: Cardiology

## 2023-08-05 DIAGNOSIS — R0600 Dyspnea, unspecified: Secondary | ICD-10-CM | POA: Diagnosis not present

## 2023-08-05 DIAGNOSIS — I25118 Atherosclerotic heart disease of native coronary artery with other forms of angina pectoris: Secondary | ICD-10-CM

## 2023-08-05 DIAGNOSIS — I255 Ischemic cardiomyopathy: Secondary | ICD-10-CM | POA: Diagnosis not present

## 2023-08-06 LAB — BASIC METABOLIC PANEL
BUN/Creatinine Ratio: 14 (ref 10–24)
BUN: 38 mg/dL — ABNORMAL HIGH (ref 8–27)
CO2: 19 mmol/L — ABNORMAL LOW (ref 20–29)
Calcium: 9.6 mg/dL (ref 8.6–10.2)
Chloride: 106 mmol/L (ref 96–106)
Creatinine, Ser: 2.64 mg/dL — ABNORMAL HIGH (ref 0.76–1.27)
Glucose: 162 mg/dL — ABNORMAL HIGH (ref 70–99)
Potassium: 4.9 mmol/L (ref 3.5–5.2)
Sodium: 140 mmol/L (ref 134–144)
eGFR: 24 mL/min/{1.73_m2} — ABNORMAL LOW (ref >59)

## 2023-08-06 LAB — PRO B NATRIURETIC PEPTIDE: NT-Pro BNP: 8865 pg/mL — ABNORMAL HIGH (ref 0–486)

## 2023-08-10 DIAGNOSIS — J432 Centrilobular emphysema: Secondary | ICD-10-CM | POA: Diagnosis not present

## 2023-08-10 DIAGNOSIS — I129 Hypertensive chronic kidney disease with stage 1 through stage 4 chronic kidney disease, or unspecified chronic kidney disease: Secondary | ICD-10-CM | POA: Diagnosis not present

## 2023-08-10 DIAGNOSIS — D751 Secondary polycythemia: Secondary | ICD-10-CM | POA: Diagnosis not present

## 2023-08-10 DIAGNOSIS — E782 Mixed hyperlipidemia: Secondary | ICD-10-CM | POA: Diagnosis not present

## 2023-08-10 DIAGNOSIS — I130 Hypertensive heart and chronic kidney disease with heart failure and stage 1 through stage 4 chronic kidney disease, or unspecified chronic kidney disease: Secondary | ICD-10-CM | POA: Diagnosis not present

## 2023-08-10 DIAGNOSIS — I700 Atherosclerosis of aorta: Secondary | ICD-10-CM | POA: Diagnosis not present

## 2023-08-10 DIAGNOSIS — N1832 Chronic kidney disease, stage 3b: Secondary | ICD-10-CM | POA: Diagnosis not present

## 2023-08-10 DIAGNOSIS — D692 Other nonthrombocytopenic purpura: Secondary | ICD-10-CM | POA: Diagnosis not present

## 2023-08-10 DIAGNOSIS — I25118 Atherosclerotic heart disease of native coronary artery with other forms of angina pectoris: Secondary | ICD-10-CM | POA: Diagnosis not present

## 2023-08-10 DIAGNOSIS — I5042 Chronic combined systolic (congestive) and diastolic (congestive) heart failure: Secondary | ICD-10-CM | POA: Diagnosis not present

## 2023-08-10 DIAGNOSIS — E1121 Type 2 diabetes mellitus with diabetic nephropathy: Secondary | ICD-10-CM | POA: Diagnosis not present

## 2023-08-10 DIAGNOSIS — Z23 Encounter for immunization: Secondary | ICD-10-CM | POA: Diagnosis not present

## 2023-08-24 ENCOUNTER — Inpatient Hospital Stay: Payer: Medicare Other | Attending: Hematology and Oncology

## 2023-08-24 ENCOUNTER — Inpatient Hospital Stay: Payer: Medicare Other | Admitting: Hematology and Oncology

## 2023-08-24 VITALS — BP 131/67 | HR 64 | Temp 97.5°F | Resp 18 | Ht 75.0 in | Wt 169.9 lb

## 2023-08-24 DIAGNOSIS — D45 Polycythemia vera: Secondary | ICD-10-CM | POA: Diagnosis not present

## 2023-08-24 DIAGNOSIS — Z7902 Long term (current) use of antithrombotics/antiplatelets: Secondary | ICD-10-CM | POA: Insufficient documentation

## 2023-08-24 DIAGNOSIS — Z79899 Other long term (current) drug therapy: Secondary | ICD-10-CM | POA: Diagnosis not present

## 2023-08-24 DIAGNOSIS — Z79890 Hormone replacement therapy (postmenopausal): Secondary | ICD-10-CM | POA: Insufficient documentation

## 2023-08-24 DIAGNOSIS — Z7982 Long term (current) use of aspirin: Secondary | ICD-10-CM | POA: Insufficient documentation

## 2023-08-24 DIAGNOSIS — I509 Heart failure, unspecified: Secondary | ICD-10-CM | POA: Insufficient documentation

## 2023-08-24 LAB — CBC WITH DIFFERENTIAL (CANCER CENTER ONLY)
Abs Immature Granulocytes: 0.02 10*3/uL (ref 0.00–0.07)
Basophils Absolute: 0 10*3/uL (ref 0.0–0.1)
Basophils Relative: 1 %
Eosinophils Absolute: 0.2 10*3/uL (ref 0.0–0.5)
Eosinophils Relative: 3 %
HCT: 46.8 % (ref 39.0–52.0)
Hemoglobin: 15 g/dL (ref 13.0–17.0)
Immature Granulocytes: 0 %
Lymphocytes Relative: 15 %
Lymphs Abs: 0.9 10*3/uL (ref 0.7–4.0)
MCH: 32.7 pg (ref 26.0–34.0)
MCHC: 32.1 g/dL (ref 30.0–36.0)
MCV: 102 fL — ABNORMAL HIGH (ref 80.0–100.0)
Monocytes Absolute: 0.5 10*3/uL (ref 0.1–1.0)
Monocytes Relative: 9 %
Neutro Abs: 4.5 10*3/uL (ref 1.7–7.7)
Neutrophils Relative %: 72 %
Platelet Count: 166 10*3/uL (ref 150–400)
RBC: 4.59 MIL/uL (ref 4.22–5.81)
RDW: 13.2 % (ref 11.5–15.5)
WBC Count: 6.2 10*3/uL (ref 4.0–10.5)
nRBC: 0 % (ref 0.0–0.2)

## 2023-08-24 LAB — CMP (CANCER CENTER ONLY)
ALT: 19 U/L (ref 0–44)
AST: 17 U/L (ref 15–41)
Albumin: 4.3 g/dL (ref 3.5–5.0)
Alkaline Phosphatase: 42 U/L (ref 38–126)
Anion gap: 6 (ref 5–15)
BUN: 50 mg/dL — ABNORMAL HIGH (ref 8–23)
CO2: 21 mmol/L — ABNORMAL LOW (ref 22–32)
Calcium: 9.3 mg/dL (ref 8.9–10.3)
Chloride: 109 mmol/L (ref 98–111)
Creatinine: 3.01 mg/dL — ABNORMAL HIGH (ref 0.61–1.24)
GFR, Estimated: 21 mL/min — ABNORMAL LOW
Glucose, Bld: 143 mg/dL — ABNORMAL HIGH (ref 70–99)
Potassium: 5 mmol/L (ref 3.5–5.1)
Sodium: 136 mmol/L (ref 135–145)
Total Bilirubin: 0.5 mg/dL (ref 0.3–1.2)
Total Protein: 6.9 g/dL (ref 6.5–8.1)

## 2023-08-24 NOTE — Progress Notes (Signed)
Patient Care Team: Georgianne Fick, MD as PCP - General (Internal Medicine) [name], MD as Consulting Physician (Cardiology)  DIAGNOSIS:  Encounter Diagnosis  Name Primary?   Polycythemia vera (HCC) Yes    CHIEF COMPLIANT: F/U of Polycythemia   History of Present Illness   The patient, with a history of polycythemia vera, congestive heart failure, and angina, presents for a routine follow-up. he reports a recent hospitalization for suspected myocardial infarction, which was ultimately attributed to plaque rupture. he describes a significant improvement in her angina symptoms, from eight episodes daily to three to four times weekly, after medication adjustments to balance cardiac and renal effects. he also reports a recent improvement in left arm pain, initially thought to be cardiac in origin, but ultimately attributed to arthritis and managed with an injection. Despite these improvements, he continues to experience frequent angina episodes, which has led to his disqualification from blood donation, a previous method of managing her polycythemia vera.       ALLERGIES:  is allergic to tetanus antitoxin, clonazepam, and lisinopril.  MEDICATIONS:  Current Outpatient Medications  Medication Sig Dispense Refill   acetaminophen (TYLENOL) 650 MG CR tablet Take 650 mg by mouth every 8 (eight) hours as needed for pain.     aspirin EC 81 MG tablet Take 81 mg by mouth at bedtime.      B-D ULTRAFINE III SHORT PEN 31G X 8 MM MISC USE UTD  0   BRILINTA 90 MG TABS tablet TAKE 1 TABLET(90 MG) BY MOUTH TWICE DAILY 60 tablet 3   busPIRone (BUSPAR) 10 MG tablet Take 20 mg by mouth 2 (two) times daily.     Calcium Citrate-Vitamin D 315-5 MG-MCG TABS Take 1 tablet by mouth daily at 12 noon.     Docusate Sodium (GENTLE STOOL SOFTENER PO) Take 600 mg by mouth daily.     famotidine (PEPCID) 20 MG tablet TAKE 1 TABLET(20 MG) BY MOUTH AT BEDTIME 90 tablet 1   HUMULIN N KWIKPEN 100 UNIT/ML KwikPen  Inject into the skin.     insulin lispro (HUMALOG) 100 UNIT/ML injection Inject 0.05-0.09 mLs (5-9 Units total) into the skin 2 (two) times daily. 5 units at lunch, and 9 units after dinner. (Patient taking differently: Inject 5-15 Units into the skin 2 (two) times daily. 5-15 units as needed  (sliding scale insulin)) 10 mL 1   isosorbide mononitrate (IMDUR) 60 MG 24 hr tablet TAKE 1 TABLET(60 MG) BY MOUTH DAILY 90 tablet 3   JARDIANCE 10 MG TABS tablet TAKE 1 TABLET(10 MG) BY MOUTH DAILY BEFORE BREAKFAST 90 tablet 0   levothyroxine (SYNTHROID) 88 MCG tablet Take 88 mcg by mouth daily.     meclizine (ANTIVERT) 25 MG tablet Take 25 mg by mouth 3 (three) times daily as needed (for vertigo).     metoprolol succinate (TOPROL-XL) 50 MG 24 hr tablet TAKE 1 TABLET(50 MG) BY MOUTH DAILY WITH OR IMMEDIATELY FOLLOWING A MEAL (Patient taking differently: Take 50 mg by mouth in the morning and at bedtime.) 90 tablet 1   Multiple Vitamins-Minerals (CENTRUM SILVER) tablet Take 1 tablet by mouth daily.     ONE TOUCH ULTRA TEST test strip      ranolazine (RANEXA) 500 MG 12 hr tablet Take 1 tablet (500 mg total) by mouth daily.     rosuvastatin (CRESTOR) 40 MG tablet Take 40 mg by mouth at bedtime.     sacubitril-valsartan (ENTRESTO) 24-26 MG Take 1 tablet by mouth 2 (two) times daily.  VASCEPA 1 g capsule Take 2 capsules (2 g total) by mouth 2 (two) times daily. 360 capsule 0   furosemide (LASIX) 40 MG tablet Take 1 tablet (40 mg total) by mouth daily as needed for fluid (If weight goes up by 1 pound over 24 hours or 3 pounds over a week). TAKE 1 TABLET(40 MG) BY MOUTH DAILY     isosorbide mononitrate (IMDUR) 30 MG 24 hr tablet Take 1 tablet (30 mg total) by mouth daily.     nitroGLYCERIN (NITROSTAT) 0.4 MG SL tablet Place 1 tablet (0.4 mg total) under the tongue every 5 (five) minutes as needed for chest pain. 30 tablet 12   No current facility-administered medications for this visit.    PHYSICAL  EXAMINATION: ECOG PERFORMANCE STATUS: 1 - Symptomatic but completely ambulatory  Vitals:   08/24/23 1106  BP: 131/67  Pulse: 64  Resp: 18  Temp: (!) 97.5 F (36.4 C)  SpO2: 99%   Filed Weights   08/24/23 1106  Weight: 169 lb 14.4 oz (77.1 kg)      LABORATORY DATA:  I have reviewed the data as listed    Latest Ref Rng & Units 08/24/2023   10:38 AM 08/05/2023    2:29 PM 07/20/2023    1:06 PM  CMP  Glucose 70 - 99 mg/dL 409  811  914   BUN 8 - 23 mg/dL 50  38  32   Creatinine 0.61 - 1.24 mg/dL 7.82  9.56  2.13   Sodium 135 - 145 mmol/L 136  140  137   Potassium 3.5 - 5.1 mmol/L 5.0  4.9  5.1   Chloride 98 - 111 mmol/L 109  106  104   CO2 22 - 32 mmol/L 21  19  18    Calcium 8.9 - 10.3 mg/dL 9.3  9.6  9.7   Total Protein 6.5 - 8.1 g/dL 6.9     Total Bilirubin 0.3 - 1.2 mg/dL 0.5     Alkaline Phos 38 - 126 U/L 42     AST 15 - 41 U/L 17     ALT 0 - 44 U/L 19       Lab Results  Component Value Date   WBC 6.2 08/24/2023   HGB 15.0 08/24/2023   HCT 46.8 08/24/2023   MCV 102.0 (H) 08/24/2023   PLT 166 08/24/2023   NEUTROABS 4.5 08/24/2023    ASSESSMENT & PLAN:  Polycythemia vera (HCC) Lab review: 08/22/2021: WBC 7.8, hemoglobin 15.9, platelets 214 12/28/2021: WBC 6.2, hemoglobin 18.4, hematocrit 55.8, platelets 219  Erythropoietin levels: 16.9: Normal 08/23/2022: Hemoglobin 14.3 07/17/2023: Hemoglobin 12.5   JAK2 mutation testing:JAK2 p.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 Allena Katz. Hospitalization 07/16/2023-07/17/2023: Unstable angina    Angina and Congestive Heart Failure Frequent angina episodes, up to 3-4 times a week. Medication regimen adjusted to balance heart and kidney health. Recent hospitalization in August 2024 due to suspected heart attack, but no surgery was performed. -Continue current medication regimen. -Continue monitoring  symptoms and adjust medication as needed.  Polycythemia Hematocrit below 50, indicating good control of polycythemia. Patient was previously donating blood but was rejected due to heart condition. -Schedule follow-up in six months to monitor hematocrit levels. -Consider therapeutic phlebotomy in the office if necessary.  Arthritis Pain in the left arm, possibly related to arthritis. Received a shot for pain relief. -Continue current treatment for arthritis.  General Health Maintenance -Continue regular visits with kidney specialist.      Return to clinic in  6 months for follow-up with labs.    Orders Placed This Encounter  Procedures   CBC with Differential (Cancer Center Only)    Standing Status:   Future    Standing Expiration Date:   08/23/2024   The patient has a good understanding of the overall plan. he agrees with it. he will call with any problems that may develop before the next visit here. Total time spent: 30 mins including face to face time and time spent for planning, charting and co-ordination of care   Tamsen Meek, MD 08/24/23

## 2023-08-24 NOTE — Assessment & Plan Note (Signed)
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 Allena Katz. Hospitalization 07/16/2023-07/17/2023: Unstable angina Return to clinic in 1 year for follow-up with labs.

## 2023-08-25 DIAGNOSIS — N179 Acute kidney failure, unspecified: Secondary | ICD-10-CM | POA: Diagnosis not present

## 2023-08-25 DIAGNOSIS — I129 Hypertensive chronic kidney disease with stage 1 through stage 4 chronic kidney disease, or unspecified chronic kidney disease: Secondary | ICD-10-CM | POA: Diagnosis not present

## 2023-08-25 DIAGNOSIS — D631 Anemia in chronic kidney disease: Secondary | ICD-10-CM | POA: Diagnosis not present

## 2023-08-25 DIAGNOSIS — N184 Chronic kidney disease, stage 4 (severe): Secondary | ICD-10-CM | POA: Diagnosis not present

## 2023-08-25 DIAGNOSIS — I214 Non-ST elevation (NSTEMI) myocardial infarction: Secondary | ICD-10-CM | POA: Diagnosis not present

## 2023-08-25 DIAGNOSIS — I2510 Atherosclerotic heart disease of native coronary artery without angina pectoris: Secondary | ICD-10-CM | POA: Diagnosis not present

## 2023-08-25 DIAGNOSIS — E1122 Type 2 diabetes mellitus with diabetic chronic kidney disease: Secondary | ICD-10-CM | POA: Diagnosis not present

## 2023-08-25 DIAGNOSIS — I5022 Chronic systolic (congestive) heart failure: Secondary | ICD-10-CM | POA: Diagnosis not present

## 2023-08-31 DIAGNOSIS — E1165 Type 2 diabetes mellitus with hyperglycemia: Secondary | ICD-10-CM | POA: Diagnosis not present

## 2023-08-31 DIAGNOSIS — I5042 Chronic combined systolic (congestive) and diastolic (congestive) heart failure: Secondary | ICD-10-CM | POA: Diagnosis not present

## 2023-08-31 DIAGNOSIS — E039 Hypothyroidism, unspecified: Secondary | ICD-10-CM | POA: Diagnosis not present

## 2023-08-31 DIAGNOSIS — I130 Hypertensive heart and chronic kidney disease with heart failure and stage 1 through stage 4 chronic kidney disease, or unspecified chronic kidney disease: Secondary | ICD-10-CM | POA: Diagnosis not present

## 2023-08-31 DIAGNOSIS — E782 Mixed hyperlipidemia: Secondary | ICD-10-CM | POA: Diagnosis not present

## 2023-08-31 DIAGNOSIS — N184 Chronic kidney disease, stage 4 (severe): Secondary | ICD-10-CM | POA: Diagnosis not present

## 2023-09-25 ENCOUNTER — Other Ambulatory Visit: Payer: Self-pay | Admitting: Cardiology

## 2023-09-25 DIAGNOSIS — I25118 Atherosclerotic heart disease of native coronary artery with other forms of angina pectoris: Secondary | ICD-10-CM

## 2023-09-26 ENCOUNTER — Telehealth: Payer: Self-pay | Admitting: Cardiology

## 2023-09-26 NOTE — Telephone Encounter (Signed)
Wife Bonita Quin) stated they will need assistance preparing patient's insurance paperwork for denial of medications.

## 2023-09-26 NOTE — Telephone Encounter (Signed)
Spoke to wife and she states he was admitted in August and the insurance denied claim from the hospital. Advised to call the hospital to speak with billing

## 2023-09-29 ENCOUNTER — Encounter: Payer: Self-pay | Admitting: Podiatry

## 2023-09-29 ENCOUNTER — Ambulatory Visit: Payer: Medicare Other | Admitting: Podiatry

## 2023-09-29 DIAGNOSIS — M79676 Pain in unspecified toe(s): Secondary | ICD-10-CM

## 2023-09-29 DIAGNOSIS — D2371 Other benign neoplasm of skin of right lower limb, including hip: Secondary | ICD-10-CM

## 2023-09-29 DIAGNOSIS — B351 Tinea unguium: Secondary | ICD-10-CM

## 2023-10-02 NOTE — Progress Notes (Unsigned)
He presents today for follow-up of his painful elongated toenails and calluses bilateral foot.  Objective: Vital signs are stable he is alert oriented x 3 there is no erythema Dem salines drainage noted toenails are long thick yellow dystrophic and mycotic sharply incurvated and painful palpation.  He also has painful benign skin lesions.  Assessment: Pain in limb secondary to onychomycosis and benign skin lesion.  Plan: Debridement of toenails and calluses bilateral

## 2023-10-11 ENCOUNTER — Other Ambulatory Visit: Payer: Self-pay | Admitting: Cardiology

## 2023-10-11 DIAGNOSIS — I5022 Chronic systolic (congestive) heart failure: Secondary | ICD-10-CM

## 2023-10-24 ENCOUNTER — Encounter: Payer: Self-pay | Admitting: Cardiology

## 2023-10-24 ENCOUNTER — Ambulatory Visit: Payer: Medicare Other | Attending: Cardiology | Admitting: Cardiology

## 2023-10-24 VITALS — BP 110/68 | HR 74 | Resp 16 | Ht 75.0 in | Wt 174.0 lb

## 2023-10-24 DIAGNOSIS — Z951 Presence of aortocoronary bypass graft: Secondary | ICD-10-CM

## 2023-10-24 DIAGNOSIS — I255 Ischemic cardiomyopathy: Secondary | ICD-10-CM | POA: Diagnosis not present

## 2023-10-24 DIAGNOSIS — Z955 Presence of coronary angioplasty implant and graft: Secondary | ICD-10-CM | POA: Diagnosis not present

## 2023-10-24 DIAGNOSIS — I5022 Chronic systolic (congestive) heart failure: Secondary | ICD-10-CM

## 2023-10-24 DIAGNOSIS — N184 Chronic kidney disease, stage 4 (severe): Secondary | ICD-10-CM | POA: Diagnosis not present

## 2023-10-24 DIAGNOSIS — I25118 Atherosclerotic heart disease of native coronary artery with other forms of angina pectoris: Secondary | ICD-10-CM

## 2023-10-24 DIAGNOSIS — I130 Hypertensive heart and chronic kidney disease with heart failure and stage 1 through stage 4 chronic kidney disease, or unspecified chronic kidney disease: Secondary | ICD-10-CM

## 2023-10-24 DIAGNOSIS — E782 Mixed hyperlipidemia: Secondary | ICD-10-CM

## 2023-10-24 DIAGNOSIS — I447 Left bundle-branch block, unspecified: Secondary | ICD-10-CM

## 2023-10-24 MED ORDER — NITROGLYCERIN 0.4 MG SL SUBL
0.4000 mg | SUBLINGUAL_TABLET | SUBLINGUAL | 1 refills | Status: DC | PRN
Start: 2023-10-24 — End: 2024-05-17

## 2023-10-24 NOTE — Progress Notes (Signed)
Cardiology Office Note:  .   Date:  10/24/2023  ID:  Jesus Young, DOB [birth year], MRN [id number] PCP:  Georgianne Fick, MD  Former Cardiology Providers: NA [facility] Providers Cardiologist:  [name], DO , [facility] (established care 08/2022), Dr. Lupita Shutter at [facility]  Electrophysiologist:  None  Click to update primary MD,subspecialty MD or APP then REFRESH:1}    Chief Complaint  Patient presents with   Coronary artery disease of native artery of native heart wi   Follow-up    3 months    History of Present Illness: .   Jesus Young is a 77 y.o. Caucasian male whose past medical history and cardiovascular risk factors includes: CAD status post CABG in 2012, LBBB, HTN, IDDM, hyperlipidemia, chronic kidney disease, polycythemia, history of COVID-19 infection, advanced age.   Known history of CAD status post CABG in 2012.  His initial NSTEMI was in October 2022 when he underwent PCI to the SVG to RCA graft.  He was treated medically until his second NSTEMI in October 2023.  Echocardiogram illustrated newly reduced LVEF and we discussed early invasive angiography versus medical management but given his progressive chronic kidney disease patient preferred medical management as contrast exposure could hasten worsening CKD leading to end-stage renal disease.   Despite up titration of medical therapy he had anginal symptoms and therefore referred to [facility] for evaluation of EECP.  He established care with Dr. Lupita Shutter and has been under his care.  He too has been focusing on medical therapy given his renal function.  Patient states that he was not considered a candidate for EECP.  He has known history of stable angina and required sublingual nitroglycerin tablets regularly.  He presented to the ED several months ago with chest pain consistent with unstable angina.  Despite high risk for developing acute kidney injury needing hemodialysis shared decision at that time was to proceed  with left heart catheterization.  His SVG to RCA stent was patent but disease in the PLV branch felt to be the culprit.  His SVG to OM1 branch was patent as well.  Minimal contrast was used and his renal function was preserved.  He presents today for 19-month follow-up visit.  Patient denies any active chest pain.  He has stable angina requiring 1 to 2 tablets of sublingual nitroglycerin tablets.  He is on low-dose Imdur as well as Entresto 24/26 mg p.o. twice daily.  His home weight is usually 170 pounds.  He has not required Lasix for lower extremity swelling or congestion over the last 3 months except 1 episode.  No recent labs available for review.   Review of Systems: .   Review of Systems  Cardiovascular:  Positive for chest pain (Stable). Negative for claudication, irregular heartbeat, leg swelling, near-syncope, orthopnea, palpitations, paroxysmal nocturnal dyspnea and syncope.  Respiratory:  Negative for shortness of breath.   Hematologic/Lymphatic: Negative for bleeding problem.    Studies Reviewed:   EKG: 06/17/2023: Sinus rhythm, 71 bpm, IVCD consistent with left bundle branch block, left axis, ST-T changes inferolateral leads cannot rule out ischemia.   Echocardiogram: 07/17/2023  1. Left ventricular ejection fraction, by estimation, is 30 to 35%. The  left ventricle has moderately decreased function. The left ventricle  demonstrates global hypokinesis. The left ventricular internal cavity size  was dilated. There is mild left  ventricular hypertrophy. Left ventricular diastolic parameters are  consistent with Grade I diastolic dysfunction (impaired relaxation). The  anterior wall, anterior septum, inferior wall, mid  inferoseptal segment,  and basal inferoseptal segment are  hypokinetic.   2. Right ventricular systolic function reduced (RV S' 8.9cm/s TAPSE  11mm). The right ventricular size is normal. Tricuspid regurgitation  signal is inadequate for assessing PA pressure.   3.  The mitral valve is grossly normal. Trivial mitral valve  regurgitation. No evidence of mitral stenosis.   4. The aortic valve is tricuspid. Aortic valve regurgitation is not  visualized. No aortic stenosis is present.   5. The inferior vena cava is normal in size with greater than 50%  respiratory variability, suggesting right atrial pressure of 3 mmHg.   Comparison(s): Prior study 08/27/2022: LVEF 25-30% with RWMA, LV size  dilated, Grade I diastolic dyfunction, RV function mildly reduced, RV size  normal, small pericardial effusion, mild MR, estimated RAP .   Heart Catheterization: Left Heart Catheterization 07/16/23:  Hemodynamic data:  LV 153/15, EDP 24 mmHg.  Ao 140/68, mean 100 mmHg.  No pressure gradient across the aortic valve.   Angiographic data:  Native arteries were not visualized as RCA and LAD and Cx are known occluded vessels.  LIMA to LAD not visualized as it is presumed to be patent. SVG to RCA: Widely patent 4.0 x 16 mm Synergy XD stent in the proximal segment placed on 08/21/2021, a moderate-sized PL branch previously 80 to 90% now is 99% stenosed with ulceration, culprit lesion. SVG to OM1: Widely patent.    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  However patient's creatinine had risen up to 4 mg, recently [name] has been discontinued and stat serum creatinine was around 2.3-2.4, as the vessel is only small, do not think this is: Make a major impact with regard to mortality benefit.  Patient also extremely concerned and initially did not want to proceed with angiography as well.  I have aborted any angioplasty as the vessel is diffusely diseased.  Will treat him medically with anticoagulation with heparin, patient is presently on Brilinta and aspirin, continue the same for now.  We could consider changing from Brilinta to Xarelto 2.5 mg twice daily along with aspirin 81 mg daily  to see whether his symptoms would improve empirically. 7 mL contrast utilized.  RADIOLOGY: N/A  Risk Assessment/Calculations:   NA   Labs:       Latest Ref Rng & Units 08/24/2023   10:38 AM 07/17/2023    2:25 AM 07/16/2023    4:19 PM  CBC  WBC 4.0 - 10.5 K/uL 6.2  5.1  4.8   Hemoglobin 13.0 - 17.0 g/dL 40.9  81.1  91.4   Hematocrit 39.0 - 52.0 % 46.8  39.5  38.3   Platelets 150 - 400 K/uL 166  162  158        Latest Ref Rng & Units 08/24/2023   10:38 AM 08/05/2023    2:29 PM 07/20/2023    1:06 PM  BMP  Glucose 70 - 99 mg/dL 782  956  213   BUN 8 - 23 mg/dL 50  38  32   Creatinine 0.61 - 1.24 mg/dL 0.86  5.78  4.69   BUN/Creat Ratio 10 - 24  14  12    Sodium 135 - 145 mmol/L 136  140  137   Potassium 3.5 - 5.1 mmol/L 5.0  4.9  5.1   Chloride 98 - 111 mmol/L 109  106  104   CO2 22 - 32 mmol/L 21  19  18   Calcium 8.9 - 10.3 mg/dL 9.3  9.6  9.7       Latest Ref Rng & Units 08/24/2023   10:38 AM 08/05/2023    2:29 PM 07/20/2023    1:06 PM  CMP  Glucose 70 - 99 mg/dL 161  096  045   BUN 8 - 23 mg/dL 50  38  32   Creatinine 0.61 - 1.24 mg/dL 4.09  8.11  9.14   Sodium 135 - 145 mmol/L 136  140  137   Potassium 3.5 - 5.1 mmol/L 5.0  4.9  5.1   Chloride 98 - 111 mmol/L 109  106  104   CO2 22 - 32 mmol/L 21  19  18    Calcium 8.9 - 10.3 mg/dL 9.3  9.6  9.7   Total Protein 6.5 - 8.1 g/dL 6.9     Total Bilirubin 0.3 - 1.2 mg/dL 0.5     Alkaline Phos 38 - 126 U/L 42     AST 15 - 41 U/L 17     ALT 0 - 44 U/L 19       Lab Results  Component Value Date   CHOL 116 07/20/2023   HDL 37 (L) 07/20/2023   LDLCALC 52 07/20/2023   LDLDIRECT 48 07/20/2023   TRIG 155 (H) 07/20/2023   CHOLHDL 3.6 07/17/2023   Recent Labs    07/17/23 0225  LIPOA <8.4   No components found for: "NTPROBNP" Recent Labs    11/02/22 1324 11/19/22 1435 08/05/23 1429  PROBNP 9,659* 4,941* 8,865*   No results for input(s): "TSH" in the last 8760 hours.  Physical Exam:    Today's Vitals    10/24/23 1515  BP: 110/68  Pulse: 74  Resp: 16  SpO2: 98%  Weight: 174 lb (78.9 kg)  Height: 6\' 3"  (1.905 m)   Body mass index is 21.75 kg/m. Wt Readings from Last 3 Encounters:  10/24/23 174 lb (78.9 kg)  08/24/23 169 lb 14.4 oz (77.1 kg)  07/25/23 171 lb 12.8 oz (77.9 kg)    Physical Exam  Constitutional: No distress.  Age appropriate, hemodynamically stable.   Neck: No JVD present.  Cardiovascular: Normal rate, regular rhythm, S1 normal, S2 normal, intact distal pulses and normal pulses. Exam reveals no gallop, no S3 and no S4.  No murmur heard. Pulses:      Dorsalis pedis pulses are 2+ on the right side and 2+ on the left side.       Posterior tibial pulses are 2+ on the right side and 2+ on the left side.  Pulmonary/Chest: Effort normal and breath sounds normal. No stridor. He has no wheezes. He has no rales.  Abdominal: Soft. Bowel sounds are normal. He exhibits no distension. There is no abdominal tenderness.  Musculoskeletal:        General: No edema.     Cervical back: Neck supple.  Neurological: He is alert and oriented to person, place, and time. He has intact cranial nerves (2-12).  Skin: Skin is warm and moist.   Impression & Recommendation(s):  Impression:   ICD-10-CM   1. Coronary artery disease of native artery of native heart with stable angina pectoris (HCC)  I25.118 nitroGLYCERIN (NITROSTAT) 0.4 MG SL tablet    2. Ischemic cardiomyopathy  I25.5     3. Status post insertion of drug eluting coronary artery stent  Z95.5     4. Mixed hyperlipidemia  E78.2     5. Chronic HFrEF (heart failure  with reduced ejection fraction) (HCC)  I50.22     6. Hx of CABG  Z95.1     7. 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 LBBB (left bundle branch block)  I44.7        Recommendation(s):  Coronary artery disease of native artery of native heart with stable angina pectoris (HCC) Ischemic  cardiomyopathy Status post insertion of drug eluting coronary artery stent Hx of CABG Known history of stable angina requiring 1 to 2 tablets of sublingual nitroglycerin No worsening discomfort since last office visit. When he feels congested usually takes Lasix on as needed basis-required 1 tablet over the last 3 months. Antianginal therapies include: Metoprolol, Ranexa, Imdur, and nitro tablets In the past we requested being on Entresto as he feels better and less congested when taking it.   He is currently on Entresto 24/26 mg p.o. twice daily.   No recent labs available for review.  He follows up with nephrology tomorrow and will have renal function checked. Patient has done well on Vascepa and has lipids as of September 2024 have improved.  The  lipids in September may be prematurely checked.  He will have fasting lipids checked with PCP or nephrology and send [place] a copy for reference. Patient chooses to follow-up with myself as well as [facility] cardiology Dr. Lupita Shutter. Refill nitro  Mixed hyperlipidemia Hypertriglyceridemia Currently on Crestor 20 mg p.o. daily, Vascepa 2 g twice daily.   He denies myalgia or other side effects. Will do fasting lipids with nephrology tomorrow.  Will await results.  Chronic HFrEF (heart failure with reduced ejection fraction) (HCC) Stage C, NYHA class II 08/2022: LVEF 25-30%. 07/2023: LVEF 30-35%. Uptitration of GDMT has been difficult due to soft blood pressures and CKD. Medications reconciled. Home blood pressures are very well-controlled. If his weights are 170 pounds he remains euvolemic and asymptomatic.  However if he gains more than 1 pound over day or 3 pounds in a week he does use Lasix on as needed basis. 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 Office blood pressures are very well-controlled. Continue current GDMT and antianginal therapy  Orders Placed:  No orders of  the defined types were placed in this encounter.   Final Medication List:    Meds ordered this encounter  Medications   nitroGLYCERIN (NITROSTAT) 0.4 MG SL tablet    Sig: Place 1 tablet (0.4 mg total) under the tongue every 5 (five) minutes as needed for up to 180 doses for chest pain.    Dispense:  90 tablet    Refill:  1    Medications Discontinued During This Encounter  Medication Reason   isosorbide mononitrate (IMDUR) 60 MG 24 hr tablet Duplicate   nitroGLYCERIN (NITROSTAT) 0.4 MG SL tablet Reorder     Current Outpatient Medications:    acetaminophen (TYLENOL) 650 MG CR tablet, Take 650 mg by mouth every 8 (eight) hours as needed for pain., Disp: , Rfl:    aspirin EC 81 MG tablet, Take 81 mg by mouth at bedtime. , Disp: , Rfl:    B-D ULTRAFINE III SHORT PEN 31G X 8 MM MISC, USE UTD, Disp: , Rfl: 0   BRILINTA 90 MG TABS tablet, TAKE 1 TABLET(90 MG) BY MOUTH TWICE DAILY, Disp: 60 tablet, Rfl: 3   busPIRone (BUSPAR) 10 MG tablet, Take  20 mg by mouth 2 (two) times daily., Disp: , Rfl:    Calcium Citrate-Vitamin D 315-5 MG-MCG TABS, Take 1 tablet by mouth daily at 12 noon., Disp: , Rfl:    Docusate Sodium (GENTLE STOOL SOFTENER PO), Take 600 mg by mouth daily., Disp: , Rfl:    empagliflozin (JARDIANCE) 10 MG TABS tablet, TAKE 1 TABLET(10 MG) BY MOUTH DAILY BEFORE BREAKFAST, Disp: 90 tablet, Rfl: 3   famotidine (PEPCID) 20 MG tablet, TAKE 1 TABLET(20 MG) BY MOUTH AT BEDTIME (Patient taking differently: Take 20 mg by mouth daily.), Disp: 90 tablet, Rfl: 1   furosemide (LASIX) 40 MG tablet, Take 1 tablet (40 mg total) by mouth daily as needed for fluid (If weight goes up by 1 pound over 24 hours or 3 pounds over a week). TAKE 1 TABLET(40 MG) BY MOUTH DAILY, Disp: , Rfl:    HUMULIN N KWIKPEN 100 UNIT/ML KwikPen, Inject into the skin., Disp: , Rfl:    insulin lispro (HUMALOG) 100 UNIT/ML injection, Inject 0.05-0.09 mLs (5-9 Units total) into the skin 2 (two) times daily. 5 units at lunch,  and 9 units after dinner. (Patient taking differently: Inject 5-15 Units into the skin 2 (two) times daily. 5-15 units as needed  (sliding scale insulin)), Disp: 10 mL, Rfl: 1   levothyroxine (SYNTHROID) 88 MCG tablet, Take 88 mcg by mouth daily., Disp: , Rfl:    meclizine (ANTIVERT) 25 MG tablet, Take 25 mg by mouth 3 (three) times daily as needed (for vertigo)., Disp: , Rfl:    metoprolol succinate (TOPROL-XL) 50 MG 24 hr tablet, TAKE 1 TABLET(50 MG) BY MOUTH DAILY WITH OR IMMEDIATELY FOLLOWING A MEAL (Patient taking differently: Take 50 mg by mouth in the morning and at bedtime.), Disp: 90 tablet, Rfl: 1   Multiple Vitamins-Minerals (CENTRUM SILVER) tablet, Take 1 tablet by mouth daily., Disp: , Rfl:    ONE TOUCH ULTRA TEST test strip, , Disp: , Rfl:    ranolazine (RANEXA) 500 MG 12 hr tablet, TAKE 1 TABLET(500 MG) BY MOUTH TWICE DAILY, Disp: 180 tablet, Rfl: 2   rosuvastatin (CRESTOR) 40 MG tablet, Take 40 mg by mouth at bedtime., Disp: , Rfl:    sacubitril-valsartan (ENTRESTO) 24-26 MG, Take 1 tablet by mouth 2 (two) times daily., Disp: , Rfl:    VASCEPA 1 g capsule, Take 2 capsules (2 g total) by mouth 2 (two) times daily., Disp: 360 capsule, Rfl: 0   isosorbide mononitrate (IMDUR) 30 MG 24 hr tablet, Take 1 tablet (30 mg total) by mouth daily., Disp: , Rfl:    nitroGLYCERIN (NITROSTAT) 0.4 MG SL tablet, Place 1 tablet (0.4 mg total) under the tongue every 5 (five) minutes as needed for up to 180 doses for chest pain., Disp: 90 tablet, Rfl: 1  Consent:   N/A  Disposition:   6 months sooner if needed Patient may be asked to follow-up sooner based on the results of the above-mentioned testing.  His questions and concerns were addressed to his satisfaction. He voices understanding of the recommendations provided during this encounter.    Signed, [name], DO, [facility]  [facility]  [address] [place], [place] [address] 10/24/2023 4:28 PM

## 2023-10-24 NOTE — Patient Instructions (Signed)
Medication Instructions:  Your physician recommends that you continue on your current medications as directed. Please refer to the Current Medication list given to you today.  *If you need a refill on your cardiac medications before your next appointment, please call your pharmacy*  Lab Work: With the lab work at your nephrology appointment tomorrow 12/10, you will get a FASTING lipid panel on behalf of Dr. Emelda Brothers office  If you have labs (blood work) drawn today and your tests are completely normal, you will receive your results only by: MyChart Message (if you have MyChart) OR A paper copy in the mail If you have any lab test that is abnormal or we need to change your treatment, we will call you to review the results.  Testing/Procedures: None ordered today.  Follow-Up: At [facility], you and your health needs are our priority.  As part of our continuing mission to provide you with exceptional heart care, we have created designated Provider Care Teams.  These Care Teams include your primary Cardiologist (physician) and Advanced Practice Providers (APPs -  Physician Assistants and Nurse Practitioners) who all work together to provide you with the care you need, when you need it.  We recommend signing up for the patient portal called "MyChart".  Sign up information is provided on this After Visit Summary.  MyChart is used to connect with patients for Virtual Visits (Telemedicine).  Patients are able to view lab/test results, encounter notes, upcoming appointments, etc.  Non-urgent messages can be sent to your provider as well.   To learn more about what you can do with MyChart, go to [email or web address].    Your next appointment:   6 month(s)  The format for your next appointment:   In Person  Provider:   [name], DO {

## 2023-10-25 ENCOUNTER — Other Ambulatory Visit: Payer: Self-pay | Admitting: Cardiology

## 2023-10-25 DIAGNOSIS — I2510 Atherosclerotic heart disease of native coronary artery without angina pectoris: Secondary | ICD-10-CM | POA: Diagnosis not present

## 2023-10-25 DIAGNOSIS — I129 Hypertensive chronic kidney disease with stage 1 through stage 4 chronic kidney disease, or unspecified chronic kidney disease: Secondary | ICD-10-CM | POA: Diagnosis not present

## 2023-10-25 DIAGNOSIS — E1122 Type 2 diabetes mellitus with diabetic chronic kidney disease: Secondary | ICD-10-CM | POA: Diagnosis not present

## 2023-10-25 DIAGNOSIS — I25118 Atherosclerotic heart disease of native coronary artery with other forms of angina pectoris: Secondary | ICD-10-CM

## 2023-10-25 DIAGNOSIS — D631 Anemia in chronic kidney disease: Secondary | ICD-10-CM | POA: Diagnosis not present

## 2023-10-25 DIAGNOSIS — I214 Non-ST elevation (NSTEMI) myocardial infarction: Secondary | ICD-10-CM | POA: Diagnosis not present

## 2023-10-25 DIAGNOSIS — E785 Hyperlipidemia, unspecified: Secondary | ICD-10-CM | POA: Diagnosis not present

## 2023-10-25 DIAGNOSIS — Z955 Presence of coronary angioplasty implant and graft: Secondary | ICD-10-CM

## 2023-10-25 DIAGNOSIS — I5022 Chronic systolic (congestive) heart failure: Secondary | ICD-10-CM | POA: Diagnosis not present

## 2023-10-25 DIAGNOSIS — N184 Chronic kidney disease, stage 4 (severe): Secondary | ICD-10-CM | POA: Diagnosis not present

## 2023-10-25 DIAGNOSIS — N179 Acute kidney failure, unspecified: Secondary | ICD-10-CM | POA: Diagnosis not present

## 2023-10-25 DIAGNOSIS — Z951 Presence of aortocoronary bypass graft: Secondary | ICD-10-CM

## 2023-10-26 DIAGNOSIS — I5022 Chronic systolic (congestive) heart failure: Secondary | ICD-10-CM | POA: Diagnosis not present

## 2023-10-26 DIAGNOSIS — E785 Hyperlipidemia, unspecified: Secondary | ICD-10-CM | POA: Diagnosis not present

## 2023-10-26 DIAGNOSIS — N184 Chronic kidney disease, stage 4 (severe): Secondary | ICD-10-CM | POA: Diagnosis not present

## 2023-10-26 DIAGNOSIS — N189 Chronic kidney disease, unspecified: Secondary | ICD-10-CM | POA: Diagnosis not present

## 2023-10-26 DIAGNOSIS — N390 Urinary tract infection, site not specified: Secondary | ICD-10-CM | POA: Diagnosis not present

## 2023-10-31 ENCOUNTER — Telehealth: Payer: Self-pay | Admitting: Cardiology

## 2023-10-31 NOTE — Telephone Encounter (Signed)
In the past he has had more CP consistent w/ stable angina when he gets "congested." IF the pain intensity , frequency, duration is back to baseline after lasix nothing more needs to be done.   Jesus Young [place], DO, [facility]

## 2023-10-31 NOTE — Telephone Encounter (Signed)
I called the pt and he reports that he had an "episode of angina".   Pt was seen 10/24/23 and at that time he was feeling well with no chest pain but he had about 6 brief episodes a few days ago at rest.  It was Saturday evening and he had ankle edema and some pain in his chest which in the past according to him happen together. He says it was mild.. he called his kidney MD and they told him to take Lasix 40mg  daily and the angina has improved.   He was concerned since this has not been happening for quite a ling time so he is worried that something has changed.   He has not had any more angina since Saturday, He says his kidney MD is planning labs but has not called him back to tell him when.

## 2023-10-31 NOTE — Telephone Encounter (Signed)
Pt states he had a episode of Angina and wanted to let the doctor know. Please advise

## 2023-11-10 DIAGNOSIS — N184 Chronic kidney disease, stage 4 (severe): Secondary | ICD-10-CM | POA: Diagnosis not present

## 2023-12-20 ENCOUNTER — Ambulatory Visit: Payer: Medicare Other | Admitting: Cardiology

## 2023-12-28 DIAGNOSIS — I129 Hypertensive chronic kidney disease with stage 1 through stage 4 chronic kidney disease, or unspecified chronic kidney disease: Secondary | ICD-10-CM | POA: Diagnosis not present

## 2023-12-28 DIAGNOSIS — E785 Hyperlipidemia, unspecified: Secondary | ICD-10-CM | POA: Diagnosis not present

## 2023-12-28 DIAGNOSIS — E1122 Type 2 diabetes mellitus with diabetic chronic kidney disease: Secondary | ICD-10-CM | POA: Diagnosis not present

## 2023-12-28 DIAGNOSIS — I5022 Chronic systolic (congestive) heart failure: Secondary | ICD-10-CM | POA: Diagnosis not present

## 2023-12-28 DIAGNOSIS — I214 Non-ST elevation (NSTEMI) myocardial infarction: Secondary | ICD-10-CM | POA: Diagnosis not present

## 2023-12-28 DIAGNOSIS — D631 Anemia in chronic kidney disease: Secondary | ICD-10-CM | POA: Diagnosis not present

## 2023-12-28 DIAGNOSIS — N184 Chronic kidney disease, stage 4 (severe): Secondary | ICD-10-CM | POA: Diagnosis not present

## 2023-12-28 DIAGNOSIS — I2510 Atherosclerotic heart disease of native coronary artery without angina pectoris: Secondary | ICD-10-CM | POA: Diagnosis not present

## 2023-12-28 DIAGNOSIS — N189 Chronic kidney disease, unspecified: Secondary | ICD-10-CM | POA: Diagnosis not present

## 2023-12-28 DIAGNOSIS — N179 Acute kidney failure, unspecified: Secondary | ICD-10-CM | POA: Diagnosis not present

## 2023-12-29 LAB — LAB REPORT - SCANNED
Calcium: 9.5
Creatinine, POC: 70.8 mg/dL
EGFR: 19

## 2023-12-30 DIAGNOSIS — M1731 Unilateral post-traumatic osteoarthritis, right knee: Secondary | ICD-10-CM | POA: Diagnosis not present

## 2023-12-30 DIAGNOSIS — M1732 Unilateral post-traumatic osteoarthritis, left knee: Secondary | ICD-10-CM | POA: Diagnosis not present

## 2023-12-30 DIAGNOSIS — M19031 Primary osteoarthritis, right wrist: Secondary | ICD-10-CM | POA: Diagnosis not present

## 2023-12-30 DIAGNOSIS — M4206 Juvenile osteochondrosis of spine, lumbar region: Secondary | ICD-10-CM | POA: Diagnosis not present

## 2023-12-30 DIAGNOSIS — L89104 Pressure ulcer of unspecified part of back, stage 4: Secondary | ICD-10-CM | POA: Diagnosis not present

## 2023-12-30 DIAGNOSIS — M19032 Primary osteoarthritis, left wrist: Secondary | ICD-10-CM | POA: Diagnosis not present

## 2024-01-03 ENCOUNTER — Encounter: Payer: Self-pay | Admitting: Podiatry

## 2024-01-03 ENCOUNTER — Ambulatory Visit: Payer: Medicare Other | Admitting: Podiatry

## 2024-01-03 DIAGNOSIS — K219 Gastro-esophageal reflux disease without esophagitis: Secondary | ICD-10-CM | POA: Insufficient documentation

## 2024-01-03 DIAGNOSIS — D2371 Other benign neoplasm of skin of right lower limb, including hip: Secondary | ICD-10-CM | POA: Diagnosis not present

## 2024-01-03 DIAGNOSIS — M7752 Other enthesopathy of left foot: Secondary | ICD-10-CM | POA: Diagnosis not present

## 2024-01-03 DIAGNOSIS — D2372 Other benign neoplasm of skin of left lower limb, including hip: Secondary | ICD-10-CM

## 2024-01-03 MED ORDER — DEXAMETHASONE SODIUM PHOSPHATE 120 MG/30ML IJ SOLN
2.0000 mg | Freq: Once | INTRAMUSCULAR | Status: AC
Start: 2024-01-03 — End: 2024-01-03
  Administered 2024-01-03: 2 mg via INTRA_ARTICULAR

## 2024-01-04 NOTE — Progress Notes (Signed)
 He presents today chief complaint of painful callus to the plantar aspect of his left foot.  He is also complaining of elongated toenails.  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  Foot is cool to the touch but no mottling.  Capillary fill time is immediate.  He does have soft tissue swelling subfifth metatarsal head of the left foot with an overlying benign reactive hyperkeratotic lesion.  Assessment: Pain in limb secondary to benign skin lesion and painful elongated nails.  Plan: Debridement of toenails 1 through 5 bilateral.  Ice injected the bursa today with 4 mg of Kenalog tolerated procedure well without complications.

## 2024-01-10 DIAGNOSIS — L0390 Cellulitis, unspecified: Secondary | ICD-10-CM | POA: Diagnosis not present

## 2024-01-11 DIAGNOSIS — M7582 Other shoulder lesions, left shoulder: Secondary | ICD-10-CM | POA: Diagnosis not present

## 2024-01-11 DIAGNOSIS — N184 Chronic kidney disease, stage 4 (severe): Secondary | ICD-10-CM | POA: Diagnosis not present

## 2024-01-11 DIAGNOSIS — S81802A Unspecified open wound, left lower leg, initial encounter: Secondary | ICD-10-CM | POA: Diagnosis not present

## 2024-01-11 DIAGNOSIS — M25512 Pain in left shoulder: Secondary | ICD-10-CM | POA: Diagnosis not present

## 2024-01-11 DIAGNOSIS — M7530 Calcific tendinitis of unspecified shoulder: Secondary | ICD-10-CM | POA: Diagnosis not present

## 2024-01-11 DIAGNOSIS — E1169 Type 2 diabetes mellitus with other specified complication: Secondary | ICD-10-CM | POA: Diagnosis not present

## 2024-01-12 DIAGNOSIS — I700 Atherosclerosis of aorta: Secondary | ICD-10-CM | POA: Diagnosis not present

## 2024-01-12 DIAGNOSIS — I130 Hypertensive heart and chronic kidney disease with heart failure and stage 1 through stage 4 chronic kidney disease, or unspecified chronic kidney disease: Secondary | ICD-10-CM | POA: Diagnosis not present

## 2024-01-12 DIAGNOSIS — J432 Centrilobular emphysema: Secondary | ICD-10-CM | POA: Diagnosis not present

## 2024-01-12 DIAGNOSIS — I5042 Chronic combined systolic (congestive) and diastolic (congestive) heart failure: Secondary | ICD-10-CM | POA: Diagnosis not present

## 2024-01-12 DIAGNOSIS — E782 Mixed hyperlipidemia: Secondary | ICD-10-CM | POA: Diagnosis not present

## 2024-01-12 DIAGNOSIS — E1121 Type 2 diabetes mellitus with diabetic nephropathy: Secondary | ICD-10-CM | POA: Diagnosis not present

## 2024-01-12 DIAGNOSIS — E039 Hypothyroidism, unspecified: Secondary | ICD-10-CM | POA: Diagnosis not present

## 2024-01-12 DIAGNOSIS — E1165 Type 2 diabetes mellitus with hyperglycemia: Secondary | ICD-10-CM | POA: Diagnosis not present

## 2024-01-12 DIAGNOSIS — I129 Hypertensive chronic kidney disease with stage 1 through stage 4 chronic kidney disease, or unspecified chronic kidney disease: Secondary | ICD-10-CM | POA: Diagnosis not present

## 2024-01-23 ENCOUNTER — Other Ambulatory Visit: Payer: Self-pay | Admitting: Internal Medicine

## 2024-01-23 DIAGNOSIS — R0989 Other specified symptoms and signs involving the circulatory and respiratory systems: Secondary | ICD-10-CM

## 2024-01-23 DIAGNOSIS — L97921 Non-pressure chronic ulcer of unspecified part of left lower leg limited to breakdown of skin: Secondary | ICD-10-CM | POA: Diagnosis not present

## 2024-02-01 DIAGNOSIS — L89104 Pressure ulcer of unspecified part of back, stage 4: Secondary | ICD-10-CM | POA: Diagnosis not present

## 2024-02-01 DIAGNOSIS — J432 Centrilobular emphysema: Secondary | ICD-10-CM | POA: Diagnosis not present

## 2024-02-01 DIAGNOSIS — M19032 Primary osteoarthritis, left wrist: Secondary | ICD-10-CM | POA: Diagnosis not present

## 2024-02-01 DIAGNOSIS — I700 Atherosclerosis of aorta: Secondary | ICD-10-CM | POA: Diagnosis not present

## 2024-02-01 DIAGNOSIS — E1121 Type 2 diabetes mellitus with diabetic nephropathy: Secondary | ICD-10-CM | POA: Diagnosis not present

## 2024-02-01 DIAGNOSIS — M1732 Unilateral post-traumatic osteoarthritis, left knee: Secondary | ICD-10-CM | POA: Diagnosis not present

## 2024-02-01 DIAGNOSIS — M4206 Juvenile osteochondrosis of spine, lumbar region: Secondary | ICD-10-CM | POA: Diagnosis not present

## 2024-02-01 DIAGNOSIS — I130 Hypertensive heart and chronic kidney disease with heart failure and stage 1 through stage 4 chronic kidney disease, or unspecified chronic kidney disease: Secondary | ICD-10-CM | POA: Diagnosis not present

## 2024-02-01 DIAGNOSIS — I5042 Chronic combined systolic (congestive) and diastolic (congestive) heart failure: Secondary | ICD-10-CM | POA: Diagnosis not present

## 2024-02-01 DIAGNOSIS — L97921 Non-pressure chronic ulcer of unspecified part of left lower leg limited to breakdown of skin: Secondary | ICD-10-CM | POA: Diagnosis not present

## 2024-02-01 DIAGNOSIS — M1731 Unilateral post-traumatic osteoarthritis, right knee: Secondary | ICD-10-CM | POA: Diagnosis not present

## 2024-02-01 DIAGNOSIS — M19031 Primary osteoarthritis, right wrist: Secondary | ICD-10-CM | POA: Diagnosis not present

## 2024-02-09 DIAGNOSIS — E11622 Type 2 diabetes mellitus with other skin ulcer: Secondary | ICD-10-CM | POA: Diagnosis not present

## 2024-02-09 DIAGNOSIS — L97822 Non-pressure chronic ulcer of other part of left lower leg with fat layer exposed: Secondary | ICD-10-CM | POA: Diagnosis not present

## 2024-02-09 DIAGNOSIS — I872 Venous insufficiency (chronic) (peripheral): Secondary | ICD-10-CM | POA: Diagnosis not present

## 2024-02-09 DIAGNOSIS — L97222 Non-pressure chronic ulcer of left calf with fat layer exposed: Secondary | ICD-10-CM | POA: Diagnosis not present

## 2024-02-10 DIAGNOSIS — S81802A Unspecified open wound, left lower leg, initial encounter: Secondary | ICD-10-CM | POA: Diagnosis not present

## 2024-02-15 DIAGNOSIS — I129 Hypertensive chronic kidney disease with stage 1 through stage 4 chronic kidney disease, or unspecified chronic kidney disease: Secondary | ICD-10-CM | POA: Diagnosis not present

## 2024-02-15 DIAGNOSIS — D751 Secondary polycythemia: Secondary | ICD-10-CM | POA: Diagnosis not present

## 2024-02-15 DIAGNOSIS — D692 Other nonthrombocytopenic purpura: Secondary | ICD-10-CM | POA: Diagnosis not present

## 2024-02-15 DIAGNOSIS — N1832 Chronic kidney disease, stage 3b: Secondary | ICD-10-CM | POA: Diagnosis not present

## 2024-02-15 DIAGNOSIS — Z0000 Encounter for general adult medical examination without abnormal findings: Secondary | ICD-10-CM | POA: Diagnosis not present

## 2024-02-15 DIAGNOSIS — E782 Mixed hyperlipidemia: Secondary | ICD-10-CM | POA: Diagnosis not present

## 2024-02-15 DIAGNOSIS — E1121 Type 2 diabetes mellitus with diabetic nephropathy: Secondary | ICD-10-CM | POA: Diagnosis not present

## 2024-02-16 DIAGNOSIS — E11622 Type 2 diabetes mellitus with other skin ulcer: Secondary | ICD-10-CM | POA: Diagnosis not present

## 2024-02-16 DIAGNOSIS — E785 Hyperlipidemia, unspecified: Secondary | ICD-10-CM | POA: Diagnosis not present

## 2024-02-16 DIAGNOSIS — I5020 Unspecified systolic (congestive) heart failure: Secondary | ICD-10-CM | POA: Diagnosis not present

## 2024-02-16 DIAGNOSIS — E1122 Type 2 diabetes mellitus with diabetic chronic kidney disease: Secondary | ICD-10-CM | POA: Diagnosis not present

## 2024-02-16 DIAGNOSIS — N189 Chronic kidney disease, unspecified: Secondary | ICD-10-CM | POA: Diagnosis not present

## 2024-02-16 DIAGNOSIS — D751 Secondary polycythemia: Secondary | ICD-10-CM | POA: Diagnosis not present

## 2024-02-16 DIAGNOSIS — T148XXA Other injury of unspecified body region, initial encounter: Secondary | ICD-10-CM | POA: Diagnosis not present

## 2024-02-16 DIAGNOSIS — L97822 Non-pressure chronic ulcer of other part of left lower leg with fat layer exposed: Secondary | ICD-10-CM | POA: Diagnosis not present

## 2024-02-16 DIAGNOSIS — L97222 Non-pressure chronic ulcer of left calf with fat layer exposed: Secondary | ICD-10-CM | POA: Diagnosis not present

## 2024-02-16 DIAGNOSIS — I872 Venous insufficiency (chronic) (peripheral): Secondary | ICD-10-CM | POA: Diagnosis not present

## 2024-02-16 DIAGNOSIS — I2510 Atherosclerotic heart disease of native coronary artery without angina pectoris: Secondary | ICD-10-CM | POA: Diagnosis not present

## 2024-02-16 DIAGNOSIS — Z794 Long term (current) use of insulin: Secondary | ICD-10-CM | POA: Diagnosis not present

## 2024-02-22 NOTE — Assessment & Plan Note (Signed)
 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 Allena Katz. Hospitalization 07/16/2023-07/17/2023: Unstable angina   Angina and Congestive Heart Failure Frequent angina episodes, up to 3-4 times a week

## 2024-02-23 ENCOUNTER — Other Ambulatory Visit: Payer: Self-pay

## 2024-02-23 ENCOUNTER — Inpatient Hospital Stay: Payer: Medicare Other | Attending: Hematology and Oncology

## 2024-02-23 ENCOUNTER — Inpatient Hospital Stay: Payer: Medicare Other | Admitting: Hematology and Oncology

## 2024-02-23 VITALS — BP 132/69 | HR 73 | Temp 98.3°F | Resp 18 | Ht 75.0 in | Wt 179.7 lb

## 2024-02-23 DIAGNOSIS — Z794 Long term (current) use of insulin: Secondary | ICD-10-CM | POA: Insufficient documentation

## 2024-02-23 DIAGNOSIS — N189 Chronic kidney disease, unspecified: Secondary | ICD-10-CM | POA: Insufficient documentation

## 2024-02-23 DIAGNOSIS — Z7982 Long term (current) use of aspirin: Secondary | ICD-10-CM | POA: Insufficient documentation

## 2024-02-23 DIAGNOSIS — E785 Hyperlipidemia, unspecified: Secondary | ICD-10-CM | POA: Diagnosis not present

## 2024-02-23 DIAGNOSIS — L97822 Non-pressure chronic ulcer of other part of left lower leg with fat layer exposed: Secondary | ICD-10-CM | POA: Diagnosis not present

## 2024-02-23 DIAGNOSIS — E1122 Type 2 diabetes mellitus with diabetic chronic kidney disease: Secondary | ICD-10-CM | POA: Diagnosis not present

## 2024-02-23 DIAGNOSIS — I2510 Atherosclerotic heart disease of native coronary artery without angina pectoris: Secondary | ICD-10-CM | POA: Diagnosis not present

## 2024-02-23 DIAGNOSIS — I509 Heart failure, unspecified: Secondary | ICD-10-CM | POA: Diagnosis not present

## 2024-02-23 DIAGNOSIS — Z7984 Long term (current) use of oral hypoglycemic drugs: Secondary | ICD-10-CM | POA: Insufficient documentation

## 2024-02-23 DIAGNOSIS — L97222 Non-pressure chronic ulcer of left calf with fat layer exposed: Secondary | ICD-10-CM | POA: Diagnosis not present

## 2024-02-23 DIAGNOSIS — Z79890 Hormone replacement therapy (postmenopausal): Secondary | ICD-10-CM | POA: Diagnosis not present

## 2024-02-23 DIAGNOSIS — I5020 Unspecified systolic (congestive) heart failure: Secondary | ICD-10-CM | POA: Diagnosis not present

## 2024-02-23 DIAGNOSIS — Z7902 Long term (current) use of antithrombotics/antiplatelets: Secondary | ICD-10-CM | POA: Diagnosis not present

## 2024-02-23 DIAGNOSIS — D751 Secondary polycythemia: Secondary | ICD-10-CM | POA: Diagnosis not present

## 2024-02-23 DIAGNOSIS — D45 Polycythemia vera: Secondary | ICD-10-CM | POA: Diagnosis not present

## 2024-02-23 DIAGNOSIS — Z79899 Other long term (current) drug therapy: Secondary | ICD-10-CM | POA: Diagnosis not present

## 2024-02-23 DIAGNOSIS — E11622 Type 2 diabetes mellitus with other skin ulcer: Secondary | ICD-10-CM | POA: Diagnosis not present

## 2024-02-23 DIAGNOSIS — I872 Venous insufficiency (chronic) (peripheral): Secondary | ICD-10-CM | POA: Diagnosis not present

## 2024-02-23 LAB — CBC WITH DIFFERENTIAL (CANCER CENTER ONLY)
Abs Immature Granulocytes: 0.01 10*3/uL (ref 0.00–0.07)
Basophils Absolute: 0 10*3/uL (ref 0.0–0.1)
Basophils Relative: 1 %
Eosinophils Absolute: 0.2 10*3/uL (ref 0.0–0.5)
Eosinophils Relative: 4 %
HCT: 45.2 % (ref 39.0–52.0)
Hemoglobin: 15 g/dL (ref 13.0–17.0)
Immature Granulocytes: 0 %
Lymphocytes Relative: 22 %
Lymphs Abs: 1 10*3/uL (ref 0.7–4.0)
MCH: 31.8 pg (ref 26.0–34.0)
MCHC: 33.2 g/dL (ref 30.0–36.0)
MCV: 95.8 fL (ref 80.0–100.0)
Monocytes Absolute: 0.5 10*3/uL (ref 0.1–1.0)
Monocytes Relative: 11 %
Neutro Abs: 2.8 10*3/uL (ref 1.7–7.7)
Neutrophils Relative %: 62 %
Platelet Count: 162 10*3/uL (ref 150–400)
RBC: 4.72 MIL/uL (ref 4.22–5.81)
RDW: 13.5 % (ref 11.5–15.5)
WBC Count: 4.4 10*3/uL (ref 4.0–10.5)
nRBC: 0 % (ref 0.0–0.2)

## 2024-02-23 NOTE — Progress Notes (Signed)
 Patient Care Team: Georgianne Fick, MD as PCP - General (Internal Medicine) [name], DO as PCP - Cardiology (Cardiology) [name], MD as Consulting Physician (Cardiology)  DIAGNOSIS:  Encounter Diagnosis  Name Primary?   Polycythemia vera (HCC) Yes   CHIEF COMPLIANT: Follow-up of polycythemia  HISTORY OF PRESENT ILLNESS: History of Present Illness The patient, with a history of polycythemia vera, and a history of congestive heart failure, kidney disease, and diabetes, presents with a chief complaint of lack of energy. He attributes this to his illnesses, the medications he is taking, and the changing seasons. He reports that it takes him about half an hour in the morning to feel ready to start the day, and often he would rather not. He also reports that his angina is not as bad as it used to be, and he has not had to take a fluid pill for over a month. He believes this is due to being less active during the winter months. He also reports that he has been turned down for blood donation due to heart issues.     ALLERGIES:  is allergic to tetanus antitoxin, clonazepam, and lisinopril.  MEDICATIONS:  Current Outpatient Medications  Medication Sig Dispense Refill   acetaminophen (TYLENOL) 650 MG CR tablet Take 650 mg by mouth every 8 (eight) hours as needed for pain.     AREXVY 120 MCG/0.5ML injection      aspirin EC 81 MG tablet Take 81 mg by mouth at bedtime.      B-D ULTRAFINE III SHORT PEN 31G X 8 MM MISC USE UTD  0   busPIRone (BUSPAR) 10 MG tablet Take 20 mg by mouth 2 (two) times daily.     Calcium Citrate-Vitamin D 315-5 MG-MCG TABS Take 1 tablet by mouth daily at 12 noon.     Docusate Sodium (GENTLE STOOL SOFTENER PO) Take 600 mg by mouth daily.     empagliflozin (JARDIANCE) 10 MG TABS tablet TAKE 1 TABLET(10 MG) BY MOUTH DAILY BEFORE BREAKFAST 90 tablet 3   famotidine (PEPCID) 20 MG tablet TAKE 1 TABLET(20 MG) BY MOUTH AT BEDTIME (Patient taking differently: Take 20  mg by mouth daily.) 90 tablet 1   HUMULIN N KWIKPEN 100 UNIT/ML KwikPen Inject into the skin.     insulin lispro (HUMALOG) 100 UNIT/ML injection Inject 0.05-0.09 mLs (5-9 Units total) into the skin 2 (two) times daily. 5 units at lunch, and 9 units after dinner. (Patient taking differently: Inject 5-15 Units into the skin 2 (two) times daily. 5-15 units as needed  (sliding scale insulin)) 10 mL 1   levothyroxine (SYNTHROID) 88 MCG tablet Take 88 mcg by mouth daily.     meclizine (ANTIVERT) 25 MG tablet Take 25 mg by mouth 3 (three) times daily as needed (for vertigo).     metoprolol succinate (TOPROL-XL) 50 MG 24 hr tablet TAKE 1 TABLET(50 MG) BY MOUTH DAILY WITH OR IMMEDIATELY FOLLOWING A MEAL (Patient taking differently: Take 50 mg by mouth in the morning and at bedtime.) 90 tablet 1   Multiple Vitamins-Minerals (CENTRUM SILVER) tablet Take 1 tablet by mouth daily.     nitroGLYCERIN (NITROSTAT) 0.4 MG SL tablet Place 1 tablet (0.4 mg total) under the tongue every 5 (five) minutes as needed for up to 180 doses for chest pain. 90 tablet 1   ONE TOUCH ULTRA TEST test strip      ranolazine (RANEXA) 500 MG 12 hr tablet TAKE 1 TABLET(500 MG) BY MOUTH TWICE DAILY 180 tablet  2   rosuvastatin (CRESTOR) 40 MG tablet Take 40 mg by mouth at bedtime.     sacubitril-valsartan (ENTRESTO) 24-26 MG Take 1 tablet by mouth 2 (two) times daily.     ticagrelor (BRILINTA) 90 MG TABS tablet TAKE 1 TABLET(90 MG) BY MOUTH TWICE DAILY 180 tablet 3   VASCEPA 1 g capsule TAKE 2 CAPSULES(2 GRAMS) BY MOUTH TWICE DAILY 360 capsule 3   furosemide (LASIX) 40 MG tablet Take 1 tablet (40 mg total) by mouth daily as needed for fluid (If weight goes up by 1 pound over 24 hours or 3 pounds over a week). TAKE 1 TABLET(40 MG) BY MOUTH DAILY     isosorbide mononitrate (IMDUR) 30 MG 24 hr tablet Take 1 tablet (30 mg total) by mouth daily.     No current facility-administered medications for this visit.    PHYSICAL EXAMINATION: ECOG  PERFORMANCE STATUS: 1 - Symptomatic but completely ambulatory  Vitals:   02/23/24 1053  BP: 132/69  Pulse: 73  Resp: 18  Temp: 98.3 F (36.8 C)  SpO2: 100%   Filed Weights   02/23/24 1053  Weight: 179 lb 11.2 oz (81.5 kg)      LABORATORY DATA:  I have reviewed the data as listed    Latest Ref Rng & Units 12/28/2023   12:00 AM 08/24/2023   10:38 AM 08/05/2023    2:29 PM  CMP  Glucose 70 - 99 mg/dL  161  096   BUN 8 - 23 mg/dL  50  38   Creatinine 0.45 - 1.24 mg/dL  4.09  8.11   Sodium 914 - 145 mmol/L  136  140   Potassium 3.5 - 5.1 mmol/L  5.0  4.9   Chloride 98 - 111 mmol/L  109  106   CO2 22 - 32 mmol/L  21  19   Calcium  9.5     9.3  9.6   Total Protein 6.5 - 8.1 g/dL  6.9    Total Bilirubin 0.3 - 1.2 mg/dL  0.5    Alkaline Phos 38 - 126 U/L  42    AST 15 - 41 U/L  17    ALT 0 - 44 U/L  19       This result is from an external source.    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  They discontinued it because of his heart issues.   Chronic kidney disease: Under the care of Dr. Allena Katz. Hospitalization 07/16/2023-07/17/2023: Unstable angina   Angina and Congestive Heart Failure Frequent angina episodes, up to 3-4 times a week  Since he stopped doing phlebotomy at [facility] we will plan to do phlebotomy every 6 months at the cancer  center. ------------------------------------- Assessment and Plan Assessment & Plan Polycythemia vera Managed with phlebotomy to maintain hematocrit at or below 45. Current labs show target hematocrit level. Mutation causes overproduction of blood cells, less active than others. Phlebotomy necessary due  to heart issues preventing blood donation. Risk of clotting increases if hematocrit exceeds 55. - Schedule phlebotomy to maintain hematocrit at or below 45. - Encourage hydration prior to phlebotomy. - Arrange lab work, consultation, and phlebotomy in six months.  Congestive Heart Failure Well-controlled with reduced need for diuretics indicating stable fluid status. Fatigue may relate to cardiac issues and medications.  Angina Symptoms decreased, possibly due to reduced physical activity during winter. Aware increased activity in warmer months may exacerbate symptoms.  Chronic Kidney Disease Well-managed with no recent exacerbations. Mindful of diuretics' impact on kidney function with reduced use reported.  Fatigue Significant fatigue possibly related to medical conditions and medications. Interested in options to improve energy levels. B12 supplementation discussed. - Consider trial of sublingual B12 lozenges to assess improvement in energy levels.      No orders of the defined types were placed in this encounter.  The patient has a good understanding of the overall plan. he agrees with it. he will call with any problems that may develop before the next visit here. Total time spent: 30 mins including face to face time and time spent for planning, charting and co-ordination of care   Tamsen Meek, MD 02/23/24

## 2024-02-24 ENCOUNTER — Other Ambulatory Visit: Payer: Self-pay | Admitting: Cardiology

## 2024-03-01 ENCOUNTER — Inpatient Hospital Stay

## 2024-03-01 VITALS — BP 113/65 | HR 67 | Resp 16

## 2024-03-01 DIAGNOSIS — Z7982 Long term (current) use of aspirin: Secondary | ICD-10-CM | POA: Diagnosis not present

## 2024-03-01 DIAGNOSIS — D45 Polycythemia vera: Secondary | ICD-10-CM

## 2024-03-01 DIAGNOSIS — Z7902 Long term (current) use of antithrombotics/antiplatelets: Secondary | ICD-10-CM | POA: Diagnosis not present

## 2024-03-01 DIAGNOSIS — Z7984 Long term (current) use of oral hypoglycemic drugs: Secondary | ICD-10-CM | POA: Diagnosis not present

## 2024-03-01 DIAGNOSIS — Z79899 Other long term (current) drug therapy: Secondary | ICD-10-CM | POA: Diagnosis not present

## 2024-03-01 DIAGNOSIS — Z794 Long term (current) use of insulin: Secondary | ICD-10-CM | POA: Diagnosis not present

## 2024-03-01 DIAGNOSIS — I509 Heart failure, unspecified: Secondary | ICD-10-CM | POA: Diagnosis not present

## 2024-03-01 DIAGNOSIS — E1122 Type 2 diabetes mellitus with diabetic chronic kidney disease: Secondary | ICD-10-CM | POA: Diagnosis not present

## 2024-03-01 DIAGNOSIS — N189 Chronic kidney disease, unspecified: Secondary | ICD-10-CM | POA: Diagnosis not present

## 2024-03-01 NOTE — Patient Instructions (Signed)

## 2024-03-01 NOTE — Progress Notes (Signed)
 Jesus Young presents today for phlebotomy per MD orders. Phlebotomy procedure started at 1436 and ended at 1441. 547 grams removed from right [facility] with phlebotomy kit.  Patient observed for 30 minutes after procedure without any incident. Patient tolerated procedure well. IV needle removed intact.

## 2024-03-08 ENCOUNTER — Encounter (HOSPITAL_BASED_OUTPATIENT_CLINIC_OR_DEPARTMENT_OTHER): Admitting: General Surgery

## 2024-03-08 DIAGNOSIS — I2510 Atherosclerotic heart disease of native coronary artery without angina pectoris: Secondary | ICD-10-CM | POA: Diagnosis not present

## 2024-03-08 DIAGNOSIS — L97822 Non-pressure chronic ulcer of other part of left lower leg with fat layer exposed: Secondary | ICD-10-CM | POA: Diagnosis not present

## 2024-03-08 DIAGNOSIS — N189 Chronic kidney disease, unspecified: Secondary | ICD-10-CM | POA: Diagnosis not present

## 2024-03-08 DIAGNOSIS — D751 Secondary polycythemia: Secondary | ICD-10-CM | POA: Diagnosis not present

## 2024-03-08 DIAGNOSIS — I872 Venous insufficiency (chronic) (peripheral): Secondary | ICD-10-CM | POA: Diagnosis not present

## 2024-03-08 DIAGNOSIS — I5020 Unspecified systolic (congestive) heart failure: Secondary | ICD-10-CM | POA: Diagnosis not present

## 2024-03-08 DIAGNOSIS — E785 Hyperlipidemia, unspecified: Secondary | ICD-10-CM | POA: Diagnosis not present

## 2024-03-08 DIAGNOSIS — Z794 Long term (current) use of insulin: Secondary | ICD-10-CM | POA: Diagnosis not present

## 2024-03-08 DIAGNOSIS — E1122 Type 2 diabetes mellitus with diabetic chronic kidney disease: Secondary | ICD-10-CM | POA: Diagnosis not present

## 2024-03-08 DIAGNOSIS — L97222 Non-pressure chronic ulcer of left calf with fat layer exposed: Secondary | ICD-10-CM | POA: Diagnosis not present

## 2024-03-08 DIAGNOSIS — E11622 Type 2 diabetes mellitus with other skin ulcer: Secondary | ICD-10-CM | POA: Diagnosis not present

## 2024-03-13 DIAGNOSIS — I2510 Atherosclerotic heart disease of native coronary artery without angina pectoris: Secondary | ICD-10-CM | POA: Diagnosis not present

## 2024-03-13 DIAGNOSIS — I214 Non-ST elevation (NSTEMI) myocardial infarction: Secondary | ICD-10-CM | POA: Diagnosis not present

## 2024-03-13 DIAGNOSIS — I5022 Chronic systolic (congestive) heart failure: Secondary | ICD-10-CM | POA: Diagnosis not present

## 2024-03-13 DIAGNOSIS — E785 Hyperlipidemia, unspecified: Secondary | ICD-10-CM | POA: Diagnosis not present

## 2024-03-13 DIAGNOSIS — D631 Anemia in chronic kidney disease: Secondary | ICD-10-CM | POA: Diagnosis not present

## 2024-03-13 DIAGNOSIS — N184 Chronic kidney disease, stage 4 (severe): Secondary | ICD-10-CM | POA: Diagnosis not present

## 2024-03-13 DIAGNOSIS — I129 Hypertensive chronic kidney disease with stage 1 through stage 4 chronic kidney disease, or unspecified chronic kidney disease: Secondary | ICD-10-CM | POA: Diagnosis not present

## 2024-03-13 DIAGNOSIS — E1122 Type 2 diabetes mellitus with diabetic chronic kidney disease: Secondary | ICD-10-CM | POA: Diagnosis not present

## 2024-03-13 DIAGNOSIS — N179 Acute kidney failure, unspecified: Secondary | ICD-10-CM | POA: Diagnosis not present

## 2024-03-14 LAB — LAB REPORT - SCANNED
Creatinine, POC: 77.1 mg/dL
EGFR: 16

## 2024-03-22 DIAGNOSIS — L97222 Non-pressure chronic ulcer of left calf with fat layer exposed: Secondary | ICD-10-CM | POA: Diagnosis not present

## 2024-03-22 DIAGNOSIS — E1122 Type 2 diabetes mellitus with diabetic chronic kidney disease: Secondary | ICD-10-CM | POA: Diagnosis not present

## 2024-03-22 DIAGNOSIS — I2510 Atherosclerotic heart disease of native coronary artery without angina pectoris: Secondary | ICD-10-CM | POA: Diagnosis not present

## 2024-03-22 DIAGNOSIS — L989 Disorder of the skin and subcutaneous tissue, unspecified: Secondary | ICD-10-CM | POA: Diagnosis not present

## 2024-03-22 DIAGNOSIS — Z794 Long term (current) use of insulin: Secondary | ICD-10-CM | POA: Diagnosis not present

## 2024-03-22 DIAGNOSIS — Z79899 Other long term (current) drug therapy: Secondary | ICD-10-CM | POA: Diagnosis not present

## 2024-03-22 DIAGNOSIS — I252 Old myocardial infarction: Secondary | ICD-10-CM | POA: Diagnosis not present

## 2024-03-22 DIAGNOSIS — I87312 Chronic venous hypertension (idiopathic) with ulcer of left lower extremity: Secondary | ICD-10-CM | POA: Diagnosis not present

## 2024-03-22 DIAGNOSIS — N189 Chronic kidney disease, unspecified: Secondary | ICD-10-CM | POA: Diagnosis not present

## 2024-03-22 DIAGNOSIS — I5020 Unspecified systolic (congestive) heart failure: Secondary | ICD-10-CM | POA: Diagnosis not present

## 2024-03-22 DIAGNOSIS — I872 Venous insufficiency (chronic) (peripheral): Secondary | ICD-10-CM | POA: Diagnosis not present

## 2024-03-22 DIAGNOSIS — E785 Hyperlipidemia, unspecified: Secondary | ICD-10-CM | POA: Diagnosis not present

## 2024-03-23 DIAGNOSIS — S81802A Unspecified open wound, left lower leg, initial encounter: Secondary | ICD-10-CM | POA: Diagnosis not present

## 2024-03-26 ENCOUNTER — Other Ambulatory Visit: Payer: Self-pay

## 2024-03-26 MED ORDER — METOPROLOL SUCCINATE ER 50 MG PO TB24: 50.0000 mg | ORAL_TABLET | Freq: Two times a day (BID) | ORAL | 3 refills | Status: DC

## 2024-03-27 DIAGNOSIS — N184 Chronic kidney disease, stage 4 (severe): Secondary | ICD-10-CM | POA: Diagnosis not present

## 2024-03-27 DIAGNOSIS — I209 Angina pectoris, unspecified: Secondary | ICD-10-CM | POA: Diagnosis not present

## 2024-03-27 DIAGNOSIS — I5042 Chronic combined systolic (congestive) and diastolic (congestive) heart failure: Secondary | ICD-10-CM | POA: Diagnosis not present

## 2024-03-27 DIAGNOSIS — R0609 Other forms of dyspnea: Secondary | ICD-10-CM | POA: Diagnosis not present

## 2024-03-27 DIAGNOSIS — I130 Hypertensive heart and chronic kidney disease with heart failure and stage 1 through stage 4 chronic kidney disease, or unspecified chronic kidney disease: Secondary | ICD-10-CM | POA: Diagnosis not present

## 2024-04-02 DIAGNOSIS — I252 Old myocardial infarction: Secondary | ICD-10-CM | POA: Diagnosis not present

## 2024-04-02 DIAGNOSIS — E785 Hyperlipidemia, unspecified: Secondary | ICD-10-CM | POA: Diagnosis not present

## 2024-04-02 DIAGNOSIS — L989 Disorder of the skin and subcutaneous tissue, unspecified: Secondary | ICD-10-CM | POA: Diagnosis not present

## 2024-04-02 DIAGNOSIS — Z794 Long term (current) use of insulin: Secondary | ICD-10-CM | POA: Diagnosis not present

## 2024-04-02 DIAGNOSIS — E1122 Type 2 diabetes mellitus with diabetic chronic kidney disease: Secondary | ICD-10-CM | POA: Diagnosis not present

## 2024-04-02 DIAGNOSIS — Z79899 Other long term (current) drug therapy: Secondary | ICD-10-CM | POA: Diagnosis not present

## 2024-04-02 DIAGNOSIS — N189 Chronic kidney disease, unspecified: Secondary | ICD-10-CM | POA: Diagnosis not present

## 2024-04-02 DIAGNOSIS — L97222 Non-pressure chronic ulcer of left calf with fat layer exposed: Secondary | ICD-10-CM | POA: Diagnosis not present

## 2024-04-02 DIAGNOSIS — I2510 Atherosclerotic heart disease of native coronary artery without angina pectoris: Secondary | ICD-10-CM | POA: Diagnosis not present

## 2024-04-02 DIAGNOSIS — I87312 Chronic venous hypertension (idiopathic) with ulcer of left lower extremity: Secondary | ICD-10-CM | POA: Diagnosis not present

## 2024-04-02 DIAGNOSIS — I872 Venous insufficiency (chronic) (peripheral): Secondary | ICD-10-CM | POA: Diagnosis not present

## 2024-04-02 DIAGNOSIS — I5020 Unspecified systolic (congestive) heart failure: Secondary | ICD-10-CM | POA: Diagnosis not present

## 2024-04-03 ENCOUNTER — Ambulatory Visit: Payer: Medicare Other | Admitting: Podiatry

## 2024-04-03 ENCOUNTER — Encounter: Payer: Self-pay | Admitting: Podiatry

## 2024-04-03 DIAGNOSIS — N184 Chronic kidney disease, stage 4 (severe): Secondary | ICD-10-CM | POA: Diagnosis not present

## 2024-04-03 DIAGNOSIS — D2371 Other benign neoplasm of skin of right lower limb, including hip: Secondary | ICD-10-CM | POA: Diagnosis not present

## 2024-04-03 DIAGNOSIS — B351 Tinea unguium: Secondary | ICD-10-CM

## 2024-04-03 DIAGNOSIS — M7752 Other enthesopathy of left foot: Secondary | ICD-10-CM | POA: Diagnosis not present

## 2024-04-03 DIAGNOSIS — D689 Coagulation defect, unspecified: Secondary | ICD-10-CM | POA: Diagnosis not present

## 2024-04-03 DIAGNOSIS — S81802A Unspecified open wound, left lower leg, initial encounter: Secondary | ICD-10-CM | POA: Diagnosis not present

## 2024-04-03 DIAGNOSIS — M79676 Pain in unspecified toe(s): Secondary | ICD-10-CM | POA: Diagnosis not present

## 2024-04-03 DIAGNOSIS — M7751 Other enthesopathy of right foot: Secondary | ICD-10-CM

## 2024-04-03 DIAGNOSIS — D2372 Other benign neoplasm of skin of left lower limb, including hip: Secondary | ICD-10-CM | POA: Diagnosis not present

## 2024-04-03 DIAGNOSIS — E119 Type 2 diabetes mellitus without complications: Secondary | ICD-10-CM

## 2024-04-03 MED ORDER — DEXAMETHASONE SODIUM PHOSPHATE 120 MG/30ML IJ SOLN
4.0000 mg | Freq: Once | INTRAMUSCULAR | Status: AC
  Administered 2024-04-03: 4 mg via INTRA_ARTICULAR

## 2024-04-03 NOTE — Progress Notes (Signed)
 He presents today chief complaint of painful callus to the plantar aspect of his left foot.  He is also complaining of elongated toenails.  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  Foot is cool to the touch but no mottling.  Capillary fill time is immediate.  He does have soft tissue swelling subfifth metatarsal head of the left foot with an overlying benign reactive hyperkeratotic lesion.  Assessment: Pain in limb secondary to benign skin lesion and painful elongated nails.  Plan: Debridement of toenails 1 through 5 bilateral.  I injected the bursa today with 4 mg of Kenalog  tolerated procedure well without complications.  These were injected beneath the fifth metatarsal head bilaterally.

## 2024-04-04 DIAGNOSIS — S81802A Unspecified open wound, left lower leg, initial encounter: Secondary | ICD-10-CM | POA: Diagnosis not present

## 2024-04-16 DIAGNOSIS — Z794 Long term (current) use of insulin: Secondary | ICD-10-CM | POA: Diagnosis not present

## 2024-04-16 DIAGNOSIS — L97821 Non-pressure chronic ulcer of other part of left lower leg limited to breakdown of skin: Secondary | ICD-10-CM | POA: Diagnosis not present

## 2024-04-16 DIAGNOSIS — I872 Venous insufficiency (chronic) (peripheral): Secondary | ICD-10-CM | POA: Diagnosis not present

## 2024-04-16 DIAGNOSIS — I5020 Unspecified systolic (congestive) heart failure: Secondary | ICD-10-CM | POA: Diagnosis not present

## 2024-04-16 DIAGNOSIS — L97222 Non-pressure chronic ulcer of left calf with fat layer exposed: Secondary | ICD-10-CM | POA: Diagnosis not present

## 2024-04-16 DIAGNOSIS — N189 Chronic kidney disease, unspecified: Secondary | ICD-10-CM | POA: Diagnosis not present

## 2024-04-16 DIAGNOSIS — E11622 Type 2 diabetes mellitus with other skin ulcer: Secondary | ICD-10-CM | POA: Diagnosis not present

## 2024-04-16 DIAGNOSIS — E1122 Type 2 diabetes mellitus with diabetic chronic kidney disease: Secondary | ICD-10-CM | POA: Diagnosis not present

## 2024-04-23 ENCOUNTER — Ambulatory Visit: Payer: Medicare Other | Admitting: Cardiology

## 2024-04-26 ENCOUNTER — Ambulatory Visit: Payer: Medicare Other | Attending: Cardiology | Admitting: Cardiology

## 2024-04-26 ENCOUNTER — Encounter: Payer: Self-pay | Admitting: Cardiology

## 2024-04-26 VITALS — BP 116/60 | HR 70 | Resp 16 | Ht 75.0 in | Wt 175.2 lb

## 2024-04-26 DIAGNOSIS — E119 Type 2 diabetes mellitus without complications: Secondary | ICD-10-CM

## 2024-04-26 DIAGNOSIS — I25118 Atherosclerotic heart disease of native coronary artery with other forms of angina pectoris: Secondary | ICD-10-CM

## 2024-04-26 DIAGNOSIS — I130 Hypertensive heart and chronic kidney disease with heart failure and stage 1 through stage 4 chronic kidney disease, or unspecified chronic kidney disease: Secondary | ICD-10-CM | POA: Diagnosis not present

## 2024-04-26 DIAGNOSIS — Z955 Presence of coronary angioplasty implant and graft: Secondary | ICD-10-CM

## 2024-04-26 DIAGNOSIS — E781 Pure hyperglyceridemia: Secondary | ICD-10-CM | POA: Diagnosis not present

## 2024-04-26 DIAGNOSIS — E782 Mixed hyperlipidemia: Secondary | ICD-10-CM

## 2024-04-26 DIAGNOSIS — I255 Ischemic cardiomyopathy: Secondary | ICD-10-CM

## 2024-04-26 DIAGNOSIS — Z794 Long term (current) use of insulin: Secondary | ICD-10-CM | POA: Diagnosis not present

## 2024-04-26 DIAGNOSIS — Z951 Presence of aortocoronary bypass graft: Secondary | ICD-10-CM | POA: Diagnosis not present

## 2024-04-26 DIAGNOSIS — I447 Left bundle-branch block, unspecified: Secondary | ICD-10-CM

## 2024-04-26 NOTE — Progress Notes (Signed)
 Cardiology Office Note:  .    ID:  Jesus Young, DOB Dec 10, 1945, MRN [id number] PCP:  Virgle Grime, MD  Former Cardiology Providers: NA [facility] Providers Cardiologist:  Olinda Bertrand, DO , [facility] (established care 08/2022), Dr. Collette Dayhoff at [facility]  Electrophysiologist:  None  Click to update primary MD,subspecialty MD or APP then REFRESH:1}    Chief Complaint  Patient presents with   Coronary artery disease of native artery of native heart wi   Follow-up    History of Present Illness: .   Jesus Young is a 78 y.o. Caucasian male whose past medical history and cardiovascular risk factors includes: CAD status post CABG in 2012, LBBB, HTN, IDDM, hyperlipidemia, chronic kidney disease, polycythemia, history of COVID-19 infection, advanced age.   Known history of CAD status post CABG in 2012.  His initial NSTEMI was in October 2022 when he underwent PCI to the SVG to RCA graft.  He was treated medically until his second NSTEMI in October 2023.  Echocardiogram illustrated newly reduced LVEF and we discussed early invasive angiography versus medical management but given his progressive chronic kidney disease patient preferred medical management as contrast exposure could hasten worsening CKD leading to end-stage renal disease.   Despite up titration of medical therapy he had anginal symptoms and therefore referred to [facility] for evaluation of EECP.  He established care with Dr. Collette Dayhoff and has been under his care.  He too has been focusing on medical therapy given his renal function.  Patient states that he was not considered a candidate for EECP.  He has known history of stable angina and required sublingual nitroglycerin  tablets regularly.  He presented to the ED several months ago with chest pain consistent with unstable angina.  Despite high risk for developing acute kidney injury needing hemodialysis shared decision at that time was to proceed with left heart  catheterization.  His SVG to RCA stent was patent but disease in the PLB felt to be the culprit.  His SVG to OM1 branch was patent as well.  Minimal contrast was used and his renal function was preserved.  Patient was last in the office in December 2024 at that time patient continued to have stable angina requiring 1 to 2 tablets of sublingual nitroglycerin  tablets.  Home weight at that time was 170 pounds.  Patient was recommended to use Lasix  on as needed basis.  He presents today for 48-month follow-up visit.  Patient presents today for 13-month follow-up visit. Has chronic stable angina without increase in intensity frequency or duration. Patient states  I think my heart is worsening but chest pain is in good control. He is averaging 1 tablet of sublingual nitroglycerin  predominantly at night. Yesterday he was working in the yard was able to accomplish what he had intended to do.  However with exertion he did experience shortness of breath which dissipated after resting and relaxing. He still continues to take Lasix  3 times a week for volume management. Also saw Dr. Collette Dayhoff at [facility] about a month ago and had labs done at that time.  Creatinine was 4.1 mg/dL, GFR 14, NT-proBNP 4098.  No active chest pain or heart failure symptoms  Review of Systems: .   Review of Systems  Cardiovascular:  Positive for chest pain (Stable). Negative for claudication, irregular heartbeat, leg swelling, near-syncope, orthopnea, palpitations, paroxysmal nocturnal dyspnea and syncope.  Respiratory:  Negative for shortness of breath.   Hematologic/Lymphatic: Negative for bleeding problem.    Studies Reviewed:  EKG: EKG Interpretation Date/Time:  Thursday April 26 2024 13:43:48 EDT Ventricular Rate:  70 PR Interval:  194 QRS Duration:  160 QT Interval:  442 QTC Calculation: 477 R Axis:   157  Text Interpretation: Normal sinus rhythm Right axis deviation Left bundle branch block When compared with ECG  of 17-Jul-2023 10:49, T wave inversion now evident in Inferior leads but similar findings noted in October 2023 Confirmed by Olinda Bertrand [phone]) on 04/28/2024 3:43:35 PM  Echocardiogram: 07/17/2023  1. Left ventricular ejection fraction, by estimation, is 30 to 35%. The  left ventricle has moderately decreased function. The left ventricle  demonstrates global hypokinesis. The left ventricular internal cavity size  was dilated. There is mild left  ventricular hypertrophy. Left ventricular diastolic parameters are  consistent with Grade I diastolic dysfunction (impaired relaxation). The  anterior wall, anterior septum, inferior wall, mid inferoseptal segment,  and basal inferoseptal segment are  hypokinetic.   2. Right ventricular systolic function reduced (RV S' 8.9cm/s TAPSE  11mm). The right ventricular size is normal. Tricuspid regurgitation  signal is inadequate for assessing PA pressure.   3. The mitral valve is grossly normal. Trivial mitral valve  regurgitation. No evidence of mitral stenosis.   4. The aortic valve is tricuspid. Aortic valve regurgitation is not  visualized. No aortic stenosis is present.   5. The inferior vena cava is normal in size with greater than 50%  respiratory variability, suggesting right atrial pressure of 3 mmHg.   Comparison(s): Prior study 08/27/2022: LVEF 25-30% with RWMA, LV size  dilated, Grade I diastolic dyfunction, RV function mildly reduced, RV size  normal, small pericardial effusion, mild MR, estimated RAP .   Heart Catheterization: Left Heart Catheterization 07/16/23:  Hemodynamic data:  LV 153/15, EDP 24 mmHg.  Ao 140/68, mean 100 mmHg.  No pressure gradient across the aortic valve.   Angiographic data:  Native arteries were not visualized as RCA and LAD and Cx are known occluded vessels.  LIMA to LAD not visualized as it is presumed to be patent. SVG to RCA: Widely patent 4.0 x 16 mm Synergy XD stent in the proximal segment placed  on 08/21/2021, a moderate-sized PL branch previously 80 to 90% now is 99% stenosed with ulceration, culprit lesion. SVG to OM1: Widely patent.    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  However patient's creatinine had risen up to 4 mg, recently Entresto  has been discontinued and stat serum creatinine was around 2.3-2.4, as the vessel is only small, do not think this is: Make a major impact with regard to mortality benefit.  Patient also extremely concerned and initially did not want to proceed with angiography as well.  I have aborted any angioplasty as the vessel is diffusely diseased.  Will treat him medically with anticoagulation with heparin , patient is presently on Brilinta  and aspirin , continue the same for now.  We could consider changing from Brilinta  to Xarelto 2.5 mg twice daily along with aspirin  81 mg daily to see whether his symptoms would improve empirically. 7 mL contrast utilized.  RADIOLOGY: N/A  Risk Assessment/Calculations:   NA   Labs:       Latest Ref Rng & Units 02/23/2024   10:36 AM 08/24/2023   10:38 AM 07/17/2023    2:25 AM  CBC  WBC 4.0 - 10.5 K/uL 4.4  6.2  5.1   Hemoglobin 13.0 - 17.0 g/dL 09.8  11.9  12.5  Hematocrit 39.0 - 52.0 % 45.2  46.8  39.5   Platelets 150 - 400 K/uL 162  166  162        Latest Ref Rng & Units 12/28/2023   12:00 AM 08/24/2023   10:38 AM 08/05/2023    2:29 PM  BMP  Glucose 70 - 99 mg/dL  161  096   BUN 8 - 23 mg/dL  50  38   Creatinine 0.45 - 1.24 mg/dL  4.09  8.11   BUN/Creat Ratio 10 - 24   14   Sodium 135 - 145 mmol/L  136  140   Potassium 3.5 - 5.1 mmol/L  5.0  4.9   Chloride 98 - 111 mmol/L  109  106   CO2 22 - 32 mmol/L  21  19   Calcium   9.5     9.3  9.6      This result is from an external source.      Latest Ref Rng & Units 12/28/2023   12:00 AM 08/24/2023   10:38 AM 08/05/2023    2:29 PM  CMP  Glucose 70 - 99 mg/dL  914  782    BUN 8 - 23 mg/dL  50  38   Creatinine 9.56 - 1.24 mg/dL  2.13  0.86   Sodium 578 - 145 mmol/L  136  140   Potassium 3.5 - 5.1 mmol/L  5.0  4.9   Chloride 98 - 111 mmol/L  109  106   CO2 22 - 32 mmol/L  21  19   Calcium   9.5     9.3  9.6   Total Protein 6.5 - 8.1 g/dL  6.9    Total Bilirubin 0.3 - 1.2 mg/dL  0.5    Alkaline Phos 38 - 126 U/L  42    AST 15 - 41 U/L  17    ALT 0 - 44 U/L  19       This result is from an external source.    Lab Results  Component Value Date   CHOL 116 07/20/2023   HDL 37 (L) 07/20/2023   LDLCALC 52 07/20/2023   LDLDIRECT 48 07/20/2023   TRIG 155 (H) 07/20/2023   CHOLHDL 3.6 07/17/2023   Recent Labs    07/17/23 0225  LIPOA <8.4   No components found for: NTPROBNP Recent Labs    08/05/23 1429  PROBNP 8,865*   No results for input(s): TSH in the last 8760 hours.  Physical Exam:    Today's Vitals   04/26/24 1340  BP: 116/60  Pulse: 70  Resp: 16  SpO2: 96%  Weight: 175 lb 3.2 oz (79.5 kg)  Height: 6' 3 (1.905 m)   Body mass index is 21.9 kg/m. Wt Readings from Last 3 Encounters:  04/26/24 175 lb 3.2 oz (79.5 kg)  02/23/24 179 lb 11.2 oz (81.5 kg)  10/24/23 174 lb (78.9 kg)    Physical Exam  Constitutional: No distress.  Age appropriate, hemodynamically stable.   Neck: No JVD present.  Cardiovascular: Normal rate, regular rhythm, S1 normal, S2 normal, intact distal pulses and normal pulses. Exam reveals no gallop, no S3 and no S4.  No murmur heard. Pulses:      Dorsalis pedis pulses are 2+ on the right side and 2+ on the left side.       Posterior tibial pulses are 2+ on the right side and 2+ on the left side.  Pulmonary/Chest: Effort normal and breath sounds normal. No  stridor. He has no wheezes. He has no rales.  Abdominal: Soft. Bowel sounds are normal. He exhibits no distension. There is no abdominal tenderness.  Musculoskeletal:        General: No edema.     Cervical back: Neck supple.  Neurological: He is alert  and oriented to person, place, and time. He has intact cranial nerves (2-12).  Skin: Skin is warm and moist.   Impression & Recommendation(s):  Impression:   ICD-10-CM   1. Coronary artery disease of native artery of native heart with stable angina pectoris (HCC)  I25.118 EKG 12-Lead    2. Ischemic cardiomyopathy  I25.5     3. Status post insertion of drug eluting coronary artery stent  Z95.5     4. Hx of CABG  Z95.1     5. LBBB (left bundle branch block)  I44.7     6. Mixed hyperlipidemia  E78.2     7. Pure hypertriglyceridemia  E78.1     8. 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 Insulin  dependent type 2 diabetes mellitus (HCC)  E11.9    Z79.4        Recommendation(s):  Coronary artery disease of native artery of native heart with stable angina pectoris (HCC) Ischemic cardiomyopathy Status post insertion of drug eluting coronary artery stent Hx of CABG Known history of stable angina requiring 1 to 2 tablets of sublingual nitroglycerin , since the last visit only one tab daily at night No ER or hospitalizations for chest pain, ACS, heart failure Routine EKG illustrates sinus rhythm with T WI in the inferior leads which are new when compared to last tracing.  However he has had similar EKG changes back in October 2023.  EKG was reviewed with the patient in detail at today's office visit. Since he is not having active chest pain and his overall symptoms are relatively at baseline we will hold off on invasive angiography given his advancing chronic kidney disease stage IV/V. That being said, patient is still encouraged to seek medical attention by going to the closest ER via EMS if he were to have crescendo chest pain for more immediate attention. He also is under the care of Dr. Collette Dayhoff at [facility], who he saw in May 2025, records from Care Everywhere reviewed. Antianginal therapies include: Metoprolol , Ranexa , Imdur , and nitro  tablets Lipid-lowering agents: Vascepa , Crestor  Nitroglycerin  tablets refilled.  Mixed hyperlipidemia Hypertriglyceridemia Currently on Crestor  40 mg p.o. daily, Vascepa  2 g twice daily.   He denies myalgia or other side effects.  Chronic HFrEF (heart failure with reduced ejection fraction) (HCC) Stage C, NYHA class II 08/2022: LVEF 25-30%. 07/2023: LVEF 30-35%. Uptitration of GDMT has been difficult due to CKD stage IV/V Continue Jardiance  10 mg p.o. daily. Continue Lasix  40 mg p.o. every Monday Wednesday and Friday. Continue Imdur  30 mg p.o. daily. Continue Toprol -XL 50 mg p.o. twice daily. Continue Entresto  24/26 mg p.o. twice daily . Spironolactone  25 mg p.o. daily-stopped thought due to low EGFR and hyperkalemia Home blood pressures are very well-controlled. 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   There are no discontinued medications.    Current Outpatient Medications:    acetaminophen  (TYLENOL ) 650 MG CR tablet, Take 650 mg by mouth every 8 (eight) hours as needed for pain., Disp: , Rfl:    AREXVY 120 MCG/0.5ML injection, , Disp: , Rfl:    aspirin  EC 81 MG tablet, Take 81 mg by mouth at bedtime. , Disp: , Rfl:    B-D ULTRAFINE III SHORT PEN 31G X 8 MM MISC, USE UTD, Disp: , Rfl: 0   busPIRone  (BUSPAR ) 10 MG tablet, Take 20 mg by mouth 2 (two) times daily., Disp: , Rfl:    Calcium  Citrate-Vitamin D  315-5 MG-MCG TABS, Take 1 tablet by mouth daily at 12 noon., Disp: , Rfl:    Docusate Sodium  (GENTLE STOOL SOFTENER PO), Take 600 mg by mouth daily., Disp: ,  Rfl:    empagliflozin  (JARDIANCE ) 10 MG TABS tablet, TAKE 1 TABLET(10 MG) BY MOUTH DAILY BEFORE BREAKFAST, Disp: 90 tablet, Rfl: 3   famotidine  (PEPCID ) 20 MG tablet, TAKE 1 TABLET(20 MG) BY MOUTH AT BEDTIME, Disp: 90 tablet, Rfl: 1   furosemide  (LASIX ) 40 MG tablet, Take 1 tablet (40 mg total) by mouth daily as needed for fluid (If weight goes up by 1 pound over 24 hours or 3 pounds over a week). TAKE 1 TABLET(40 MG) BY MOUTH DAILY (Patient taking differently: Take 40 mg by mouth 3 (three) times a week. M,W,F), Disp: , Rfl:    HUMULIN N KWIKPEN 100 UNIT/ML KwikPen, Inject into the skin., Disp: , Rfl:    insulin  lispro (HUMALOG ) 100 UNIT/ML injection, Inject 0.05-0.09 mLs (5-9 Units total) into the skin 2 (two) times daily. 5 units at lunch, and 9 units after dinner., Disp: 10 mL, Rfl: 1   isosorbide  mononitrate (IMDUR ) 30 MG 24 hr tablet, Take 1 tablet (30 mg total) by mouth daily., Disp: , Rfl:    levothyroxine  (SYNTHROID ) 88 MCG tablet, Take 88 mcg by mouth daily., Disp: , Rfl:    meclizine  (ANTIVERT ) 25 MG tablet, Take 25 mg by mouth 3 (three) times daily as needed (for vertigo)., Disp: , Rfl:    metoprolol  succinate (TOPROL -XL) 50 MG 24 hr tablet, Take 1 tablet (50 mg total) by mouth in the morning and at bedtime. Take with or immediately following a meal., Disp: 60 tablet, Rfl: 3   Multiple Vitamins-Minerals (CENTRUM SILVER) tablet, Take 1 tablet by mouth daily., Disp: , Rfl:    nitroGLYCERIN  (NITROSTAT ) 0.4 MG SL tablet, Place 1 tablet (0.4 mg total) under the tongue every 5 (five) minutes as needed for up to 180 doses for chest pain., Disp: 90 tablet, Rfl: 1   ONE TOUCH ULTRA TEST test strip, , Disp: , Rfl:    ranolazine  (RANEXA ) 500 MG 12 hr tablet, TAKE 1 TABLET(500 MG) BY MOUTH TWICE DAILY, Disp: 180 tablet, Rfl: 2   rosuvastatin  (CRESTOR ) 40 MG tablet, Take 40 mg by mouth at bedtime., Disp: , Rfl:    sacubitril-valsartan  (ENTRESTO ) 24-26 MG, Take 1 tablet by mouth 2 (two) times daily.,  Disp: , Rfl:    ticagrelor  (BRILINTA ) 90 MG TABS tablet, TAKE 1 TABLET(90 MG) BY MOUTH TWICE DAILY, Disp: 180 tablet, Rfl: 3   VASCEPA  1 g capsule, TAKE 2 CAPSULES(2 GRAMS) BY MOUTH TWICE DAILY, Disp: 360 capsule, Rfl: 3  Consent:   N/A  Disposition:   He sees Dr. Collette Dayhoff, tentatively scheduled for July 17, 2024.  I will see  him back in 6 months December 2025 (which will be another 3 months after Dr. Neta Baptist appointment)  His questions and concerns were addressed to his satisfaction. He voices understanding of the recommendations provided during this encounter.   High complex medical decision making given his EKG changes, coronary artery disease status post CABG with stable angina, ischemic cardiomyopathy, chronic kidney disease stage IV/V  Signed, Awilda Bogus, [facility]  A Division of [facility] [address] [place], [place] [address]  [place], [place] [address]

## 2024-04-26 NOTE — Patient Instructions (Signed)
 Medication Instructions:  No medication changes were made at this visit. Continue current regimen.   *If you need a refill on your cardiac medications before your next appointment, please call your pharmacy*  Lab Work: None ordered today. If you have labs (blood work) drawn today and your tests are completely normal, you will receive your results only by: MyChart Message (if you have MyChart) OR A paper copy in the mail If you have any lab test that is abnormal or we need to change your treatment, we will call you to review the results.  Testing/Procedures: None ordered today.  Follow-Up: At [facility], you and your health needs are our priority.  As part of our continuing mission to provide you with exceptional heart care, our providers are all part of one team.  This team includes your primary Cardiologist (physician) and Advanced Practice Providers or APPs (Physician Assistants and Nurse Practitioners) who all work together to provide you with the care you need, when you need it.  Your next appointment:   6 month(s)  Provider:   Olinda Bertrand, DO

## 2024-04-30 DIAGNOSIS — L97821 Non-pressure chronic ulcer of other part of left lower leg limited to breakdown of skin: Secondary | ICD-10-CM | POA: Diagnosis not present

## 2024-04-30 DIAGNOSIS — E1122 Type 2 diabetes mellitus with diabetic chronic kidney disease: Secondary | ICD-10-CM | POA: Diagnosis not present

## 2024-04-30 DIAGNOSIS — I872 Venous insufficiency (chronic) (peripheral): Secondary | ICD-10-CM | POA: Diagnosis not present

## 2024-04-30 DIAGNOSIS — L97222 Non-pressure chronic ulcer of left calf with fat layer exposed: Secondary | ICD-10-CM | POA: Diagnosis not present

## 2024-04-30 DIAGNOSIS — N189 Chronic kidney disease, unspecified: Secondary | ICD-10-CM | POA: Diagnosis not present

## 2024-04-30 DIAGNOSIS — I5020 Unspecified systolic (congestive) heart failure: Secondary | ICD-10-CM | POA: Diagnosis not present

## 2024-04-30 DIAGNOSIS — E11622 Type 2 diabetes mellitus with other skin ulcer: Secondary | ICD-10-CM | POA: Diagnosis not present

## 2024-04-30 DIAGNOSIS — Z794 Long term (current) use of insulin: Secondary | ICD-10-CM | POA: Diagnosis not present

## 2024-05-01 DIAGNOSIS — S81802A Unspecified open wound, left lower leg, initial encounter: Secondary | ICD-10-CM | POA: Diagnosis not present

## 2024-05-14 DIAGNOSIS — N189 Chronic kidney disease, unspecified: Secondary | ICD-10-CM | POA: Diagnosis not present

## 2024-05-14 DIAGNOSIS — E1122 Type 2 diabetes mellitus with diabetic chronic kidney disease: Secondary | ICD-10-CM | POA: Diagnosis not present

## 2024-05-14 DIAGNOSIS — E11622 Type 2 diabetes mellitus with other skin ulcer: Secondary | ICD-10-CM | POA: Diagnosis not present

## 2024-05-14 DIAGNOSIS — L97821 Non-pressure chronic ulcer of other part of left lower leg limited to breakdown of skin: Secondary | ICD-10-CM | POA: Diagnosis not present

## 2024-05-14 DIAGNOSIS — I872 Venous insufficiency (chronic) (peripheral): Secondary | ICD-10-CM | POA: Diagnosis not present

## 2024-05-14 DIAGNOSIS — Z794 Long term (current) use of insulin: Secondary | ICD-10-CM | POA: Diagnosis not present

## 2024-05-14 DIAGNOSIS — I5020 Unspecified systolic (congestive) heart failure: Secondary | ICD-10-CM | POA: Diagnosis not present

## 2024-05-14 DIAGNOSIS — L97222 Non-pressure chronic ulcer of left calf with fat layer exposed: Secondary | ICD-10-CM | POA: Diagnosis not present

## 2024-05-15 DIAGNOSIS — N184 Chronic kidney disease, stage 4 (severe): Secondary | ICD-10-CM | POA: Diagnosis not present

## 2024-05-15 DIAGNOSIS — N179 Acute kidney failure, unspecified: Secondary | ICD-10-CM | POA: Diagnosis not present

## 2024-05-15 DIAGNOSIS — N189 Chronic kidney disease, unspecified: Secondary | ICD-10-CM | POA: Diagnosis not present

## 2024-05-15 DIAGNOSIS — D631 Anemia in chronic kidney disease: Secondary | ICD-10-CM | POA: Diagnosis not present

## 2024-05-15 DIAGNOSIS — I5022 Chronic systolic (congestive) heart failure: Secondary | ICD-10-CM | POA: Diagnosis not present

## 2024-05-15 DIAGNOSIS — I2510 Atherosclerotic heart disease of native coronary artery without angina pectoris: Secondary | ICD-10-CM | POA: Diagnosis not present

## 2024-05-15 DIAGNOSIS — I214 Non-ST elevation (NSTEMI) myocardial infarction: Secondary | ICD-10-CM | POA: Diagnosis not present

## 2024-05-15 DIAGNOSIS — E1122 Type 2 diabetes mellitus with diabetic chronic kidney disease: Secondary | ICD-10-CM | POA: Diagnosis not present

## 2024-05-15 DIAGNOSIS — I129 Hypertensive chronic kidney disease with stage 1 through stage 4 chronic kidney disease, or unspecified chronic kidney disease: Secondary | ICD-10-CM | POA: Diagnosis not present

## 2024-05-15 DIAGNOSIS — E785 Hyperlipidemia, unspecified: Secondary | ICD-10-CM | POA: Diagnosis not present

## 2024-05-16 ENCOUNTER — Other Ambulatory Visit: Payer: Self-pay | Admitting: Cardiology

## 2024-05-16 DIAGNOSIS — I25118 Atherosclerotic heart disease of native coronary artery with other forms of angina pectoris: Secondary | ICD-10-CM

## 2024-05-16 NOTE — Telephone Encounter (Signed)
 Pt of Dr. Michele requesting more Nitroglycerin .

## 2024-05-17 ENCOUNTER — Telehealth: Payer: Self-pay | Admitting: Cardiology

## 2024-05-17 DIAGNOSIS — I25118 Atherosclerotic heart disease of native coronary artery with other forms of angina pectoris: Secondary | ICD-10-CM

## 2024-05-17 MED ORDER — NITROGLYCERIN 0.4 MG SL SUBL: 0.4000 mg | SUBLINGUAL_TABLET | SUBLINGUAL | 3 refills | Status: DC | PRN

## 2024-05-17 NOTE — Telephone Encounter (Signed)
 Please advise on this RX for Dr. Sam pt. Just sent in 90days and a refill of Nitroglycerin  in December.

## 2024-05-17 NOTE — Telephone Encounter (Signed)
 Spoke with pt and explained what Dr. Michele said. Explained that we will be sending in the refills for his nitroglycerin  and to call us  with any questions or concerns. Pt verbalized understanding of plan and had no further questions at this time.

## 2024-05-17 NOTE — Telephone Encounter (Signed)
*  STAT* If patient is at the pharmacy, call can be transferred to refill team.   1. Which medications need to be refilled? (please list name of each medication and dose if known)   nitroGLYCERIN  (NITROSTAT ) 0.4 MG SL tablet   4. Which pharmacy/location (including street and city if local pharmacy) is medication to be sent to?  [id number] - [place], [place] - [address] AT [facility]  OF [address]     5. Do they need a 30 day or 90 day supply? 100, if possible

## 2024-05-17 NOTE — Telephone Encounter (Signed)
 Spoke with pt over the phone and asked if he had meant to request a refill for nitroglycerin . Pt stated he still had 1 bottle left but was worried about running out. Asked pt if he was taking the medication daily as a scheduled medication or only as needed. Pt stated only as needed but that is just about every day. Pt stated during the week when he takes his Lasix , it isn't as bad and doesn't have as many episodes but on the weekends he does d/t not taking his Lasix . Pt stated on Sunday going into Monday (between 4pm to 3:30 AM) he took approximately 6 tablets. Advised pt to only take a maximum of 3 tablets at one time and if he feels he needs to take a 2nd tablet to go ahead and either call 911 or get evaluated. Pt stated that these episodes weren't back to back and he had split up the tablets. Explained to pt that I am going to wait to discuss this with Dr. Michele prior to refilling any medications to see what his recommendations are. Pt verbalized understanding and had no other questions or concerns at this time.

## 2024-05-17 NOTE — Telephone Encounter (Signed)
 Please refill.   You are right, but Mr. Shapley has stable angina and at times he has verbalized its a routine to take one nitroglycerin  at night.   He has progressive CKD so has not elected to do his cath unless he continues to have pain refractory to medical therapy.   I agree educating him that if his pain worsens in intensity, frequency, or duration he should  call 911 to go to the closest hospital.   Madonna Large, DO, FACC

## 2024-05-28 DIAGNOSIS — S81802A Unspecified open wound, left lower leg, initial encounter: Secondary | ICD-10-CM | POA: Diagnosis not present

## 2024-05-28 DIAGNOSIS — I2510 Atherosclerotic heart disease of native coronary artery without angina pectoris: Secondary | ICD-10-CM | POA: Diagnosis not present

## 2024-05-28 DIAGNOSIS — E1122 Type 2 diabetes mellitus with diabetic chronic kidney disease: Secondary | ICD-10-CM | POA: Diagnosis not present

## 2024-05-28 DIAGNOSIS — Z79899 Other long term (current) drug therapy: Secondary | ICD-10-CM | POA: Diagnosis not present

## 2024-05-28 DIAGNOSIS — N189 Chronic kidney disease, unspecified: Secondary | ICD-10-CM | POA: Diagnosis not present

## 2024-05-28 DIAGNOSIS — I252 Old myocardial infarction: Secondary | ICD-10-CM | POA: Diagnosis not present

## 2024-05-28 DIAGNOSIS — L97829 Non-pressure chronic ulcer of other part of left lower leg with unspecified severity: Secondary | ICD-10-CM | POA: Diagnosis not present

## 2024-05-28 DIAGNOSIS — E785 Hyperlipidemia, unspecified: Secondary | ICD-10-CM | POA: Diagnosis not present

## 2024-05-28 DIAGNOSIS — L97222 Non-pressure chronic ulcer of left calf with fat layer exposed: Secondary | ICD-10-CM | POA: Diagnosis not present

## 2024-05-28 DIAGNOSIS — I5022 Chronic systolic (congestive) heart failure: Secondary | ICD-10-CM | POA: Diagnosis not present

## 2024-05-28 DIAGNOSIS — I87312 Chronic venous hypertension (idiopathic) with ulcer of left lower extremity: Secondary | ICD-10-CM | POA: Diagnosis not present

## 2024-05-28 DIAGNOSIS — I872 Venous insufficiency (chronic) (peripheral): Secondary | ICD-10-CM | POA: Diagnosis not present

## 2024-05-28 DIAGNOSIS — Z794 Long term (current) use of insulin: Secondary | ICD-10-CM | POA: Diagnosis not present

## 2024-06-05 ENCOUNTER — Ambulatory Visit: Admitting: Podiatry

## 2024-06-11 DIAGNOSIS — Z79899 Other long term (current) drug therapy: Secondary | ICD-10-CM | POA: Diagnosis not present

## 2024-06-11 DIAGNOSIS — L97222 Non-pressure chronic ulcer of left calf with fat layer exposed: Secondary | ICD-10-CM | POA: Diagnosis not present

## 2024-06-11 DIAGNOSIS — L97829 Non-pressure chronic ulcer of other part of left lower leg with unspecified severity: Secondary | ICD-10-CM | POA: Diagnosis not present

## 2024-06-11 DIAGNOSIS — I2510 Atherosclerotic heart disease of native coronary artery without angina pectoris: Secondary | ICD-10-CM | POA: Diagnosis not present

## 2024-06-11 DIAGNOSIS — I872 Venous insufficiency (chronic) (peripheral): Secondary | ICD-10-CM | POA: Diagnosis not present

## 2024-06-11 DIAGNOSIS — N189 Chronic kidney disease, unspecified: Secondary | ICD-10-CM | POA: Diagnosis not present

## 2024-06-11 DIAGNOSIS — E1122 Type 2 diabetes mellitus with diabetic chronic kidney disease: Secondary | ICD-10-CM | POA: Diagnosis not present

## 2024-06-11 DIAGNOSIS — E785 Hyperlipidemia, unspecified: Secondary | ICD-10-CM | POA: Diagnosis not present

## 2024-06-11 DIAGNOSIS — I5022 Chronic systolic (congestive) heart failure: Secondary | ICD-10-CM | POA: Diagnosis not present

## 2024-06-11 DIAGNOSIS — Z794 Long term (current) use of insulin: Secondary | ICD-10-CM | POA: Diagnosis not present

## 2024-06-11 DIAGNOSIS — I252 Old myocardial infarction: Secondary | ICD-10-CM | POA: Diagnosis not present

## 2024-06-11 DIAGNOSIS — I87312 Chronic venous hypertension (idiopathic) with ulcer of left lower extremity: Secondary | ICD-10-CM | POA: Diagnosis not present

## 2024-06-19 ENCOUNTER — Ambulatory Visit: Admitting: Podiatry

## 2024-06-26 ENCOUNTER — Other Ambulatory Visit: Payer: Self-pay | Admitting: Cardiology

## 2024-06-26 ENCOUNTER — Encounter: Payer: Self-pay | Admitting: Podiatry

## 2024-06-26 ENCOUNTER — Ambulatory Visit: Admitting: Podiatry

## 2024-06-26 DIAGNOSIS — N401 Enlarged prostate with lower urinary tract symptoms: Secondary | ICD-10-CM | POA: Insufficient documentation

## 2024-06-26 DIAGNOSIS — M79676 Pain in unspecified toe(s): Secondary | ICD-10-CM

## 2024-06-26 DIAGNOSIS — D689 Coagulation defect, unspecified: Secondary | ICD-10-CM

## 2024-06-26 DIAGNOSIS — D2371 Other benign neoplasm of skin of right lower limb, including hip: Secondary | ICD-10-CM | POA: Diagnosis not present

## 2024-06-26 DIAGNOSIS — L97921 Non-pressure chronic ulcer of unspecified part of left lower leg limited to breakdown of skin: Secondary | ICD-10-CM | POA: Insufficient documentation

## 2024-06-26 DIAGNOSIS — M7752 Other enthesopathy of left foot: Secondary | ICD-10-CM | POA: Diagnosis not present

## 2024-06-26 DIAGNOSIS — B351 Tinea unguium: Secondary | ICD-10-CM

## 2024-06-26 DIAGNOSIS — L97929 Non-pressure chronic ulcer of unspecified part of left lower leg with unspecified severity: Secondary | ICD-10-CM | POA: Insufficient documentation

## 2024-06-26 DIAGNOSIS — Z794 Long term (current) use of insulin: Secondary | ICD-10-CM | POA: Insufficient documentation

## 2024-06-26 DIAGNOSIS — I130 Hypertensive heart and chronic kidney disease with heart failure and stage 1 through stage 4 chronic kidney disease, or unspecified chronic kidney disease: Secondary | ICD-10-CM | POA: Insufficient documentation

## 2024-06-26 DIAGNOSIS — D2372 Other benign neoplasm of skin of left lower limb, including hip: Secondary | ICD-10-CM | POA: Diagnosis not present

## 2024-06-26 DIAGNOSIS — E119 Type 2 diabetes mellitus without complications: Secondary | ICD-10-CM | POA: Diagnosis not present

## 2024-06-26 DIAGNOSIS — I25118 Atherosclerotic heart disease of native coronary artery with other forms of angina pectoris: Secondary | ICD-10-CM

## 2024-06-26 MED ORDER — DEXAMETHASONE SODIUM PHOSPHATE 120 MG/30ML IJ SOLN
2.0000 mg | Freq: Once | INTRAMUSCULAR | Status: AC
  Administered 2024-06-26 (×2): 2 mg via INTRA_ARTICULAR

## 2024-06-27 NOTE — Progress Notes (Signed)
 He presents today chief complaint of painful callus to the plantar aspect of his left foot.  He is also complaining of elongated toenails.  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  Foot is cool to the touch but no mottling.  Capillary fill time is immediate.  He does have soft tissue swelling subfifth metatarsal head of the left foot with an overlying benign reactive hyperkeratotic lesion.  Assessment: Pain in limb secondary to benign skin lesion and painful elongated nails.  Plan: Debridement of toenails 1 through 5 bilateral.  I injected the bursa today with 4 mg of Kenalog tolerated procedure well without complications.  These were injected beneath the fifth metatarsal head bilaterally.

## 2024-07-14 ENCOUNTER — Other Ambulatory Visit: Payer: Self-pay | Admitting: Cardiology

## 2024-07-14 DIAGNOSIS — I25118 Atherosclerotic heart disease of native coronary artery with other forms of angina pectoris: Secondary | ICD-10-CM

## 2024-07-17 DIAGNOSIS — E782 Mixed hyperlipidemia: Secondary | ICD-10-CM | POA: Diagnosis not present

## 2024-07-17 DIAGNOSIS — N184 Chronic kidney disease, stage 4 (severe): Secondary | ICD-10-CM | POA: Diagnosis not present

## 2024-07-17 DIAGNOSIS — I209 Angina pectoris, unspecified: Secondary | ICD-10-CM | POA: Diagnosis not present

## 2024-07-17 DIAGNOSIS — I5042 Chronic combined systolic (congestive) and diastolic (congestive) heart failure: Secondary | ICD-10-CM | POA: Diagnosis not present

## 2024-07-25 DIAGNOSIS — I129 Hypertensive chronic kidney disease with stage 1 through stage 4 chronic kidney disease, or unspecified chronic kidney disease: Secondary | ICD-10-CM | POA: Diagnosis not present

## 2024-07-25 DIAGNOSIS — E1122 Type 2 diabetes mellitus with diabetic chronic kidney disease: Secondary | ICD-10-CM | POA: Diagnosis not present

## 2024-07-25 DIAGNOSIS — D631 Anemia in chronic kidney disease: Secondary | ICD-10-CM | POA: Diagnosis not present

## 2024-07-25 DIAGNOSIS — I2510 Atherosclerotic heart disease of native coronary artery without angina pectoris: Secondary | ICD-10-CM | POA: Diagnosis not present

## 2024-07-25 DIAGNOSIS — I214 Non-ST elevation (NSTEMI) myocardial infarction: Secondary | ICD-10-CM | POA: Diagnosis not present

## 2024-07-25 DIAGNOSIS — E785 Hyperlipidemia, unspecified: Secondary | ICD-10-CM | POA: Diagnosis not present

## 2024-07-25 DIAGNOSIS — I5022 Chronic systolic (congestive) heart failure: Secondary | ICD-10-CM | POA: Diagnosis not present

## 2024-07-25 DIAGNOSIS — N179 Acute kidney failure, unspecified: Secondary | ICD-10-CM | POA: Diagnosis not present

## 2024-07-25 DIAGNOSIS — N184 Chronic kidney disease, stage 4 (severe): Secondary | ICD-10-CM | POA: Diagnosis not present

## 2024-07-26 ENCOUNTER — Other Ambulatory Visit: Payer: Self-pay | Admitting: Cardiology

## 2024-07-30 DIAGNOSIS — Z23 Encounter for immunization: Secondary | ICD-10-CM | POA: Diagnosis not present

## 2024-08-28 ENCOUNTER — Inpatient Hospital Stay

## 2024-08-28 ENCOUNTER — Inpatient Hospital Stay: Admitting: Hematology and Oncology

## 2024-08-30 ENCOUNTER — Ambulatory Visit (INDEPENDENT_AMBULATORY_CARE_PROVIDER_SITE_OTHER): Admitting: Podiatry

## 2024-08-30 DIAGNOSIS — M7752 Other enthesopathy of left foot: Secondary | ICD-10-CM | POA: Diagnosis not present

## 2024-08-30 DIAGNOSIS — M79676 Pain in unspecified toe(s): Secondary | ICD-10-CM

## 2024-08-30 DIAGNOSIS — B351 Tinea unguium: Secondary | ICD-10-CM | POA: Diagnosis not present

## 2024-08-30 DIAGNOSIS — M7751 Other enthesopathy of right foot: Secondary | ICD-10-CM

## 2024-08-30 DIAGNOSIS — D2372 Other benign neoplasm of skin of left lower limb, including hip: Secondary | ICD-10-CM | POA: Diagnosis not present

## 2024-08-30 MED ORDER — DEXAMETHASONE SODIUM PHOSPHATE 120 MG/30ML IJ SOLN
2.0000 mg | Freq: Once | INTRAMUSCULAR | Status: AC
  Administered 2024-08-30: 2 mg via INTRA_ARTICULAR

## 2024-08-30 NOTE — Progress Notes (Signed)
 He presents today chief complaint of painful elongated toenails and a painful area beneath the fifth metatarsal head of the left foot.  Objective: Vitals are stable as pulses are palpable.  His toenails are thick yellow dystrophic clinically mycotic he has painful benign neoplasm subfifth metatarsal head of the left foot with an underlying area of fluctuance does not appear to be infection most likely bursitis.  Assessment: Bursitis subfifth met head left overlying benign skin lesion.  Toenails which are painful and mycotic.   plan: Discussed etiology pathology conservative versus surgical therapies at this point I highly recommend injecting the bursa with dexamethasone  and a total of 2 mg was injected.  Debrided the benign skin lesion and debrided toenails 1 through 5 bilateral.

## 2024-09-01 ENCOUNTER — Other Ambulatory Visit: Payer: Self-pay | Admitting: Cardiology

## 2024-09-01 DIAGNOSIS — I25118 Atherosclerotic heart disease of native coronary artery with other forms of angina pectoris: Secondary | ICD-10-CM

## 2024-09-01 DIAGNOSIS — Z951 Presence of aortocoronary bypass graft: Secondary | ICD-10-CM

## 2024-09-01 DIAGNOSIS — Z955 Presence of coronary angioplasty implant and graft: Secondary | ICD-10-CM

## 2024-09-07 ENCOUNTER — Other Ambulatory Visit: Payer: Self-pay | Admitting: Cardiology

## 2024-09-07 DIAGNOSIS — I5022 Chronic systolic (congestive) heart failure: Secondary | ICD-10-CM

## 2024-09-25 ENCOUNTER — Other Ambulatory Visit: Payer: Self-pay | Admitting: Cardiology

## 2024-10-25 ENCOUNTER — Ambulatory Visit: Admitting: Podiatry

## 2024-10-30 ENCOUNTER — Encounter: Payer: Self-pay | Admitting: Podiatry

## 2024-10-30 ENCOUNTER — Ambulatory Visit: Admitting: Podiatry

## 2024-10-30 DIAGNOSIS — M79676 Pain in unspecified toe(s): Secondary | ICD-10-CM | POA: Diagnosis not present

## 2024-10-30 DIAGNOSIS — D689 Coagulation defect, unspecified: Secondary | ICD-10-CM | POA: Diagnosis not present

## 2024-10-30 DIAGNOSIS — M7751 Other enthesopathy of right foot: Secondary | ICD-10-CM | POA: Diagnosis not present

## 2024-10-30 DIAGNOSIS — E119 Type 2 diabetes mellitus without complications: Secondary | ICD-10-CM | POA: Diagnosis not present

## 2024-10-30 DIAGNOSIS — D2372 Other benign neoplasm of skin of left lower limb, including hip: Secondary | ICD-10-CM

## 2024-10-30 DIAGNOSIS — M7752 Other enthesopathy of left foot: Secondary | ICD-10-CM

## 2024-10-30 DIAGNOSIS — D2371 Other benign neoplasm of skin of right lower limb, including hip: Secondary | ICD-10-CM | POA: Diagnosis not present

## 2024-10-30 DIAGNOSIS — B351 Tinea unguium: Secondary | ICD-10-CM | POA: Diagnosis not present

## 2024-10-30 MED ORDER — DEXAMETHASONE SODIUM PHOSPHATE 120 MG/30ML IJ SOLN
4.0000 mg | Freq: Once | INTRAMUSCULAR | Status: AC
  Administered 2024-10-30: 10:00:00 4 mg via INTRA_ARTICULAR

## 2024-10-30 NOTE — Progress Notes (Signed)
 He presents today complaining of painful lesions beneath the fifth metatarsals bilaterally.  Also beneath the first.  States that his toenails are long and painful.  Objective: Vital signs are stable alert and oriented x 3.  Pulses are palpable.  He has benign skin lesions plantar aspect of the forefoot subfirst subfifth bilateral.  Exquisitely tender on palpation.  Fluctuance beneath these consistent with bursitis.  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  Assessment: Pain in limb secondary to bursitis benign skin lesions subfifth bilateral and painful mycotic nails.  Plan: I injected 4 mg of dexamethasone  to the lesions plantarly into the bursa.  I also debrided the benign skin lesions thoroughly 1st and 5th bilateral.  Debrided toenails 1 through 5 bilaterally.  Follow-up with him in 2 months.

## 2024-10-31 ENCOUNTER — Other Ambulatory Visit: Payer: Self-pay | Admitting: Cardiology

## 2025-01-01 ENCOUNTER — Ambulatory Visit: Admitting: Podiatry
